# Patient Record
Sex: Female | Born: 1940 | Race: White | Hispanic: No | Smoking: Never smoker
Health system: Southern US, Community
[De-identification: ages and names within clinical notes are randomized; demographics above are authoritative.]

## PROBLEM LIST (undated history)

## (undated) DIAGNOSIS — Z860100 Personal history of colon polyps, unspecified: Secondary | ICD-10-CM

## (undated) DIAGNOSIS — N200 Calculus of kidney: Secondary | ICD-10-CM

## (undated) DIAGNOSIS — K121 Other forms of stomatitis: Secondary | ICD-10-CM

## (undated) DIAGNOSIS — M1990 Unspecified osteoarthritis, unspecified site: Secondary | ICD-10-CM

## (undated) DIAGNOSIS — N183 Chronic kidney disease, stage 3 (moderate): Secondary | ICD-10-CM

## (undated) DIAGNOSIS — K5790 Diverticulosis of intestine, part unspecified, without perforation or abscess without bleeding: Secondary | ICD-10-CM

## (undated) DIAGNOSIS — E785 Hyperlipidemia, unspecified: Secondary | ICD-10-CM

## (undated) DIAGNOSIS — E119 Type 2 diabetes mellitus without complications: Secondary | ICD-10-CM

## (undated) DIAGNOSIS — I509 Heart failure, unspecified: Secondary | ICD-10-CM

## (undated) DIAGNOSIS — I10 Essential (primary) hypertension: Secondary | ICD-10-CM

## (undated) DIAGNOSIS — I071 Rheumatic tricuspid insufficiency: Secondary | ICD-10-CM

## (undated) DIAGNOSIS — M48061 Spinal stenosis, lumbar region without neurogenic claudication: Secondary | ICD-10-CM

## (undated) DIAGNOSIS — I739 Peripheral vascular disease, unspecified: Secondary | ICD-10-CM

## (undated) DIAGNOSIS — K5730 Diverticulosis of large intestine without perforation or abscess without bleeding: Secondary | ICD-10-CM

## (undated) DIAGNOSIS — I219 Acute myocardial infarction, unspecified: Secondary | ICD-10-CM

## (undated) DIAGNOSIS — M8580 Other specified disorders of bone density and structure, unspecified site: Secondary | ICD-10-CM

## (undated) DIAGNOSIS — L409 Psoriasis, unspecified: Secondary | ICD-10-CM

## (undated) DIAGNOSIS — I252 Old myocardial infarction: Secondary | ICD-10-CM

## (undated) DIAGNOSIS — I2510 Atherosclerotic heart disease of native coronary artery without angina pectoris: Secondary | ICD-10-CM

## (undated) DIAGNOSIS — N184 Chronic kidney disease, stage 4 (severe): Secondary | ICD-10-CM

## (undated) DIAGNOSIS — M654 Radial styloid tenosynovitis [de Quervain]: Secondary | ICD-10-CM

## (undated) HISTORY — DX: Type 2 diabetes mellitus without complications: E11.9

## (undated) HISTORY — DX: Calculus of kidney: N20.0

## (undated) HISTORY — DX: Diverticulosis of large intestine without perforation or abscess without bleeding: K57.30

## (undated) HISTORY — DX: Essential (primary) hypertension: I10

## (undated) HISTORY — DX: Personal history of colonic polyps: Z86.010

## (undated) HISTORY — PX: LAPAROSCOPIC SUPRACERVICAL HYSTERECTOMY: SUR797

## (undated) HISTORY — DX: Old myocardial infarction: I25.2

## (undated) HISTORY — DX: Peripheral vascular disease, unspecified: I73.9

## (undated) HISTORY — DX: Acute myocardial infarction, unspecified: I21.9

## (undated) HISTORY — PX: BREAST BIOPSY: SHX20

## (undated) HISTORY — DX: Diverticulosis of intestine, part unspecified, without perforation or abscess without bleeding: K57.90

## (undated) HISTORY — DX: Personal history of colon polyps, unspecified: Z86.0100

## (undated) HISTORY — PX: BYPASS GRAFT ANGIOGRAPHY: CATH118229

## (undated) HISTORY — DX: Rheumatic tricuspid insufficiency: I07.1

## (undated) HISTORY — PX: ABDOMINAL HYSTERECTOMY: SHX81

## (undated) HISTORY — DX: Chronic kidney disease, stage 3 (moderate): N18.3

## (undated) HISTORY — DX: Atherosclerotic heart disease of native coronary artery without angina pectoris: I25.10

## (undated) HISTORY — DX: Other specified disorders of bone density and structure, unspecified site: M85.80

## (undated) HISTORY — DX: Hyperlipidemia, unspecified: E78.5

## (undated) HISTORY — DX: Chronic kidney disease, stage 4 (severe): N18.4

## (undated) HISTORY — DX: Other forms of stomatitis: K12.1

## (undated) HISTORY — DX: Heart failure, unspecified: I50.9

## (undated) HISTORY — DX: Unspecified osteoarthritis, unspecified site: M19.90

## (undated) HISTORY — DX: Spinal stenosis, lumbar region without neurogenic claudication: M48.061

## (undated) HISTORY — DX: Psoriasis, unspecified: L40.9

---
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
## 1994-04-14 ENCOUNTER — Encounter (INDEPENDENT_AMBULATORY_CARE_PROVIDER_SITE_OTHER): Payer: Self-pay | Admitting: Internal Medicine

## 1997-05-18 ENCOUNTER — Encounter: Admission: RE | Admit: 1997-05-18 | Discharge: 1997-05-18 | Payer: Self-pay | Admitting: Hematology and Oncology

## 1998-02-21 ENCOUNTER — Encounter: Admission: RE | Admit: 1998-02-21 | Discharge: 1998-02-21 | Payer: Self-pay | Admitting: Internal Medicine

## 1998-03-26 ENCOUNTER — Ambulatory Visit (HOSPITAL_COMMUNITY): Admission: RE | Admit: 1998-03-26 | Discharge: 1998-03-26 | Payer: Self-pay | Admitting: Internal Medicine

## 1998-03-26 ENCOUNTER — Encounter: Payer: Self-pay | Admitting: Internal Medicine

## 1999-06-11 ENCOUNTER — Encounter: Admission: RE | Admit: 1999-06-11 | Discharge: 1999-06-11 | Payer: Self-pay | Admitting: Internal Medicine

## 2000-08-05 ENCOUNTER — Encounter: Admission: RE | Admit: 2000-08-05 | Discharge: 2000-08-05 | Payer: Self-pay | Admitting: Internal Medicine

## 2000-08-27 ENCOUNTER — Ambulatory Visit (HOSPITAL_COMMUNITY): Admission: RE | Admit: 2000-08-27 | Discharge: 2000-08-27 | Payer: Self-pay | Admitting: Internal Medicine

## 2000-08-27 ENCOUNTER — Encounter: Payer: Self-pay | Admitting: Internal Medicine

## 2001-01-13 DIAGNOSIS — I219 Acute myocardial infarction, unspecified: Secondary | ICD-10-CM

## 2001-01-13 HISTORY — DX: Acute myocardial infarction, unspecified: I21.9

## 2001-01-13 HISTORY — PX: CORONARY ANGIOPLASTY WITH STENT PLACEMENT: SHX49

## 2001-09-15 ENCOUNTER — Encounter: Admission: RE | Admit: 2001-09-15 | Discharge: 2001-09-15 | Payer: Self-pay | Admitting: Internal Medicine

## 2001-10-04 ENCOUNTER — Encounter: Admission: RE | Admit: 2001-10-04 | Discharge: 2001-10-04 | Payer: Self-pay | Admitting: Internal Medicine

## 2001-10-18 ENCOUNTER — Encounter: Admission: RE | Admit: 2001-10-18 | Discharge: 2001-10-18 | Payer: Self-pay | Admitting: Internal Medicine

## 2001-11-11 ENCOUNTER — Ambulatory Visit (HOSPITAL_COMMUNITY): Admission: RE | Admit: 2001-11-11 | Discharge: 2001-11-11 | Payer: Self-pay | Admitting: Internal Medicine

## 2001-12-01 ENCOUNTER — Encounter: Admission: RE | Admit: 2001-12-01 | Discharge: 2001-12-01 | Payer: Self-pay | Admitting: Internal Medicine

## 2002-02-02 ENCOUNTER — Encounter: Admission: RE | Admit: 2002-02-02 | Discharge: 2002-02-02 | Payer: Self-pay | Admitting: Internal Medicine

## 2002-04-04 ENCOUNTER — Encounter: Admission: RE | Admit: 2002-04-04 | Discharge: 2002-04-04 | Payer: Self-pay | Admitting: Internal Medicine

## 2002-05-30 ENCOUNTER — Encounter: Admission: RE | Admit: 2002-05-30 | Discharge: 2002-05-30 | Payer: Self-pay | Admitting: Internal Medicine

## 2002-05-31 ENCOUNTER — Ambulatory Visit (HOSPITAL_COMMUNITY): Admission: RE | Admit: 2002-05-31 | Discharge: 2002-05-31 | Payer: Self-pay | Admitting: Internal Medicine

## 2002-08-01 ENCOUNTER — Encounter: Admission: RE | Admit: 2002-08-01 | Discharge: 2002-08-01 | Payer: Self-pay | Admitting: Internal Medicine

## 2002-09-12 ENCOUNTER — Encounter: Admission: RE | Admit: 2002-09-12 | Discharge: 2002-09-12 | Payer: Self-pay | Admitting: Internal Medicine

## 2002-10-19 ENCOUNTER — Encounter: Admission: RE | Admit: 2002-10-19 | Discharge: 2002-10-19 | Payer: Self-pay | Admitting: Internal Medicine

## 2003-01-19 ENCOUNTER — Encounter: Admission: RE | Admit: 2003-01-19 | Discharge: 2003-01-19 | Payer: Self-pay | Admitting: Internal Medicine

## 2003-04-21 ENCOUNTER — Encounter: Admission: RE | Admit: 2003-04-21 | Discharge: 2003-04-21 | Payer: Self-pay | Admitting: Internal Medicine

## 2003-06-28 ENCOUNTER — Ambulatory Visit (HOSPITAL_COMMUNITY): Admission: RE | Admit: 2003-06-28 | Discharge: 2003-06-28 | Payer: Self-pay | Admitting: Internal Medicine

## 2003-08-21 ENCOUNTER — Encounter: Admission: RE | Admit: 2003-08-21 | Discharge: 2003-08-21 | Payer: Self-pay | Admitting: Internal Medicine

## 2003-08-24 ENCOUNTER — Encounter: Admission: RE | Admit: 2003-08-24 | Discharge: 2003-08-24 | Payer: Self-pay | Admitting: Internal Medicine

## 2003-09-07 ENCOUNTER — Encounter (INDEPENDENT_AMBULATORY_CARE_PROVIDER_SITE_OTHER): Payer: Self-pay | Admitting: Internal Medicine

## 2003-09-07 ENCOUNTER — Encounter: Admission: RE | Admit: 2003-09-07 | Discharge: 2003-09-07 | Payer: Self-pay | Admitting: Internal Medicine

## 2003-10-04 ENCOUNTER — Ambulatory Visit: Payer: Self-pay | Admitting: Internal Medicine

## 2003-11-20 ENCOUNTER — Ambulatory Visit: Payer: Self-pay | Admitting: Internal Medicine

## 2004-01-14 DIAGNOSIS — N183 Chronic kidney disease, stage 3 (moderate): Secondary | ICD-10-CM

## 2004-01-14 DIAGNOSIS — E1122 Type 2 diabetes mellitus with diabetic chronic kidney disease: Secondary | ICD-10-CM

## 2004-01-14 DIAGNOSIS — I2510 Atherosclerotic heart disease of native coronary artery without angina pectoris: Secondary | ICD-10-CM

## 2004-01-14 DIAGNOSIS — E118 Type 2 diabetes mellitus with unspecified complications: Secondary | ICD-10-CM

## 2004-01-14 DIAGNOSIS — Z794 Long term (current) use of insulin: Secondary | ICD-10-CM

## 2004-01-14 HISTORY — DX: Type 2 diabetes mellitus with diabetic chronic kidney disease: E11.22

## 2004-01-14 HISTORY — DX: Atherosclerotic heart disease of native coronary artery without angina pectoris: I25.10

## 2004-01-14 HISTORY — PX: CORONARY ARTERY BYPASS GRAFT: SHX141

## 2004-01-14 HISTORY — DX: Type 2 diabetes mellitus with unspecified complications: E11.8

## 2004-02-26 ENCOUNTER — Ambulatory Visit: Payer: Self-pay | Admitting: Internal Medicine

## 2004-02-26 ENCOUNTER — Ambulatory Visit (HOSPITAL_COMMUNITY): Admission: RE | Admit: 2004-02-26 | Discharge: 2004-02-26 | Payer: Self-pay | Admitting: Internal Medicine

## 2004-03-01 ENCOUNTER — Ambulatory Visit (HOSPITAL_COMMUNITY): Admission: RE | Admit: 2004-03-01 | Discharge: 2004-03-01 | Payer: Self-pay | Admitting: Cardiovascular Disease

## 2004-03-22 ENCOUNTER — Ambulatory Visit: Payer: Self-pay | Admitting: Internal Medicine

## 2004-03-26 ENCOUNTER — Ambulatory Visit: Payer: Self-pay | Admitting: Internal Medicine

## 2004-06-28 ENCOUNTER — Ambulatory Visit (HOSPITAL_COMMUNITY): Admission: RE | Admit: 2004-06-28 | Discharge: 2004-06-28 | Payer: Self-pay | Admitting: Internal Medicine

## 2004-07-01 ENCOUNTER — Ambulatory Visit: Payer: Self-pay | Admitting: Internal Medicine

## 2004-08-27 ENCOUNTER — Ambulatory Visit: Payer: Self-pay | Admitting: Internal Medicine

## 2004-09-10 ENCOUNTER — Ambulatory Visit: Payer: Self-pay | Admitting: Internal Medicine

## 2004-10-22 ENCOUNTER — Ambulatory Visit: Payer: Self-pay | Admitting: Hospitalist

## 2004-12-13 ENCOUNTER — Inpatient Hospital Stay (HOSPITAL_COMMUNITY): Admission: RE | Admit: 2004-12-13 | Discharge: 2004-12-20 | Payer: Self-pay | Admitting: Cardiovascular Disease

## 2004-12-13 ENCOUNTER — Ambulatory Visit: Payer: Self-pay | Admitting: Hospitalist

## 2005-01-16 ENCOUNTER — Ambulatory Visit (HOSPITAL_COMMUNITY): Admission: RE | Admit: 2005-01-16 | Discharge: 2005-01-16 | Payer: Self-pay | Admitting: Cardiothoracic Surgery

## 2005-01-16 ENCOUNTER — Encounter: Admission: RE | Admit: 2005-01-16 | Discharge: 2005-01-16 | Payer: Self-pay | Admitting: Cardiothoracic Surgery

## 2005-01-23 ENCOUNTER — Encounter: Admission: RE | Admit: 2005-01-23 | Discharge: 2005-01-23 | Payer: Self-pay | Admitting: Cardiothoracic Surgery

## 2005-02-06 ENCOUNTER — Encounter: Admission: RE | Admit: 2005-02-06 | Discharge: 2005-02-06 | Payer: Self-pay | Admitting: Cardiothoracic Surgery

## 2005-02-24 ENCOUNTER — Ambulatory Visit: Payer: Self-pay | Admitting: Internal Medicine

## 2005-03-13 ENCOUNTER — Encounter: Admission: RE | Admit: 2005-03-13 | Discharge: 2005-03-13 | Payer: Self-pay | Admitting: Cardiothoracic Surgery

## 2005-07-02 ENCOUNTER — Ambulatory Visit: Payer: Self-pay | Admitting: Internal Medicine

## 2005-07-08 ENCOUNTER — Ambulatory Visit: Payer: Self-pay | Admitting: Internal Medicine

## 2005-08-05 ENCOUNTER — Ambulatory Visit (HOSPITAL_COMMUNITY): Admission: RE | Admit: 2005-08-05 | Discharge: 2005-08-05 | Payer: Self-pay | Admitting: Cardiothoracic Surgery

## 2005-08-05 ENCOUNTER — Encounter (INDEPENDENT_AMBULATORY_CARE_PROVIDER_SITE_OTHER): Payer: Self-pay | Admitting: Internal Medicine

## 2005-10-07 ENCOUNTER — Ambulatory Visit: Payer: Self-pay | Admitting: Internal Medicine

## 2005-10-13 ENCOUNTER — Ambulatory Visit: Payer: Self-pay | Admitting: Internal Medicine

## 2005-10-15 ENCOUNTER — Ambulatory Visit (HOSPITAL_COMMUNITY): Admission: RE | Admit: 2005-10-15 | Discharge: 2005-10-15 | Payer: Self-pay | Admitting: Internal Medicine

## 2005-10-15 DIAGNOSIS — E669 Obesity, unspecified: Secondary | ICD-10-CM

## 2005-10-15 DIAGNOSIS — E785 Hyperlipidemia, unspecified: Secondary | ICD-10-CM

## 2005-10-15 DIAGNOSIS — L409 Psoriasis, unspecified: Secondary | ICD-10-CM | POA: Insufficient documentation

## 2005-10-15 DIAGNOSIS — N259 Disorder resulting from impaired renal tubular function, unspecified: Secondary | ICD-10-CM | POA: Insufficient documentation

## 2005-10-15 DIAGNOSIS — M810 Age-related osteoporosis without current pathological fracture: Secondary | ICD-10-CM | POA: Insufficient documentation

## 2005-10-15 DIAGNOSIS — M8580 Other specified disorders of bone density and structure, unspecified site: Secondary | ICD-10-CM

## 2005-10-15 DIAGNOSIS — Z951 Presence of aortocoronary bypass graft: Secondary | ICD-10-CM

## 2005-10-15 DIAGNOSIS — Z9079 Acquired absence of other genital organ(s): Secondary | ICD-10-CM | POA: Insufficient documentation

## 2005-10-15 DIAGNOSIS — I739 Peripheral vascular disease, unspecified: Secondary | ICD-10-CM

## 2005-10-15 HISTORY — DX: Hyperlipidemia, unspecified: E78.5

## 2005-12-23 DIAGNOSIS — M47815 Spondylosis without myelopathy or radiculopathy, thoracolumbar region: Secondary | ICD-10-CM

## 2006-01-13 HISTORY — PX: CATARACT EXTRACTION: SUR2

## 2006-03-03 ENCOUNTER — Ambulatory Visit: Payer: Self-pay | Admitting: Internal Medicine

## 2006-03-03 ENCOUNTER — Ambulatory Visit (HOSPITAL_COMMUNITY): Admission: RE | Admit: 2006-03-03 | Discharge: 2006-03-03 | Payer: Self-pay | Admitting: Internal Medicine

## 2006-03-03 DIAGNOSIS — M25559 Pain in unspecified hip: Secondary | ICD-10-CM

## 2006-03-03 DIAGNOSIS — R1310 Dysphagia, unspecified: Secondary | ICD-10-CM | POA: Insufficient documentation

## 2006-03-03 LAB — CONVERTED CEMR LAB
CO2: 30 meq/L (ref 19–32)
Calcium: 10 mg/dL (ref 8.4–10.5)
Creatinine, Ser: 1.56 mg/dL — ABNORMAL HIGH (ref 0.40–1.20)
Glucose, Bld: 221 mg/dL
Hgb A1c MFr Bld: 8 %
Sodium: 141 meq/L (ref 135–145)

## 2006-03-03 IMAGING — CR DG HIP W/ PELVIS BILAT
3 series · 3 of 3 positions shown · non-contrast
Comparison: None.

CLINICAL DATA: Pain in hips when walking for a couple of months.
 BILATERAL HIPS W/PELVIS ? 3 VIEW:

[t pelvis a.p.]
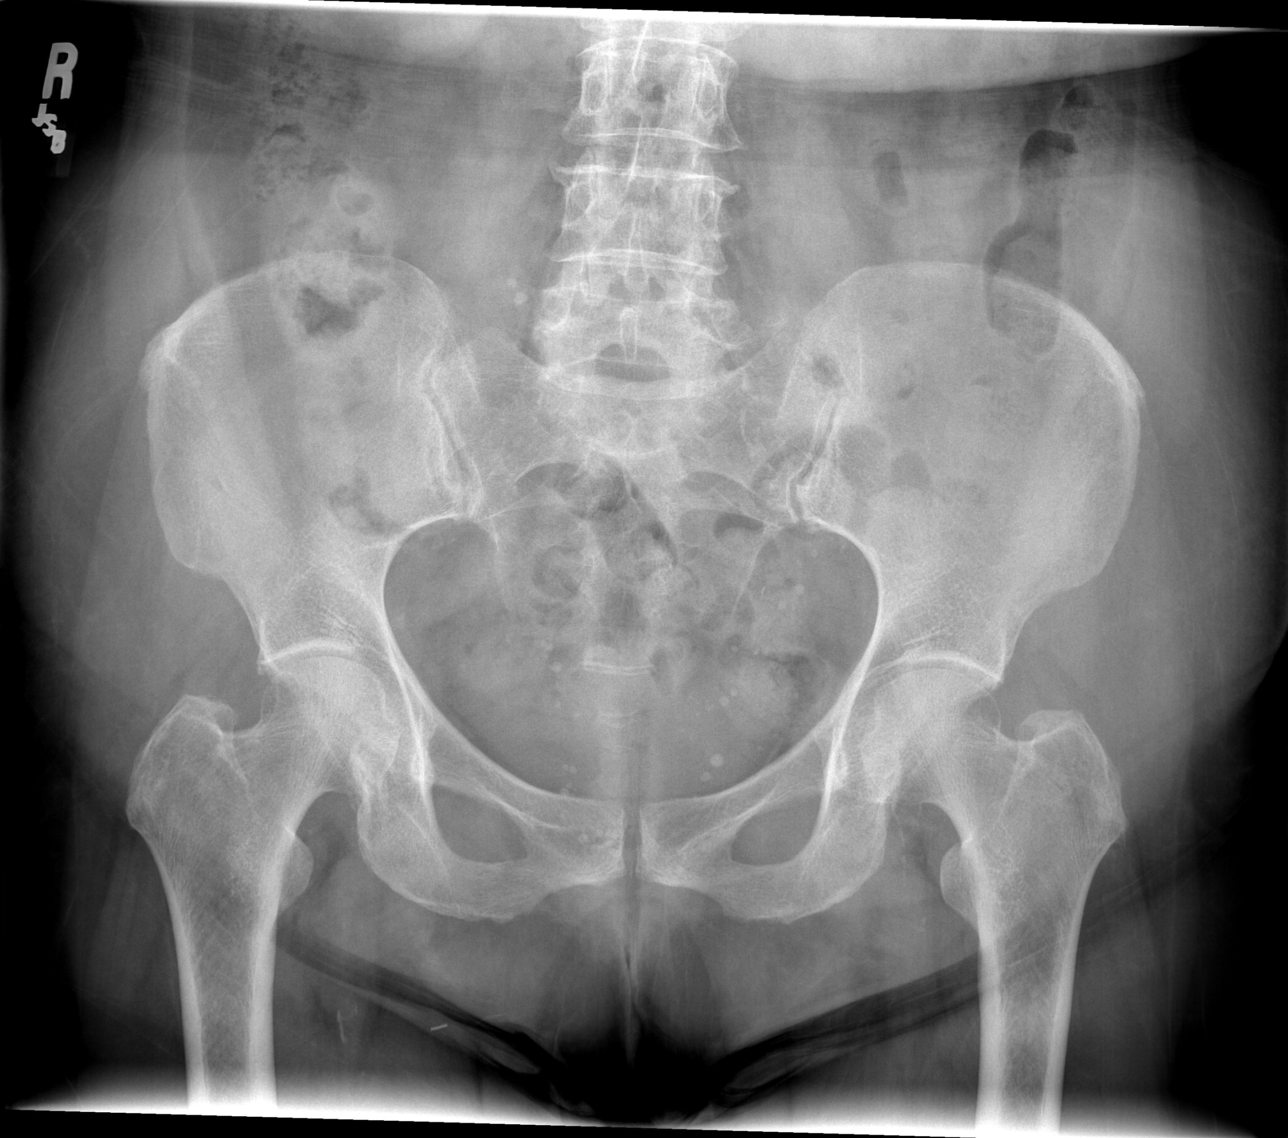

[t hip frog leg right]
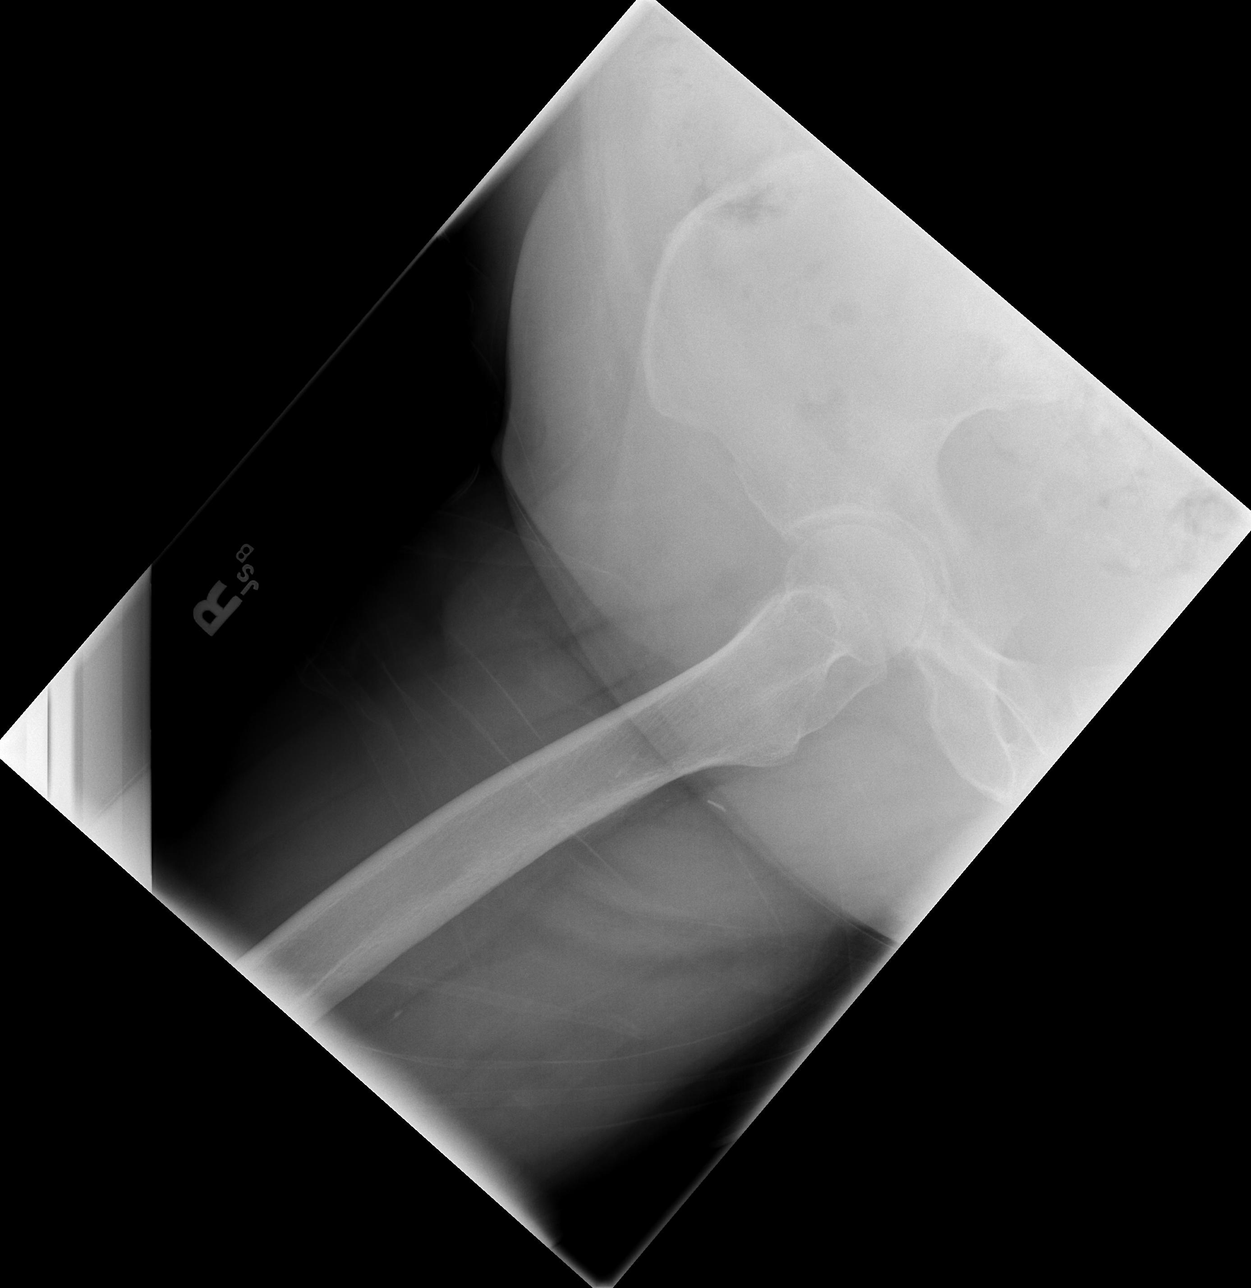

[t hip frog leg left]
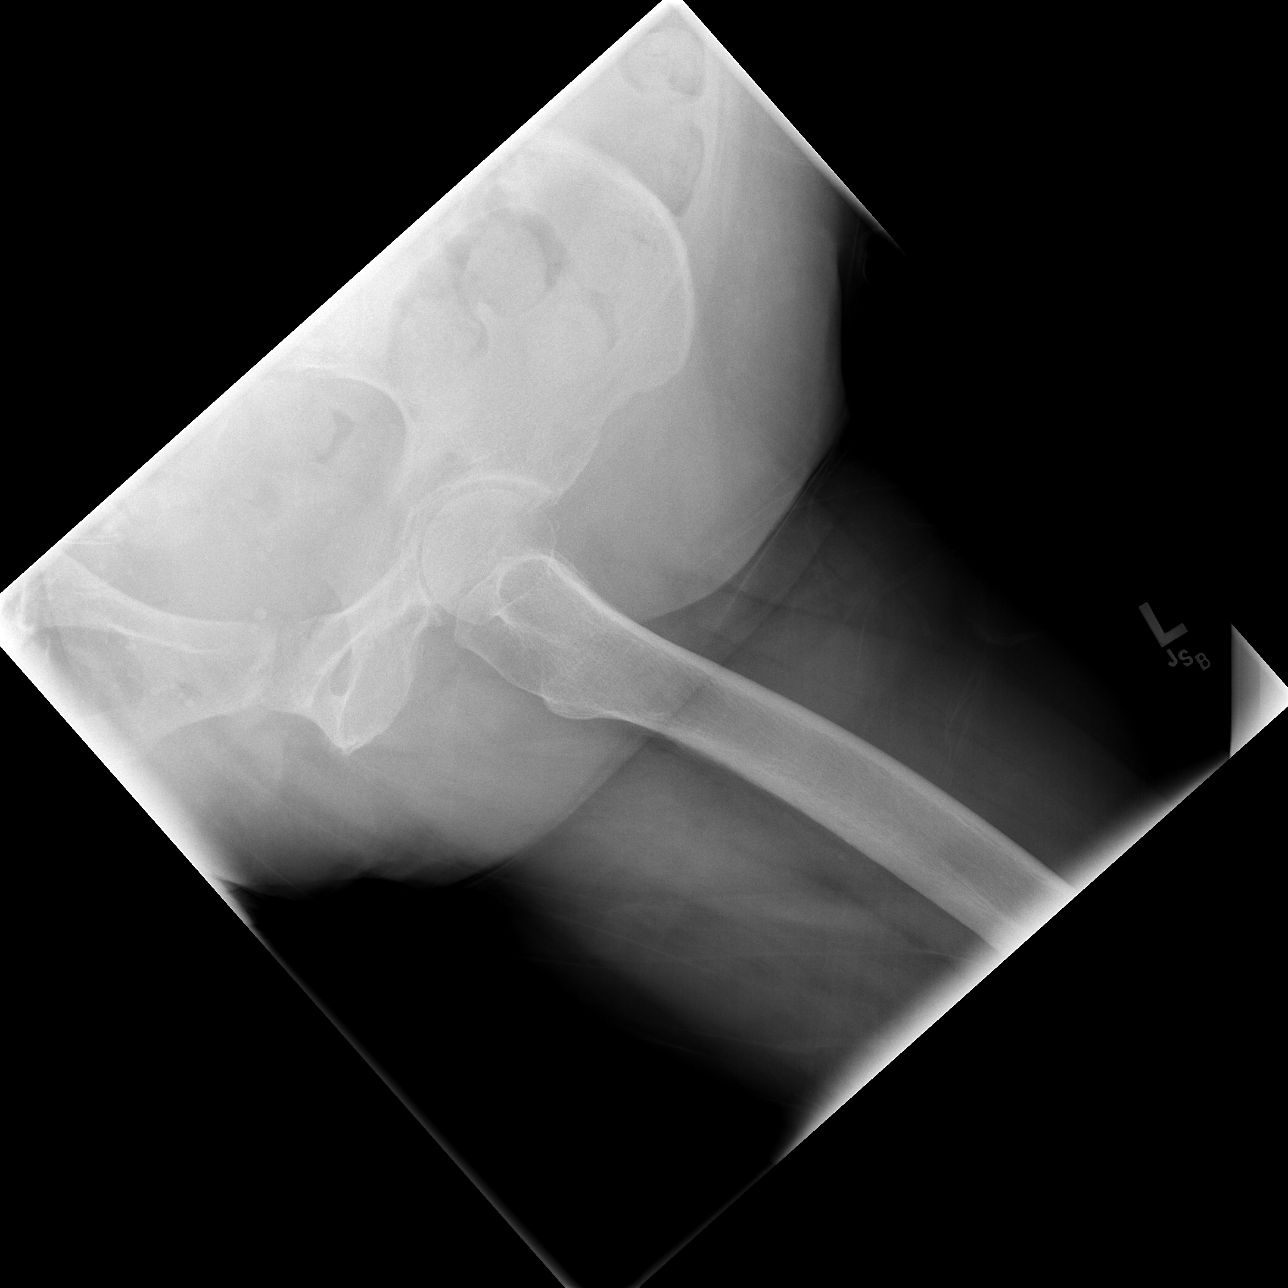

[3 of 3 positions shown; findings below may reference images not displayed]

FINDINGS: There is no evidence of hip fracture or dislocation.  There is no evidence of arthropathy or other focal bone abnormality involving the hip or pelvis.
IMPRESSION: Negative.

## 2006-03-06 ENCOUNTER — Telehealth (INDEPENDENT_AMBULATORY_CARE_PROVIDER_SITE_OTHER): Payer: Self-pay | Admitting: Internal Medicine

## 2006-03-13 ENCOUNTER — Ambulatory Visit: Payer: Self-pay | Admitting: Internal Medicine

## 2006-03-19 ENCOUNTER — Encounter (INDEPENDENT_AMBULATORY_CARE_PROVIDER_SITE_OTHER): Payer: Self-pay | Admitting: Internal Medicine

## 2006-03-19 ENCOUNTER — Ambulatory Visit: Payer: Self-pay | Admitting: Internal Medicine

## 2006-03-31 ENCOUNTER — Telehealth: Payer: Self-pay | Admitting: *Deleted

## 2006-04-09 ENCOUNTER — Ambulatory Visit: Payer: Self-pay | Admitting: *Deleted

## 2006-04-10 ENCOUNTER — Encounter (INDEPENDENT_AMBULATORY_CARE_PROVIDER_SITE_OTHER): Payer: Self-pay | Admitting: Internal Medicine

## 2006-04-15 ENCOUNTER — Encounter (INDEPENDENT_AMBULATORY_CARE_PROVIDER_SITE_OTHER): Payer: Self-pay | Admitting: Internal Medicine

## 2006-04-15 ENCOUNTER — Telehealth (INDEPENDENT_AMBULATORY_CARE_PROVIDER_SITE_OTHER): Payer: Self-pay | Admitting: *Deleted

## 2006-04-22 ENCOUNTER — Ambulatory Visit: Payer: Self-pay | Admitting: Internal Medicine

## 2006-04-22 LAB — CONVERTED CEMR LAB: Blood Glucose, Fingerstick: 178

## 2006-04-24 ENCOUNTER — Encounter (INDEPENDENT_AMBULATORY_CARE_PROVIDER_SITE_OTHER): Payer: Self-pay | Admitting: Internal Medicine

## 2006-05-25 ENCOUNTER — Ambulatory Visit: Payer: Self-pay | Admitting: Hospitalist

## 2006-05-25 ENCOUNTER — Encounter (INDEPENDENT_AMBULATORY_CARE_PROVIDER_SITE_OTHER): Payer: Self-pay | Admitting: Internal Medicine

## 2006-05-25 LAB — CONVERTED CEMR LAB
AST: 19 units/L (ref 0–37)
Alkaline Phosphatase: 61 units/L (ref 39–117)
Glucose, Bld: 217 mg/dL — ABNORMAL HIGH (ref 70–99)
Hgb A1c MFr Bld: 7.4 %
Sodium: 143 meq/L (ref 135–145)
Total Bilirubin: 0.5 mg/dL (ref 0.3–1.2)
Total Protein: 6.8 g/dL (ref 6.0–8.3)

## 2006-06-03 ENCOUNTER — Ambulatory Visit: Payer: Self-pay | Admitting: Internal Medicine

## 2006-06-12 ENCOUNTER — Ambulatory Visit: Payer: Self-pay | Admitting: Internal Medicine

## 2006-06-12 LAB — CONVERTED CEMR LAB
ALT: 14 U/L
AST: 17 U/L
Albumin: 4.4 g/dL
Alkaline Phosphatase: 66 U/L
BUN: 19 mg/dL
CO2: 29 meq/L
Calcium: 9.8 mg/dL
Chloride: 102 meq/L
Cholesterol: 163 mg/dL
Creatinine, Ser: 1.27 mg/dL — ABNORMAL HIGH
Glucose, Bld: 140 mg/dL — ABNORMAL HIGH
HDL: 45 mg/dL
LDL Cholesterol: 95 mg/dL
Potassium: 4.7 meq/L
Sodium: 142 meq/L
Total Bilirubin: 0.6 mg/dL
Total CHOL/HDL Ratio: 3.6
Total Protein: 7.1 g/dL
Triglycerides: 113 mg/dL
VLDL: 23 mg/dL

## 2006-08-19 ENCOUNTER — Telehealth (INDEPENDENT_AMBULATORY_CARE_PROVIDER_SITE_OTHER): Payer: Self-pay | Admitting: Pharmacy Technician

## 2006-08-27 ENCOUNTER — Encounter (INDEPENDENT_AMBULATORY_CARE_PROVIDER_SITE_OTHER): Payer: Self-pay | Admitting: *Deleted

## 2006-08-27 ENCOUNTER — Ambulatory Visit (HOSPITAL_COMMUNITY): Admission: RE | Admit: 2006-08-27 | Discharge: 2006-08-27 | Payer: Self-pay | Admitting: Gynecology

## 2006-08-31 ENCOUNTER — Encounter: Admission: RE | Admit: 2006-08-31 | Discharge: 2006-08-31 | Payer: Self-pay | Admitting: Cardiovascular Disease

## 2006-09-09 ENCOUNTER — Ambulatory Visit (HOSPITAL_COMMUNITY): Admission: RE | Admit: 2006-09-09 | Discharge: 2006-09-09 | Payer: Self-pay | Admitting: Internal Medicine

## 2006-09-09 ENCOUNTER — Ambulatory Visit: Payer: Self-pay | Admitting: Internal Medicine

## 2006-09-09 DIAGNOSIS — I252 Old myocardial infarction: Secondary | ICD-10-CM

## 2006-09-09 DIAGNOSIS — R059 Cough, unspecified: Secondary | ICD-10-CM | POA: Insufficient documentation

## 2006-09-09 DIAGNOSIS — K5900 Constipation, unspecified: Secondary | ICD-10-CM | POA: Insufficient documentation

## 2006-09-09 DIAGNOSIS — R05 Cough: Secondary | ICD-10-CM

## 2006-09-09 DIAGNOSIS — I10 Essential (primary) hypertension: Secondary | ICD-10-CM

## 2006-09-09 HISTORY — DX: Old myocardial infarction: I25.2

## 2006-09-09 LAB — CONVERTED CEMR LAB
AST: 13 units/L (ref 0–37)
Alkaline Phosphatase: 62 units/L (ref 39–117)
BUN: 18 mg/dL (ref 6–23)
Basophils Relative: 0 % (ref 0–1)
Calcium: 10.3 mg/dL (ref 8.4–10.5)
Creatinine, Ser: 1.3 mg/dL — ABNORMAL HIGH (ref 0.40–1.20)
Eosinophils Absolute: 0.3 10*3/uL (ref 0.0–0.7)
Eosinophils Relative: 4 % (ref 0–5)
Hemoglobin: 13.3 g/dL (ref 12.0–15.0)
MCHC: 33.2 g/dL (ref 30.0–36.0)
MCV: 92.2 fL (ref 78.0–100.0)
Monocytes Absolute: 0.6 10*3/uL (ref 0.2–0.7)
Monocytes Relative: 8 % (ref 3–11)
RBC: 4.35 M/uL (ref 3.87–5.11)

## 2006-09-22 ENCOUNTER — Telehealth: Payer: Self-pay | Admitting: *Deleted

## 2006-09-28 ENCOUNTER — Encounter (INDEPENDENT_AMBULATORY_CARE_PROVIDER_SITE_OTHER): Payer: Self-pay | Admitting: *Deleted

## 2006-09-28 ENCOUNTER — Ambulatory Visit: Payer: Self-pay | Admitting: Internal Medicine

## 2006-09-29 ENCOUNTER — Telehealth (INDEPENDENT_AMBULATORY_CARE_PROVIDER_SITE_OTHER): Payer: Self-pay | Admitting: *Deleted

## 2006-10-19 ENCOUNTER — Telehealth: Payer: Self-pay | Admitting: *Deleted

## 2006-10-26 ENCOUNTER — Ambulatory Visit: Payer: Self-pay | Admitting: Infectious Diseases

## 2006-10-26 ENCOUNTER — Encounter (INDEPENDENT_AMBULATORY_CARE_PROVIDER_SITE_OTHER): Payer: Self-pay | Admitting: *Deleted

## 2006-10-26 LAB — CONVERTED CEMR LAB
BUN: 22 mg/dL (ref 6–23)
Chloride: 101 meq/L (ref 96–112)
Glucose, Bld: 177 mg/dL — ABNORMAL HIGH (ref 70–99)
Potassium: 4.4 meq/L (ref 3.5–5.3)

## 2006-11-06 ENCOUNTER — Ambulatory Visit: Payer: Self-pay | Admitting: Internal Medicine

## 2006-11-06 ENCOUNTER — Encounter: Payer: Self-pay | Admitting: Internal Medicine

## 2006-11-06 ENCOUNTER — Encounter (INDEPENDENT_AMBULATORY_CARE_PROVIDER_SITE_OTHER): Payer: Self-pay | Admitting: *Deleted

## 2006-12-21 ENCOUNTER — Encounter (INDEPENDENT_AMBULATORY_CARE_PROVIDER_SITE_OTHER): Payer: Self-pay | Admitting: *Deleted

## 2006-12-21 ENCOUNTER — Ambulatory Visit: Payer: Self-pay | Admitting: Internal Medicine

## 2006-12-21 LAB — CONVERTED CEMR LAB
CO2: 27 meq/L (ref 19–32)
Chloride: 104 meq/L (ref 96–112)
Creatinine, Ser: 1.53 mg/dL — ABNORMAL HIGH (ref 0.40–1.20)
Hgb A1c MFr Bld: 7.3 %

## 2007-01-14 DIAGNOSIS — I071 Rheumatic tricuspid insufficiency: Secondary | ICD-10-CM

## 2007-01-14 HISTORY — DX: Rheumatic tricuspid insufficiency: I07.1

## 2007-02-25 ENCOUNTER — Ambulatory Visit: Payer: Self-pay | Admitting: Internal Medicine

## 2007-03-26 ENCOUNTER — Ambulatory Visit: Payer: Self-pay | Admitting: *Deleted

## 2007-03-26 ENCOUNTER — Encounter (INDEPENDENT_AMBULATORY_CARE_PROVIDER_SITE_OTHER): Payer: Self-pay | Admitting: *Deleted

## 2007-03-26 LAB — CONVERTED CEMR LAB
CO2: 29 meq/L (ref 19–32)
Chloride: 105 meq/L (ref 96–112)
Creatinine, Ser: 1.18 mg/dL (ref 0.40–1.20)
HDL: 50 mg/dL (ref >39)
LDL Cholesterol: 89 mg/dL (ref 0–99)
Microalb Creat Ratio: 12.8 mg/g (ref 0.0–30.0)
Total CHOL/HDL Ratio: 3.5
VLDL: 36 mg/dL (ref 0–40)

## 2007-04-01 DIAGNOSIS — N200 Calculus of kidney: Secondary | ICD-10-CM | POA: Insufficient documentation

## 2007-04-01 DIAGNOSIS — Z860100 Personal history of colon polyps, unspecified: Secondary | ICD-10-CM | POA: Insufficient documentation

## 2007-04-01 DIAGNOSIS — K5730 Diverticulosis of large intestine without perforation or abscess without bleeding: Secondary | ICD-10-CM | POA: Insufficient documentation

## 2007-04-01 HISTORY — DX: Calculus of kidney: N20.0

## 2007-04-01 HISTORY — DX: Diverticulosis of large intestine without perforation or abscess without bleeding: K57.30

## 2007-04-15 ENCOUNTER — Ambulatory Visit: Payer: Self-pay | Admitting: Internal Medicine

## 2007-04-21 ENCOUNTER — Encounter (INDEPENDENT_AMBULATORY_CARE_PROVIDER_SITE_OTHER): Payer: Self-pay | Admitting: Hospitalist

## 2007-04-21 ENCOUNTER — Ambulatory Visit (HOSPITAL_COMMUNITY): Admission: RE | Admit: 2007-04-21 | Discharge: 2007-04-21 | Payer: Self-pay | Admitting: Hospitalist

## 2007-04-21 ENCOUNTER — Encounter (INDEPENDENT_AMBULATORY_CARE_PROVIDER_SITE_OTHER): Payer: Self-pay | Admitting: *Deleted

## 2007-04-21 ENCOUNTER — Ambulatory Visit: Payer: Self-pay | Admitting: *Deleted

## 2007-05-20 ENCOUNTER — Telehealth (INDEPENDENT_AMBULATORY_CARE_PROVIDER_SITE_OTHER): Payer: Self-pay | Admitting: *Deleted

## 2007-06-01 ENCOUNTER — Ambulatory Visit: Payer: Self-pay | Admitting: Vascular Surgery

## 2007-06-14 ENCOUNTER — Ambulatory Visit: Payer: Self-pay | Admitting: *Deleted

## 2007-06-14 LAB — CONVERTED CEMR LAB: Blood Glucose, Fingerstick: 253

## 2007-06-21 ENCOUNTER — Ambulatory Visit (HOSPITAL_COMMUNITY): Admission: RE | Admit: 2007-06-21 | Discharge: 2007-06-21 | Payer: Self-pay | Admitting: *Deleted

## 2007-07-06 ENCOUNTER — Inpatient Hospital Stay (HOSPITAL_COMMUNITY): Admission: EM | Admit: 2007-07-06 | Discharge: 2007-07-08 | Payer: Self-pay | Admitting: Emergency Medicine

## 2007-07-07 ENCOUNTER — Encounter (INDEPENDENT_AMBULATORY_CARE_PROVIDER_SITE_OTHER): Payer: Self-pay | Admitting: Cardiovascular Disease

## 2007-07-23 ENCOUNTER — Ambulatory Visit: Payer: Self-pay | Admitting: Internal Medicine

## 2007-07-23 ENCOUNTER — Encounter (INDEPENDENT_AMBULATORY_CARE_PROVIDER_SITE_OTHER): Payer: Self-pay | Admitting: *Deleted

## 2007-07-23 DIAGNOSIS — M48061 Spinal stenosis, lumbar region without neurogenic claudication: Secondary | ICD-10-CM | POA: Insufficient documentation

## 2007-07-23 LAB — CONVERTED CEMR LAB
BUN: 25 mg/dL — ABNORMAL HIGH (ref 6–23)
Calcium: 9.7 mg/dL (ref 8.4–10.5)
Creatinine, Ser: 1.25 mg/dL — ABNORMAL HIGH (ref 0.40–1.20)
Glucose, Bld: 141 mg/dL — ABNORMAL HIGH (ref 70–99)

## 2007-08-04 ENCOUNTER — Encounter (INDEPENDENT_AMBULATORY_CARE_PROVIDER_SITE_OTHER): Payer: Self-pay | Admitting: *Deleted

## 2007-08-18 ENCOUNTER — Encounter (INDEPENDENT_AMBULATORY_CARE_PROVIDER_SITE_OTHER): Payer: Self-pay | Admitting: *Deleted

## 2007-08-18 ENCOUNTER — Ambulatory Visit: Payer: Self-pay | Admitting: Internal Medicine

## 2007-08-18 LAB — CONVERTED CEMR LAB: Hgb A1c MFr Bld: 6.5 %

## 2007-08-25 ENCOUNTER — Ambulatory Visit: Payer: Self-pay | Admitting: Infectious Diseases

## 2007-08-25 ENCOUNTER — Encounter (INDEPENDENT_AMBULATORY_CARE_PROVIDER_SITE_OTHER): Payer: Self-pay | Admitting: *Deleted

## 2007-08-28 LAB — CONVERTED CEMR LAB
AST: 15 units/L (ref 0–37)
Alkaline Phosphatase: 73 units/L (ref 39–117)
BUN: 19 mg/dL (ref 6–23)
Creatinine, Ser: 1.25 mg/dL — ABNORMAL HIGH (ref 0.40–1.20)
HDL: 45 mg/dL (ref >39)
LDL Cholesterol: 129 mg/dL — ABNORMAL HIGH (ref 0–99)
Total CHOL/HDL Ratio: 4.6
Triglycerides: 155 mg/dL — ABNORMAL HIGH (ref <150)

## 2007-09-02 ENCOUNTER — Ambulatory Visit (HOSPITAL_COMMUNITY): Admission: RE | Admit: 2007-09-02 | Discharge: 2007-09-02 | Payer: Self-pay | Admitting: Internal Medicine

## 2007-09-08 ENCOUNTER — Encounter (INDEPENDENT_AMBULATORY_CARE_PROVIDER_SITE_OTHER): Payer: Self-pay | Admitting: *Deleted

## 2007-09-23 ENCOUNTER — Encounter (INDEPENDENT_AMBULATORY_CARE_PROVIDER_SITE_OTHER): Payer: Self-pay | Admitting: *Deleted

## 2007-10-18 ENCOUNTER — Telehealth (INDEPENDENT_AMBULATORY_CARE_PROVIDER_SITE_OTHER): Payer: Self-pay | Admitting: *Deleted

## 2007-11-30 ENCOUNTER — Ambulatory Visit: Payer: Self-pay | Admitting: Vascular Surgery

## 2007-12-02 ENCOUNTER — Telehealth (INDEPENDENT_AMBULATORY_CARE_PROVIDER_SITE_OTHER): Payer: Self-pay | Admitting: *Deleted

## 2007-12-23 ENCOUNTER — Encounter: Payer: Self-pay | Admitting: *Deleted

## 2007-12-23 ENCOUNTER — Ambulatory Visit: Payer: Self-pay | Admitting: Internal Medicine

## 2007-12-23 LAB — CONVERTED CEMR LAB: Hgb A1c MFr Bld: 6.4 %

## 2007-12-31 ENCOUNTER — Telehealth: Payer: Self-pay | Admitting: *Deleted

## 2008-01-14 DIAGNOSIS — I509 Heart failure, unspecified: Secondary | ICD-10-CM

## 2008-01-14 DIAGNOSIS — M8580 Other specified disorders of bone density and structure, unspecified site: Secondary | ICD-10-CM

## 2008-01-14 HISTORY — DX: Other specified disorders of bone density and structure, unspecified site: M85.80

## 2008-01-14 HISTORY — DX: Heart failure, unspecified: I50.9

## 2008-03-30 ENCOUNTER — Encounter (INDEPENDENT_AMBULATORY_CARE_PROVIDER_SITE_OTHER): Payer: Self-pay | Admitting: *Deleted

## 2008-03-31 ENCOUNTER — Ambulatory Visit: Payer: Self-pay | Admitting: Internal Medicine

## 2008-03-31 ENCOUNTER — Encounter (INDEPENDENT_AMBULATORY_CARE_PROVIDER_SITE_OTHER): Payer: Self-pay | Admitting: *Deleted

## 2008-03-31 LAB — CONVERTED CEMR LAB
Blood Glucose, AC Bkfst: 109 mg/dL
Hgb A1c MFr Bld: 8 %

## 2008-04-02 LAB — CONVERTED CEMR LAB
ALT: 14 units/L (ref 0–35)
Albumin: 4.4 g/dL (ref 3.5–5.2)
Alkaline Phosphatase: 75 units/L (ref 39–117)
CO2: 25 meq/L (ref 19–32)
Glucose, Bld: 108 mg/dL — ABNORMAL HIGH (ref 70–99)
LDL Cholesterol: 116 mg/dL — ABNORMAL HIGH (ref 0–99)
Microalb Creat Ratio: 50.6 mg/g — ABNORMAL HIGH (ref 0.0–30.0)
Microalb, Ur: 1.19 mg/dL (ref 0.00–1.89)
Potassium: 4.5 meq/L (ref 3.5–5.3)
Sodium: 142 meq/L (ref 135–145)
Total Protein: 6.9 g/dL (ref 6.0–8.3)
Triglycerides: 186 mg/dL — ABNORMAL HIGH (ref <150)
VLDL: 37 mg/dL (ref 0–40)

## 2008-04-18 ENCOUNTER — Ambulatory Visit: Payer: Self-pay | Admitting: Internal Medicine

## 2008-06-02 ENCOUNTER — Telehealth (INDEPENDENT_AMBULATORY_CARE_PROVIDER_SITE_OTHER): Payer: Self-pay | Admitting: *Deleted

## 2008-06-06 ENCOUNTER — Telehealth (INDEPENDENT_AMBULATORY_CARE_PROVIDER_SITE_OTHER): Payer: Self-pay | Admitting: *Deleted

## 2008-06-14 ENCOUNTER — Encounter (INDEPENDENT_AMBULATORY_CARE_PROVIDER_SITE_OTHER): Payer: Self-pay | Admitting: *Deleted

## 2008-06-14 ENCOUNTER — Ambulatory Visit: Payer: Self-pay | Admitting: Internal Medicine

## 2008-06-14 LAB — CONVERTED CEMR LAB: Blood Glucose, Fingerstick: 167

## 2008-06-15 LAB — CONVERTED CEMR LAB
ALT: 14 units/L (ref 0–35)
AST: 16 units/L (ref 0–37)
Albumin: 4.3 g/dL (ref 3.5–5.2)
Alkaline Phosphatase: 69 units/L (ref 39–117)
GFR calc non Af Amer: 32 mL/min — ABNORMAL LOW (ref >60)
Glucose, Bld: 171 mg/dL — ABNORMAL HIGH (ref 70–99)
HDL: 41 mg/dL (ref >39)
LDL Cholesterol: 70 mg/dL (ref 0–99)
Potassium: 4.3 meq/L (ref 3.5–5.3)
Sodium: 141 meq/L (ref 135–145)
Total Bilirubin: 0.4 mg/dL (ref 0.3–1.2)
Total CHOL/HDL Ratio: 3.7
Total Protein: 6.7 g/dL (ref 6.0–8.3)
VLDL: 41 mg/dL — ABNORMAL HIGH (ref 0–40)

## 2008-07-05 ENCOUNTER — Encounter (INDEPENDENT_AMBULATORY_CARE_PROVIDER_SITE_OTHER): Payer: Self-pay | Admitting: *Deleted

## 2008-07-05 ENCOUNTER — Ambulatory Visit: Payer: Self-pay | Admitting: Internal Medicine

## 2008-07-05 LAB — CONVERTED CEMR LAB: Blood Glucose, Fingerstick: 115

## 2008-07-06 ENCOUNTER — Ambulatory Visit (HOSPITAL_COMMUNITY): Admission: RE | Admit: 2008-07-06 | Discharge: 2008-07-06 | Payer: Self-pay | Admitting: Cardiovascular Disease

## 2008-07-06 LAB — CONVERTED CEMR LAB
CO2: 31 meq/L (ref 19–32)
Calcium: 9.9 mg/dL (ref 8.4–10.5)
Creatinine, Ser: 1.99 mg/dL — ABNORMAL HIGH (ref 0.40–1.20)
GFR calc Af Amer: 30 mL/min — ABNORMAL LOW (ref >60)
Sodium: 143 meq/L (ref 135–145)

## 2008-07-27 ENCOUNTER — Ambulatory Visit: Payer: Self-pay | Admitting: Internal Medicine

## 2008-07-27 ENCOUNTER — Encounter (INDEPENDENT_AMBULATORY_CARE_PROVIDER_SITE_OTHER): Payer: Self-pay | Admitting: Internal Medicine

## 2008-07-27 LAB — CONVERTED CEMR LAB
Blood Glucose, Fingerstick: 115
CO2: 30 meq/L (ref 19–32)
Calcium: 9.5 mg/dL (ref 8.4–10.5)
Creatinine, Ser: 1.35 mg/dL — ABNORMAL HIGH (ref 0.40–1.20)
Sodium: 143 meq/L (ref 135–145)

## 2008-09-14 ENCOUNTER — Ambulatory Visit (HOSPITAL_COMMUNITY): Admission: RE | Admit: 2008-09-14 | Discharge: 2008-09-14 | Payer: Self-pay | Admitting: Internal Medicine

## 2008-10-03 ENCOUNTER — Ambulatory Visit: Payer: Self-pay | Admitting: Internal Medicine

## 2008-10-03 LAB — CONVERTED CEMR LAB: Hgb A1c MFr Bld: 7.2 %

## 2008-10-04 ENCOUNTER — Encounter: Payer: Self-pay | Admitting: Internal Medicine

## 2008-11-24 ENCOUNTER — Ambulatory Visit (HOSPITAL_COMMUNITY): Admission: RE | Admit: 2008-11-24 | Discharge: 2008-11-24 | Payer: Self-pay | Admitting: Internal Medicine

## 2008-11-24 ENCOUNTER — Encounter: Payer: Self-pay | Admitting: Internal Medicine

## 2009-01-11 ENCOUNTER — Telehealth: Payer: Self-pay | Admitting: *Deleted

## 2009-01-16 ENCOUNTER — Telehealth: Payer: Self-pay | Admitting: Internal Medicine

## 2009-01-29 ENCOUNTER — Ambulatory Visit: Payer: Self-pay | Admitting: Internal Medicine

## 2009-01-29 LAB — CONVERTED CEMR LAB
BUN: 30 mg/dL — ABNORMAL HIGH (ref 6–23)
Blood Glucose, Fingerstick: 117
CO2: 32 meq/L (ref 19–32)
Calcium: 9.6 mg/dL (ref 8.4–10.5)
Chloride: 100 meq/L (ref 96–112)
Creatinine, Ser: 1.85 mg/dL — ABNORMAL HIGH (ref 0.40–1.20)
Hgb A1c MFr Bld: 7.9 %

## 2009-02-28 ENCOUNTER — Encounter: Payer: Self-pay | Admitting: Internal Medicine

## 2009-02-28 ENCOUNTER — Ambulatory Visit: Payer: Self-pay | Admitting: Internal Medicine

## 2009-02-28 LAB — CONVERTED CEMR LAB
ALT: 14 units/L (ref 0–35)
AST: 18 units/L (ref 0–37)
Alkaline Phosphatase: 71 units/L (ref 39–117)
BUN: 21 mg/dL (ref 6–23)
Creatinine, Ser: 1.6 mg/dL — ABNORMAL HIGH (ref 0.40–1.20)
HDL: 48 mg/dL (ref >39)
LDL Cholesterol: 68 mg/dL (ref 0–99)
Potassium: 4.4 meq/L (ref 3.5–5.3)
Total CHOL/HDL Ratio: 3.3

## 2009-03-21 ENCOUNTER — Encounter: Payer: Self-pay | Admitting: Internal Medicine

## 2009-03-21 ENCOUNTER — Ambulatory Visit: Payer: Self-pay | Admitting: Internal Medicine

## 2009-03-21 LAB — CONVERTED CEMR LAB: Blood Glucose, Fingerstick: 205

## 2009-03-22 LAB — CONVERTED CEMR LAB
Calcium: 9.6 mg/dL (ref 8.4–10.5)
Creatinine, Ser: 1.46 mg/dL — ABNORMAL HIGH (ref 0.40–1.20)
Sodium: 142 meq/L (ref 135–145)

## 2009-04-23 ENCOUNTER — Telehealth: Payer: Self-pay | Admitting: Internal Medicine

## 2009-05-14 ENCOUNTER — Encounter: Payer: Self-pay | Admitting: Internal Medicine

## 2009-06-13 ENCOUNTER — Ambulatory Visit: Payer: Self-pay | Admitting: Internal Medicine

## 2009-06-13 DIAGNOSIS — D239 Other benign neoplasm of skin, unspecified: Secondary | ICD-10-CM | POA: Insufficient documentation

## 2009-06-13 LAB — CONVERTED CEMR LAB
Blood Glucose, AC Bkfst: 216 mg/dL
Hgb A1c MFr Bld: 7.8 %
Microalb, Ur: 1.98 mg/dL — ABNORMAL HIGH (ref 0.00–1.89)

## 2009-06-21 ENCOUNTER — Ambulatory Visit: Payer: Self-pay | Admitting: Vascular Surgery

## 2009-08-23 ENCOUNTER — Ambulatory Visit: Payer: Self-pay | Admitting: Internal Medicine

## 2009-09-18 ENCOUNTER — Ambulatory Visit (HOSPITAL_COMMUNITY): Admission: RE | Admit: 2009-09-18 | Discharge: 2009-09-18 | Payer: Self-pay | Admitting: Internal Medicine

## 2009-09-27 ENCOUNTER — Encounter: Payer: Self-pay | Admitting: Internal Medicine

## 2009-09-27 ENCOUNTER — Ambulatory Visit: Payer: Self-pay | Admitting: Internal Medicine

## 2009-09-27 LAB — CONVERTED CEMR LAB
BUN: 24 mg/dL — ABNORMAL HIGH (ref 6–23)
CO2: 31 meq/L (ref 19–32)
Chloride: 101 meq/L (ref 96–112)
Glucose, Bld: 130 mg/dL — ABNORMAL HIGH (ref 70–99)
Potassium: 4.2 meq/L (ref 3.5–5.3)

## 2009-11-09 ENCOUNTER — Encounter (INDEPENDENT_AMBULATORY_CARE_PROVIDER_SITE_OTHER): Payer: Self-pay | Admitting: *Deleted

## 2009-11-16 ENCOUNTER — Telehealth: Payer: Self-pay | Admitting: Internal Medicine

## 2009-12-20 ENCOUNTER — Ambulatory Visit: Payer: Self-pay | Admitting: Internal Medicine

## 2009-12-20 LAB — CONVERTED CEMR LAB: Blood Glucose, Fingerstick: 285

## 2009-12-21 LAB — CONVERTED CEMR LAB
Chloride: 97 meq/L (ref 96–112)
Potassium: 4 meq/L (ref 3.5–5.3)
Sodium: 138 meq/L (ref 135–145)

## 2009-12-31 ENCOUNTER — Ambulatory Visit: Payer: Self-pay | Admitting: Internal Medicine

## 2009-12-31 ENCOUNTER — Encounter: Payer: Self-pay | Admitting: Internal Medicine

## 2009-12-31 LAB — CONVERTED CEMR LAB
BUN: 27 mg/dL — ABNORMAL HIGH (ref 6–23)
Chloride: 98 meq/L (ref 96–112)
OCCULT 1: NEGATIVE
OCCULT 2: NEGATIVE
OCCULT 3: NEGATIVE
Potassium: 4.2 meq/L (ref 3.5–5.3)

## 2010-01-24 ENCOUNTER — Telehealth (INDEPENDENT_AMBULATORY_CARE_PROVIDER_SITE_OTHER): Payer: Self-pay | Admitting: *Deleted

## 2010-01-24 ENCOUNTER — Ambulatory Visit (HOSPITAL_COMMUNITY)
Admission: RE | Admit: 2010-01-24 | Discharge: 2010-01-24 | Payer: Self-pay | Source: Home / Self Care | Attending: Internal Medicine | Admitting: Internal Medicine

## 2010-02-04 ENCOUNTER — Encounter: Payer: Self-pay | Admitting: Internal Medicine

## 2010-02-12 NOTE — Assessment & Plan Note (Signed)
Summary: CHECK UP [MKJ]   Vital Signs:  Patient profile:   70 year old female Height:      61 inches (154.94 cm) Weight:      140.8 pounds (64 kg) BMI:     26.70 Temp:     96.9 degrees F (36.06 degrees C) oral Pulse rate:   73 / minute BP sitting:   103 / 61  (right arm)  Vitals Entered By: Hilda Blades Ditzler RN (January 29, 2009 2:54 PM) Is Patient Diabetic? Yes Did you bring your meter with you today? Yes Pain Assessment Patient in pain? yes     Location: hips Intensity: 6 Type: aching Onset of pain  years with walking Nutritional Status BMI of 25 - 29 = overweight Nutritional Status Detail appetite good CBG Result 117  Have you ever been in a relationship where you felt threatened, hurt or afraid?denies   Does patient need assistance? Functional Status Self care Ambulation Normal Comments Ck-up and complete papers for [facility].   Primary Care Provider:  Katha Cabal MD   History of Present Illness: This is a  70 year old woman with past medical history of CAD s/p CABG in 2006 followed by Dr. Doylene Canard, DM, HLD, HTN, Psoriasis and OA who is here today for BP check up and to discuss:  1) DM - Follow up CBGs. Pt denies having any hypoglycemic events where she feels dizzy, or lightheaded. Denies increased thirst level, abdominal pain, N/V, or changes in bowel habits. Reports to check her feet daily for ulcers, and callouses.   No other complaints or concerns today.   Depression History:      The patient denies a depressed mood most of the day and a diminished interest in her usual daily activities.         Preventive Screening-Counseling & Management  Alcohol-Tobacco     Alcohol drinks/day: 0     Smoking Status: never  Caffeine-Diet-Exercise     Does Patient Exercise: yes     Type of exercise: walking     Times/week: 2  Current Medications (verified): 1)  Aspirin 325 Mg  Tabs (Aspirin) .... Take 1 Tablet By Mouth Once A Day 2)  Nexium 20 Mg Cpdr  (Esomeprazole Magnesium) .... Take 1 Tablet By Mouth Once A Day 3)  Calcium 600/vitamin D 600-200 Mg-Unit  Tabs (Calcium Carbonate-Vitamin D) .... Take 1 Tablet By Mouth Two Times A Day 4)  Furosemide 40 Mg  Tabs (Furosemide) .... Take 1 Tablet By Mouth Once A Day 5)  Klor-Con M10 10 Meq  Tbcr (Potassium Chloride Crys Cr) .... Take 1 Tablet By Mouth Once A Day 6)  Glipizide 10 Mg Tabs (Glipizide) .... Take 1 Tablet By Mouth Twice Daily Before Meals 7)  Crestor 40 Mg Tabs (Rosuvastatin Calcium) .... Take 1 Tablet By Mouth Once A Day 8)  Nitroquick 0.4 Mg Subl (Nitroglycerin) .... Take 1 Tablet Sublingually For Chest Pain 9)  Tylenol Arthritis Pain 650 Mg Tbcr (Acetaminophen) .... Take 1 Tablet By Mouth Every 4 Hours As Needed For Pain 10)  Ascensia Contour Test  Strp (Glucose Blood) .... Test Blood Sugar 2x/day-Before and 1-2 Hours After One Meal Daily 11)  Bd Ultra-Fine 33 Lancets  Misc (Lancets) .... Test Cbg Two Times A Day 12)  [name] 50 Mg Tabs (Sitagliptin Phosphate) .... Take 1 Tablet By Mouth Once A Day 13)  Diovan 320 Mg Tabs (Valsartan) .... Take 1 Tablet By Mouth Once A Day 14)  Lantus 100 Unit/ml  Soln (Insulin Glargine) .... Inject 40 Units Subcutaneously At Bedtime 15)  Dovonex 0.005 %  Soln (Calcipotriene) .... Apply Topically Two Times A Day To Affected Areas 16)  Bd Insulin Syringe Ultrafine 31g X 5/16" 0.5 Ml  Misc (Insulin Syringe-Needle U-100) .... To Inject Lantus Once Nightly 17)  Hydrocortisone 1 % Crea (Hydrocortisone) .... Apply To Affected Areas Two Times A Day.  Allergies (verified): No Known Drug Allergies  Past History:  Past Medical History: Last updated: 07/27/2008 Coronary artery disease, s/p 4 vessel CABG 12/13/04 [name] followed by Dr. Doylene Canard Pleural effusion post op s/p drainage 1.2 L Osteoarthritis Myocardial infarction, hx of Hypertension DM2 HLD CRI Constipation PAD MRI: lumbar spine 6/09:  Small right lateral recess disc protrusion at L1-2  contacts the descending right L2 root.  Mild to moderate central canal narrowing at L4-5 with narrowing of both lateral recesses left worse than right facet deg. disease could impact either L5 root. psoriasis  Past Surgical History: Last updated: 10/26/2006 Coronary artery bypass graft, 4 vessel 12/13/04 PTCA/stent 09/15/01 Hysterectomy Cataract extraction: left eye:  approx. 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  other kids are all in [place].  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  other kids are all in [place].  Review of Systems General:  Denies fatigue and weakness. CV:  Denies chest pain or discomfort, leg cramps with exertion, lightheadness, and swelling of feet. Resp:  Denies cough, sputum productive, and wheezing. GI:  Denies abdominal pain, change in bowel habits, constipation, nausea, and vomiting. GU:  Denies discharge, dysuria, urinary frequency, and urinary hesitancy. MS:  Complains of joint pain; bilateral hips.  Physical Exam  General:  alert and well-developed.   Head:  normocephalic and atraumatic.   Eyes:  vision grossly intact, pupils equal, pupils round, and pupils reactive to light.   Mouth:  good dentition, no gingival abnormalities, no dental plaque, and pharynx pink and moist.   Neck:  supple, full ROM, and no masses.   Lungs:  normal respiratory effort, no intercostal retractions, no accessory muscle use, normal breath sounds, no dullness, no fremitus, no crackles, and no wheezes.   Heart:  normal rate, regular rhythm, no murmur, no gallop, and no rub.     Abdomen:  soft, non-tender, and normal bowel sounds.   Msk:  normal ROM, no joint tenderness, and no joint swelling.   Pulses:  R radial normal and L radial normal.   Extremities:  no edema noted  Neurologic:  alert & oriented X3 and cranial nerves II-XII intact.    Diabetes Management Exam:    Foot Exam (with socks and/or shoes not present):       Sensory-Pinprick/Light touch:          Left medial foot (L-4): normal          Left dorsal foot (L-5): normal          Left lateral foot (S-1): normal          Right medial foot (L-4): normal          Right dorsal foot (L-5): normal          Right  lateral foot (S-1): normal       Sensory-Monofilament:          Left foot: normal          Right foot: normal       Inspection:          Left foot: normal          Right foot: normal       Nails:          Left foot: normal          Right foot: normal   Impression & Recommendations:  Problem # 1:  DIABETES MELLITUS, TYPE II (ICD-250.00) Assessment Deteriorated A1C is elevated at 7.9. Pt reports taking insulin as directed. Glucose meter log indicates blood sugars consistently below 150 taken all in the morning. AN A1C of almost 8 corresponds with average blood sugars of approx. 200. I have advised the patient to check her blood sugars throughout the day as I suspect her sugars post-prandiol and pre-prandiol are elevated. I will have her follow up in 1 month to reassess CBGs and will adjust Lantus accordingly. Will also check BMet to evaluate renal function.  Her updated medication list for this problem includes:    Aspirin 325 Mg Tabs (Aspirin) .Marland Kitchen... Take 1 tablet by mouth once a day    Glipizide 10 Mg Tabs (Glipizide) .Marland Kitchen... Take 1 tablet by mouth twice daily before meals    [name] 50 Mg Tabs (Sitagliptin phosphate) .Marland Kitchen... Take 1 tablet by mouth once a day    Diovan 320 Mg Tabs (Valsartan) .Marland Kitchen... Take 1 tablet by mouth once a day    Lantus 100 Unit/ml Soln (Insulin glargine) ..... Inject 40  units subcutaneously at bedtime  Labs Reviewed: Creat: 1.35 (07/27/2008)    Reviewed HgBA1c results: 7.9 (01/29/2009)  7.2 (10/03/2008)  Orders: T-Basic Metabolic Panel (99991111)  Problem # 2:  HYPERTENSION (ICD-401.9) Assessment: Improved Blood pressure well controlled. Will continue current regimen.   Her updated medication list for this problem includes:    Furosemide 40 Mg Tabs (Furosemide) .Marland Kitchen... Take 1 tablet by mouth once a day    Diovan 320 Mg Tabs (Valsartan) .Marland Kitchen... Take 1 tablet by mouth once a day  Orders: T-Basic Metabolic Panel (99991111)  BP today: 103/61 Prior BP: 102/57 (10/03/2008)  Labs Reviewed: K+: 4.2 (07/27/2008) Creat: : 1.35 (07/27/2008)   Chol: 152 (06/14/2008)   HDL: 41 (06/14/2008)   LDL: 70 (06/14/2008)   TG: 205 (06/14/2008)  Problem # 3:  OSTEOARTHRITIS (ICD-715.90) Assessment: Unchanged No acute events of increased pain or debilitation. Will continue current treatment for OA.  Her updated medication list for this problem includes:    Aspirin 325 Mg Tabs (Aspirin) .Marland Kitchen... Take 1 tablet by mouth once a day    Tylenol Arthritis Pain 650 Mg Tbcr (Acetaminophen) .Marland Kitchen... Take 1 tablet by mouth every 4 hours as needed for pain  Problem # 4:  RENAL INSUFFICIENCY (ICD-588.9) Assessment: Comment Only Last Cr 1.35 in 07/2008. Given recent A1C of 7.9, will check BMet to re-evaluate renal function.   Complete Medication List: 1)  Aspirin 325 Mg Tabs (Aspirin) .... Take 1 tablet by mouth once a day 2)  Nexium 20 Mg Cpdr (Esomeprazole magnesium) .... Take 1 tablet by mouth once a day 3)  Calcium 600/vitamin D 600-200 Mg-unit Tabs (Calcium carbonate-vitamin d) .... Take 1 tablet by mouth two times a day 4)  Furosemide 40 Mg Tabs (Furosemide) .... Take 1 tablet by mouth once a day 5)  Klor-con M10 10 Meq Tbcr (Potassium chloride crys cr) .... Take 1 tablet by mouth once a day 6)  Glipizide 10 Mg Tabs (Glipizide) .... Take 1 tablet by mouth twice daily  before meals 7)  Crestor 40 Mg Tabs (Rosuvastatin calcium) .... Take 1 tablet by mouth once a day 8)  Nitroquick 0.4 Mg Subl (Nitroglycerin) .... Take 1 tablet sublingually for chest pain 9)  Tylenol Arthritis Pain 650 Mg Tbcr (Acetaminophen) .... Take 1 tablet by mouth every 4 hours as needed for pain 10)  Ascensia Contour Test Strp (Glucose blood) .... Test blood sugar 2x/day-before and 1-2 hours after one meal daily 11)  Bd Ultra-fine 33 Lancets Misc (Lancets) .... Test cbg two times a day 12)  [name] 50 Mg Tabs (Sitagliptin phosphate) .... Take 1 tablet by mouth once a day 13)  Diovan 320 Mg Tabs (Valsartan) .... Take 1 tablet by mouth once a day 14)  Lantus 100 Unit/ml Soln (Insulin glargine) .... Inject 40 units subcutaneously at bedtime 15)  Dovonex 0.005 % Soln (Calcipotriene) .... Apply topically two times a day to affected areas 16)  Bd Insulin Syringe Ultrafine 31g X 5/16" 0.5 Ml Misc (Insulin syringe-needle u-100) .... To inject lantus once nightly 17)  Hydrocortisone 1 % Crea (Hydrocortisone) .... Apply to affected areas two times a day.  Other Orders: T- Capillary Blood Glucose ([id number]) T-Hgb A1C (in-house) [id number])  Patient Instructions: 1)  Please schedule a follow-up appointment in 1 month. 2)  Please check your blood sugar at different times of the day, that is once before bed, or after dinner, or in the afternoon. Please document these sugars and bring your glucose meter log at your next appointment. 3)  Check your feet each night for sore areas, calluses or signs of infection. Prescriptions: HYDROCORTISONE 1 % CREA (HYDROCORTISONE) Apply to affected areas two times a day.  #1 tube x 6   Entered and Authorized by:   Jolene Provost MD   Signed by:   Jolene Provost MD on 01/29/2009   Method used:   Electronically to        [facility]* (mail-order)       [place]  [address]       Ph: [phone]       Fax: [phone]    RxID:   [id number]  Process Orders Check Orders Results:     [facility]: Check successful Tests Sent for requisitioning (January 29, 2009 4:03 PM):     01/29/2009: [facility] -- T-Basic Metabolic Panel 0000000 (signed)    Prevention & Chronic Care Immunizations   Influenza vaccine: Fluvax MCR  (10/03/2008)    Tetanus booster: Not documented    Pneumococcal vaccine: Pneumovax  (10/26/2006)    H. zoster vaccine: Not documented  Colorectal Screening   Hemoccult: Negative  (09/07/2003)    Colonoscopy: 6 polyps removed. 1 polyp in descending colon:  6 mm, sessile several polyps cecum to ascending colon:  2-41mm ?polyp in appendix:  biopsy pending If it is an appendiceal polyp, may need appendectomy, surgical removal Results:  Diverticulosis.         (11/06/2006)   Colonoscopy action/deferral: Repeat colonoscopy in 3 years.     (11/06/2006)   Colonoscopy due: 11/05/2009  Other Screening   Pap smear: Not documented   Pap smear action/deferral: Deferred  (10/03/2008)    Mammogram: ASSESSMENT: Negative - BI-RADS 1^MM DIGITAL SCREENING  (09/14/2008)  Mammogram action/deferral: Screening mammogram in 1 year.     (09/02/2007)   Mammogram due: 09/01/2009    DXA bone density scan: Not documented   DXA bone density action/deferral: Ordered  (10/03/2008)   Smoking status: never  (01/29/2009)  Diabetes Mellitus   HgbA1C: 7.9  (01/29/2009)    Eye exam: Not documented    Foot exam: yes  (01/29/2009)   High risk foot: No  (08/18/2007)   Foot care education: Done  (08/18/2007)   Foot exam due: 06/14/2009    Urine microalbumin/creatinine ratio: 50.6  (03/31/2008)   Urine microalbumin/cr due: 03/31/2009    Diabetes flowsheet reviewed?: Yes   Progress toward A1C goal: Unchanged  Lipids   Total Cholesterol: 152  (06/14/2008)   LDL: 70  (06/14/2008)   LDL Direct: Not documented   HDL: 41  (06/14/2008)   Triglycerides: 205   (06/14/2008)    SGOT (AST): 16  (06/14/2008)   SGPT (ALT): 14  (06/14/2008)   Alkaline phosphatase: 69  (06/14/2008)   Total bilirubin: 0.4  (06/14/2008)    Lipid flowsheet reviewed?: Yes   Progress toward LDL goal: At goal  Hypertension   Last Blood Pressure: 103 / 61  (01/29/2009)   Serum creatinine: 1.35  (07/27/2008)   Serum potassium 4.2  (07/27/2008)    Hypertension flowsheet reviewed?: Yes   Progress toward BP goal: At goal  Self-Management Support :   Personal Goals (by the next clinic visit) :     Personal A1C goal: 6  (01/29/2009)     Personal blood pressure goal: 130/80  (01/29/2009)     Personal LDL goal: 70  (01/29/2009)    Patient will work on the following items until the next clinic visit to reach self-care goals:     Medications and monitoring: check my blood sugar, check my blood pressure, bring all of my medications to every visit, examine my feet every day  (01/29/2009)     Eating: drink diet soda or water instead of juice or soda, eat more vegetables, use fresh or frozen vegetables, eat foods that are low in salt, eat fruit for snacks and desserts, limit or avoid alcohol  (01/29/2009)     Activity: take a 30 minute walk every day, take the stairs instead of the elevator, park at the far end of the parking lot  (01/29/2009)    Diabetes self-management support: Copy of home glucose meter record, CBG self-monitoring log, Written self-care plan  (01/29/2009)   Diabetes care plan printed    Hypertension self-management support: Copy of home glucose meter record, CBG self-monitoring log, Written self-care plan  (01/29/2009)   Hypertension self-care plan printed.    Lipid self-management support: Written self-care plan  (01/29/2009)   Lipid self-care plan printed.  Laboratory Results   Blood Tests   Date/Time Received: January 29, 2009 3:09 PM  Date/Time Reported: Lenoria Farrier  January 29, 2009 3:09 PM   HGBA1C: 7.9%   (Normal Range: Non-Diabetic -  3-6%   Control Diabetic - 6-8%) CBG Random:: 117mg /dL      Process Orders Check Orders Results:     [facility]: Check successful Tests Sent for requisitioning (January 29, 2009 4:03 PM):     01/29/2009: [facility] -- T-Basic Metabolic Panel 0000000 (signed)

## 2010-02-12 NOTE — Progress Notes (Signed)
Summary: Refill medication change  Phone Note Refill Request Message from:  Patient on January 16, 2009 9:17 AM  Refills Requested: Medication #1:  PROTONIX 40 MG  TBEC Take 1 tablet by mouth once a day Medication no longer covered needs another medication sent in.   Method Requested: Electronic Initial call taken by: Sander Nephew RN,  January 16, 2009 9:18 AM  Follow-up for Phone Call        Will use Nexium- i think this is covered- if not she can use omeprazole OTC. Follow-up by: Rhea Pink  DO,  January 16, 2009 10:41 AM  Additional Follow-up for Phone Call Additional follow up Details #1::        Rx faxed to pharmacy Additional Follow-up by: Sander Nephew RN,  January 23, 2009 2:00 PM    New/Updated Medications: NEXIUM 20 MG CPDR (ESOMEPRAZOLE MAGNESIUM) Take 1 tablet by mouth once a day Prescriptions: NEXIUM 20 MG CPDR (ESOMEPRAZOLE MAGNESIUM) Take 1 tablet by mouth once a day  #30 x 3   Entered and Authorized by:   Rhea Pink  DO   Signed by:   Rhea Pink  DO on 01/16/2009   Method used:   Electronically to        [facility]* (mail-order)       [address] [place]  [address]       Ph: [phone]       Fax: [phone]   [id number]

## 2010-02-12 NOTE — Letter (Signed)
Summary: Colonoscopy Letter  [facility]  [place], [place] [address]   Phone: [phone]  Fax: [phone]      November 09, 2009 MRN: [id number]   [name] [address], [place]  [address]   Dear [name],   According to your medical record, it is time for you to schedule a Colonoscopy. The American Cancer Society recommends this procedure as a method to detect early colon cancer. Patients with a family history of colon cancer, or a personal history of colon polyps or inflammatory bowel disease are at increased risk.  This letter has been generated based on the recommendations made at the time of your procedure. If you feel that in your particular situation this may no longer apply, please contact our office.  Please call our office at [phone] to schedule this appointment or to update your records at your earliest convenience.  Thank you for cooperating with [place] to provide you with the very best care possible.   Sincerely,   Gatha Mayer, M.D.  [facility] [phone]

## 2010-02-12 NOTE — Assessment & Plan Note (Signed)
Summary: EST-1 MONTH RECHECK/CH   Vital Signs:  Patient profile:   70 year old female Height:      61 inches Weight:      143.8 pounds BMI:     27.27 Temp:     98.0 degrees F oral Pulse rate:   74 / minute BP sitting:   140 / 78  (right arm)  Vitals Entered By: [name] NT II (September 27, 2009 1:37 PM) CC: NEED REFILLS Is Patient Diabetic? Yes Did you bring your meter with you today? Yes Nutritional Status BMI of 25 - 29 = overweight CBG Result 137  Have you ever been in a relationship where you felt threatened, hurt or afraid?No   Does patient need assistance? Functional Status Self care Ambulation Normal   Diabetic Foot Exam Last Podiatry Exam Date: 07/23/2007 Foot Inspection Is there a history of a foot ulcer?              No Is there a foot ulcer now?              No Can the patient see the bottom of their feet?          Yes Are the shoes appropriate in style and fit?          No Is there swelling or an abnormal foot shape?          No Are the toenails long?                No Are the toenails thick?                No Are the toenails ingrown?              No Is there heavy callous build-up?              No Is there pain in the calf muscle (Intermittent claudication) when walking?    NoIs there a claw toe deformity?              No Is there elevated skin temperature?            No Is there limited ankle dorsiflexion?            No Is there foot or ankle muscle weakness?            No  Diabetic Foot Care Education Patient educated on appropriate care of diabetic feet.  Pulse Check          Right Foot          Left Foot Posterior Tibial:        normal            normal Dorsalis Pedis:        normal            normal  High Risk Feet? No   10-g (5.07) Semmes-Weinstein Monofilament Test Performed by: [name] NT II          Right Foot          Left Foot Site 1         normal         normal Site 2         normal         normal Site 3         normal          normal Site 4         normal  normal Site 5         normal         normal Site 6         normal         normal Site 7         normal         normal Site 8         normal         normal Site 9         normal         normal  Impression      normal         normal   Primary Care Provider:  Katha Cabal MD  CC:  NEED REFILLS.  History of Present Illness: [name] is a 70 yo F with PMH of CAD s/p CABG 2006, with EF 30-35% followed by Dr. Doylene Canard, DM and HTN who presents for follow-up. She was last seen in clinic June 2011 for DM follow up.  1) Right leg pain - for the past month. Pain is located over right shin and calf area. Pain is described as a dull ache. No numbness or tingling. Does not radiate. Pain is constant, and pt takes Aspirin advance for pain relief but states it does not help.   2) DM - Patient has brought her blood glucose meter log and her am sugars have been running from 80-150 range. Pt denies any episodes of hypoglycemia.  3) She states she has been exercising at the [facility] but amount of exercise decreased since she has developed new leg pain. No chest pain, or shortness of breath with exertion.   She denies CP, cough, SOB, orthopnea, PND, abdominal pain, N/V, changes in bowel habits, changes in urinary habits, and no recent hypoglycemic episodes.    Preventive Screening-Counseling & Management  Alcohol-Tobacco     Alcohol drinks/day: 0     Smoking Status: never  Caffeine-Diet-Exercise     Does Patient Exercise: yes     Type of exercise: walking     Times/week: 2  Current Medications (verified): 1)  Aspirin 325 Mg  Tabs (Aspirin) .... Take 1 Tablet By Mouth Once A Day 2)  Nexium 20 Mg Cpdr (Esomeprazole Magnesium) .... Take 1 Tablet By Mouth Once A Day 3)  Calcium 600/vitamin D 600-200 Mg-Unit  Tabs (Calcium Carbonate-Vitamin D) .... Take 1 Tablet By Mouth Two Times A Day 4)  Furosemide 40 Mg  Tabs (Furosemide) .... Take 1 Tablet By Mouth Once A Day 5)   Klor-Con M10 10 Meq  Tbcr (Potassium Chloride Crys Cr) .... Take 1 Tablet By Mouth Once A Day 6)  Glipizide 10 Mg Tabs (Glipizide) .... Take 1 Tablet By Mouth Twice Daily Before Meals 7)  Crestor 40 Mg Tabs (Rosuvastatin Calcium) .... Take 1 Tablet By Mouth Once A Day 8)  Nitroquick 0.4 Mg Subl (Nitroglycerin) .... Take 1 Tablet Sublingually For Chest Pain 9)  Tylenol Arthritis Pain 650 Mg Tbcr (Acetaminophen) .... Take 1 Tablet By Mouth Every 4 Hours As Needed For Pain 10)  Ascensia Contour Test  Strp (Glucose Blood) .... Test Blood Sugar 2x/day-Before and 1-2 Hours After One Meal Daily 11)  Bd Ultra-Fine 33 Lancets  Misc (Lancets) .... Test Cbg Two Times A Day 12)  [name] 50 Mg Tabs (Sitagliptin Phosphate) .... Take 1 Tablet By Mouth Once A Day 13)  Diovan 320 Mg Tabs (Valsartan) .... Take 1 Tablet By Mouth Once A Day  14)  Lantus 100 Unit/ml Soln (Insulin Glargine) .... Inject 43 Units Subcutaneously At Bedtime 15)  Dovonex 0.005 %  Soln (Calcipotriene) .... Apply Topically Two Times A Day To Affected Areas 16)  Lantus 100 Unit/ml Soln (Insulin Glargine) .... 43 Units in Evening 17)  Hydrocortisone 1 % Crea (Hydrocortisone) .... Apply To Affected Areas Two Times A Day. 18)  Lantus Solostar 100 Unit/ml Soln (Insulin Glargine) .... Inject 43 Units in The Evening 19)  Pen Needles 31g X 6 Mm Misc (Insulin Pen Needle) .... Use Once Daily With The Lantus Solostar To Inject Insulin 20)  Voltaren 1 % Gel (Diclofenac Sodium) .... Please Apply To Area, 1-4 Times Daily As Needed For Pain.  Allergies (verified): No Known Drug Allergies  Past History:  Past Medical History: Last updated: 07/27/2008 Coronary artery disease, s/p 4 vessel CABG 12/13/04 [name] followed by Dr. Doylene Canard Pleural effusion post op s/p drainage 1.2 L Osteoarthritis Myocardial infarction, hx of Hypertension DM2 HLD CRI Constipation PAD MRI: lumbar spine 6/09:  Small right lateral recess disc protrusion at L1-2 contacts  the descending right L2 root.  Mild to moderate central canal narrowing at L4-5 with narrowing of both lateral recesses left worse than right facet deg. disease could impact either L5 root. psoriasis  Past Surgical History: Last updated: 10/26/2006 Coronary artery bypass graft, 4 vessel 12/13/04 PTCA/stent 09/15/01 Hysterectomy Cataract extraction: left eye:  approx. 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  other kids are all in [place].  Risk Factors: Alcohol Use: 0 (09/27/2009) Exercise: yes (09/27/2009)  Risk Factors: Smoking Status: never (09/27/2009)  Review of Systems      See HPI  Physical Exam  General:  alert and well-developed.   Head:  normocephalic and atraumatic.   Neck:  supple.   Lungs:  normal respiratory effort and normal breath sounds.   Heart:  normal rate and regular rhythm.   Abdomen:  soft, non-tender, normal bowel sounds, and no distention.   Msk:  normal ROM, no joint tenderness, no joint swelling, no joint warmth, no redness over joints, and no joint deformities.   Pulses:  R radial normal, R posterior tibial normal, R dorsalis pedis normal, L radial normal, L posterior tibial normal, and L dorsalis pedis normal.   Extremities:  no lower extremity edema present  Neurologic:  alert & oriented X3.   Skin:  dry scaly skin located over both forearms consistent with psoriasis  Psych:  normally interactive and good eye contact.    Diabetes Management Exam:    Foot Exam (with socks and/or shoes not present):       Sensory-Monofilament:          Left foot: normal          Right foot: normal   Impression & Recommendations:  Problem # 1:  DIABETES MELLITUS, TYPE II (ICD-250.00) Assessment Unchanged Will continue current regimen. Pt does not check her sugars pre or post prandiol. Pt still refusing to  do sliding scale coverage. Will continue current regimen.   Her updated medication list for this problem includes:    Aspirin 325 Mg Tabs (Aspirin) .Marland Kitchen... Take 1 tablet by mouth once a day    Glipizide 10 Mg Tabs (Glipizide) .Marland Kitchen... Take 1 tablet by mouth twice daily before meals    [name] 50 Mg Tabs (Sitagliptin phosphate) .Marland Kitchen... Take 1 tablet by  mouth once a day    Diovan 320 Mg Tabs (Valsartan) .Marland Kitchen... Take 1 tablet by mouth once a day    Lantus 100 Unit/ml Soln (Insulin glargine) ..... Inject 43 units subcutaneously at bedtime    Lantus 100 Unit/ml Soln (Insulin glargine) .Marland KitchenMarland KitchenMarland KitchenMarland Kitchen 43 units in evening    Lantus Solostar 100 Unit/ml Soln (Insulin glargine) ..... Inject 43 units in the evening  Orders: T- Capillary Blood Glucose [id number]) T-Hgb A1C (in-house) [id number])  Labs Reviewed: Creat: 1.46 (03/21/2009)    Reviewed HgBA1c results: 7.8 (09/27/2009)  7.8 (06/13/2009)  Problem # 2:  HYPERTENSION (ICD-401.9) Slightly up. Will continue to follow but continue current regimen for now. Check BMet today.   Her updated medication list for this problem includes:    Furosemide 40 Mg Tabs (Furosemide) .Marland Kitchen... Take 1 tablet by mouth once a day    Diovan 320 Mg Tabs (Valsartan) .Marland Kitchen... Take 1 tablet by mouth once a day  Orders: T-Basic Metabolic Panel ([id number])  BP today: 140/78 Prior BP: 125/62 (08/23/2009)  Labs Reviewed: K+: 3.9 (03/21/2009) Creat: : 1.46 (03/21/2009)   Chol: 158 (02/28/2009)   HDL: 48 (02/28/2009)   LDL: 68 (02/28/2009)   TG: 210 (02/28/2009)  Problem # 3:  OSTEOARTHRITIS (ICD-715.90) Flector patch and ultram for pain relief. Advised patient to avoid high doses of aspirin as she has been taking it for pain relief.   Her updated medication list for this problem includes:    Aspirin 325 Mg Tabs (Aspirin) .Marland Kitchen... Take 1 tablet by mouth once a day    Tylenol Arthritis Pain 650 Mg Tbcr (Acetaminophen) .Marland Kitchen... Take 1 tablet by mouth every 4 hours as needed for pain    Ultram 50 Mg  Tabs (Tramadol hcl) .Marland Kitchen... Take 1 tab every 4-6 hours as needed for pain  Problem # 4:  Preventive Health Care (ICD-V70.0) Flu shot today.   Complete Medication List: 1)  Aspirin 325 Mg Tabs (Aspirin) .... Take 1 tablet by mouth once a day 2)  Nexium 20 Mg Cpdr (Esomeprazole magnesium) .... Take 1 tablet by mouth once a day 3)  Calcium 600/vitamin D 600-200 Mg-unit Tabs (Calcium carbonate-vitamin d) .... Take 1 tablet by mouth two times a day 4)  Furosemide 40 Mg Tabs (Furosemide) .... Take 1 tablet by mouth once a day 5)  Klor-con M10 10 Meq Tbcr (Potassium chloride crys cr) .... Take 1 tablet by mouth once a day 6)  Glipizide 10 Mg Tabs (Glipizide) .... Take 1 tablet by mouth twice daily before meals 7)  Crestor 40 Mg Tabs (Rosuvastatin calcium) .... Take 1 tablet by mouth once a day 8)  Nitroquick 0.4 Mg Subl (Nitroglycerin) .... Take 1 tablet sublingually for chest pain 9)  Tylenol Arthritis Pain 650 Mg Tbcr (Acetaminophen) .... Take 1 tablet by mouth every 4 hours as needed for pain 10)  Ascensia Contour Test Strp (Glucose blood) .... Test blood sugar 2x/day-before and 1-2 hours after one meal daily 11)  Bd Ultra-fine 33 Lancets Misc (Lancets) .... Test cbg two times a day 12)  [name] 50 Mg Tabs (Sitagliptin phosphate) .... Take 1 tablet by mouth once a day 13)  Diovan 320 Mg Tabs (Valsartan) .... Take 1 tablet by mouth once a day 14)  Lantus 100 Unit/ml Soln (Insulin glargine) .... Inject 43 units subcutaneously at bedtime 15)  Dovonex 0.005 % Soln (Calcipotriene) .... Apply topically two times a day to affected areas 16)  Lantus 100 Unit/ml Soln (Insulin glargine) .... 43 units in evening 17)  Hydrocortisone 1 % Crea (Hydrocortisone) .... Apply to affected areas two times a day. 18)  Lantus Solostar 100 Unit/ml Soln (Insulin glargine) .... Inject 43 units in the evening 19)  Pen Needles 31g X 6 Mm Misc (Insulin pen needle) .... Use once daily with the lantus solostar to inject  insulin 20)  Ultram 50 Mg Tabs (Tramadol hcl) .... Take 1 tab every 4-6 hours as needed for pain 21)  Flector 1.3 % Ptch (Diclofenac epolamine) .... Apply patch once daily  Other Orders: Flu Vaccine 7yrs + MEDICARE PATIENTS [id number]) Administration Flu vaccine - MCR [id number])  Patient Instructions: 1)  Please schedule a follow-up appointment in 3 months. Prescriptions: FLECTOR 1.3 % PTCH (DICLOFENAC EPOLAMINE) apply patch once daily  #1 box x 0   Entered and Authorized by:   Jolene Provost MD   Signed by:   Jolene Provost MD on 09/27/2009   Method used:   Printed then faxed to ...       [facility]* (mail-order)       [place]  [address]       Ph: [phone]       Fax: [phone]   [id number] ULTRAM 50 MG TABS (TRAMADOL HCL) take 1 tab every 4-6 hours as needed for pain  #30 x 0   Entered and Authorized by:   Jolene Provost MD   Signed by:   Jolene Provost MD on 09/27/2009   Method used:   Printed then faxed to ...       [facility]* (mail-order)       [place]  [address]       Ph: [phone]       Fax: [phone]   [id number] [name] 50 MG TABS (SITAGLIPTIN PHOSPHATE) Take 1 tablet by mouth once a day  #31 x 6   Entered and Authorized by:   Jolene Provost MD   Signed by:   Jolene Provost MD on 09/27/2009   Method used:   Printed then faxed to ...       [facility]* (mail-order)       [place]  [address]       Ph: [phone]       Fax: [phone]   RxID:   [id number] NEXIUM 20 MG CPDR (ESOMEPRAZOLE MAGNESIUM) Take 1 tablet by mouth once a day  #30 x 3   Entered and Authorized by:   Jolene Provost MD   Signed by:   Jolene Provost MD on 09/27/2009   Method used:   Printed then faxed to ...       [facility]* (mail-order)       [place]  [address]       Ph:  [phone]       Fax: [phone]   [id number] CRESTOR 40 MG TABS (ROSUVASTATIN CALCIUM) Take 1 tablet by mouth once a day  #30 x 3   Entered and Authorized by:   Jolene Provost MD   Signed by:   Jolene Provost MD on 09/27/2009   Method used:   Printed then faxed to ...       [facility]* (mail-order)  [address] [place]  [address]       Ph: [phone]       Fax: [phone]   [id number]    Process Orders Check Orders Results:     [facility]: Check successful Tests Sent for requisitioning (September 27, 2009 4:44 PM):     09/27/2009: [facility] -- T-Basic Metabolic Panel 0000000 (signed)     Prevention & Chronic Care Immunizations   Influenza vaccine: Fluvax 3+  (09/27/2009)   Influenza vaccine due: 09/13/2009    Tetanus booster: Not documented    Pneumococcal vaccine: Pneumovax  (10/26/2006)    H. zoster vaccine: Not documented  Colorectal Screening   Hemoccult: Negative  (09/07/2003)    Colonoscopy: 6 polyps removed. 1 polyp in descending colon:  6 mm, sessile several polyps cecum to ascending colon:  2-61mm ?polyp in appendix:  biopsy pending If it is an appendiceal polyp, may need appendectomy, surgical removal Results:  Diverticulosis.         (11/06/2006)   Colonoscopy action/deferral: Repeat colonoscopy in 3 years.     (11/06/2006)   Colonoscopy due: 11/05/2009  Other Screening   Pap smear: Not documented   Pap smear action/deferral: Not indicated S/P hysterectomy  (08/23/2009)    Mammogram: ASSESSMENT: Negative - BI-RADS 1^MM DIGITAL SCREENING  (09/18/2009)   Mammogram action/deferral: Screening mammogram in 1 year.     (09/02/2007)   Mammogram due: 09/01/2009    DXA bone density scan: Not documented   DXA bone density action/deferral: Ordered  (10/03/2008)   Smoking status: never  (09/27/2009)  Diabetes Mellitus   HgbA1C: 7.8   (09/27/2009)    Eye exam: Not documented    Foot exam: yes  (09/27/2009)   Foot exam action/deferral: Do today   High risk foot: No  (09/27/2009)   Foot care education: Done  (09/27/2009)   Foot exam due: 06/14/2009    Urine microalbumin/creatinine ratio: 81.1  (06/13/2009)   Urine microalbumin/cr due: 03/31/2009    Diabetes flowsheet reviewed?: Yes   Progress toward A1C goal: Unchanged  Lipids   Total Cholesterol: 158  (02/28/2009)   Lipid panel action/deferral: Lipid Panel ordered   LDL: 68  (02/28/2009)   LDL Direct: Not documented   HDL: 48  (02/28/2009)   Triglycerides: 210  (02/28/2009)    SGOT (AST): 18  (02/28/2009)   BMP action: Ordered   SGPT (ALT): 14  (02/28/2009)   Alkaline phosphatase: 71  (02/28/2009)   Total bilirubin: 0.4  (02/28/2009)    Lipid flowsheet reviewed?: Yes   Progress toward LDL goal: At goal  Hypertension   Last Blood Pressure: 140 / 78  (09/27/2009)   Serum creatinine: 1.46  (03/21/2009)   BMP action: Ordered   Serum potassium 3.9  (03/21/2009)    Hypertension flowsheet reviewed?: Yes   Progress toward BP goal: At goal  Self-Management Support :   Personal Goals (by the next clinic visit) :     Personal A1C goal: 7  (09/27/2009)     Personal blood pressure goal: 130/80  (09/27/2009)     Personal LDL goal: 70  (08/23/2009)    Patient will work on the following items until the next clinic visit to reach self-care goals:     Medications and monitoring: take my medicines every day, check my blood sugar, bring all of my medications to every visit  (09/27/2009)     Eating: eat more vegetables, use fresh or  frozen vegetables, eat foods that are low in salt, eat baked foods instead of fried foods, eat fruit for snacks and desserts  (08/23/2009)     Activity: take a 30 minute walk every day  (08/23/2009)    Diabetes self-management support: Copy of home glucose meter record, Written self-care plan  (09/27/2009)   Diabetes care plan  printed    Hypertension self-management support: Written self-care plan  (09/27/2009)   Hypertension self-care plan printed.    Lipid self-management support: Written self-care plan  (09/27/2009)   Lipid self-care plan printed.   Nursing Instructions: Diabetic foot exam today Give Flu vaccine today   Laboratory Results   Blood Tests   Date/Time Received: September 27, 2009 2:21 PM Date/Time Reported: Maryan Rued  September 27, 2009 2:21 PM   HGBA1C: 7.8%   (Normal Range: Non-Diabetic - 3-6%   Control Diabetic - 6-8%) CBG Random:: 137mg /dL    Flu Vaccine Consent Questions     Do you have a history of severe allergic reactions to this vaccine? no    Any prior history of allergic reactions to egg and/or gelatin? no    Do you have a sensitivity to the preservative Thimersol? no    Do you have a past history of Guillan-Barre Syndrome? no    Do you currently have an acute febrile illness? no    Have you ever had a severe reaction to latex? no    Vaccine information given and explained to patient? yes    Are you currently pregnant? no    Lot Number:[id number]   Exp Date:07/13/2010   Manufacturer: [employer]    Site Given Right Deltoid IM.Mateo Flow [facility])  September 27, 2009 2:51 PM  Date/Time Reported: Maryan Rued  September 27, 2009 2:21 PM   HGBA1C: 7.8%   (Normal Range: Non-Diabetic - 3-6%   Control Diabetic - 6-8%) CBG Random:: 137mg /dL     .medflu

## 2010-02-12 NOTE — Progress Notes (Signed)
Summary: Refill/gh  Phone Note Refill Request Message from:  Fax from Pharmacy on November 16, 2009 2:30 PM  Refills Requested: Medication #1:  ASCENSIA CONTOUR TEST  STRP Test blood sugar 2x/day-before and 1-2 hours after one meal daily  Medication #2:  ULTRAM 50 MG TABS take 1 tab every 4-6 hours as needed for pain  Method Requested: Electronic Initial call taken by: Sander Nephew RN,  November 16, 2009 2:31 PM  Follow-up for Phone Call        Refill approved-nurse to complete Follow-up by: Jolene Provost MD,  November 16, 2009 3:06 PM    Prescriptions: ULTRAM 50 MG TABS (TRAMADOL HCL) take 1 tab every 4-6 hours as needed for pain  #30 x 0   Entered and Authorized by:   Jolene Provost MD   Signed by:   Jolene Provost MD on 11/16/2009   Method used:   Electronically to        [facility] (mail-order)       [place]  [address]       Ph: [phone]       Fax: [phone]   [id number] ASCENSIA CONTOUR TEST  STRP (GLUCOSE BLOOD) Test blood sugar 2x/day-before and 1-2 hours after one meal daily  #200 x 4   Entered and Authorized by:   Jolene Provost MD   Signed by:   Jolene Provost MD on 11/16/2009   Method used:   Electronically to        [facility] (mail-order)       [place]  [address]       Ph: [phone]       Fax: [phone]   [id number]

## 2010-02-12 NOTE — Assessment & Plan Note (Signed)
Summary: EST-CK/FU/MEDS/CFB   Vital Signs:  Patient profile:   70 year old female Height:      61 inches (154.94 cm) Weight:      143.0 pounds (65 kg) BMI:     27.12 Temp:     97.6 degrees F (36.44 degrees C) oral Pulse rate:   66 / minute BP sitting:   121 / 65  (right arm)  Vitals Entered By: Mateo Flow Deborra Medina) (June 13, 2009 8:53 AM) CC: routine f/u, questionable mole or skin problem on back, med refill Is Patient Diabetic? Yes Did you bring your meter with you today? Yes Pain Assessment Patient in pain? no      Nutritional Status BMI of 25 - 29 = overweight  Have you ever been in a relationship where you felt threatened, hurt or afraid?No   Does patient need assistance? Functional Status Self care Ambulation Normal   Primary Care Provider:  Katha Cabal MD  CC:  routine f/u, questionable mole or skin problem on back, and med refill.  History of Present Illness: [name] is a 70 yo F with PMH of CAD s/p CABG 2006, with EF 30-35% followed by Dr. Doylene Canard, DM and HTN who presents for follow-up. She was last seen in clinic March 2011, at which time her Lantus dose was not changed.   She has been checking her CBGs in the AM, which are pretty well-controlled according to her meter log.  She also says she has been trying to walk more and feels like her blood sugar is under better control when she does.   She would like her mole located on her back to be evaluated. She states her friend had noticed that it looked "different" from before, but she does not know if it has increased in size, color, width, etc. It is not tender, and does not cause any discomfort.   She denies CP, cough, SOB, orthopnea, PND, abdominal pain, N/V, changes in bowel habits, changes in urinary habits, and no recent hypoglycemic episodes.    Preventive Screening-Counseling & Management  Alcohol-Tobacco     Alcohol drinks/day: 0     Smoking Status: never  Current Medications (verified): 1)   Aspirin 325 Mg  Tabs (Aspirin) .... Take 1 Tablet By Mouth Once A Day 2)  Nexium 20 Mg Cpdr (Esomeprazole Magnesium) .... Take 1 Tablet By Mouth Once A Day 3)  Calcium 600/vitamin D 600-200 Mg-Unit  Tabs (Calcium Carbonate-Vitamin D) .... Take 1 Tablet By Mouth Two Times A Day 4)  Furosemide 40 Mg  Tabs (Furosemide) .... Take 1 Tablet By Mouth Once A Day 5)  Klor-Con M10 10 Meq  Tbcr (Potassium Chloride Crys Cr) .... Take 1 Tablet By Mouth Once A Day 6)  Glipizide 10 Mg Tabs (Glipizide) .... Take 1 Tablet By Mouth Twice Daily Before Meals 7)  Crestor 40 Mg Tabs (Rosuvastatin Calcium) .... Take 1 Tablet By Mouth Once A Day 8)  Nitroquick 0.4 Mg Subl (Nitroglycerin) .... Take 1 Tablet Sublingually For Chest Pain 9)  Tylenol Arthritis Pain 650 Mg Tbcr (Acetaminophen) .... Take 1 Tablet By Mouth Every 4 Hours As Needed For Pain 10)  Ascensia Contour Test  Strp (Glucose Blood) .... Test Blood Sugar 2x/day-Before and 1-2 Hours After One Meal Daily 11)  Bd Ultra-Fine 33 Lancets  Misc (Lancets) .... Test Cbg Two Times A Day 12)  [name] 50 Mg Tabs (Sitagliptin Phosphate) .... Take 1 Tablet By Mouth Once A Day 13)  Diovan 320 Mg  Tabs (Valsartan) .... Take 1 Tablet By Mouth Once A Day 14)  Lantus 100 Unit/ml Soln (Insulin Glargine) .... Inject 43 Units Subcutaneously At Bedtime 15)  Dovonex 0.005 %  Soln (Calcipotriene) .... Apply Topically Two Times A Day To Affected Areas 16)  Bd Insulin Syringe Ultrafine 31g X 5/16" 0.5 Ml  Misc (Insulin Syringe-Needle U-100) .... To Inject Lantus Once Nightly 17)  Hydrocortisone 1 % Crea (Hydrocortisone) .... Apply To Affected Areas Two Times A Day.  Allergies (verified): No Known Drug Allergies  Review of Systems      See HPI  Physical Exam  General:  alert and well-developed.   Head:  normocephalic and atraumatic.   Eyes:  vision grossly intact, pupils equal, pupils round, pupils reactive to light, corneas and lenses clear, and no injection.   Mouth:   pharynx pink and moist.   Neck:  supple, full ROM, and no masses.   Lungs:  normal respiratory effort, normal breath sounds, and no crackles.   Heart:  normal rate, regular rhythm, no murmur, no gallop, no rub, and no JVD.   Abdomen:  soft, non-tender, normal bowel sounds, and no distention.   Msk:  normal ROM, no joint tenderness, no joint swelling, and no joint warmth.   Pulses:  R radial normal, R dorsalis pedis normal, L radial normal, and L dorsalis pedis normal.   Extremities:  no LE edema noted  Neurologic:  alert & oriented X3.   Skin:  Mole located right upper back Beige-tan in color 1-2 cm in size circumscribed   Diabetes Management Exam:    Foot Exam (with socks and/or shoes not present):       Sensory-Pinprick/Light touch:          Left medial foot (L-4): normal          Left dorsal foot (L-5): normal          Left lateral foot (S-1): normal          Right medial foot (L-4): normal          Right dorsal foot (L-5): normal          Right lateral foot (S-1): normal       Sensory-Monofilament:          Left foot: normal          Right foot: normal       Inspection:          Left foot: normal          Right foot: normal       Nails:          Left foot: normal          Right foot: normal   Impression & Recommendations:  Problem # 1:  DIABETES MELLITUS, TYPE II (ICD-250.00) Assessment Unchanged  Patient's A1C 7.8, unchanged from prior A1C. As previously mentioned in prior [facility] notes, patient's fasting blood sugars appear well controlled, between 80-150, however A1C has not changed despite increase in Lantus to 43 units. I do not feel inclined to increase her Lantus any further, as I believe her post-prandial blood sugars are elevated and thus contributing to her unchanged A1C. However despite strong efforts to encourage patient to check her blood sugars in evening, she has not been doing so. As discussed with Dr. Stann Mainland, patient is already on a sulfonylurea and januvia,  and another option would be starting meal coverage. This would be hard to enforce given her hx of not  checking her blood sugars more than once a day. I addressed this with the patient and she prefers not to start meal coverage with insulin at this time. I also advised pt that if her A1C continues to rise that we will readdress meal coverage with insulin. Patient agreed.   Will check urine creatinine and albumin.   Her updated medication list for this problem includes:    Aspirin 325 Mg Tabs (Aspirin) .Marland Kitchen... Take 1 tablet by mouth once a day    Glipizide 10 Mg Tabs (Glipizide) .Marland Kitchen... Take 1 tablet by mouth twice daily before meals    [name] 50 Mg Tabs (Sitagliptin phosphate) .Marland Kitchen... Take 1 tablet by mouth once a day    Diovan 320 Mg Tabs (Valsartan) .Marland Kitchen... Take 1 tablet by mouth once a day    Lantus 100 Unit/ml Soln (Insulin glargine) ..... Inject 43 units subcutaneously at bedtime    Lantus 100 Unit/ml Soln (Insulin glargine) .Marland KitchenMarland KitchenMarland KitchenMarland Kitchen 43 units in evening  Orders: T-Hgb A1C (in-house) [id number]) T- Capillary Blood Glucose [id number]) T-Urine Microalbumin w/creat. ratio [phone])  Labs Reviewed: Creat: 1.46 (03/21/2009)    Reviewed HgBA1c results: 7.8 (06/13/2009)  7.9 (01/29/2009)  Problem # 2:  HYPERTENSION (ICD-401.9) Assessment: Improved BP well controlled, will continue current regimen. Renal function checked 03/2009, wnl.   Her updated medication list for this problem includes:    Furosemide 40 Mg Tabs (Furosemide) .Marland Kitchen... Take 1 tablet by mouth once a day    Diovan 320 Mg Tabs (Valsartan) .Marland Kitchen... Take 1 tablet by mouth once a day  BP today: 121/65 Prior BP: 123/70 (03/21/2009)  Labs Reviewed: K+: 3.9 (03/21/2009) Creat: : 1.46 (03/21/2009)   Chol: 158 (02/28/2009)   HDL: 48 (02/28/2009)   LDL: 68 (02/28/2009)   TG: 210 (02/28/2009)  Problem # 3:  CORONARY ARTERY DISEASE (ICD-414.00) Assessment: Comment Only No recent CP, follows with Dr. Doylene Canard as needed basis. Continue current  regimen.   Her updated medication list for this problem includes:    Aspirin 325 Mg Tabs (Aspirin) .Marland Kitchen... Take 1 tablet by mouth once a day    Furosemide 40 Mg Tabs (Furosemide) .Marland Kitchen... Take 1 tablet by mouth once a day    Nitroquick 0.4 Mg Subl (Nitroglycerin) .Marland Kitchen... Take 1 tablet sublingually for chest pain    Diovan 320 Mg Tabs (Valsartan) .Marland Kitchen... Take 1 tablet by mouth once a day  Problem # 4:  HYPERLIPIDEMIA (ICD-272.4) Assessment: Comment Only Lipids are well controlled. Will continue current regimen.   Her updated medication list for this problem includes:    Crestor 40 Mg Tabs (Rosuvastatin calcium) .Marland Kitchen... Take 1 tablet by mouth once a day  Labs Reviewed: SGOT: 18 (02/28/2009)   SGPT: 14 (02/28/2009)   HDL:48 (02/28/2009), 41 (06/14/2008)  LDL:68 (02/28/2009), 70 (06/14/2008)  Chol:158 (02/28/2009), 152 (06/14/2008)  Trig:210 (02/28/2009), 205 (06/14/2008)  Problem # 5:  MOLE (ICD-216.9) Assessment: Comment Only Located upper back, right side. Patient would not like to see dermatologist at this time. I advised patient to pay close attention to mole for any changes in size, shape or color. Pt agreed.   Complete Medication List: 1)  Aspirin 325 Mg Tabs (Aspirin) .... Take 1 tablet by mouth once a day 2)  Nexium 20 Mg Cpdr (Esomeprazole magnesium) .... Take 1 tablet by mouth once a day 3)  Calcium 600/vitamin D 600-200 Mg-unit Tabs (Calcium carbonate-vitamin d) .... Take 1 tablet by mouth two times a day 4)  Furosemide 40 Mg Tabs (Furosemide) .... Take 1 tablet by mouth  once a day 5)  Klor-con M10 10 Meq Tbcr (Potassium chloride crys cr) .... Take 1 tablet by mouth once a day 6)  Glipizide 10 Mg Tabs (Glipizide) .... Take 1 tablet by mouth twice daily before meals 7)  Crestor 40 Mg Tabs (Rosuvastatin calcium) .... Take 1 tablet by mouth once a day 8)  Nitroquick 0.4 Mg Subl (Nitroglycerin) .... Take 1 tablet sublingually for chest pain 9)  Tylenol Arthritis Pain 650 Mg Tbcr  (Acetaminophen) .... Take 1 tablet by mouth every 4 hours as needed for pain 10)  Ascensia Contour Test Strp (Glucose blood) .... Test blood sugar 2x/day-before and 1-2 hours after one meal daily 11)  Bd Ultra-fine 33 Lancets Misc (Lancets) .... Test cbg two times a day 12)  [name] 50 Mg Tabs (Sitagliptin phosphate) .... Take 1 tablet by mouth once a day 13)  Diovan 320 Mg Tabs (Valsartan) .... Take 1 tablet by mouth once a day 14)  Lantus 100 Unit/ml Soln (Insulin glargine) .... Inject 43 units subcutaneously at bedtime 15)  Dovonex 0.005 % Soln (Calcipotriene) .... Apply topically two times a day to affected areas 16)  Lantus 100 Unit/ml Soln (Insulin glargine) .... 43 units in evening 17)  Hydrocortisone 1 % Crea (Hydrocortisone) .... Apply to affected areas two times a day.  Patient Instructions: 1)  Please schedule a follow-up appointment in 3 months. 2)  Check your blood sugars two times regularly. If your readings are usually above 180 : or below 70 you should contact our office. 3)  See your eye doctor yearly to check for diabetic eye damage. 4)  It is important that you exercise regularly at least 20 minutes 5 times a week. If you develop chest pain, have severe difficulty breathing, or feel very tired , stop exercising immediately and seek medical attention. Prescriptions: ASPIRIN 325 MG  TABS (ASPIRIN) Take 1 tablet by mouth once a day  #31 x 11   Entered and Authorized by:   Jolene Provost MD   Signed by:   Jolene Provost MD on 06/13/2009   Method used:   Telephoned to ...       [facility]* (mail-order)       [place]  [address]       Ph: [phone]       Fax: [phone]   [id number] LANTUS 100 UNIT/ML SOLN (INSULIN GLARGINE) 43 units in evening  #3 pens x 0   Entered and Authorized by:   Jolene Provost MD   Signed by:   Jolene Provost MD on 06/13/2009   Method used:   Telephoned to ...       [facility]* (mail-order)       [place]  [address]       Ph: [phone]       Fax: [phone]   RxID:   [id number] DIOVAN 320 MG TABS (VALSARTAN) Take 1 tablet by mouth once a day  #90 x 3   Entered and Authorized by:   Jolene Provost MD   Signed by:   Jolene Provost MD on 06/13/2009   Method used:   Telephoned to ...       [facility]* (mail-order)       [place]  [address]  Ph: [phone]       Fax: [phone]   [id number] [name] 50 MG TABS (SITAGLIPTIN PHOSPHATE) Take 1 tablet by mouth once a day  #31 x 0   Entered and Authorized by:   Jolene Provost MD   Signed by:   Jolene Provost MD on 06/13/2009   Method used:   Telephoned to ...       [facility]* (mail-order)       [place]  [address]       Ph: [phone]       Fax: [phone]   RxID:   [id number] CRESTOR 40 MG TABS (ROSUVASTATIN CALCIUM) Take 1 tablet by mouth once a day  #30 x 3   Entered and Authorized by:   Jolene Provost MD   Signed by:   Jolene Provost MD on 06/13/2009   Method used:   Telephoned to ...       [facility]* (mail-order)       [place], [place]  [address]       Ph: [phone]       Fax: [phone]   RxID:   [id number] KLOR-CON M10 10 MEQ  TBCR (POTASSIUM CHLORIDE CRYS CR) Take 1 tablet by mouth once a day  #30 x 6   Entered and Authorized by:   Jolene Provost MD   Signed by:   Jolene Provost MD on 06/13/2009   Method used:   Telephoned to ...       [facility]* (mail-order)       [place]  [address]       Ph: [phone]       Fax: [phone]   [id number] FUROSEMIDE 40 MG  TABS (FUROSEMIDE) Take 1 tablet by mouth once a day  #30 x 6   Entered and Authorized by:   Jolene Provost MD   Signed by:   Jolene Provost MD on 06/13/2009   Method used:    Telephoned to ...       [facility]* (mail-[place])       [place]  [address]       Ph: [phone]       Fax: [phone]   [id number] CALCIUM 600/VITAMIN D 600-200 MG-UNIT  TABS (CALCIUM CARBONATE-VITAMIN D) Take 1 tablet by mouth two times a day  #62 x 11   Entered and Authorized by:   Jolene Provost MD   Signed by:   Jolene Provost MD on 06/13/2009   Method used:   Telephoned to ...       [facility]* (mail-order)       [place]  [address]       Ph: [phone]       Fax: [phone]   [id number] NEXIUM 20 MG CPDR (ESOMEPRAZOLE MAGNESIUM) Take 1 tablet by mouth once a day  #30 x 3   Entered and Authorized by:   Jolene Provost MD   Signed by:   Jolene Provost MD on 06/13/2009   Method used:   Telephoned to ...       [facility]* (mail-order)  [address] [place] [address]  [address]       Ph: [phone]       Fax: [phone]   [id number]   Prevention & Chronic Care Immunizations   Influenza vaccine: Fluvax MCR  (10/03/2008)   Influenza vaccine due: 09/13/2009    Tetanus booster: Not documented    Pneumococcal vaccine: Pneumovax  (10/26/2006)    H. zoster vaccine: Not documented  Colorectal Screening   Hemoccult: Negative  (09/07/2003)    Colonoscopy: 6 polyps removed. 1 polyp in descending colon:  6 mm, sessile several polyps cecum to ascending colon:  2-59mm ?polyp in appendix:  biopsy pending If it is an appendiceal polyp, may need appendectomy, surgical removal Results:  Diverticulosis.         (11/06/2006)   Colonoscopy action/deferral: Repeat colonoscopy in 3 years.     (11/06/2006)   Colonoscopy due: 11/05/2009  Other Screening   Pap smear: Not documented   Pap smear action/deferral: Deferred  (10/03/2008)    Mammogram: ASSESSMENT: Negative - BI-RADS 1^MM DIGITAL SCREENING  (09/14/2008)    Mammogram action/deferral: Screening mammogram in 1 year.     (09/02/2007)   Mammogram due: 09/01/2009    DXA bone density scan: Not documented   DXA bone density action/deferral: Ordered  (10/03/2008)   Smoking status: never  (06/13/2009)  Diabetes Mellitus   HgbA1C: 7.8  (06/13/2009)    Eye exam: Not documented    Foot exam: yes  (06/13/2009)   High risk foot: No  (08/18/2007)   Foot care education: Done  (08/18/2007)   Foot exam due: 06/14/2009    Urine microalbumin/creatinine ratio: 50.6  (03/31/2008)   Urine microalbumin/cr due: 03/31/2009  Lipids   Total Cholesterol: 158  (02/28/2009)   Lipid panel action/deferral: Lipid Panel ordered   LDL: 68  (02/28/2009)   LDL Direct: Not documented   HDL: 48  (02/28/2009)   Triglycerides: 210  (02/28/2009)    SGOT (AST): 18  (02/28/2009)   BMP action: Ordered   SGPT (ALT): 14  (02/28/2009)   Alkaline phosphatase: 71  (02/28/2009)   Total bilirubin: 0.4  (02/28/2009)  Hypertension   Last Blood Pressure: 121 / 65  (06/13/2009)   Serum creatinine: 1.46  (03/21/2009)   BMP action: Ordered   Serum potassium 3.9  (03/21/2009)  Self-Management Support :   Personal Goals (by the next clinic visit) :     Personal A1C goal: 6  (02/28/2009)     Personal blood pressure goal: 130/80  (02/28/2009)     Personal LDL goal: 70  (02/28/2009)    Patient will work on the following items until the next clinic visit to reach self-care goals:     Medications and monitoring: take my medicines every day, check my blood sugar  (06/13/2009)     Eating: eat foods that are low in salt, eat baked foods instead of fried foods  (06/13/2009)     Activity: take a 30 minute walk every day  (06/13/2009)    Diabetes self-management support: Copy of home glucose meter record, Pre-printed educational material, Resources for patients handout, Written self-care plan  (06/13/2009)   Diabetes care plan printed    Hypertension self-management support: Copy of  home glucose meter record, Pre-printed educational material, Resources for patients handout, Written self-care plan  (06/13/2009)   Hypertension self-care plan printed.    Lipid self-management support: Copy of home glucose meter record, Pre-printed educational material, Resources for patients handout,  Written self-care plan  (06/13/2009)   Lipid self-care plan printed.      Resource handout printed. Process Orders Check Orders Results:     [facility]: Check successful Tests Sent for requisitioning (June 13, 2009 11:10 AM):     06/13/2009: [facility] -- T-Urine Microalbumin w/creat. ratio [[id number]] (signed)    Laboratory Results   Blood Tests   Date/Time Received: June 13, 2009 9:25 AM  Date/Time Reported: Maryan Rued  June 13, 2009 9:25 AM   HGBA1C: 7.8%   (Normal Range: Non-Diabetic - 3-6%   Control Diabetic - 6-8%) CBG Fasting:: 216mg /dL     Process Orders Check Orders Results:     [facility]: Check successful Tests Sent for requisitioning (June 13, 2009 11:10 AM):     06/13/2009: [facility] -- T-Urine Microalbumin w/creat. ratio [[id number]] (signed)    Appended Document: lantus solostar rx//kg    Clinical Lists Changes  Medications: Added new medication of LANTUS SOLOSTAR 100 UNIT/ML SOLN (INSULIN GLARGINE) inject 43 units in the evening - Signed Added new medication of PEN NEEDLES 31G X 6 MM MISC (INSULIN PEN NEEDLE) Use once daily with the Lantus solostar to inject insulin - Signed Rx of LANTUS SOLOSTAR 100 UNIT/ML SOLN (INSULIN GLARGINE) inject 43 units in the evening;  #1 box x 0;  Signed;  Entered by: Mateo Flow (AAMA);  Authorized by: Jolene Provost MD;  Method used: Telephoned to [facility] [address] [place]  [address], Ph: [phone], Fax: [phone] Rx of PEN NEEDLES 31G X 6 MM MISC (INSULIN PEN NEEDLE) Use once daily with the  Lantus solostar to inject insulin;  #30 x 0;  Signed;  Entered by: Mateo Flow (AAMA);  Authorized by: Jolene Provost MD;  Method used: Telephoned to [facility]*, [address] [place]  [address], Ph: [phone], Fax: [phone]    Prescriptions: PEN NEEDLES 31G X 6 MM MISC (INSULIN PEN NEEDLE) Use once daily with the Lantus solostar to inject insulin  #30 x 0   Entered by:   Mateo Flow (Deborra Medina)   Authorized by:   Jolene Provost MD   Signed by:   Jolene Provost MD on 06/18/2009   Method used:   Telephoned to ...       [facility]* (mail-order)       [place]  [address]       Ph: [phone]       Fax: [phone]   RxID:   [id number] LANTUS SOLOSTAR 100 UNIT/ML SOLN (INSULIN GLARGINE) inject 43 units in the evening  #1 box x 0   Entered by:   Mateo Flow (Deborra Medina)   Authorized by:   Jolene Provost MD   Signed by:   Jolene Provost MD on 06/18/2009   Method used:   Telephoned to ...       [facility]* (mail-order)       [place]  [address]       Ph: [phone]       Fax: [phone]   RxID:   [id number]

## 2010-02-12 NOTE — Letter (Signed)
Summary: CONTOUR 05-02/06-01 BLOOD SUGAR  CONTOUR 05-02/06-01 BLOOD SUGAR   Imported By: Garlan Fillers 06/14/2009 13:44:56  _____________________________________________________________________  External Attachment:    Type:   Image     Comment:   External Document

## 2010-02-12 NOTE — Letter (Signed)
Summary: DIABETIC METER DOWNLOAD 08/16-09/15/2011  DIABETIC METER DOWNLOAD 08/16-09/15/2011   Imported By: Enedina Finner 10/15/2009 15:46:03  _____________________________________________________________________  External Attachment:    Type:   Image     Comment:   External Document

## 2010-02-12 NOTE — Letter (Signed)
Summary: Chiropodist   Imported By: Bonner Puna 03/02/2009 14:49:50  _____________________________________________________________________  External Attachment:    Type:   Image     Comment:   External Document

## 2010-02-12 NOTE — Assessment & Plan Note (Signed)
Summary: 70MONTH F/U/EST/VS   Vital Signs:  Patient profile:   70 year old female Height:      61 inches (154.94 cm) Weight:      143.2 pounds (65.09 kg) BMI:     27.16 Temp:     98.4 degrees F (36.89 degrees C) oral Pulse rate:   63 / minute BP sitting:   110 / 70  (right arm) Cuff size:   regular  Vitals Entered By: Nadine Counts Deborra Medina) (February 28, 2009 10:45 AM) CC: pt c/o left side chest pain that started yesterday, mostly with movement   Primary Care Provider:  Katha Cabal MD  CC:  pt c/o left side chest pain that started yesterday and mostly with movement.  History of Present Illness: This is a  70 year old woman with past medical history of CAD s/p CABG in 2006, EF 30-35% (06/2008) followed by Dr. Doylene Canard, DM, HLD, HTN, Psoriasis and OA who is here today for f/u regarding DM and lab results:  1) DM - Last A1C was elevated at 7.9, however CBGs were well controlled under 200. I asked patient to check her blood glucose throughout the day to determine if pre-prandiol and post-prandiol sugars were elevated and patient only checked a mid-day and evening glucose twice in last month, and both readings were greater than 170.   2) ARF - BUN and Cr elevated at 30 and 1.85. In reviewing her renal function, patient's Cr does fluctuate between 1.5-1.9, however most of the readings were less than 1.5. Pt's Lasix was increased to daily as opposed to 3 times a week due to BP not at goal. Pt had a spike in Cr from 1.6 to 1.99, but upon recheck in July, it had normalized back to baseline and thus her regimen was not changed.    Preventive Screening-Counseling & Management  Alcohol-Tobacco     Alcohol drinks/day: 0     Smoking Status: never  Current Medications (verified): 1)  Aspirin 325 Mg  Tabs (Aspirin) .... Take 1 Tablet By Mouth Once A Day 2)  Nexium 20 Mg Cpdr (Esomeprazole Magnesium) .... Take 1 Tablet By Mouth Once A Day 3)  Calcium 600/vitamin D 600-200 Mg-Unit  Tabs (Calcium  Carbonate-Vitamin D) .... Take 1 Tablet By Mouth Two Times A Day 4)  Furosemide 40 Mg  Tabs (Furosemide) .... Take 1 Tablet By Mouth Once A Day 5)  Klor-Con M10 10 Meq  Tbcr (Potassium Chloride Crys Cr) .... Take 1 Tablet By Mouth Once A Day 6)  Glipizide 10 Mg Tabs (Glipizide) .... Take 1 Tablet By Mouth Twice Daily Before Meals 7)  Crestor 40 Mg Tabs (Rosuvastatin Calcium) .... Take 1 Tablet By Mouth Once A Day 8)  Nitroquick 0.4 Mg Subl (Nitroglycerin) .... Take 1 Tablet Sublingually For Chest Pain 9)  Tylenol Arthritis Pain 650 Mg Tbcr (Acetaminophen) .... Take 1 Tablet By Mouth Every 4 Hours As Needed For Pain 10)  Ascensia Contour Test  Strp (Glucose Blood) .... Test Blood Sugar 2x/day-Before and 1-2 Hours After One Meal Daily 11)  Bd Ultra-Fine 33 Lancets  Misc (Lancets) .... Test Cbg Two Times A Day 12)  [name] 50 Mg Tabs (Sitagliptin Phosphate) .... Take 1 Tablet By Mouth Once A Day 13)  Diovan 320 Mg Tabs (Valsartan) .... Take 1 Tablet By Mouth Once A Day 14)  Lantus 100 Unit/ml Soln (Insulin Glargine) .... Inject 40 Units Subcutaneously At Bedtime 15)  Dovonex 0.005 %  Soln (Calcipotriene) .... Apply Topically Two  Times A Day To Affected Areas 16)  Bd Insulin Syringe Ultrafine 31g X 5/16" 0.5 Ml  Misc (Insulin Syringe-Needle U-100) .... To Inject Lantus Once Nightly 17)  Hydrocortisone 1 % Crea (Hydrocortisone) .... Apply To Affected Areas Two Times A Day.  Allergies (verified): No Known Drug Allergies  Review of Systems General:  Denies fever. CV:  Denies chest pain or discomfort, difficulty breathing at night, lightheadness, palpitations, and shortness of breath with exertion. Resp:  Denies chest pain with inspiration, cough, and shortness of breath. GI:  Denies abdominal pain, constipation, diarrhea, nausea, and vomiting.  Physical Exam  General:  alert and well-developed.  alert and well-developed.   Head:  normocephalic and atraumatic.  normocephalic and atraumatic.     Eyes:  vision grossly intact, pupils equal, pupils round, and pupils reactive to light.  vision grossly intact, pupils equal, pupils round, and pupils reactive to light.   Mouth:  good dentition.  good dentition.   Neck:  supple, full ROM, no masses, no thyromegaly, and no JVD.  supple, full ROM, no masses, no thyromegaly, and no JVD.   Lungs:  normal respiratory effort, normal breath sounds, and no crackles.  normal respiratory effort, normal breath sounds, and no crackles.   Heart:  normal rate, regular rhythm, no murmur, no gallop, no rub, and no JVD.  normal rate, regular rhythm, no murmur, no gallop, no rub, and no JVD.   Abdomen:  soft, non-tender, normal bowel sounds, and no distention.  soft, non-tender, normal bowel sounds, and no distention.   Msk:  normal ROM.  normal ROM.   Pulses:  R radial normal and L radial normal.  R radial normal and L radial normal.   Extremities:  1+ left pedal edema and 1+ right pedal edema.  1+ left pedal edema.   Neurologic:  alert & oriented X3 and cranial nerves II-XII intact.  alert & oriented X3 and cranial nerves II-XII intact.     Impression & Recommendations:  Problem # 1:  DIABETES MELLITUS, TYPE II (ICD-250.00) Assessment Unchanged  As mentioned in HPI, pt's A1C has worsened. Lantus was not adjusted at last visit. Will increase Lantus to 43 units, and keep all other medications at current dose.   Her updated medication list for this problem includes:    Aspirin 325 Mg Tabs (Aspirin) .Marland Kitchen... Take 1 tablet by mouth once a day    Glipizide 10 Mg Tabs (Glipizide) .Marland Kitchen... Take 1 tablet by mouth twice daily before meals    [name] 50 Mg Tabs (Sitagliptin phosphate) .Marland Kitchen... Take 1 tablet by mouth once a day    Diovan 320 Mg Tabs (Valsartan) .Marland Kitchen... Take 1 tablet by mouth once a day    Lantus 100 Unit/ml Soln (Insulin glargine) ..... Inject 40 units subcutaneously at bedtime  Labs Reviewed: Creat: 1.85 (01/29/2009)    Reviewed HgBA1c results: 7.9  (01/29/2009)  7.2 (10/03/2008)  Problem # 2:  RENAL INSUFFICIENCY (ICD-588.9) Assessment: Deteriorated As previously mentioned patient's baseline Cr is between 1.25-1.6. Patient has had instances where Cr bumped to 1.99, however it had normalized without any medication changes. I will check renal function, and depending on the results will adjust anti-hypertensive regimen accordingly. If renal failure persists, I will decrease Valsartan, and keep Lasix as is, given patient's EF of 30-35%, and pitting edema. Will also have patient return in 2 weeks to recheck renal function.   Problem # 3:  HYPERTENSION (ICD-401.9) Assessment: Improved BP well controlled, will continue current regimen for now. Please  refer to assessment of renal insufficiency for more detail.   Her updated medication list for this problem includes:    Furosemide 40 Mg Tabs (Furosemide) .Marland Kitchen... Take 1 tablet by mouth once a day    Diovan 320 Mg Tabs (Valsartan) .Marland Kitchen... Take 1 tablet by mouth once a day  BP today: 110/70 Prior BP: 103/61 (01/29/2009)  Labs Reviewed: K+: 3.9 (01/29/2009) Creat: : 1.85 (01/29/2009)   Chol: 152 (06/14/2008)   HDL: 41 (06/14/2008)   LDL: 70 (06/14/2008)   TG: 205 (06/14/2008)  Problem # 4:  HYPERLIPIDEMIA (ICD-272.4) Assessment: Improved Lipids well controlled. Will check lipid panel and LFTs today.   Her updated medication list for this problem includes:    Crestor 40 Mg Tabs (Rosuvastatin calcium) .Marland Kitchen... Take 1 tablet by mouth once a day  Orders: T-Lipid Profile [id number])  Labs Reviewed: SGOT: 16 (06/14/2008)   SGPT: 14 (06/14/2008)   HDL:41 (06/14/2008), 43 (03/31/2008)  LDL:70 (06/14/2008), 116 (03/31/2008)  Chol:152 (06/14/2008), 196 (03/31/2008)  Trig:205 (06/14/2008), 186 (03/31/2008)  Complete Medication List: 1)  Aspirin 325 Mg Tabs (Aspirin) .... Take 1 tablet by mouth once a day 2)  Nexium 20 Mg Cpdr (Esomeprazole magnesium) .... Take 1 tablet by mouth once a day 3)  Calcium  600/vitamin D 600-200 Mg-unit Tabs (Calcium carbonate-vitamin d) .... Take 1 tablet by mouth two times a day 4)  Furosemide 40 Mg Tabs (Furosemide) .... Take 1 tablet by mouth once a day 5)  Klor-con M10 10 Meq Tbcr (Potassium chloride crys cr) .... Take 1 tablet by mouth once a day 6)  Glipizide 10 Mg Tabs (Glipizide) .... Take 1 tablet by mouth twice daily before meals 7)  Crestor 40 Mg Tabs (Rosuvastatin calcium) .... Take 1 tablet by mouth once a day 8)  Nitroquick 0.4 Mg Subl (Nitroglycerin) .... Take 1 tablet sublingually for chest pain 9)  Tylenol Arthritis Pain 650 Mg Tbcr (Acetaminophen) .... Take 1 tablet by mouth every 4 hours as needed for pain 10)  Ascensia Contour Test Strp (Glucose blood) .... Test blood sugar 2x/day-before and 1-2 hours after one meal daily 11)  Bd Ultra-fine 33 Lancets Misc (Lancets) .... Test cbg two times a day 12)  [name] 50 Mg Tabs (Sitagliptin phosphate) .... Take 1 tablet by mouth once a day 13)  Diovan 320 Mg Tabs (Valsartan) .... Take 1 tablet by mouth once a day 14)  Lantus 100 Unit/ml Soln (Insulin glargine) .... Inject 40 units subcutaneously at bedtime 15)  Dovonex 0.005 % Soln (Calcipotriene) .... Apply topically two times a day to affected areas 16)  Bd Insulin Syringe Ultrafine 31g X 5/16" 0.5 Ml Misc (Insulin syringe-needle u-100) .... To inject lantus once nightly 17)  Hydrocortisone 1 % Crea (Hydrocortisone) .... Apply to affected areas two times a day.  Other Orders: T-Comprehensive Metabolic Panel ([id number])  Patient Instructions: 1)  Please schedule a follow-up appointment in 2 weeks. 2)  Please take 43 units of Lantus subcutaneously at bedtime.  3)  Your physician will call you regarding your lab results. Depending on the results your physician may change your medication regimen.  Prescriptions: DOVONEX 0.005 %  SOLN (CALCIPOTRIENE) Apply topically two times a day to affected areas  #1 month x 3   Entered and Authorized by:    Jolene Provost MD   Signed by:   Jolene Provost MD on 02/28/2009   Method used:   Printed then faxed to ...       [facility]* (mail-order)  [place]  [address]       Ph: [phone]       Fax: [phone]   [id number] CRESTOR 40 MG TABS (ROSUVASTATIN CALCIUM) Take 1 tablet by mouth once a day  #30 x 3   Entered and Authorized by:   Jolene Provost MD   Signed by:   Jolene Provost MD on 02/28/2009   Method used:   Printed then faxed to ...       [facility]* ([place]  [address]       Ph: [phone]       Fax: [phone]   RxID:   [id number] [name] 50 MG TABS (SITAGLIPTIN PHOSPHATE) Take 1 tablet by mouth once a day  #31 x 0   Entered and Authorized by:   Jolene Provost MD   Signed by:   Jolene Provost MD on 02/28/2009   Method used:   Print then Give to Patient   RxID:   [id number]   Prevention & Chronic Care Immunizations   Influenza vaccine: Fluvax MCR  (10/03/2008)   Influenza vaccine due: 09/13/2009    Tetanus booster: Not documented    Pneumococcal vaccine: Pneumovax  (10/26/2006)    H. zoster vaccine: Not documented  Colorectal Screening   Hemoccult: Negative  (09/07/2003)    Colonoscopy: 6 polyps removed. 1 polyp in descending colon:  6 mm, sessile several polyps cecum to ascending colon:  2-69mm ?polyp in appendix:  biopsy pending If it is an appendiceal polyp, may need appendectomy, surgical removal Results:  Diverticulosis.         (11/06/2006)   Colonoscopy action/deferral: Repeat colonoscopy in 3 years.     (11/06/2006)   Colonoscopy due: 11/05/2009  Other Screening   Pap smear: Not documented   Pap smear action/deferral: Deferred  (10/03/2008)    Mammogram: ASSESSMENT: Negative - BI-RADS 1^MM DIGITAL SCREENING  (09/14/2008)   Mammogram action/deferral: Screening mammogram in 1 year.     (09/02/2007)   Mammogram  due: 09/01/2009    DXA bone density scan: Not documented   DXA bone density action/deferral: Ordered  (10/03/2008)  Reports requested:  Smoking status: never  (02/28/2009)  Diabetes Mellitus   HgbA1C: 7.9  (01/29/2009)    Eye exam: Not documented   Last eye exam report requested.    Foot exam: yes  (01/29/2009)   High risk foot: No  (08/18/2007)   Foot care education: Done  (08/18/2007)   Foot exam due: 06/14/2009    Urine microalbumin/creatinine ratio: 50.6  (03/31/2008)   Urine microalbumin/cr due: 03/31/2009    Diabetes flowsheet reviewed?: Yes   Progress toward A1C goal: Deteriorated  Lipids   Total Cholesterol: 152  (06/14/2008)   Lipid panel action/deferral: Lipid Panel ordered   LDL: 70  (06/14/2008)   LDL Direct: Not documented   HDL: 41  (06/14/2008)   Triglycerides: 205  (06/14/2008)    SGOT (AST): 16  (06/14/2008)   BMP action: Ordered   SGPT (ALT): 14  (06/14/2008) CMP ordered    Alkaline phosphatase: 69  (06/14/2008)   Total bilirubin: 0.4  (06/14/2008)    Lipid flowsheet reviewed?: Yes   Progress toward LDL goal: At goal  Hypertension   Last Blood Pressure: 110 / 70  (02/28/2009)   Serum creatinine: 1.85  (01/29/2009)   BMP action: Ordered  Serum potassium 3.9  (01/29/2009) CMP ordered     Hypertension flowsheet reviewed?: Yes   Progress toward BP goal: At goal  Self-Management Support :   Personal Goals (by the next clinic visit) :     Personal A1C goal: 6  (02/28/2009)     Personal blood pressure goal: 130/80  (02/28/2009)     Personal LDL goal: 70  (02/28/2009)    Patient will work on the following items until the next clinic visit to reach self-care goals:     Medications and monitoring: take my medicines every day, check my blood sugar, examine my feet every day  (02/28/2009)     Eating: drink diet soda or water instead of juice or soda, eat foods that are low in salt, eat baked foods instead of fried foods  (02/28/2009)     Activity:  take a 30 minute walk every day, take the stairs instead of the elevator, park at the far end of the parking lot  (01/29/2009)    Diabetes self-management support: Copy of home glucose meter record, CBG self-monitoring log, Written self-care plan  (02/28/2009)   Diabetes care plan printed    Hypertension self-management support: Copy of home glucose meter record, CBG self-monitoring log, Written self-care plan  (02/28/2009)   Hypertension self-care plan printed.    Lipid self-management support: Written self-care plan  (02/28/2009)   Lipid self-care plan printed.   Nursing Instructions: Refer for screening diabetic eye exam (see order) Request report of last diabetic eye exam    Process Orders Check Orders Results:     [facility]: Check successful Tests Sent for requisitioning (February 28, 2009 2:00 PM):     02/28/2009: [facility] -- T-Lipid Profile [phone] (signed)     02/28/2009: [facility] -- T-Comprehensive Metabolic Panel [id number] (signed)

## 2010-02-12 NOTE — Assessment & Plan Note (Signed)
Summary: FU/SB.   Vital Signs:  Patient profile:   70 year old female Height:      61 inches (154.94 cm) Weight:      140.9 pounds (64.05 kg) BMI:     26.72 Temp:     96.9 degrees F oral Pulse rate:   68 / minute BP sitting:   131 / 70  (right arm) Cuff size:   regular  Vitals Entered By: Morrison Old RN (December 20, 2009 9:45 AM) CC: F/U visit. Med. refill. Is Patient Diabetic? Yes Did you bring your meter with you today? No Pain Assessment Patient in pain? no      Nutritional Status BMI of 25 - 29 = overweight CBG Result 285  Have you ever been in a relationship where you felt threatened, hurt or afraid?No   Does patient need assistance? Functional Status Self care Ambulation Normal   Diabetic Foot Exam Last Podiatry Exam Date: 07/23/2007 Foot Inspection Is there a history of a foot ulcer?              No Is there a foot ulcer now?              No Can the patient see the bottom of their feet?          Yes Are the shoes appropriate in style and fit?          Yes Is there swelling or an abnormal foot shape?          No Are the toenails long?                No Are the toenails thick?                No Are the toenails ingrown?              No Is there heavy callous build-up?              No Is there pain in the calf muscle (Intermittent claudication) when walking?    NoIs there a claw toe deformity?              No Is there elevated skin temperature?            No Is there limited ankle dorsiflexion?            No Is there foot or ankle muscle weakness?            No  Diabetic Foot Care Education Patient educated on appropriate care of diabetic feet.  Pulse Check          Right Foot          Left Foot Posterior Tibial:        normal            normal Dorsalis Pedis:        normal            normal  High Risk Feet? No   10-g (5.07) Semmes-Weinstein Monofilament Test Performed by: Jolene Provost MD          Right Foot          Left Foot Visual Inspection      normal            Test Control      normal         normal Site 1         normal  normal Site 2         normal         normal Site 3         normal         normal Site 4         normal         normal Site 5         normal         normal Site 6         normal         normal Site 7         normal         normal Site 8         normal         normal Site 9         normal         normal Site 10         normal         normal  Impression      normal         normal   Primary Care Provider:  Katha Cabal MD  CC:  F/U visit. Med. refill.Marland Kitchen  History of Present Illness: Ms. Routledge is a 70 yo F with PMH of CAD s/p CABG 2006, with EF 30-35% followed by Dr. Doylene Canard, DM and HTN who presents for follow-up. She was last seen in clinic Sept. 2011 for DM follow up.  1) DM - Patient has brought her blood glucose meter log and her am sugars have been running from 90-300 range. Pt denies any episodes of hypoglycemia.  2) She states she has stopped exercising at the [facility] due to colder weather, and being "lazy."  No chest pain, or shortness of breath with exertion.   She denies CP, cough, SOB, orthopnea, PND, abdominal pain, N/V, changes in bowel habits, changes in urinary habits, and no recent hypoglycemic episodes.    Depression History:      The patient denies a depressed mood most of the day and a diminished interest in her usual daily activities.         Preventive Screening-Counseling & Management  Alcohol-Tobacco     Alcohol drinks/day: 0     Smoking Status: never  Caffeine-Diet-Exercise     Does Patient Exercise: yes     Type of exercise: walking     Times/week: 2  Current Medications (verified): 1)  Aspirin 325 Mg  Tabs (Aspirin) .... Take 1 Tablet By Mouth Once A Day 2)  Nexium 20 Mg Cpdr (Esomeprazole Magnesium) .... Take 1 Tablet By Mouth Once A Day 3)  Calcium 600/vitamin D 600-200 Mg-Unit  Tabs (Calcium Carbonate-Vitamin D) .... Take 1 Tablet By Mouth Two Times A Day 4)   Furosemide 40 Mg  Tabs (Furosemide) .... Take 1 Tablet By Mouth Once A Day 5)  Klor-Con M10 10 Meq  Tbcr (Potassium Chloride Crys Cr) .... Take 1 Tablet By Mouth Once A Day 6)  Glipizide 10 Mg Tabs (Glipizide) .... Take 1 Tablet By Mouth Twice Daily Before Meals 7)  Crestor 40 Mg Tabs (Rosuvastatin Calcium) .... Take 1 Tablet By Mouth Once A Day 8)  Nitroquick 0.4 Mg Subl (Nitroglycerin) .... Take 1 Tablet Sublingually For Chest Pain 9)  Tylenol Arthritis Pain 650 Mg Tbcr (Acetaminophen) .... Take 1 Tablet By Mouth Every 4 Hours As Needed For Pain 10)  Ascensia Contour Test  Strp (Glucose Blood) .Marland KitchenMarland KitchenMarland Kitchen  Test Blood Sugar 2x/day-Before and 1-2 Hours After One Meal Daily 11)  Bd Ultra-Fine 33 Lancets  Misc (Lancets) .... Test Cbg Two Times A Day 12)  [name] 50 Mg Tabs (Sitagliptin Phosphate) .... Take 1 Tablet By Mouth Once A Day 13)  Diovan 320 Mg Tabs (Valsartan) .... Take 1 Tablet By Mouth Once A Day 14)  Lantus 100 Unit/ml Soln (Insulin Glargine) .... Inject 43 Units Subcutaneously At Bedtime 15)  Dovonex 0.005 %  Soln (Calcipotriene) .... Apply Topically Two Times A Day To Affected Areas 16)  Lantus 100 Unit/ml Soln (Insulin Glargine) .... 43 Units in Evening 17)  Hydrocortisone 1 % Crea (Hydrocortisone) .... Apply To Affected Areas Two Times A Day. 18)  Lantus Solostar 100 Unit/ml Soln (Insulin Glargine) .... Inject 43 Units in The Evening 19)  Pen Needles 31g X 6 Mm Misc (Insulin Pen Needle) .... Use Once Daily With The Lantus Solostar To Inject Insulin 20)  Ultram 50 Mg Tabs (Tramadol Hcl) .... Take 1 Tab Every 4-6 Hours As Needed For Pain  Allergies (verified): No Known Drug Allergies  Past History:  Past Medical History: Last updated: 07/27/2008 Coronary artery disease, s/p 4 vessel CABG 12/13/04 [name] followed by Dr. Doylene Canard Pleural effusion post op s/p drainage 1.2 L Osteoarthritis Myocardial infarction, hx of Hypertension DM2 HLD CRI Constipation PAD MRI: lumbar spine  6/09:  Small right lateral recess disc protrusion at L1-2 contacts the descending right L2 root.  Mild to moderate central canal narrowing at L4-5 with narrowing of both lateral recesses left worse than right facet deg. disease could impact either L5 root. psoriasis  Past Surgical History: Last updated: 10/26/2006 Coronary artery bypass graft, 4 vessel 12/13/04 PTCA/stent 09/15/01 Hysterectomy Cataract extraction: left eye:  approx. 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  other kids are all in [place].  Risk Factors: Alcohol Use: 0 (12/20/2009) Exercise: yes (12/20/2009)  Risk Factors: Smoking Status: never (12/20/2009)  Review of Systems      See HPI  Physical Exam  General:  alert and well-developed.  VS reviewed and BP wnl  Neck:  supple.   Lungs:  normal respiratory effort and normal breath sounds.   Heart:  normal rate, regular rhythm, no murmur, no gallop, and no rub.   Abdomen:  soft and non-tender.   Msk:  normal ROM.   Pulses:  pulses 2+ DP and post tibial bilaterally  Extremities:  no edema noted  Neurologic:  nonfocal   Diabetes Management Exam:    Foot Exam (with socks and/or shoes not present):       Sensory-Monofilament:          Left foot: normal          Right foot: normal   Impression & Recommendations:  Problem # 1:  DIABETES MELLITUS, TYPE II (ICD-250.00) Assessment Deteriorated A1C slightly deteriorated at 8.1. As mentioned in other office notes, her blood sugars run high after her meals. It has been discussed with pt that she would likely need to start sliding scale insulin if her DM continues to worsen. Her AM  blood sugars are between 90-120, and thus will not adjust her lantus. It is pt preference that she continue with glipizide and [place]. Will continue her current regimen and continue to  follow closely. Advised diet and exercise modification, and she says she will start going with  her friend to the [facility].   Her updated medication list for this problem includes:    Aspirin 325 Mg Tabs (Aspirin) .Marland Kitchen... Take 1 tablet by mouth once a day    Glipizide 10 Mg Tabs (Glipizide) .Marland Kitchen... Take 1 tablet by mouth twice daily before meals    [name] 50 Mg Tabs (Sitagliptin phosphate) .Marland Kitchen... Take 1 tablet by mouth once a day    Diovan 320 Mg Tabs (Valsartan) .Marland Kitchen... Take 1 tablet by mouth once a day    Lantus Solostar 100 Unit/ml Soln (Insulin glargine) ..... Inject 43 units in the evening  Orders: T- Capillary Blood Glucose (82948) T-Hgb A1C (in-house) [id number])  Problem # 2:  CORONARY ARTERY DISEASE (ICD-414.00) Assessment: Unchanged Stable, unable to follow up with cards, since she cannot afford copay. Have reviewed her regimen, and though she is on ASA and appropriate anti-hypertensives, it would be beneficial for her to be on beta blocker. She was on atenolol half of 25 mg tab daily, and had HR in 40s and thus it was discontinued. However her HR in the clinic over the past year has been in the high 60s consistently. Looking at the half life of atenolol which is once a day dosing, half life of 6-7 hours vs metoprolol which is two times a day dosing and half life of 3-4 hours, I will start pt on metoprolol once a day and monitor how she does with that.   Her updated medication list for this problem includes:    Aspirin 325 Mg Tabs (Aspirin) .Marland Kitchen... Take 1 tablet by mouth once a day    Furosemide 40 Mg Tabs (Furosemide) .Marland Kitchen... Take 1 tablet by mouth once a day    Nitroquick 0.4 Mg Subl (Nitroglycerin) .Marland Kitchen... Take 1 tablet sublingually for chest pain    Diovan 320 Mg Tabs (Valsartan) .Marland Kitchen... Take 1 tablet by mouth once a day    Metoprolol Tartrate 25 Mg Tabs (Metoprolol tartrate) .Marland Kitchen... Take 1 tablet by mouth once a day  Labs Reviewed: Chol: 158 (02/28/2009)   HDL: 48 (02/28/2009)   LDL: 68 (02/28/2009)    TG: 210 (02/28/2009)  Problem # 3:  HYPERTENSION (ICD-401.9) Assessment: Unchanged Patient on Lasix and Diovan and has good blood pressure control. Will add beta blocker for additional cardio protection, given hx of CAD and CHF. Will check BMet today.  Her updated medication list for this problem includes:    Furosemide 40 Mg Tabs (Furosemide) .Marland Kitchen... Take 1 tablet by mouth once a day    Diovan 320 Mg Tabs (Valsartan) .Marland Kitchen... Take 1 tablet by mouth once a day    Metoprolol Tartrate 25 Mg Tabs (Metoprolol tartrate) .Marland Kitchen... Take 1 tablet by mouth once a day  Orders: T-Basic Metabolic Panel (99991111)  BP today: 131/70 Prior BP: 140/78 (09/27/2009)  Labs Reviewed: K+: 4.2 (09/27/2009) Creat: : 1.21 (09/27/2009)   Chol: 158 (02/28/2009)   HDL: 48 (02/28/2009)   LDL: 68 (02/28/2009)   TG: 210 (02/28/2009)  Problem # 4:  HYPERLIPIDEMIA (ICD-272.4) Assessment: Improved Pt not fasting today. Will check FLP at next visit in 3 months. Lipids are at goal.  Her updated medication list for this problem includes:    Crestor 40 Mg Tabs (Rosuvastatin calcium) .Marland Kitchen... Take 1 tablet by mouth once a day  Labs Reviewed: SGOT: 18 (02/28/2009)   SGPT: 14 (02/28/2009)   HDL:48 (02/28/2009), 41 (06/14/2008)  LDL:68 (02/28/2009), 70 (06/14/2008)  Chol:158 (02/28/2009), 152 (06/14/2008)  Trig:210 (02/28/2009), 205 (06/14/2008)  Problem # 5:  RENAL INSUFFICIENCY (  ICD-588.9) Secondary to long-standing DM and HTN. Baseline Cr about 1.4-1.6. Will check renal function today, continue to manage HTN and DM.   Complete Medication List: 1)  Aspirin 325 Mg Tabs (Aspirin) .... Take 1 tablet by mouth once a day 2)  Nexium 20 Mg Cpdr (Esomeprazole magnesium) .... Take 1 tablet by mouth once a day 3)  Calcium 600/vitamin D 600-200 Mg-unit Tabs (Calcium carbonate-vitamin d) .... Take 1 tablet by mouth two times a day 4)  Furosemide 40 Mg Tabs (Furosemide) .... Take 1 tablet by mouth once a day 5)  Klor-con M10 10 Meq  Tbcr (Potassium chloride crys cr) .... Take 1 tablet by mouth once a day 6)  Glipizide 10 Mg Tabs (Glipizide) .... Take 1 tablet by mouth twice daily before meals 7)  Crestor 40 Mg Tabs (Rosuvastatin calcium) .... Take 1 tablet by mouth once a day 8)  Nitroquick 0.4 Mg Subl (Nitroglycerin) .... Take 1 tablet sublingually for chest pain 9)  Tylenol Arthritis Pain 650 Mg Tbcr (Acetaminophen) .... Take 1 tablet by mouth every 4 hours as needed for pain 10)  Ascensia Contour Test Strp (Glucose blood) .... Test blood sugar 2x/day-before and 1-2 hours after one meal daily 11)  Bd Ultra-fine 33 Lancets Misc (Lancets) .... Test cbg two times a day 12)  [name] 50 Mg Tabs (Sitagliptin phosphate) .... Take 1 tablet by mouth once a day 13)  Diovan 320 Mg Tabs (Valsartan) .... Take 1 tablet by mouth once a day 14)  Lantus 100 Unit/ml Soln (Insulin glargine) .... Inject 43 units subcutaneously at bedtime 15)  Dovonex 0.005 % Soln (Calcipotriene) .... Apply topically two times a day to affected areas 16)  Lantus 100 Unit/ml Soln (Insulin glargine) .... 43 units in evening 17)  Hydrocortisone 1 % Crea (Hydrocortisone) .... Apply to affected areas two times a day. 18)  Lantus Solostar 100 Unit/ml Soln (Insulin glargine) .... Inject 43 units in the evening 19)  Pen Needles 31g X 6 Mm Misc (Insulin pen needle) .... Use once daily with the lantus solostar to inject insulin 20)  Ultram 50 Mg Tabs (Tramadol hcl) .... Take 1 tab every 4-6 hours as needed for pain 21)  Metoprolol Tartrate 25 Mg Tabs (Metoprolol tartrate) .... Take 1 tablet by mouth once a day  Other Orders: T-Hemoccult Card-Multiple (take home) (82270) Dexa scan (Dexa scan)  Patient Instructions: 1)  Please schedule a follow-up appointment in 3 months. 2)  It is important that you exercise regularly at least 20 minutes 5 times a week. If you develop chest pain, have severe difficulty breathing, or feel very tired , stop exercising immediately and  seek medical attention. 3)  Please bring your glucose meter to your next appointment Prescriptions: METOPROLOL TARTRATE 25 MG TABS (METOPROLOL TARTRATE) Take 1 tablet by mouth once a day  #30 x 3   Entered and Authorized by:   Jolene Provost MD   Signed by:   Jolene Provost MD on 12/20/2009   Method used:   Faxed to ...       [facility]* (mail-order)       [place]  [address]       Ph: [phone]       Fax: [phone]   [id number] ULTRAM 50 MG TABS (TRAMADOL HCL) take 1 tab every 4-6 hours as needed for pain  #30 x 2   Entered and Authorized by:  Jolene Provost MD   Signed by:   Jolene Provost MD on 12/20/2009   Method used:   Faxed to ...       [facility]* (mail-order)       [place]  [address]       Ph: [phone]       Fax: [phone]   RxID:   [id number] PEN NEEDLES 31G X 6 MM MISC (INSULIN PEN NEEDLE) Use once daily with the Lantus solostar to inject insulin  #30 x 6   Entered and Authorized by:   Jolene Provost MD   Signed by:   Jolene Provost MD on 12/20/2009   Method used:   Faxed to ...       [facility]* (mail-order)       [place]  [address]       Ph: [phone]       Fax: [phone]   RxID:   [id number] LANTUS 100 UNIT/ML SOLN (INSULIN GLARGINE) 43 units in evening  #3 pens x 3   Entered and Authorized by:   Jolene Provost MD   Signed by:   Jolene Provost MD on 12/20/2009   Method used:   Faxed to ...       [facility]* (mail-order)       [place], [place]  [address]       Ph: [phone]       Fax: [phone]   RxID:   [id number] KLOR-CON M10 10 MEQ  TBCR (POTASSIUM CHLORIDE CRYS CR) Take 1 tablet by mouth once a day  #30 x 6   Entered and Authorized by:   Jolene Provost MD   Signed by:   Jolene Provost MD on 12/20/2009   Method used:   Faxed to ...        [facility]* (mail-order)       [place]  [address]       Ph: [phone]       Fax: [phone]   RxID:   [id number] [name] 50 MG TABS (SITAGLIPTIN PHOSPHATE) Take 1 tablet by mouth once a day  #31 x 6   Entered and Authorized by:   Jolene Provost MD   Signed by:   Jolene Provost MD on 12/20/2009   Method used:   Faxed to ...       [facility]* (mail-order)       [place]  [address]       Ph: [phone]       Fax: [phone]   RxID:   [id number] CRESTOR 40 MG TABS (ROSUVASTATIN CALCIUM) Take 1 tablet by mouth once a day  #30 x 6   Entered and Authorized by:   Jolene Provost MD   Signed by:   Jolene Provost MD on 12/20/2009   Method used:   Faxed to ...       [facility]* (mail-order)       [place]  [address]       Ph: [phone]       Fax: [phone]   RxID:   [id number] FUROSEMIDE 40 MG  TABS (FUROSEMIDE)  Take 1 tablet by mouth once a day  #30 x 6   Entered and Authorized by:   Jolene Provost MD   Signed by:   Jolene Provost MD on 12/20/2009   Method used:   Faxed to ...       [facility]* (mail-order)       [place]  [address]       Ph: [phone]       Fax: [phone]   RxID:   [id number] NEXIUM 20 MG CPDR (ESOMEPRAZOLE MAGNESIUM) Take 1 tablet by mouth once a day  #30 x 6   Entered and Authorized by:   Jolene Provost MD   Signed by:   Jolene Provost MD on 12/20/2009   Method used:   Faxed to ...       [facility]* (mail-order)       [address] [place]  [address]       Ph: [phone]       Fax: [phone]   RxID:   [id number]    Orders Added: 1)  T- Capillary Blood Glucose [82948] 2)  T-Hgb A1C (in-house) [id number] 3)  T-Hemoccult Card-Multiple (take home) [82270] 4)  T-Basic Metabolic Panel 0000000 5)   Dexa scan [Dexa scan] 6)  Est. Patient Level IV [id number]    Prevention & Chronic Care Immunizations   Influenza vaccine: Fluvax 3+  (09/27/2009)   Influenza vaccine due: 09/13/2009    Tetanus booster: Not documented    Pneumococcal vaccine: Pneumovax  (10/26/2006)    H. zoster vaccine: Not documented  Colorectal Screening   Hemoccult: Negative  (09/07/2003)   Hemoccult action/deferral: Ordered  (12/20/2009)    Colonoscopy: 6 polyps removed. 1 polyp in descending colon:  6 mm, sessile several polyps cecum to ascending colon:  2-47mm ?polyp in appendix:  biopsy pending If it is an appendiceal polyp, may need appendectomy, surgical removal Results:  Diverticulosis.         (11/06/2006)   Colonoscopy action/deferral: Refused  (12/20/2009)   Colonoscopy due: 11/05/2009  Other Screening   Pap smear: Not documented   Pap smear action/deferral: Not indicated S/P hysterectomy  (08/23/2009)    Mammogram: ASSESSMENT: Negative - BI-RADS 1^MM DIGITAL SCREENING  (09/18/2009)   Mammogram action/deferral: Screening mammogram in 1 year.     (09/02/2007)   Mammogram due: 09/01/2009    DXA bone density scan: Not documented   DXA bone density action/deferral: Ordered  (12/20/2009)   Smoking status: never  (12/20/2009)    Screening comments: Patient said she will make the GI appt for repeat colonoscopy after the holidays, and did not want [facility] to make the appt for her  Diabetes Mellitus   HgbA1C: 8.1  (12/20/2009)    Eye exam: Not documented    Foot exam: yes  (12/20/2009)   Foot exam action/deferral: Do today   High risk foot: No  (12/20/2009)   Foot care education: Done  (12/20/2009)   Foot exam due: 06/14/2009    Urine microalbumin/creatinine ratio: 81.1  (06/13/2009)   Urine microalbumin/cr due: 03/31/2009    Diabetes flowsheet reviewed?: Yes   Progress toward A1C goal: Deteriorated  Lipids   Total Cholesterol: 158  (02/28/2009)   Lipid panel action/deferral: Lipid Panel  ordered   LDL: 68  (02/28/2009)   LDL Direct: Not documented   HDL: 48  (02/28/2009)   Triglycerides: 210  (02/28/2009)  SGOT (AST): 18  (02/28/2009)   BMP action: Ordered   SGPT (ALT): 14  (02/28/2009)   Alkaline phosphatase: 71  (02/28/2009)   Total bilirubin: 0.4  (02/28/2009)    Lipid flowsheet reviewed?: Yes   Progress toward LDL goal: At goal  Hypertension   Last Blood Pressure: 131 / 70  (12/20/2009)   Serum creatinine: 1.21  (09/27/2009)   BMP action: Ordered   Serum potassium 4.2  (09/27/2009)    Hypertension flowsheet reviewed?: Yes   Progress toward BP goal: At goal  Self-Management Support :   Personal Goals (by the next clinic visit) :     Personal A1C goal: 7  (09/27/2009)     Personal blood pressure goal: 130/80  (09/27/2009)     Personal LDL goal: 70  (08/23/2009)    Patient will work on the following items until the next clinic visit to reach self-care goals:     Medications and monitoring: take my medicines every day, bring all of my medications to every visit, examine my feet every day  (12/20/2009)     Eating: drink diet soda or water instead of juice or soda, eat more vegetables, use fresh or frozen vegetables, eat foods that are low in salt, eat baked foods instead of fried foods, eat fruit for snacks and desserts  (12/20/2009)     Activity: take a 30 minute walk every day  (12/20/2009)    Diabetes self-management support: Written self-care plan  (12/20/2009)   Diabetes care plan printed    Hypertension self-management support: Written self-care plan, Education handout  (12/20/2009)   Hypertension self-care plan printed.   Hypertension education handout printed    Lipid self-management support: Written self-care plan  (12/20/2009)   Lipid self-care plan printed.   Nursing Instructions: Schedule screening DXA bone density scan (see order) Provide Hemoccult cards with instructions (see order) Diabetic foot exam today   Process Orders Check  Orders Results:     [facility]: Check successful Tests Sent for requisitioning (December 20, 2009 12:04 PM):     12/20/2009: [facility] -- T-Basic Metabolic Panel 0000000 (signed)     Laboratory Results   Blood Tests   Date/Time Received: December 20, 2009 10:02 AM Date/Time Reported: Maryan Rued  December 20, 2009 10:02 AM   HGBA1C: 8.1%   (Normal Range: Non-Diabetic - 3-6%   Control Diabetic - 6-8%) CBG Random:: 285mg /dL

## 2010-02-12 NOTE — Letter (Signed)
Summary: MeterDownLoad  MeterDownLoad   Imported By: Bonner Puna 03/22/2009 15:07:03  _____________________________________________________________________  External Attachment:    Type:   Image     Comment:   External Document

## 2010-02-12 NOTE — Assessment & Plan Note (Signed)
Summary: EST-CK/FU/MEDS/CFB   Vital Signs:  Patient profile:   70 year old female Height:      61 inches (154.94 cm) Weight:      142.2 pounds (64.64 kg) BMI:     26.97 Temp:     97.1 degrees F oral Pulse rate:   60 / minute BP sitting:   125 / 62  (right arm)  Vitals Entered By: Morrison Old RN (August 23, 2009 1:38 PM) CC: F/U visit. Med. refills.  Difficulty sleeping. Pain in legs at night and in the mornings. Is Patient Diabetic? Yes Did you bring your meter with you today? Yes Pain Assessment Patient in pain? no      Nutritional Status BMI of 25 - 29 = overweight CBG Result 204  Have you ever been in a relationship where you felt threatened, hurt or afraid?No   Does patient need assistance? Functional Status Self care Ambulation Normal   Primary Care Provider:  Katha Cabal MD  CC:  F/U visit. Med. refills.  Difficulty sleeping. Pain in legs at night and in the mornings..  History of Present Illness: [name] is a 70 yo F with PMH of CAD s/p CABG 2006, with EF 30-35% followed by Dr. Doylene Canard, DM and HTN who presents for follow-up. She was last seen in clinic June 2011 for DM follow up.  1)Patient states she has difficulty sleeping due to hip pain and arthritis. Patient states she had chronic hip pain, and occasionally bothers her. She states she had some darvocet at home and took that for the pain. She states tyelenol arthritis does not help.   2) DM - Patient has brought her blood glucose meter log and her am sugars have been running from 80-130 with a few sugars in 170-190 range. Her pm sugars have been consistently elevated at greater than 200. Pt denies any episodes of hypoglycemia.  3) She states she has been exercising at the [facility]. No chest pain, or shortness of breath with exertion.   She denies CP, cough, SOB, orthopnea, PND, abdominal pain, N/V, changes in bowel habits, changes in urinary habits, and no recent hypoglycemic episodes.    Depression  History:      The patient denies a depressed mood most of the day and a diminished interest in her usual daily activities.         Preventive Screening-Counseling & Management  Alcohol-Tobacco     Alcohol drinks/day: 0     Smoking Status: never  Caffeine-Diet-Exercise     Does Patient Exercise: yes     Type of exercise: walking     Times/week: 2  Current Medications (verified): 1)  Aspirin 325 Mg  Tabs (Aspirin) .... Take 1 Tablet By Mouth Once A Day 2)  Nexium 20 Mg Cpdr (Esomeprazole Magnesium) .... Take 1 Tablet By Mouth Once A Day 3)  Calcium 600/vitamin D 600-200 Mg-Unit  Tabs (Calcium Carbonate-Vitamin D) .... Take 1 Tablet By Mouth Two Times A Day 4)  Furosemide 40 Mg  Tabs (Furosemide) .... Take 1 Tablet By Mouth Once A Day 5)  Klor-Con M10 10 Meq  Tbcr (Potassium Chloride Crys Cr) .... Take 1 Tablet By Mouth Once A Day 6)  Glipizide 10 Mg Tabs (Glipizide) .... Take 1 Tablet By Mouth Twice Daily Before Meals 7)  Crestor 40 Mg Tabs (Rosuvastatin Calcium) .... Take 1 Tablet By Mouth Once A Day 8)  Nitroquick 0.4 Mg Subl (Nitroglycerin) .... Take 1 Tablet Sublingually For Chest Pain 9)  Tylenol  Arthritis Pain 650 Mg Tbcr (Acetaminophen) .... Take 1 Tablet By Mouth Every 4 Hours As Needed For Pain 10)  Ascensia Contour Test  Strp (Glucose Blood) .... Test Blood Sugar 2x/day-Before and 1-2 Hours After One Meal Daily 11)  Bd Ultra-Fine 33 Lancets  Misc (Lancets) .... Test Cbg Two Times A Day 12)  [name] 50 Mg Tabs (Sitagliptin Phosphate) .... Take 1 Tablet By Mouth Once A Day 13)  Diovan 320 Mg Tabs (Valsartan) .... Take 1 Tablet By Mouth Once A Day 14)  Lantus 100 Unit/ml Soln (Insulin Glargine) .... Inject 43 Units Subcutaneously At Bedtime 15)  Dovonex 0.005 %  Soln (Calcipotriene) .... Apply Topically Two Times A Day To Affected Areas 16)  Lantus 100 Unit/ml Soln (Insulin Glargine) .... 43 Units in Evening 17)  Hydrocortisone 1 % Crea (Hydrocortisone) .... Apply To Affected  Areas Two Times A Day. 18)  Lantus Solostar 100 Unit/ml Soln (Insulin Glargine) .... Inject 43 Units in The Evening 19)  Pen Needles 31g X 6 Mm Misc (Insulin Pen Needle) .... Use Once Daily With The Lantus Solostar To Inject Insulin  Allergies (verified): No Known Drug Allergies  Past History:  Past Medical History: Last updated: 07/27/2008 Coronary artery disease, s/p 4 vessel CABG 12/13/04 [name] followed by Dr. Doylene Canard Pleural effusion post op s/p drainage 1.2 L Osteoarthritis Myocardial infarction, hx of Hypertension DM2 HLD CRI Constipation PAD MRI: lumbar spine 6/09:  Small right lateral recess disc protrusion at L1-2 contacts the descending right L2 root.  Mild to moderate central canal narrowing at L4-5 with narrowing of both lateral recesses left worse than right facet deg. disease could impact either L5 root. psoriasis  Past Surgical History: Last updated: 10/26/2006 Coronary artery bypass graft, 4 vessel 12/13/04 PTCA/stent 09/15/01 Hysterectomy Cataract extraction: left eye:  approx. 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  other kids are all in [place].  Risk Factors: Alcohol Use: 0 (08/23/2009) Exercise: yes (08/23/2009)  Risk Factors: Smoking Status: never (08/23/2009)  Review of Systems      See HPI  Physical Exam  General:  alert and well-developed.   Head:  normocephalic and atraumatic.   Eyes:  vision grossly intact, pupils equal, pupils round, and pupils reactive to light.   Mouth:  good dentition and pharynx pink and moist.   Neck:  supple, full ROM, and no masses.   Lungs:  normal respiratory effort and normal breath sounds.   Heart:  normal rate, regular rhythm, no murmur, and no gallop.   Abdomen:  soft and non-tender.   Msk:  normal ROM, no joint tenderness, no joint swelling, no joint  warmth, and no redness over joints in particular the right hip joint was examined more carefully, as patient mentions having pain on that side Extremities:  no LE edema present  Neurologic:  alert & oriented X3.     Impression & Recommendations:  Problem # 1:  DIABETES MELLITUS, TYPE II (ICD-250.00) Assessment Unchanged In reviewing patient's glucose meter, her am sugars seem to correlate with appropriate Lantus coverage, however her pm sugars are consistently elevated above 200. As mentioned in last note, sliding scale coverage was encouraged to control her sugars better, however she is not inclined to do this at this time. Will follow up A1C at next visit, and reassess her regimen.   Her updated medication list for this  problem includes:    Aspirin 325 Mg Tabs (Aspirin) .Marland Kitchen... Take 1 tablet by mouth once a day    Glipizide 10 Mg Tabs (Glipizide) .Marland Kitchen... Take 1 tablet by mouth twice daily before meals    [name] 50 Mg Tabs (Sitagliptin phosphate) .Marland Kitchen... Take 1 tablet by mouth once a day    Diovan 320 Mg Tabs (Valsartan) .Marland Kitchen... Take 1 tablet by mouth once a day    Lantus Solostar 100 Unit/ml Soln (Insulin glargine) ..... Inject 43 units in the evening  Orders: Capillary Blood Glucose/CBG [id number])  Problem # 2:  HIP PAIN, BILATERAL (ICD-719.45) Assessment: Comment Only Description of pain consistent with arthritis. Pt states pain is not relieved by tylenol. Will try Voltaren Gel for pain relief. Will pursue further imaging studies if pain worsens, and uncontrolled.   Her updated medication list for this problem includes:    Aspirin 325 Mg Tabs (Aspirin) .Marland Kitchen... Take 1 tablet by mouth once a day    Tylenol Arthritis Pain 650 Mg Tbcr (Acetaminophen) .Marland Kitchen... Take 1 tablet by mouth every 4 hours as needed for pain  Problem # 3:  HYPERTENSION (ICD-401.9) Assessment: Comment Only Blood pressure is well controlled, will continue current regimen. Will check renal function at next office visit in one  month, when patient gets 3 month A1C check.   Her updated medication list for this problem includes:    Furosemide 40 Mg Tabs (Furosemide) .Marland Kitchen... Take 1 tablet by mouth once a day    Diovan 320 Mg Tabs (Valsartan) .Marland Kitchen... Take 1 tablet by mouth once a day  BP today: 125/62 Prior BP: 121/65 (06/13/2009)  Labs Reviewed: K+: 3.9 (03/21/2009) Creat: : 1.46 (03/21/2009)   Chol: 158 (02/28/2009)   HDL: 48 (02/28/2009)   LDL: 68 (02/28/2009)   TG: 210 (02/28/2009)  Complete Medication List: 1)  Aspirin 325 Mg Tabs (Aspirin) .... Take 1 tablet by mouth once a day 2)  Nexium 20 Mg Cpdr (Esomeprazole magnesium) .... Take 1 tablet by mouth once a day 3)  Calcium 600/vitamin D 600-200 Mg-unit Tabs (Calcium carbonate-vitamin d) .... Take 1 tablet by mouth two times a day 4)  Furosemide 40 Mg Tabs (Furosemide) .... Take 1 tablet by mouth once a day 5)  Klor-con M10 10 Meq Tbcr (Potassium chloride crys cr) .... Take 1 tablet by mouth once a day 6)  Glipizide 10 Mg Tabs (Glipizide) .... Take 1 tablet by mouth twice daily before meals 7)  Crestor 40 Mg Tabs (Rosuvastatin calcium) .... Take 1 tablet by mouth once a day 8)  Nitroquick 0.4 Mg Subl (Nitroglycerin) .... Take 1 tablet sublingually for chest pain 9)  Tylenol Arthritis Pain 650 Mg Tbcr (Acetaminophen) .... Take 1 tablet by mouth every 4 hours as needed for pain 10)  Ascensia Contour Test Strp (Glucose blood) .... Test blood sugar 2x/day-before and 1-2 hours after one meal daily 11)  Bd Ultra-fine 33 Lancets Misc (Lancets) .... Test cbg two times a day 12)  [name] 50 Mg Tabs (Sitagliptin phosphate) .... Take 1 tablet by mouth once a day 13)  Diovan 320 Mg Tabs (Valsartan) .... Take 1 tablet by mouth once a day 14)  Lantus 100 Unit/ml Soln (Insulin glargine) .... Inject 43 units subcutaneously at bedtime 15)  Dovonex 0.005 % Soln (Calcipotriene) .... Apply topically two times a day to affected areas 16)  Lantus 100 Unit/ml Soln (Insulin glargine)  .... 43 units in evening 17)  Hydrocortisone 1 % Crea (Hydrocortisone) .... Apply to affected areas two  times a day. 18)  Lantus Solostar 100 Unit/ml Soln (Insulin glargine) .... Inject 43 units in the evening 19)  Pen Needles 31g X 6 Mm Misc (Insulin pen needle) .... Use once daily with the lantus solostar to inject insulin 20)  Voltaren 1 % Gel (Diclofenac sodium) .... Please apply to area, 1-4 times daily as needed for pain.  Patient Instructions: 1)  Please schedule a follow up appointment in September. 2)  Check your blood sugars regularly. If your readings are usually above 180 : or below 70 you should contact our office. Please check your sugars at bedtime and bring meter to next office visit. Prescriptions: VOLTAREN 1 % GEL (DICLOFENAC SODIUM) Please apply to area, 1-4 times daily as needed for pain.  #1bottle/tube x 0   Entered and Authorized by:   Jolene Provost MD   Signed by:   Jolene Provost MD on 08/23/2009   Method used:   Printed then mailed to ...       [facility]* (mail-order)       [place]  [address]       Ph: [phone]       Fax: [phone]   [id number] PEN NEEDLES 31G X 6 MM MISC (INSULIN PEN NEEDLE) Use once daily with the Lantus solostar to inject insulin  #30 x 6   Entered and Authorized by:   Jolene Provost MD   Signed by:   Jolene Provost MD on 08/23/2009   Method used:   Printed then mailed to ...       [facility]* (mail-order)       [place]  [address]       Ph: [phone]       Fax: [phone]   RxID:   [id number] LANTUS SOLOSTAR 100 UNIT/ML SOLN (INSULIN GLARGINE) inject 43 units in the evening  #1 box x 6   Entered and Authorized by:   Jolene Provost MD   Signed by:   Jolene Provost MD on 08/23/2009   Method used:   Printed then mailed to ...       [facility]* (mail-order)       [place]  [address]       Ph: [phone]       Fax: [phone]   [id number] [name] 50 MG TABS (SITAGLIPTIN PHOSPHATE) Take 1 tablet by mouth once a day  #31 x 6   Entered and Authorized by:   Jolene Provost MD   Signed by:   Jolene Provost MD on 08/23/2009   Method used:   Printed then mailed to ...       [facility]* (mail-order)       [place]  [address]       Ph: [phone]       Fax: [phone]   [id number] CRESTOR 40 MG TABS (ROSUVASTATIN CALCIUM) Take 1 tablet by mouth once a day  #30 x 3   Entered and Authorized by:   Jolene Provost MD   Signed by:   Jolene Provost MD on 08/23/2009   Method used:   Printed then mailed to ...       [facility]* (mail-order)  [place]  [address]       Ph: [phone]       Fax: [phone]   [id number] GLIPIZIDE 10 MG TABS (GLIPIZIDE) Take 1 tablet by mouth twice daily before meals  #180 x 2   Entered and Authorized by:   Jolene Provost MD   Signed by:   Jolene Provost MD on 08/23/2009   Method used:   Printed then mailed to ...       [facility]* (mail-order)       [place]  [address]       Ph: [phone]       Fax: [phone]   RxID:   [id number] NEXIUM 20 MG CPDR (ESOMEPRAZOLE MAGNESIUM) Take 1 tablet by mouth once a day  #30 x 3   Entered and Authorized by:   Jolene Provost MD   Signed by:   Jolene Provost MD on 08/23/2009   Method used:   Printed then mailed to ...       [facility]* (mail-order)       [place]  [address]       Ph: [phone]       Fax: [phone]   [id number] LANTUS SOLOSTAR 100 UNIT/ML SOLN (INSULIN GLARGINE) inject 43 units in the evening  #1 box x 6   Entered and Authorized by:   Jolene Provost MD   Signed by:   Jolene Provost MD on 08/23/2009   Method used:   Printed then  mailed to ...       [facility]* (mail-order)       [place]  [address]       Ph: [phone]       Fax: [phone]   [id number] PEN NEEDLES 31G X 6 MM MISC (INSULIN PEN NEEDLE) Use once daily with the Lantus solostar to inject insulin  #30 x 6   Entered and Authorized by:   Jolene Provost MD   Signed by:   Jolene Provost MD on 08/23/2009   Method used:   Printed then mailed to ...       [facility]* (mail-order)       [place]  [address]       Ph: [phone]       Fax: [phone]   [id number] GLIPIZIDE 10 MG TABS (GLIPIZIDE) Take 1 tablet by mouth twice daily before meals  #180 x 2   Entered and Authorized by:   Jolene Provost MD   Signed by:   Jolene Provost MD on 08/23/2009   Method used:   Printed then mailed to ...       [facility]* (mail-order)       [place]  [address]       Ph: [phone]       Fax: [phone]   [id number] CRESTOR 40 MG TABS (ROSUVASTATIN CALCIUM) Take 1 tablet by mouth once a day  #30 x 3   Entered and Authorized by:   Jolene Provost MD   Signed by:   Jolene Provost  MD on 08/23/2009   Method used:   Printed then mailed to ...       [facility]* (mail-[place])       [place]  [address]       Ph: [phone]       Fax: [phone]   [id number] NEXIUM 20 MG CPDR (ESOMEPRAZOLE MAGNESIUM) Take 1 tablet by mouth once a day  #30 x 3   Entered and Authorized by:   Jolene Provost MD   Signed by:   Jolene Provost MD on 08/23/2009   Method used:   Printed then mailed to ...       [facility]* ([place]  [address]       Ph: [phone]       Fax: [phone]   RxID:   [id number] [name] 50 MG TABS (SITAGLIPTIN PHOSPHATE) Take 1 tablet by mouth once a day   #31 x 6   Entered and Authorized by:   Jolene Provost MD   Signed by:   Jolene Provost MD on 08/23/2009   Method used:   Printed then mailed to ...       [facility]* (mail-[place]  [address]       Ph: [phone]       Fax: [phone]   [id number] VOLTAREN 1 % GEL (DICLOFENAC SODIUM) Please apply to area, 1-4 times daily as needed for pain.  #1bottle/tube x 0   Entered and Authorized by:   Jolene Provost MD   Signed by:   Jolene Provost MD on 08/23/2009   Method used:   Printed then mailed to ...       [facility]* (mail-order)       [place]  [address]       Ph: [phone]       Fax: [phone]   [id number]  Above Rx's faxed to [facility].   Morrison Old RN  August 23, 2009 3:14 PM  Prevention & Chronic Care Immunizations   Influenza vaccine: Fluvax MCR  (10/03/2008)   Influenza vaccine due: 09/13/2009    Tetanus booster: Not documented    Pneumococcal vaccine: Pneumovax  (10/26/2006)    H. zoster vaccine: Not documented  Colorectal Screening   Hemoccult: Negative  (09/07/2003)    Colonoscopy: 6 polyps removed. 1 polyp in descending colon:  6 mm, sessile several polyps cecum to ascending colon:  2-35mm ?polyp in appendix:  biopsy pending If it is an appendiceal polyp, may need appendectomy, surgical removal Results:  Diverticulosis.         (11/06/2006)   Colonoscopy action/deferral: Repeat colonoscopy in 3 years.     (11/06/2006)   Colonoscopy due: 11/05/2009  Other Screening   Pap smear: Not documented   Pap smear action/deferral: Not indicated S/P hysterectomy  (08/23/2009)    Mammogram: ASSESSMENT: Negative - BI-RADS 1^MM DIGITAL SCREENING  (09/14/2008)   Mammogram action/deferral: Screening mammogram in 1 year.     (09/02/2007)   Mammogram due: 09/01/2009    DXA bone density scan: Not documented   DXA bone  density action/deferral: Ordered  (10/03/2008)   Smoking status:  never  (08/23/2009)  Diabetes Mellitus   HgbA1C: 7.8  (06/13/2009)    Eye exam: Not documented    Foot exam: yes  (06/13/2009)   High risk foot: No  (08/18/2007)   Foot care education: Done  (08/18/2007)   Foot exam due: 06/14/2009    Urine microalbumin/creatinine ratio: 81.1  (06/13/2009)   Urine microalbumin/cr due: 03/31/2009    Diabetes flowsheet reviewed?: Yes   Progress toward A1C goal: Unchanged  Lipids   Total Cholesterol: 158  (02/28/2009)   Lipid panel action/deferral: Lipid Panel ordered   LDL: 68  (02/28/2009)   LDL Direct: Not documented   HDL: 48  (02/28/2009)   Triglycerides: 210  (02/28/2009)    SGOT (AST): 18  (02/28/2009)   BMP action: Ordered   SGPT (ALT): 14  (02/28/2009)   Alkaline phosphatase: 71  (02/28/2009)   Total bilirubin: 0.4  (02/28/2009)    Lipid flowsheet reviewed?: Yes   Progress toward LDL goal: At goal  Hypertension   Last Blood Pressure: 125 / 62  (08/23/2009)   Serum creatinine: 1.46  (03/21/2009)   BMP action: Ordered   Serum potassium 3.9  (03/21/2009)    Hypertension flowsheet reviewed?: Yes   Progress toward BP goal: At goal  Self-Management Support :   Personal Goals (by the next clinic visit) :     Personal A1C goal: 6  (08/23/2009)     Personal blood pressure goal: 130/80  (08/23/2009)     Personal LDL goal: 70  (08/23/2009)    Patient will work on the following items until the next clinic visit to reach self-care goals:     Medications and monitoring: take my medicines every day, bring all of my medications to every visit  (08/23/2009)     Eating: eat more vegetables, use fresh or frozen vegetables, eat foods that are low in salt, eat baked foods instead of fried foods, eat fruit for snacks and desserts  (08/23/2009)     Activity: take a 30 minute walk every day  (08/23/2009)    Diabetes self-management support: Copy of home glucose meter record, CBG  self-monitoring log, Written self-care plan, Resources for patients handout  (08/23/2009)   Diabetes care plan printed    Hypertension self-management support: Written self-care plan, Resources for patients handout  (08/23/2009)   Hypertension self-care plan printed.    Lipid self-management support: Written self-care plan, Resources for patients handout  (08/23/2009)   Lipid self-care plan printed.      Resource handout printed.

## 2010-02-12 NOTE — Progress Notes (Signed)
Summary: refill/gg  Phone Note Refill Request  on April 23, 2009 4:59 PM  Refills Requested: Medication #1:  KLOR-CON M10 10 MEQ  TBCR Take 1 tablet by mouth once a day  Method Requested: Mail to Pharmacy Initial call taken by: Gevena Cotton RN,  April 23, 2009 5:00 PM  Follow-up for Phone Call        Rx called to pharmacy Follow-up by: Jolene Provost MD,  April 23, 2009 8:26 PM              Prescriptions: KLOR-CON M10 10 MEQ  TBCR (POTASSIUM CHLORIDE CRYS CR) Take 1 tablet by mouth once a day  #30 x 3   Entered and Authorized by:   Jolene Provost MD   Signed by:   Jolene Provost MD on 04/23/2009   Method used:   Electronically to        [facility] (mail-order)       [address] [place]  [address]       Ph: [phone]       Fax: [phone]   [id number]

## 2010-02-12 NOTE — Assessment & Plan Note (Signed)
Summary: 2WK F/U/SIDHU/cfb   Vital Signs:  Patient profile:   70 year old female Height:      61 inches (154.94 cm) Weight:      143.1 pounds (65.05 kg) BMI:     27.14 Temp:     96.7 degrees F (35.94 degrees C) oral Pulse rate:   71 / minute BP sitting:   123 / 70  (right arm)  Vitals Entered By: Morrison Old RN (March 21, 2009 2:04 PM) CC: F/u visit to check blood sugars. "Knot"  on bottm of left foot.   Wants Rx for diab. shoes. Is Patient Diabetic? Yes Did you bring your meter with you today? Yes Pain Assessment Patient in pain? no      Nutritional Status BMI of 25 - 29 = overweight CBG Result 205  Have you ever been in a relationship where you felt threatened, hurt or afraid?No   Does patient need assistance? Functional Status Self care Ambulation Normal   Primary Care Provider:  Katha Cabal MD  CC:  F/u visit to check blood sugars. "Knot"  on bottm of left foot.   Wants Rx for diab. shoes..  History of Present Illness: [name] is a 70 yo F with PMH of DM and HTN who presents for follow-up. She was last seen in clinic 3 wks ago, at which time her Lantus was increased to 43 units at bedtime. She has been checking her CBGs in the AM, which is pretty well-controlled according to her meter log. She had one hypoglycemic episode to 52 and was symptomatic with shakiness, but the episode resolved when she drank orange juice. She also says she has been trying to walk more and feels like her blood sugar is under better control when she does. She reports a "knot" on the bottom of her L foot that her friend felt when he was massaging her feet, but she denies any discomfort or feeling anything odd in the area.  Depression History:      The patient denies a depressed mood most of the day and a diminished interest in her usual daily activities.         Preventive Screening-Counseling & Management  Alcohol-Tobacco     Alcohol drinks/day: 0     Smoking Status:  never  Caffeine-Diet-Exercise     Does Patient Exercise: yes     Type of exercise: walking     Times/week: 2  Current Medications (verified): 1)  Aspirin 325 Mg  Tabs (Aspirin) .... Take 1 Tablet By Mouth Once A Day 2)  Nexium 20 Mg Cpdr (Esomeprazole Magnesium) .... Take 1 Tablet By Mouth Once A Day 3)  Calcium 600/vitamin D 600-200 Mg-Unit  Tabs (Calcium Carbonate-Vitamin D) .... Take 1 Tablet By Mouth Two Times A Day 4)  Furosemide 40 Mg  Tabs (Furosemide) .... Take 1 Tablet By Mouth Once A Day 5)  Klor-Con M10 10 Meq  Tbcr (Potassium Chloride Crys Cr) .... Take 1 Tablet By Mouth Once A Day 6)  Glipizide 10 Mg Tabs (Glipizide) .... Take 1 Tablet By Mouth Twice Daily Before Meals 7)  Crestor 40 Mg Tabs (Rosuvastatin Calcium) .... Take 1 Tablet By Mouth Once A Day 8)  Nitroquick 0.4 Mg Subl (Nitroglycerin) .... Take 1 Tablet Sublingually For Chest Pain 9)  Tylenol Arthritis Pain 650 Mg Tbcr (Acetaminophen) .... Take 1 Tablet By Mouth Every 4 Hours As Needed For Pain 10)  Ascensia Contour Test  Strp (Glucose Blood) .... Test Blood Sugar  2x/day-Before and 1-2 Hours After One Meal Daily 11)  Bd Ultra-Fine 33 Lancets  Misc (Lancets) .... Test Cbg Two Times A Day 12)  [name] 50 Mg Tabs (Sitagliptin Phosphate) .... Take 1 Tablet By Mouth Once A Day 13)  Diovan 320 Mg Tabs (Valsartan) .... Take 1 Tablet By Mouth Once A Day 14)  Lantus 100 Unit/ml Soln (Insulin Glargine) .... Inject 43 Units Subcutaneously At Bedtime 15)  Dovonex 0.005 %  Soln (Calcipotriene) .... Apply Topically Two Times A Day To Affected Areas 16)  Bd Insulin Syringe Ultrafine 31g X 5/16" 0.5 Ml  Misc (Insulin Syringe-Needle U-100) .... To Inject Lantus Once Nightly 17)  Hydrocortisone 1 % Crea (Hydrocortisone) .... Apply To Affected Areas Two Times A Day.  Allergies (verified): No Known Drug Allergies  Past History:  Past Medical History: Last updated: 07/27/2008 Coronary artery disease, s/p 4 vessel CABG 12/13/04  [name] followed by Dr. Doylene Canard Pleural effusion post op s/p drainage 1.2 L Osteoarthritis Myocardial infarction, hx of Hypertension DM2 HLD CRI Constipation PAD MRI: lumbar spine 6/09:  Small right lateral recess disc protrusion at L1-2 contacts the descending right L2 root.  Mild to moderate central canal narrowing at L4-5 with narrowing of both lateral recesses left worse than right facet deg. disease could impact either L5 root. psoriasis  Past Surgical History: Last updated: 10/26/2006 Coronary artery bypass graft, 4 vessel 12/13/04 PTCA/stent 09/15/01 Hysterectomy Cataract extraction: left eye:  approx. 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  other kids are all in [place].  Risk Factors: Alcohol Use: 0 (03/21/2009) Exercise: yes (03/21/2009)  Risk Factors: Smoking Status: never (03/21/2009)  Review of Systems      See HPI  Physical Exam  General:  Well-developed,well-nourished,in no acute distress; alert,appropriate and cooperative throughout examination Head:  Normocephalic and atraumatic without obvious abnormalities. No apparent alopecia or balding. Msk:  "knot" on bottom of L foot appears to be just a small adipose collection in fat pad. No tenderness. Neurologic:  alert & oriented X3.   Psych:  Cognition and judgment appear intact. Alert and cooperative with normal attention span and concentration. No apparent delusions, illusions, hallucinations   Impression & Recommendations:  Problem # 1:  DIABETES MELLITUS, TYPE II (ICD-250.00) Review of her meter log shows good control of her AM CBG, though she only had one PM CBG reading. I kept her Lantus at 43 units and encouraged her strongly to check her blood sugars in the evening time, if not every day then a few times a week. She agreed.  Her updated  medication list for this problem includes:    Aspirin 325 Mg Tabs (Aspirin) .Marland Kitchen... Take 1 tablet by mouth once a day    Glipizide 10 Mg Tabs (Glipizide) .Marland Kitchen... Take 1 tablet by mouth twice daily before meals    [name] 50 Mg Tabs (Sitagliptin phosphate) .Marland Kitchen... Take 1 tablet by mouth once a day    Diovan 320 Mg Tabs (Valsartan) .Marland Kitchen... Take 1 tablet by mouth once a day    Lantus 100 Unit/ml Soln (Insulin glargine) ..... Inject 43 units subcutaneously at bedtime  Orders: Capillary Blood Glucose/CBG (Q000111Q) T-Basic Metabolic Panel (99991111)  Problem # 2:  HYPERTENSION (ICD-401.9) Well-controlled. Did not change med regimen.  Her updated medication list for this problem includes:    Furosemide 40 Mg Tabs (Furosemide) .Marland Kitchen... Take 1 tablet by  mouth once a day    Diovan 320 Mg Tabs (Valsartan) .Marland Kitchen... Take 1 tablet by mouth once a day  Complete Medication List: 1)  Aspirin 325 Mg Tabs (Aspirin) .... Take 1 tablet by mouth once a day 2)  Nexium 20 Mg Cpdr (Esomeprazole magnesium) .... Take 1 tablet by mouth once a day 3)  Calcium 600/vitamin D 600-200 Mg-unit Tabs (Calcium carbonate-vitamin d) .... Take 1 tablet by mouth two times a day 4)  Furosemide 40 Mg Tabs (Furosemide) .... Take 1 tablet by mouth once a day 5)  Klor-con M10 10 Meq Tbcr (Potassium chloride crys cr) .... Take 1 tablet by mouth once a day 6)  Glipizide 10 Mg Tabs (Glipizide) .... Take 1 tablet by mouth twice daily before meals 7)  Crestor 40 Mg Tabs (Rosuvastatin calcium) .... Take 1 tablet by mouth once a day 8)  Nitroquick 0.4 Mg Subl (Nitroglycerin) .... Take 1 tablet sublingually for chest pain 9)  Tylenol Arthritis Pain 650 Mg Tbcr (Acetaminophen) .... Take 1 tablet by mouth every 4 hours as needed for pain 10)  Ascensia Contour Test Strp (Glucose blood) .... Test blood sugar 2x/day-before and 1-2 hours after one meal daily 11)  Bd Ultra-fine 33 Lancets Misc (Lancets) .... Test cbg two times a day 12)  [name] 50 Mg Tabs  (Sitagliptin phosphate) .... Take 1 tablet by mouth once a day 13)  Diovan 320 Mg Tabs (Valsartan) .... Take 1 tablet by mouth once a day 14)  Lantus 100 Unit/ml Soln (Insulin glargine) .... Inject 43 units subcutaneously at bedtime 15)  Dovonex 0.005 % Soln (Calcipotriene) .... Apply topically two times a day to affected areas 16)  Bd Insulin Syringe Ultrafine 31g X 5/16" 0.5 Ml Misc (Insulin syringe-needle u-100) .... To inject lantus once nightly 17)  Hydrocortisone 1 % Crea (Hydrocortisone) .... Apply to affected areas two times a day.  Patient Instructions: 1)  Please schedule a follow-up appointment in 2-3 months. 2)  Please try to check your blood sugar twice daily, as it is very important for [place] to know how your blood sugars are doing in the evening time.   Prevention & Chronic Care Immunizations   Influenza vaccine: Fluvax MCR  (10/03/2008)   Influenza vaccine due: 09/13/2009    Tetanus booster: Not documented    Pneumococcal vaccine: Pneumovax  (10/26/2006)    H. zoster vaccine: Not documented  Colorectal Screening   Hemoccult: Negative  (09/07/2003)    Colonoscopy: 6 polyps removed. 1 polyp in descending colon:  6 mm, sessile several polyps cecum to ascending colon:  2-47mm ?polyp in appendix:  biopsy pending If it is an appendiceal polyp, may need appendectomy, surgical removal Results:  Diverticulosis.         (11/06/2006)   Colonoscopy action/deferral: Repeat colonoscopy in 3 years.     (11/06/2006)   Colonoscopy due: 11/05/2009  Other Screening   Pap smear: Not documented   Pap smear action/deferral: Deferred  (10/03/2008)    Mammogram: ASSESSMENT: Negative - BI-RADS 1^MM DIGITAL SCREENING  (09/14/2008)   Mammogram action/deferral: Screening mammogram in 1 year.     (09/02/2007)   Mammogram due: 09/01/2009    DXA bone density scan: Not documented   DXA bone density action/deferral: Ordered  (10/03/2008)   Smoking status: never  (03/21/2009)  Diabetes  Mellitus   HgbA1C: 7.9  (01/29/2009)    Eye exam: Not documented    Foot exam: yes  (01/29/2009)   High risk foot: No  (08/18/2007)  Foot care education: Done  (08/18/2007)   Foot exam due: 06/14/2009    Urine microalbumin/creatinine ratio: 50.6  (03/31/2008)   Urine microalbumin/cr due: 03/31/2009  Lipids   Total Cholesterol: 158  (02/28/2009)   Lipid panel action/deferral: Lipid Panel ordered   LDL: 68  (02/28/2009)   LDL Direct: Not documented   HDL: 48  (02/28/2009)   Triglycerides: 210  (02/28/2009)    SGOT (AST): 18  (02/28/2009)   BMP action: Ordered   SGPT (ALT): 14  (02/28/2009)   Alkaline phosphatase: 71  (02/28/2009)   Total bilirubin: 0.4  (02/28/2009)  Hypertension   Last Blood Pressure: 123 / 70  (03/21/2009)   Serum creatinine: 1.60  (02/28/2009)   BMP action: Ordered   Serum potassium 4.4  (02/28/2009)  Self-Management Support :   Personal Goals (by the next clinic visit) :     Personal A1C goal: 6  (02/28/2009)     Personal blood pressure goal: 130/80  (02/28/2009)     Personal LDL goal: 70  (02/28/2009)    Patient will work on the following items until the next clinic visit to reach self-care goals:     Medications and monitoring: check my blood sugar, check my blood pressure  (03/21/2009)     Eating: drink diet soda or water instead of juice or soda, eat foods that are low in salt, eat baked foods instead of fried foods  (02/28/2009)     Activity: take a 30 minute walk every day  (03/21/2009)    Diabetes self-management support: Copy of home glucose meter record, Written self-care plan  (03/21/2009)   Diabetes care plan printed    Hypertension self-management support: Written self-care plan  (03/21/2009)   Hypertension self-care plan printed.    Lipid self-management support: Written self-care plan  (03/21/2009)   Lipid self-care plan printed.  Process Orders Check Orders Results:     [facility]: Check successful Tests Sent  for requisitioning (March 22, 2009 8:44 AM):     03/21/2009: [facility] -- T-Basic Metabolic Panel 0000000 (signed)

## 2010-02-14 NOTE — Progress Notes (Signed)
Summary: phone/gg  Phone Note From Other Clinic   Summary of Call: Received a call from ginger who does Dexa scans and states pt is in office for scan but she just had one on 11/24/08.  She feels it's to early as pt not getting any active treatment.  Talked with Dr Stann Mainland and he reviewed chart and said to cancel Scan. also paged dr Ina Homes and she agrees. Scan had been d/c Initial call taken by: Gevena Cotton RN,  January 24, 2010 10:30 AM  Follow-up for Phone Call        Reviewed this question on UpToDate:  High risk post-menopausal women with risk factors should be scanned every 2 years; others every 3-5 years.  Even this is controversial with no RCT evidence that screening prevents fractures. Follow-up by: Milta Deiters MD,  January 24, 2010 10:52 AM

## 2010-03-25 LAB — GLUCOSE, CAPILLARY: Glucose-Capillary: 285 mg/dL — ABNORMAL HIGH (ref 70–99)

## 2010-03-28 LAB — GLUCOSE, CAPILLARY: Glucose-Capillary: 137 mg/dL — ABNORMAL HIGH (ref 70–99)

## 2010-03-29 LAB — GLUCOSE, CAPILLARY: Glucose-Capillary: 204 mg/dL — ABNORMAL HIGH (ref 70–99)

## 2010-03-31 LAB — GLUCOSE, CAPILLARY: Glucose-Capillary: 117 mg/dL — ABNORMAL HIGH (ref 70–99)

## 2010-04-01 LAB — GLUCOSE, CAPILLARY: Glucose-Capillary: 216 mg/dL — ABNORMAL HIGH (ref 70–99)

## 2010-04-18 ENCOUNTER — Encounter: Payer: Self-pay | Admitting: Internal Medicine

## 2010-04-18 ENCOUNTER — Ambulatory Visit (INDEPENDENT_AMBULATORY_CARE_PROVIDER_SITE_OTHER): Payer: PRIVATE HEALTH INSURANCE | Admitting: Internal Medicine

## 2010-04-18 DIAGNOSIS — M25559 Pain in unspecified hip: Secondary | ICD-10-CM

## 2010-04-18 DIAGNOSIS — N259 Disorder resulting from impaired renal tubular function, unspecified: Secondary | ICD-10-CM

## 2010-04-18 DIAGNOSIS — E1142 Type 2 diabetes mellitus with diabetic polyneuropathy: Secondary | ICD-10-CM

## 2010-04-18 DIAGNOSIS — E785 Hyperlipidemia, unspecified: Secondary | ICD-10-CM

## 2010-04-18 DIAGNOSIS — I10 Essential (primary) hypertension: Secondary | ICD-10-CM

## 2010-04-18 DIAGNOSIS — I739 Peripheral vascular disease, unspecified: Secondary | ICD-10-CM

## 2010-04-18 DIAGNOSIS — E1340 Other specified diabetes mellitus with diabetic neuropathy, unspecified: Secondary | ICD-10-CM

## 2010-04-18 DIAGNOSIS — I5022 Chronic systolic (congestive) heart failure: Secondary | ICD-10-CM

## 2010-04-18 DIAGNOSIS — I2510 Atherosclerotic heart disease of native coronary artery without angina pectoris: Secondary | ICD-10-CM

## 2010-04-18 DIAGNOSIS — E1349 Other specified diabetes mellitus with other diabetic neurological complication: Secondary | ICD-10-CM

## 2010-04-18 DIAGNOSIS — E119 Type 2 diabetes mellitus without complications: Secondary | ICD-10-CM

## 2010-04-18 LAB — GLUCOSE, CAPILLARY: Glucose-Capillary: 203 mg/dL — ABNORMAL HIGH (ref 70–99)

## 2010-04-18 LAB — POCT GLYCOSYLATED HEMOGLOBIN (HGB A1C): Hemoglobin A1C: 8.6

## 2010-04-18 MED ORDER — TRAMADOL HCL 50 MG PO TABS: 50.0000 mg | ORAL_TABLET | Freq: Four times a day (QID) | ORAL | Status: DC | PRN

## 2010-04-18 MED ORDER — GLIPIZIDE 10 MG PO TABS: 10.0000 mg | ORAL_TABLET | Freq: Two times a day (BID) | ORAL | Status: DC

## 2010-04-18 MED ORDER — INSULIN REGULAR HUMAN 100 UNIT/ML IJ SOLN: 3.0000 [IU] | Freq: Three times a day (TID) | INTRAMUSCULAR | Status: DC

## 2010-04-18 MED ORDER — ESOMEPRAZOLE MAGNESIUM 40 MG PO CPDR: 40.0000 mg | DELAYED_RELEASE_CAPSULE | Freq: Every day | ORAL | Status: DC

## 2010-04-18 MED ORDER — FUROSEMIDE 40 MG PO TABS: 40.0000 mg | ORAL_TABLET | Freq: Every day | ORAL | Status: DC

## 2010-04-18 MED ORDER — PENTOXIFYLLINE 400 MG PO TBCR: 400.0000 mg | EXTENDED_RELEASE_TABLET | Freq: Three times a day (TID) | ORAL | Status: DC

## 2010-04-18 MED ORDER — VALSARTAN 320 MG PO TABS: 320.0000 mg | ORAL_TABLET | Freq: Every day | ORAL | Status: DC

## 2010-04-18 MED ORDER — SITAGLIPTIN PHOSPHATE 50 MG PO TABS: 50.0000 mg | ORAL_TABLET | Freq: Every day | ORAL | Status: DC

## 2010-04-18 MED ORDER — GABAPENTIN 300 MG PO CAPS: 300.0000 mg | ORAL_CAPSULE | Freq: Every day | ORAL | Status: DC

## 2010-04-18 MED ORDER — ROSUVASTATIN CALCIUM 40 MG PO TABS: 40.0000 mg | ORAL_TABLET | Freq: Every day | ORAL | Status: DC

## 2010-04-18 MED ORDER — METOPROLOL SUCCINATE ER 25 MG PO TB24: 25.0000 mg | ORAL_TABLET | Freq: Every day | ORAL | Status: DC

## 2010-04-18 NOTE — Patient Instructions (Signed)
You have been started on Sliding Scale Insulin Meal coverage. You will check your blood sugars before every meal, and administer 3-5 units depending on what you eat.  You will continue taking Lantus 43 units everyday. Please follow up with Dr. Doylene Canard as scheduled, Monday April 9th, 2012 at 3 pm. Please follow up with Dr. Scot Dock as scheduled. You have also been re-started on Trental, please take this medication twice a day. You have been started on Gabapentin 300 mg which you will take at bedtime.  Please follow-up with Dr. Ina Homes in 3 months.  Please follow up with Lab and Barnabas Harries on Tuesday April 10th, 2012. Please make sure you are fasting, as you will have bloodwork done.

## 2010-04-18 NOTE — Progress Notes (Signed)
Pt.'s rxs. faxed to [facility].

## 2010-04-21 LAB — GLUCOSE, CAPILLARY: Glucose-Capillary: 115 mg/dL — ABNORMAL HIGH (ref 70–99)

## 2010-04-22 ENCOUNTER — Encounter: Payer: Self-pay | Admitting: Internal Medicine

## 2010-04-22 ENCOUNTER — Observation Stay (HOSPITAL_COMMUNITY)
Admission: AD | Admit: 2010-04-22 | Discharge: 2010-04-25 | Disposition: A | Discharge status: Home / Self Care | Payer: PRIVATE HEALTH INSURANCE | Source: Ambulatory Visit | Attending: Cardiovascular Disease | Admitting: Cardiovascular Disease

## 2010-04-22 DIAGNOSIS — Z7982 Long term (current) use of aspirin: Secondary | ICD-10-CM | POA: Insufficient documentation

## 2010-04-22 DIAGNOSIS — R0989 Other specified symptoms and signs involving the circulatory and respiratory systems: Secondary | ICD-10-CM | POA: Insufficient documentation

## 2010-04-22 DIAGNOSIS — E1340 Other specified diabetes mellitus with diabetic neuropathy, unspecified: Secondary | ICD-10-CM | POA: Insufficient documentation

## 2010-04-22 DIAGNOSIS — I5030 Unspecified diastolic (congestive) heart failure: Secondary | ICD-10-CM | POA: Insufficient documentation

## 2010-04-22 DIAGNOSIS — R55 Syncope and collapse: Principal | ICD-10-CM | POA: Insufficient documentation

## 2010-04-22 DIAGNOSIS — Z79899 Other long term (current) drug therapy: Secondary | ICD-10-CM | POA: Insufficient documentation

## 2010-04-22 DIAGNOSIS — Z951 Presence of aortocoronary bypass graft: Secondary | ICD-10-CM | POA: Insufficient documentation

## 2010-04-22 DIAGNOSIS — I10 Essential (primary) hypertension: Secondary | ICD-10-CM | POA: Insufficient documentation

## 2010-04-22 DIAGNOSIS — R0609 Other forms of dyspnea: Secondary | ICD-10-CM | POA: Insufficient documentation

## 2010-04-22 DIAGNOSIS — E119 Type 2 diabetes mellitus without complications: Secondary | ICD-10-CM | POA: Insufficient documentation

## 2010-04-22 DIAGNOSIS — I2510 Atherosclerotic heart disease of native coronary artery without angina pectoris: Secondary | ICD-10-CM | POA: Insufficient documentation

## 2010-04-22 DIAGNOSIS — E669 Obesity, unspecified: Secondary | ICD-10-CM | POA: Insufficient documentation

## 2010-04-22 LAB — COMPREHENSIVE METABOLIC PANEL
Alkaline Phosphatase: 68 U/L (ref 39–117)
BUN: 33 mg/dL — ABNORMAL HIGH (ref 6–23)
CO2: 33 mEq/L — ABNORMAL HIGH (ref 19–32)
Chloride: 99 mEq/L (ref 96–112)
Creatinine, Ser: 1.9 mg/dL — ABNORMAL HIGH (ref 0.4–1.2)
GFR calc non Af Amer: 26 mL/min — ABNORMAL LOW (ref >60)
Potassium: 3.7 mEq/L (ref 3.5–5.1)
Total Bilirubin: 0.5 mg/dL (ref 0.3–1.2)

## 2010-04-22 LAB — CARDIAC PANEL(CRET KIN+CKTOT+MB+TROPI): Troponin I: 0.02 ng/mL (ref 0.00–0.06)

## 2010-04-22 LAB — DIFFERENTIAL
Lymphs Abs: 1.6 10*3/uL (ref 0.7–4.0)
Monocytes Absolute: 0.6 10*3/uL (ref 0.1–1.0)
Monocytes Relative: 8 % (ref 3–12)
Neutro Abs: 4.7 10*3/uL (ref 1.7–7.7)
Neutrophils Relative %: 64 % (ref 43–77)

## 2010-04-22 LAB — CBC
HCT: 39 % (ref 36.0–46.0)
Hemoglobin: 13.1 g/dL (ref 12.0–15.0)
MCH: 29.7 pg (ref 26.0–34.0)
MCV: 88.4 fL (ref 78.0–100.0)
RBC: 4.41 MIL/uL (ref 3.87–5.11)

## 2010-04-22 LAB — GLUCOSE, CAPILLARY
Glucose-Capillary: 115 mg/dL — ABNORMAL HIGH (ref 70–99)
Glucose-Capillary: 82 mg/dL (ref 70–99)
Glucose-Capillary: 241 mg/dL — ABNORMAL HIGH (ref 70–99)

## 2010-04-22 LAB — HEMOGLOBIN A1C
Hgb A1c MFr Bld: 8.9 % — ABNORMAL HIGH (ref <5.7)
Mean Plasma Glucose: 209 mg/dL — ABNORMAL HIGH (ref <117)

## 2010-04-22 LAB — PROTIME-INR: Prothrombin Time: 13.3 seconds (ref 11.6–15.2)

## 2010-04-22 NOTE — Assessment & Plan Note (Signed)
Lab Results  Component Value Date   HGBA1C 8.6 04/18/2010   HGBA1C 8.1 12/20/2009   CREATININE 1.74* 12/31/2009   MICROALBUR 1.98* 06/13/2009   MICRALBCREAT 81.1* 06/13/2009   CHOL 158 02/28/2009   HDL 48 02/28/2009   TRIG 210* 02/28/2009   Assessment: Diabetes control: not controlled Progress toward goals: deteriorated Barriers to meeting goals: previously refused sliding scale meal coverage . AM blood sugars are relatively well controlled indicating that basal insulin (Lantus) working well for patient. However, sugars after meals, or sometime during the day are elevated, and lately greater than 300.   Plan: Diabetes treatment: continue current medications Along with Lantus, will also start patient on SSI (3-5 units) before every meal. Continue januvia per pt preference Refer to: diabetes educator for self-management training and diabetes educator for medical nutrition therapy Instruction/counseling given: reminded to bring blood glucose meter & log to each visit, reminded to bring medications to each visit, discussed diet and other instruction/counseling: Instructed on what sliding scale insulin meal coverage was, taught pt how to administer insulin according to type of meals she eats, and have referred to Barnabas Harries for further education regarding sliding scale coverage

## 2010-04-22 NOTE — Assessment & Plan Note (Signed)
Last Echo 06/2008 reveals EF 30-35%., however I cannot find record of this 2D Echo. I presume it was done at Dr. Merrilee Jansky office and that it was faxed over and just not scanned into our system. Previous Echo was from 2009 which showed preserved systolic function, and severe Tricuspid regurg only. Pt without dyspnea, edema, PND, chest pain today. Physical exam unremarkable.   Plan: -Continue current regimen of ARB - Valsartan (did not tolerate Lisinopril due to cough), beta blocker (metoprolol) and lasix. -request office records from Dr. Merrilee Jansky office to review last Echo -have pt follow up with Dr. Doylene Canard

## 2010-04-22 NOTE — Assessment & Plan Note (Signed)
Lab Results  Component Value Date   NA 140 12/31/2009   K 4.2 12/31/2009   CL 98 12/31/2009   CO2 34* 12/31/2009   BUN 27* 12/31/2009   CREATININE 1.74* 12/31/2009    BP Readings from Last 3 Encounters:  04/18/10 106/55  12/20/09 131/70  09/27/09 140/78    Assessment: Hypertension control:  controlled  Progress toward goals:  at goal Barriers to meeting goals:  no barriers identified  Plan: Hypertension treatment:  continue current medications (Lasix 40 mg qday and Metoprolol XL 25 mg qday) Check CMET 04/10 for renal function and electrolytes as pt is on Lasix therapy

## 2010-04-22 NOTE — Assessment & Plan Note (Signed)
Will start Gabapentin, and continue to improve diabetic control, with adding SSI. Please see DM for further details on management.

## 2010-04-22 NOTE — Assessment & Plan Note (Signed)
Significant for ABI indicated moderated reduction arterial flow on the left. Duplex scan: reveal severe diffuse plaque noted in the mid SFA with a greater than 50% stenosis. Followed by Dr. Scot Dock, whom she has not seen since 2009 when dopplers were performed. At that time was encouraged to continue walking and control risk factors such as HLD and DM. Pt with c/o claudication, although without rest pain. In past was on medication called Trental which is more of a symptomatic medication rather than slowing the progression of PVD. Of note, pt cannot be on Pletal due to heart failure. Will restart Trental, and have patient follow up with Dr. Scot Dock. Continue to manage other risk factors. Please see separate risk factors for management.

## 2010-04-22 NOTE — Assessment & Plan Note (Signed)
Subjective:    [name] is here for follow up of elevated cholesterol. Compliance with treatment has been excellent. The patient exercises intermittently. Patient denies muscle pain associated with her medications.   Objective:   Lab Review Lab Results  Component Value Date   CHOL 158 02/28/2009   CHOL 152 06/14/2008   CHOL 196 03/31/2008   HDL 48 02/28/2009   HDL 41 06/14/2008   HDL 43 03/31/2008      Assessment:    Dyslipidemia under excellent control.    Plan:    1. Continue dietary measures. 2. Continue regular exercise. 3. Lipid-lowering medications: Crestor 4. Follow up 04/23/2010 for FLP as patient was not fasting today. Pt will also get CMET this day.

## 2010-04-22 NOTE — Progress Notes (Signed)
Subjective:    Patient ID: [name], female    DOB: 09-Feb-1940, 70 y.o.   MRN: [id number]  HPI  [name] is a 70 yo F with PMH of CAD s/p CABG 2006, with EF 30-35% (06/2008) followed by Dr. Doylene Canard, DM and HTN who presents for follow-up. She was last seen in clinic Dec. 2011 for DM follow up.  1) DM - Patient has brought her blood glucose meter log and her am sugars have been running from 90-400 range. Pt denies any episodes of hypoglycemia. All AM sugars are usually in 90-150 range, with mid-day and dinner CBGs running much higher. She is on oral diabetes medications along with Lantus. She notes that her blood sugars have been running higher than usual and attributes this to eating popcorn.   2) She states she has stopped exercising at the [facility] due to colder weather, and symptoms of back pain radiating to the buttocks. She has lumbar stenosis and states it might be due to this, but also has PVD. She denies rest pain, and states it acts up after walking for a little while.  No chest pain, or shortness of breath with exertion.   She denies CP, cough, SOB, orthopnea, PND, abdominal pain, N/V, changes in bowel habits, changes in urinary habits, and no recent hypoglycemic episodes.   Review of Systems  [all other systems reviewed and are negative       Objective:   Physical Exam  Constitutional: She is oriented to person, place, and time. She appears well-developed and well-nourished.  HENT:  Head: Normocephalic.  Eyes: Pupils are equal, round, and reactive to light.  Neck: Normal range of motion. Neck supple.  Cardiovascular: Normal rate, regular rhythm and normal heart sounds.   Pulmonary/Chest: Effort normal and breath sounds normal. She has no rales.  Abdominal: Soft. Bowel sounds are normal.  Neurological: She is alert and oriented to person, place, and time.  Psychiatric: She has a normal mood and affect.        Past Medical History  Diagnosis Date  . DM (diabetes  mellitus)     insulin dependent  . Lumbar spinal stenosis   . History of colonic polyps   . Diverticulosis   . Nephrolithiasis     hx of  . HTN (hypertension)   . HLD (hyperlipidemia)   . PVD (peripheral vascular disease)     ABI indicated moderated reduction arterial flow on the left.  Marland Kitchen CAD (coronary artery disease) 2006    s/p Quadrupal CABG 2006  . MI (myocardial infarction) 2003    . [place]  . Tricuspid regurgitation 2009    moderate to severe, EF 55%  . Psoriasis   . OA (osteoarthritis)   . CKD (chronic kidney disease) stage 4, GFR 15-29 ml/min     fluctuating between stage 3 and 4 depending on GFR  . Osteopenia 2010    T score of -1.8  . CHF (congestive heart failure) 2010    EF 30-35% from Echo 06/2008   History   Social History  . Marital Status: Widowed    Spouse Name: N/A    Number of Children: N/A  . Years of Education: N/A   Occupational History  . Not on file.   Social History Main Topics  . Smoking status: Never Smoker   . Smokeless tobacco: Not on file  . Alcohol Use: No  . Drug Use: No  . Sexually Active: Not on file   Other Topics Concern  .  Not on file   Social History Narrative   Retired Oceanographer workerWidow/Widowerlives in [place] kids (2 sons, 2 daughters) one son died many years ago in car accident.  other kids are all in [place].Has friend that helps take care of her     Assessment & Plan:

## 2010-04-22 NOTE — Assessment & Plan Note (Signed)
CKD Stage 3. Pt has had episodes of acute on chronic renal insufficiency. CKD secondary to long-standing HTN and DM. Of note, pt is on Lasix and Valsartan which can contribute to renal dysfunction, however patient has had normal Cr and GFR levels being on these medications. Will check renal function 04/10 and adjust medications accordingly. If continues to worsen, will refer to renal for further evaluation. Baseline Cr 1.4-1.6. No NSAIDs for patient given renal dysfunction. Pt advised to take Tylenol for pain relief from arthritis.

## 2010-04-22 NOTE — Assessment & Plan Note (Signed)
Stable, unable to follow up with cards since 2010, because she could not afford copay. Pt on ASA, Beta blocker (which is dosed only once daily, due to hx of bradycardia), and anti-hypertensives. No new episodes of chest pain. Continue managing risk factors, ie hyperlipidemia, htn, which is pretty well controlled. Pt does not smoke. Have patient follow up with Dr. Doylene Canard.    Her updated medication list for this problem includes:    Aspirin 325 Mg Tabs (Aspirin) .Marland Kitchen... Take 1 tablet by mouth once a day    Furosemide 40 Mg Tabs (Furosemide) .Marland Kitchen... Take 1 tablet by mouth once a day    Nitroquick 0.4 Mg Subl (Nitroglycerin) .Marland Kitchen... Take 1 tablet sublingually for chest pain    Diovan 320 Mg Tabs (Valsartan) .Marland Kitchen... Take 1 tablet by mouth once a day    Metoprolol Tartrate 25 Mg Tabs (Metoprolol tartrate) .Marland Kitchen... Take 1 tablet by mouth once a day

## 2010-04-22 NOTE — Assessment & Plan Note (Signed)
Claudication sx (PVD) vs OA. Continue tramadol for pain relief. Avoid NSAIDs. Will re-start trental for PVD sx.

## 2010-04-23 ENCOUNTER — Other Ambulatory Visit: Payer: PRIVATE HEALTH INSURANCE

## 2010-04-23 ENCOUNTER — Ambulatory Visit: Payer: PRIVATE HEALTH INSURANCE

## 2010-04-23 LAB — CARDIAC PANEL(CRET KIN+CKTOT+MB+TROPI)
CK, MB: 1.9 ng/mL (ref 0.3–4.0)
CK, MB: 1.6 ng/mL (ref 0.3–4.0)
Relative Index: 1.8 (ref 0.0–2.5)
Relative Index: INVALID (ref 0.0–2.5)
Total CK: 104 U/L (ref 7–177)
Total CK: 83 U/L (ref 7–177)

## 2010-04-23 LAB — LIPID PANEL
LDL Cholesterol: 47 mg/dL (ref 0–99)
Triglycerides: 249 mg/dL — ABNORMAL HIGH (ref <150)
VLDL: 50 mg/dL — ABNORMAL HIGH (ref 0–40)

## 2010-04-23 LAB — GLUCOSE, CAPILLARY
Glucose-Capillary: 163 mg/dL — ABNORMAL HIGH (ref 70–99)
Glucose-Capillary: 180 mg/dL — ABNORMAL HIGH (ref 70–99)

## 2010-04-24 ENCOUNTER — Observation Stay (HOSPITAL_COMMUNITY): Payer: PRIVATE HEALTH INSURANCE

## 2010-04-24 LAB — GLUCOSE, CAPILLARY
Glucose-Capillary: 211 mg/dL — ABNORMAL HIGH (ref 70–99)
Glucose-Capillary: 165 mg/dL — ABNORMAL HIGH (ref 70–99)

## 2010-04-24 MED ORDER — TECHNETIUM TC 99M TETROFOSMIN IV KIT
30.0000 | PACK | Freq: Once | INTRAVENOUS | Status: AC | PRN
  Administered 2010-04-24: 30 via INTRAVENOUS

## 2010-04-24 MED ORDER — TECHNETIUM TC 99M TETROFOSMIN IV KIT
10.0000 | PACK | Freq: Once | INTRAVENOUS | Status: AC | PRN
  Administered 2010-04-24: 10 via INTRAVENOUS

## 2010-04-25 ENCOUNTER — Other Ambulatory Visit (HOSPITAL_COMMUNITY): Payer: PRIVATE HEALTH INSURANCE

## 2010-05-16 NOTE — Discharge Summary (Signed)
[name], [name]              ACCOUNT NO.:  [id number]  MEDICAL RECORD NO.:  [id number]           PATIENT TYPE:  O  LOCATION:  [id number]                         FACILITY:  [facility]  PHYSICIAN:  Birdie Riddle, M.D.  DATE OF BIRTH:  25-Feb-1940  DATE OF ADMISSION:  04/22/2010 DATE OF DISCHARGE:  04/25/2010                              DISCHARGE SUMMARY   FINAL DIAGNOSES: 1. Dizziness. 2. Shortness of breath. 3. Coronary artery disease. 4. Status post coronary artery bypass graft surgery. 5. Diabetes mellitus, type 2. 6. Obesity. 7. Anxiety.  DISCHARGE MEDICATIONS: 1. Meclizine 12.5 mg 1 twice daily as needed. 2. Nitroglycerin 0.4 mg sublingual tablet 1 under the tongue every 5     minutes as needed up to 3 doses as needed. 3. Aspirin 325 mg daily. 4. Calcium carbonate 1 daily. 5. Crestor 40 mg at bedtime. 6. Diovan 320 mg 1 in the morning. 7. Lasix 40 mg daily. 8. Glipizide 10 mg twice daily. 9. [name] 50 mg 1 in the morning. 10.Lantus insulin 43 units subcutaneously at bedtime. 11.Metoprolol succinate 25 mg 1 in the morning. 12.Nexium 40 mg 1 in the evening. 13.Tramadol 50 mg 1 tablet daily at bedtime as needed.  DISCHARGE DIET:  Low-sodium, heart-healthy diet and carbohydrate- modified, medium-calorie diet.  DISCHARGE ACTIVITY:  The patient is to walk with assistance.  FOLLOWUP:  By Dr. Dixie Dials in 2 weeks.  SPECIAL INSTRUCTION:  The patient is to stop any activity that causes chest pain, shortness of breath, dizziness, sweating, or excessive weakness.  HISTORY OF PRESENT ILLNESS:  This 70 year old white female complained of dizziness, more so in the morning.  No chest pain, but positive exertional dyspnea.  The patient has known history of coronary artery disease, diabetes, and hypertension.  PHYSICAL EXAMINATION:  VITAL SIGNS:  Temperature 97, pulse 53, respirations 14, blood pressure 141/69, height 5 feet and 2 inches, and weight 141 pounds.  Body mass  index of 25. GENERAL:  The patient is short and well nourished. HEENT:  The patient is normocephalic and atraumatic with blue eyes. Conjunctivae are pink.  Sclerae are nonicteric.  Pupils are equally reactive to light.  Extraocular movements are intact.  Wears glasses. Wears upper and lower dentures and left lens implant. NECK:  No JVD. LUNGS:  Clear bilaterally. HEART:  Normal.  S1 and S2 with grade 2/6 systolic murmur. ABDOMEN:  Soft and nontender. EXTREMITIES:  No edema, cyanosis, or clubbing. SKIN:  Warm and dry. NEUROLOGICAL:  The patient moves all 4 extremities.  Has positive Rohmberg sign.  LABORATORY DATA:  Normal hemoglobin, hematocrit, WBC count, and platelet count.  Normal electrolytes.  BUN elevated at 33, creatinine 1.90, glucose 175 mg/dL.  Liver enzymes normal.  Hemoglobin A1c of 8.9 with average sugar of 209.  CK-MB, troponin I negative x3.  Lipid profile showed a total cholesterol of 132, triglyceride of 249, HDL cholesterol low at 35, VLDL high at 50, and LDL cholesterol of 47.  Thyroid stimulating hormone level of 1.284.    Nuclear stress test showed no fixed or reversible perfusion defect with  an ejection fraction of 77% and wall motion  was unremarkable.    Two-D echocardiogram showed mild concentric left ventricular hypertrophy  with normal systolic function and mild mitral valve regurgitation with  calcified annulus.    EKG showed sinus bradycardia with first degree AV block and anterolateral  ischemia.  HOSPITAL COURSE:  The patient was admitted to Telemetry Unit. Myocardial infarction was ruled out.  She underwent nuclear stress test that failed to show any reversible ischemia.  She was started on Antivert with some improvement in her dizziness and her medications were adjusted and she was discharged home in satisfactory condition with followup by me in 2 weeks.     Birdie Riddle, M.D.     ASK/MEDQ  D:  05/15/2010  T:  05/15/2010  Job:   [id number]  Electronically Signed by Dixie Dials M.D. on 05/16/2010 07:48:31 AM

## 2010-05-22 ENCOUNTER — Ambulatory Visit (INDEPENDENT_AMBULATORY_CARE_PROVIDER_SITE_OTHER): Payer: PRIVATE HEALTH INSURANCE | Admitting: Vascular Surgery

## 2010-05-22 ENCOUNTER — Encounter (INDEPENDENT_AMBULATORY_CARE_PROVIDER_SITE_OTHER): Payer: PRIVATE HEALTH INSURANCE

## 2010-05-22 DIAGNOSIS — I739 Peripheral vascular disease, unspecified: Secondary | ICD-10-CM

## 2010-05-22 DIAGNOSIS — I70219 Atherosclerosis of native arteries of extremities with intermittent claudication, unspecified extremity: Secondary | ICD-10-CM

## 2010-05-24 ENCOUNTER — Encounter (INDEPENDENT_AMBULATORY_CARE_PROVIDER_SITE_OTHER): Payer: PRIVATE HEALTH INSURANCE

## 2010-05-24 DIAGNOSIS — M79609 Pain in unspecified limb: Secondary | ICD-10-CM

## 2010-05-24 NOTE — Assessment & Plan Note (Signed)
OFFICE VISIT  AGUSTIN, BARCZAK DOB:  Jun 04, 1940                                       05/22/2010 [id number]  I saw the patient in the office today in consultation concerning her right calf pain.  This is a pleasant 70 year old woman whom I had actually seen in the past with peripheral vascular disease.  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  She was then lost to followup.  She states that she developed the gradual onset of pain in her right calf several months ago.  The pain is worse when she has her leg dependent, and the pain improves with elevation of her leg.  She does not remember any specific injury to the leg.  She does not describe any symptoms consistent with claudication or rest pain.  She has had no history of nonhealing wounds.  She is unaware of any previous history of DVT or phlebitis.  PAST MEDICAL HISTORY:  Significant for diabetes, hypertension, hypercholesterolemia and coronary artery disease.  She had a myocardial infarction in 2004.  She has had congestive heart failure in the past.  SOCIAL HISTORY:  She is widowed.  She has 4 children.  She does not use tobacco.  FAMILY HISTORY:  She is unaware of any history of premature cardiovascular disease.  REVIEW OF SYSTEMS:  CARDIOVASCULAR:  She has had no chest pain, chest pressure, palpitations or arrhythmias.  She has had no history of stroke, TIAs or amaurosis fugax.  She has had no history of DVT or phlebitis. GI:  She has occasional problems with constipation. PULMONARY:  She had no productive cough, bronchitis, asthma or wheezing.  PHYSICAL EXAMINATION:  General:  This is a pleasant 70 year old woman who appears her stated age.  Vital Signs: Blood pressure is 137/71, heart rate is 52, saturation 98%, respiratory rate is 18.  HEENT: Unremarkable.  Lungs:  Clear bilaterally to  auscultation. Cardiovascular Exam:  I do not detect any carotid bruits.  She has a regular rate and rhythm.  She has palpable femoral pulses and palpable right popliteal and right dorsalis pedis and posterior tibial pulse.  I cannot palpate a popliteal pulse on the left.  She has a weakly palpable posterior tibial pulse.  She has no significant lower extremity swelling.  She has some small spider veins bilaterally.  Abdomen:  Soft and nontender with normal-pitched bowel sounds.  Musculoskeletal Exam: No major deformities or cyanosis.  Neurologic Exam:  She has no focal weakness or paresthesias.  DATA:  I did independently interpret an arterial Doppler study today which shows biphasic Doppler signals on the right but with an ABI of 100%.  On the left side, she has monophasic signals with an ABI of 77%, which is actually improved compared to her study 2 years ago.  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  Her only symptoms are right calf pain which is relieved with elevation.  Although I think it is unlikely she has a DVT, I have ordered a venous duplex scan to rule out a DVT.  Assuming this is negative, I would encourage her simply to elevate her leg as needed for the pain and will be happy to  see her back at any time if any new vascular issues arise.  Her venous duplex scan has been scheduled for 05/24/2010, and we will have that report sent to Dr. Ina Homes at the [facility].    Judeth Cornfield. Scot Dock, M.D. Electronically Signed  CSD/MEDQ  D:  05/22/2010  T:  05/23/2010  Job:  [id number]  cc:   Dr. Ina Homes

## 2010-05-28 NOTE — Assessment & Plan Note (Signed)
[facility] OFFICE NOTE   NAME:[name], [name] RUSK                     MRN:          [id number]  DATE:09/28/2006                            DOB:          05/04/1940    REASON FOR CONSULTATION:  Mucus discharge with constipation, change in  bowel habits.   ASSESSMENT:  A  70 year old white woman who has never had a colonoscopy  who had a spell of constipation for several weeks, perhaps two weeks  actually.  It was associated with a mucous discharge.  This was a new  problem.  It is now resolved.   PLAN:  Schedule colonoscopy, done best to keep this change in bowel  habits.  Further plans pending that.  Risks, benefits, and indications  are explained.  She agrees and understands.   Please see my medical history form for full details and as above.  There  are no other active GI symptoms though she takes medications for reflux  disease.  The symptoms are controlled.   PAST MEDICAL HISTORY:  1. Hypertension.  2. Coronary artery disease with previous angioplasty and stenting and      coronary bypass grafting and coronary bypass grafting and      myocardial infarction.  3. Diabetes mellitus, type 2.  4. Dyslipidemia.  5. Osteoarthritis.  6. Nephrolithiasis.  7. Obesity.   MEDICATIONS:  1. Atenolol 25 mg daily.  2. Aspirin 325 mg daily.  3. Famotidine 40 mg at bedtime.  4. Dovonex 0.005% b.i.d.  5. Calcium 600-D b.i.d.  6. Furosemide 400 mg a half every other day.  7. Potassium chloride 10 mEq daily.  8. Glipizide 10 mg b.i.d.  9. Simvastatin 40 mg daily.  10.Nitro-Quick p.r.n.  11.Cardizem 30 mg daily.  12.[name] 50 mg daily.  13.Diovan 160 mg 1-1/2 tablets (240 mg) daily.  14.Lantus 25 units at bedtime.   ALLERGIES:  Drug allergies:  None known.   FAMILY HISTORY/SOCIAL HISTORY/REVIEW OF SYSTEMS:  See medical history  form for full details.  Her blood sugars are generally under 200.   She lives  alone.  She is widowed.  Her friend is a patient of mine.  She  is retired from [employer].   PHYSICAL EXAMINATION:  Reveals a pleasant, obese, elderly white woman.  Height 5 feet, weight 154 pounds.  Blood pressure 110/58, pulse 64.  HEENT:  Eyes anicteric.  ENT:  Dentures.  Otherwise free of lesions.  NECK:  Supple.  No thyromegaly.  CHEST:  Clear.  HEART:  S1/S2.  ABDOMEN:  Soft, obese, nontender.  No organomegaly.  RECTAL:  Exam deferred.  EXTREMITIES:  Lower extremities free of edema.  LYMPHATICS:  No neck or supraclavicular nodes.  SKIN:  No acute rash.  PSYCHIATRIC:  She is alert and oriented x3.   I appreciate the opportunity to care for this patient.     Gatha Mayer, MD,FACG  Electronically Signed    CEG/MedQ  DD: 09/28/2006  DT: 09/28/2006  Job #: [id number]   cc:   [name], M.D.  [name], M.D.

## 2010-05-28 NOTE — Consult Note (Signed)
VASCULAR SURGERY CONSULTATION   [name], [name]  DOB:  1940/11/13                                       06/01/2007  [id number]   I saw the patient in the office today in consultation concerning her  left lower extremity claudication.  She was referred by Dr. Katha Cabal.  This is a pleasant 70 year old woman who began having pain in  her left calf associated with ambulation and relieved with rest several  months ago.  Her symptoms have remained fairly stable over the last  several months.  She experiences pain in the left calf when she  ambulates approximately one block and her symptoms are relieved with  rest.  She has also had some bilateral hip pain which is aggravated by  walking and relieved with rest.  She does have a history of some mild  low back pain.  I do not get any history of rest pain or history of  nonhealing ulcers.   PAST MEDICAL HISTORY:  Her past medical history is significant for adult  onset diabetes.  She is insulin dependent.  In addition, she has  hypertension, hypercholesterolemia and a history of a myocardial  infarction in 2003.  She denies any history of congestive heart failure  or history of COPD.   She did undergo coronary revascularization by Dr. Servando Snare in December  of 2006.  Her vein was taken from the right leg.   FAMILY HISTORY:  Is significant for a brother who had coronary artery  disease in his mid 80s.  She is unaware of any other history of  premature cardiovascular disease.   SOCIAL HISTORY:  She is widowed.  She has three children.  She does not  use tobacco and has never used tobacco.   MEDICATIONS AND REVIEW OF SYSTEMS:  Her medications and review of  systems are documented on the medical history form in her chart.   PHYSICAL EXAMINATION:  General:  This is a pleasant 70 year old woman  who appears her stated age.  Vital signs:  Blood pressure is 143/69,  heart rate is 53.  Neck:  Neck is supple.   There is no cervical  lymphadenopathy.  I do not detect any carotid bruits.  Lungs:  Are clear  bilaterally to auscultation.  Cardiac:  She has a regular rate and  rhythm.  Abdomen:  Her abdomen is soft and nontender.  She has normal  pitched bowel sounds.  I cannot palpate an aneurysm.  Vascular:  She has  palpable femoral pulses.  On the right side she has a palpable popliteal  and posterior tibial pulse.  On the left side I cannot palpate a  popliteal pulse.  I do feel a weakly palpable dorsalis pedis pulse on  the left.  She has triphasic Doppler signals in the right foot with  monophasic Doppler signals in the left foot.  She has no significant  lower extremity swelling.  Neurologic:  Exam is nonfocal.   She did have a Doppler study in April which showed an ABI of 100% on the  right and 67% on the left.   Based on her exam she has evidence of superficial femoral artery  occlusive disease on the left.  She has relatively stable claudication  on the left with no history of rest pain or nonhealing ulcers.  I think  her hip pain could be related to arthritis or possibly lumbar disk  disease but I do not think she has evidence of aortoiliac occlusive  disease on exam.  I have her encouraged her to get on a structured  walking program and to continue the [place] which she is on.  We would  only consider arteriography and revascularization if her symptoms became  disabling or she developed progressive rest pain or a nonhealing ulcer.  I plan on seeing her back in 6 months.  She knows to call sooner if she  has problems.   Judeth Cornfield. Scot Dock, M.D.  Electronically Signed  CSD/MEDQ  D:  06/01/2007  T:  06/02/2007  Job:  [id number]   cc:   Katha Cabal, M.D.

## 2010-05-28 NOTE — Assessment & Plan Note (Signed)
OFFICE VISIT   TAITIANA, MATSUURA  DOB:  1940-08-25                                       11/30/2007  [id number]   I saw the patient in the office today for continued followup of her  claudication.  I had originally seen her in consultation in May of 2009.  At that time I felt that she likely had a left superficial femoral  artery occlusion.  Her symptoms were quite stable.  She comes in for  routine followup visit.  Since I saw her last in May, she has had stable  claudication in the left calf, which has improved slightly over the last  6 months.  She gets pain in her calf associated with ambulation and  relieved with rest.  This is limited to the calf and she had no  significant thigh or hip claudication.  She does have some hip pain  which oftentimes occur at night and is likely related to arthritis.  This is actually improved, however.  She had no significant symptoms on  the right side.  No history of rest pain or nonhealing ulcers.   REVIEW OF SYSTEMS:  She has had no recent chest pain, chest pressure,  palpitations or arrhythmias.  She does admit to some dyspnea on  exertion.  Review of systems is otherwise documented on the medical  history form in her chart.   PHYSICAL EXAMINATION:  This is a pleasant 70 year old woman who appears  her stated age.  Her blood pressure is 150/68, heart rate is 61.  I do  not detect any carotid bruits.  Lungs are clear bilaterally to  auscultation.  On cardiac exam, she has a regular rate and rhythm.  She  has no significant murmurs.  Abdomen:  Soft and nontender.  She has  normal femoral pulses and a palpable right popliteal and right dorsalis  pedis pulse.  I cannot palpate popliteal or pedal pulses on the left.  She does have biphasic Doppler signals in the right foot with an ABI of  100%.  On the left side ABI 73%.   This patient likely has a superficial femoral artery occlusion on the  left and her  symptoms have actually improved somewhat.  I have  encouraged her to stay as active as possible and continue walking as  much as possible.  Fortunately, she is not a smoker.  We will stretch  her followup out to a year-and-a-half and I will see her back at that  time.  She knows to call sooner if she has problems.  She understands we  generally would not consider revascularization unless she developed rest  pain or nonhealing ulcer.   Judeth Cornfield. Scot Dock, M.D.  Electronically Signed   CSD/MEDQ  D:  11/30/2007  T:  12/01/2007  Job:  [id number]

## 2010-05-28 NOTE — Discharge Summary (Signed)
[name], [name]              ACCOUNT NO.:  [id number]   MEDICAL RECORD NO.:  [id number]          PATIENT TYPE:  INP   LOCATION:  5506                         FACILITY:  [facility]   PHYSICIAN:  Birdie Riddle, M.D.  DATE OF BIRTH:  05-12-1940   DATE OF ADMISSION:  07/06/2007  DATE OF DISCHARGE:  07/08/2007                               DISCHARGE SUMMARY   REFERRED BY:  Lucy Chris, MD.   FINAL DIAGNOSES:  1. Dizziness.  2. Dehydration.  3. Chronic left heart systolic failure.  4. Coronary atherosclerosis, native vessel.  5. Status post coronary artery bypass graft surgery.  6. Diabetes mellitus type 2.  7. Anxiety.  8. Lumbar spinal stenosis with radiculopathy.   DISCHARGE MEDICATIONS:  1. Darvocet-N 100 one twice daily as needed.  2. [name] 15 mg 1 daily.  3. Glipizide XL 10 mg 1 twice daily.  4. Lantus 25-35 units at bedtime as directed.  5. Aspirin 325 mg 1 in the morning.  6. Diclofenac 15 mg 1 in the evening.  7. Protonix 40 mg 1 daily.  8. Diovan 160 mg 1 daily.  9. Furosemide 40 mg on Monday, Wednesday, Friday.  10.Klor-Con 10 mEq 1 on Monday, Wednesday, and Friday.  11.Atenolol 25 mg half tablet daily in the morning.  12.Pentoxifylline 400 mg 1 twice daily.  13.Calcium 600 mg 1 daily.  14.Nitro-Quick 0.4 mg 1 sublingual every 5 minutes x3 as needed for      chest pain.   DISCHARGE DIET:  Low-sodium heart healthy diet and carbohydrate modified  medium calorie diet.   WOUND CARE:  Not applicable.   ACTIVITY:  The patient to walk with rolling walker only.   SPECIFIC INSTRUCTIONS:  The patient to stop any activity that causes  chest pain, shortness of breath, dizziness, sweating, or excessive  weakness.   FOLLOWUP:  Follow up by Dr. Billy Coast in 3 weeks and by Dr. Lucy Chris in 2 weeks.   HISTORY:  This is a 70 year old white female presented with dizziness  and weakness.  The patient denied any vision changes, speech problems,  or loss of  consciousness.  No hyperglycemic episode either.  No chest  pain.   PHYSICAL EXAMINATION:  VITAL SIGNS:  Temperature 97.8, pulse 64,  respirations 18, and blood pressure 105/61.  GENERAL:  The patient is short and averagely built.  HEENT:  The patient is normocephalic and atraumatic, has blue eyes.  Pupil equal and reacting to light.  Extraocular movement intact, sclerae  clear.  Has dry tongue.  NECK:  Supple.  LYMPHADENOPATHY:  None.  LUNGS:  Clear.  HEART:  Normal S1 and S2 with occasional irregular rhythm.  ABDOMEN:  Soft and nontender.  EXTREMITIES:  No edema, cyanosis, or clubbing.  NEUROLOGICALLY:  Cranial nerves grossly intact.  The patient is alert  and oriented x3.  BACK:  Lower back tenderness.   LABORATORY DATA:  Hemoglobin, hematocrit, WBC count, platelet count were  normal.  Electrolytes:  BUN and creatinine borderline at 22 and 1.38.  INR was 1.0.  Cardiac enzymes negative x3.  B natriuretic peptide  elevated at 509.  Thyroid stimulating hormone was 1.53.   X-ray of the chest was unremarkable except for borderline cardiomegaly  and prior mediastinotomy.   MRI of the lumbar spine showed lumbar spine disease with a possible  radiculopathy.   Echocardiogram showed preserved LV systolic function with a mild mitral  valve regurgitation and moderate tricuspid valve regurgitation.   HOSPITAL COURSE:  The patient was admitted to telemetry unit.  Clinically, she was dry.  She received IV fluid.  Her Lasix was held and  her Cardizem and atenolol were also held.  Her condition improved in 24-  48 hours.  Her ultrasound of the heart showed preserved LV systolic  function.  She did not had any chest pain.  However, reviewing the x-  rays and MRI of back revealed she had spinal stenosis giving her  weakness in the lower extremities and pain in the back.  She was advised  to ambulate with the walker only and to get a Neurosurgery consult after  talking over with primary care  physician.  She will be followed by me in  3 weeks.      Birdie Riddle, M.D.  Electronically Signed     ASK/MEDQ  D:  07/08/2007  T:  07/08/2007  Job:  [id number]

## 2010-05-30 NOTE — Procedures (Unsigned)
DUPLEX DEEP VENOUS EXAM - LOWER EXTREMITY  INDICATION:  Pain right calf.  HISTORY:  Edema:  No. Trauma/Surgery:  Right greater saphenous vein was harvested for bypass 12/16/2004. Pain:  Yes. PE:  No. Previous DVT:  No. Anticoagulants:  325 mg aspirin daily. Other:  DUPLEX EXAM:               CFV   SFV   PopV  PTV    GSV               R  L  R  L  R  L  R   L  R  L Thrombosis    o  o  o     o     o Spontaneous   +  +  +     +     + Phasic        +  +  +     +     + Augmentation  +  +  +     +     + Compressible  +  +  +     +     + Competent     +  +  +     +     +  Legend:  + - yes  o - no  p - partial  D - decreased  IMPRESSION:  No evidence of acute deep vein thrombosis or superficial thrombophlebitis within the right lower extremity.  Pulsatile flow visualized within the right lower extremity suggestive of congestive heart failure.   _____________________________ Judeth Cornfield. Scot Dock, M.D.  OD/MEDQ  D:  05/24/2010  T:  05/24/2010  Job:  [id number]

## 2010-05-31 NOTE — Op Note (Signed)
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   SURGICAL PROCEDURE:  Coronary artery bypass grafting x4 with the left  internal mammary to the left anterior descending coronary artery, reverse  saphenous vein graft to the diagonal coronary artery, sequential reverse  saphenous vein graft to the circumflex and posterior descending arising off  the circumflex with right endovein harvesting.   SURGEON:  Lanelle Bal, MD.   FIRST ASSISTANT:  Suzzanne Cloud, P.A.   BRIEF HISTORY:  The patient is a 70 year old female who presented with vague  chest discomfort.  She was seen and evaluated by Dr. Doylene Canard who performed  cardiac catheterization.  This showed equivalent of left main disease with a  very small nondominant right coronary artery with significant stenosis of  the proximal LAD and circumflex with posterior descending arising from the  circumflex with greater than 80% stenosis in the posterior descending.  Because of the patient's significant three vessel disease, coronary artery  bypass grafting was recommended, the patient agreed and signed informed  consent.   DESCRIPTION OF THE PROCEDURE:  With Gordy Councilman and arterial line monitors in  place, the patient underwent general endotracheal anesthesia without  incident.  Skin of the chest and legs was prepped with Betadine and draped  in the usual sterile manner.  Using the [employer] endovein harvesting system,  vein was harvested from the right thigh and was  of adequate quality and  caliber.  A median sternotomy was performed.  The left internal mammary  artery was dissected down as pedicle graft.  The distal artery was divided  and had good free flow.  Pericardium was opened.  Overall ventricular  function appeared preserved.  The patient was systemically heparinized.  The  ascending aorta and the right atrium were cannulated.  In the aortic root, a  bent cardioplegia needle was introduced into the ascending aorta.  The  patient was placed on cardiopulmonary bypass 2.4 L/min per m2.  Sites of  anastomosis were selected and dissected out of the epicardium.  The  patient's body temperature was cooled to 30 degrees, aortic crossclamp was  applied and 500 mL of cold blood potassium cardioplegia was administered  with rapid diastolic arrest.  Heart and myocardial septal temperatures were  monitored after crossclamp.  Attention was turned first to the distal  circumflex vessel which was opened and admitted a 1.5 mm probe; using a  diamond type, side-to-side anastomosis was carried out.  The posterior  descending coronary artery was a smaller vessel and arose from the  circumflex which was opened and using a running 7-0 Prolene distal  anastomosis was performed.  Attention was then turned to the diagonal  coronary artery which was opened and admitted a 1.5 mm probe.  Using a  running 7-0 Prolene, a distal anastomosis was performed.  Attention was then  turned to the left anterior descending coronary artery.  With a running 8-0  Prolene, the left internal mammary artery was anastomosed to the left  anterior descending coronary artery.  With release noted to bulldog on the  mammary artery, there was appropriate rise in myocardial septal temperature.  With the cross clamps still in place, additional cardioplegia was  administered.  Bulldog was replaced back on the mammary artery and, with the  cross clamp still in place, two punch aortotomies were  performed.  Each of  the two vein grafts were anastomosed to the ascending aorta, air was  evacuated from the grafts and the partial occlusion clamp was removed.  Sites of anastomosis were inspected and were free of bleeding.  The patient  was then ventilated and weaned without difficulty and remained  hemodynamically stable, was decannulated in the usual fashion.  Protamine  sulfate was administered with the operative field hemostatic.  Atrial tube  ventricular pacing wires were applied, graft markers were applied, the left  pleural tube and two mediastinal tubes were left in place.  The sternum was  closed with #6 stainless steel wire, the fascia closed with interrupted 0  Vicryl, running 3-0 Vicryl and subcutaneous tissue, a 4-0 subcuticular  stitch in the skin edges.  Dry dressings were applied.  The sponge and  needle count was reported as correct at the completion of the procedure.  The patient tolerated the procedure without obvious complication and was  transferred to the surgical intensive care unit for further postoperative  care.      Lanelle Bal, MD  Electronically Signed     EG/MEDQ  D:  12/22/2004  T:  12/22/2004  Job:  [id number]   cc:   Birdie Riddle, M.D.  Fax: [phone]

## 2010-05-31 NOTE — H&P (Signed)
[name], [name]              ACCOUNT NO.:  [id number]   MEDICAL RECORD NO.:  [id number]          PATIENT TYPE:  INP   LOCATION:  2023                         FACILITY:  [facility]   PHYSICIAN:  Birdie Riddle, M.D.  DATE OF BIRTH:  11-21-1940   DATE OF ADMISSION:  12/13/2004  DATE OF DISCHARGE:                                HISTORY & PHYSICAL   CHIEF COMPLAINT:  Chest pain.   HISTORY OF PRESENT ILLNESS:  This 70 year old white female complains of  recurrent chest pain for the last 8-10 months. The patient kept on  postponing need for cardiac catheterization and continued to have exertional  dyspnea. A nuclear stress-test was without ischemia in February of 2006. She  had EKG changes of ischemia and atypical symptoms.   PAST MEDICAL HISTORY:  1.  Positive for diabetes mellitus x3 years.  2.  Hypertension x10 years.  3.  No history of smoking. No history of alcohol or drug use.  4.  Positive history of elevated cholesterol level.  5.  Myocardial infarction in August of 2003 when she had a left circumflex      coronary artery stent placed at [facility] in      [place], [place].  6.  History of obesity and no history of exercise. No family history of      premature coronary artery disease.   PAST SURGICAL HISTORY:  1.  Hysterectomy 20 years ago.  2.  Carpal tunnel surgery, both hands (10 and 15 years ago).   MEDICATIONS:  1.  Vytorin 10/40 one daily.  2.  Hydrochlorothiazide 25 mg one daily.  3.  Atenolol 50 mg one daily.  4.  Glucotrol XL 5 mg one daily.  5.  Lisinopril 20 mg one daily.  6.  Pepcid 40 mg one daily.  7.  Aspirin 81 mg one daily.  8.  Aleve 220 mg tablet two daily p.r.n.   ALLERGIES:  None.   SOCIAL HISTORY:  The patient is a widow. Husband died of emphysema at age  17. The patient has two sons and two daughters. One son died in an auto  accident 21 years ago. The patient is a retired Engineer, materials.   FAMILY HISTORY:   Mother died at age 62, father died but not known. The  patient has 4 brothers, 1 died of cirrhosis of the liver; she has 4 sisters,  all living.   REVIEW OF SYSTEMS:  The patient admits to weight gain of 20 pounds in 2  years. Positive history of vision change with need to wear glasses. Does not  have history of cataract surgery. No hearing loss. No tinnitus. No  rhinorrhea. She wears full upper and lower dentures. No history of  hemotpysis, asthma, COPD, pneumonia, palpitations, chest pain, leg edema,  nausea, vomiting, diarrhea, constipation, GI bleed, stomach ulcer, hiatal  hernia, hepatitis, blood transfusion, kidney stone, stroke, seizures or  psychiatric admissions. Positive history of dyspnea on exertion, leg  claudication, skin rash and joint pains.   IMMUNIZATIONS:  She had a tetanus/diphtheria immunization 11 years ago. She  had a  flu shot 1 month ago. She had Pneumovax.   PHYSICAL EXAMINATION:  VITAL SIGNS:  Pulse 76, respiration 14, blood  pressure 140/70. Height 5 feet, 2 inches, weight 160 pounds.  GENERAL:  The patient is alert and oriented x3, in no acute distress.  HEENT:  Head normocephalic, atraumatic. Eyes are blue, pupils equal and  react to light. Extraocular movements are intact. Conjunctivae pink and  sclerae anicteric.  Mucous membranes pink and moist.  NECK:  No JVD, no carotid bruit. Full range of motion.  LUNGS:  Clear bilaterally.  HEART:  Normal S1, S2.  ABDOMEN:  Soft and nontender.  EXTREMITIES:  No clubbing, cyanosis or edema.  CNS:  Cranial nerves grossly intact. Bilateral equal grips. Patient is right  handed.   LABORATORY DATA:  Revealed a normal hemoglobin, hematocrit, WBC count,  platelet count. Electrolytes nearly normal with a potassium of 5.5.  BUN/creatinine slightly high with a creatinine of 1.6. PT, PTT normal.   EKG normal sinus rhythm with nonspecific T wave changes.   Cardiac catheterization done today showed a significant ostial  left anterior  descending and left circumflex coronary artery disease with a preserved left  ventricular systolic function.   ASSESSMENT:  1.  Severe native vessel coronary artery disease.  2.  Diabetes mellitus.  3.  Hypertension.   PLAN:  Admit patient to a telemetry bed, start IV heparin, nitroglycerin,  and refer to cardiovascular/thoracic surgeon for possible coronary artery  bypass graft surgery.      Birdie Riddle, M.D.  Electronically Signed     ASK/MEDQ  D:  12/13/2004  T:  12/13/2004  Job:  [id number]

## 2010-05-31 NOTE — Discharge Summary (Signed)
[name], [name]              ACCOUNT NO.:  [id number]   MEDICAL RECORD NO.:  [id number]          PATIENT TYPE:  INP   LOCATION:  2009                         FACILITY:  [facility]   PHYSICIAN:  Lanelle Bal, MD    DATE OF BIRTH:  1940-01-15   DATE OF ADMISSION:  12/13/2004  DATE OF DISCHARGE:  12/19/2004                                 DISCHARGE SUMMARY   PRIMARY DISCHARGE DIAGNOSIS:  Coronary artery disease.   SECONDARY DIAGNOSES:  1.  Hypetension.  2.  Diabetes mellitus.  3.  Hyperlipidemia.  4.  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  5.  Chronic renal insufficiency.  6.  Status post hysterectomy.  7.  Status post bilateral carpal tunnel surgery.   ALLERGIES:  No known drug allergies.   OPERATION/PROCEDURE:  Coronary artery bypass grafting x4 using the left  internal mammary artery to the left anterior descending, saphenous vein  graft to the first branch of the diagonal, saphenous vein graft to the  distal circumflex with sequential to the posterior descending artery.  Noted  endovascular vein harvesting from the right leg was done.   HISTORY OF PRESENT ILLNESS AND HOSPITAL COURSE:  [name] is a 70-year-  old female with known coronary artery disease having had a myocardial  infarction in August 2003 when she was treated with a stent in [facility]. The patient presented to Dr. ____________ with increasing  shortness of breath with exertion and increasing fatigue with exertion but  no definite angina or tight chest pain.  The patient has a  history of hypertension, diabetes mellitus, peripheral vascular disease.  Dr. Doylene Canard took the patient cardiac catheterization lab where he did  cardiac catheterization on   DICTATION ENDED AT THIS POINT      Darlin Coco, [place]      Lanelle Bal, MD  Electronically Signed    KMD/MEDQ  D:  12/19/2004  T:  12/19/2004  Job:  [id number]   cc:    Birdie Riddle, M.D.  Fax: [phone]

## 2010-05-31 NOTE — Consult Note (Signed)
NAMELUNDYN, MEMMOTT              ACCOUNT NO.:  [id number]   MEDICAL RECORD NO.:  [id number]          PATIENT TYPE:  OIB   LOCATION:  2023                         FACILITY:  [facility]   PHYSICIAN:  Lanelle Bal, MD    DATE OF BIRTH:  [birth year]   DATE OF CONSULTATION:  12/13/2004  DATE OF DISCHARGE:                                   CONSULTATION   REQUESTING PHYSICIAN:  Dr. Doylene Canard.   FOLLOW-UP CARDIOLOGIST:  Dr. Doylene Canard.   PRIMARY CARE PHYSICIAN:  ____[facility].   REASON FOR CONSULTATION:  Coronary occlusive disease.   HISTORY OF PRESENT ILLNESS:  The patient is a 70 year old female with known  coronary artery disease having had a myocardial infarction in August of 2003  when she was treated with a stent in [facility].  At that  time she had chest pain.  Now she notes increasing shortness of breath with  exertion and increasing fatigue with exertion but no definite anginal type  chest pain.  She has not had a recent acute myocardial infarction.   CARDIAC RISK FACTORS:  Cardiac risk factors include hypertension for eleven  years, diabetes for four years.  She is unaware of her hemoglobin A1C or if  it has ever been checked.  She does check her glucoses once a day.  She has  hyperlipidemia, does not smoke, does have a positive family history, had one  brother who has had bypass surgery and mother who had myocardial infarction  and died at age 79.  The patient denies any history of stroke.  She does  have peripheral vascular disease with claudication in her left leg radiating  to the left hip.  Baseline creatinine was 1.5 on 12/12/2004.   PAST MEDICAL HISTORY:  Past medical history including surgical history:  1.  Hysterectomy in 1980's.  2.  Bilateral carpal tunnel surgery in 1982 and 1997.   SOCIAL HISTORY:  The patient is widowed, lives in [place] but does have  three children and a friend who will assist her at home post  surgery.  She  is a retired Psychologist, educational.  Does not drink any alcohol.   MEDICATIONS:  1.  Hydrochlorothiazide 25 daily.  2.  Atenolol 25 daily.  3.  Glucotrol XL 5 mg a day.  4.  Lisinopril 20 mg daily.  5.  Pepcid 40 mg daily.  6.  Aspirin 81 mg.  7.  She has a history of taking Aleve and Voltaren in the past but she      denies any recent usage of Aleve.  8.  She received one 75 mg pill of Plavix today.  9.  She is also on dichorenae 100 mg daily.   ALLERGIES:  She has no drug allergies.   CARDIAC REVIEW OF SYSTEMS:  Chest pain as noted above, denies resting  shortness of breath, does have exertional shortness of breath with walking  and with doing work around her house.  She notes increasing fatigue.  Denies  syncope, presyncope or orthopnea.  She noted increasing palpitations about  six months ago  as she drank more coffee.  This has decreased.  She has had  bilateral lower extremity edema for several months.   GENERAL REVIEW OF SYSTEMS:  CONSTITUTIONAL:  Denies constitutional symptoms  other than increasing fatigue.  RESPIRATORY:  Denies hemoptysis.  GASTROINTESTINAL:  Has a history of constipation but without blood in her  stool.  NEUROLOGIC:  Has a history of peripheral neuropathy.  She does note  her hands getting numb with repetitive motion.  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  GENITOURINARY:  Notes  urinary frequency.  Denies any recent infections.  HEMATOLOGIC:  Denies  hematologic easy bruisability.  PSYCHIATRIC:  Denies psychiatric history.  EXTREMITIES:  Does complain of claudication, left greater than right.   STUDIES:  Doppler studies show ABI of 0.96 on the right and 0.72 on the  left, bilateral carotid stenosis of 40 to 60% on the right and 60 to 80% on  the left.  She has upper and lower dentures.   PHYSICAL EXAMINATION:  VITAL SIGNS:  Blood pressure 162/68, pulse of 65,  respiratory rate 18, O2 sat is 95%  on room air, height 60.5 inches, weighs  168 pounds.  GENERAL:  The patient appears her stated age of 89 years.  She is in no  distress on IV nitroglycerin.  HEENT:  Pupils equal, round, reactive to light.  Neck is without carotid  bruits or jugular venous distention.  CHEST:  Chest reveals clear breath sounds bilaterally.  CARDIAC:  Cardiac exam reveals regular rate and rhythm without murmurs or  gallops.  ABDOMEN:  Abdominal exam is benign without palpable masses or organomegaly.  EXTREMITIES:  Her right groin calf site appears without hematoma.  She has  palpable DP pulses bilaterally but no posterior tibial pulses bilaterally.   LABORATORY:  Laboratory findings:  White count is 5.5, hematocrit is 31.3,  platelet count 185, PT is 11.4, INR is 1, sodium 142, K is 5.5, BUN 25,  creatinine 1.5.  Cardiac cath shows mild anterior hypokinesis with ejection  fraction 60%, no aortic valve gradient.  She has ostial 70% LAD, mid 60 to  70%; the right coronary artery was very small; she has areas in the  circumflex of 70%.   IMPRESSION:  A patient with diabetes and increasing fatigue and vague chest  discomfort with cardiac cath findings of left main equivalent with stenoses  of the LAD and circumflex and with a very small nondominant right.  Because  of the patient's three vessel critical coronary disease, coronary artery  bypass grafting is recommended.  The risks of the procedure including death,  infection, stroke, myocardial infarction, bleeding and blood transfusion  were all discussed with the patient in detail.  Her questions have been  answered and she is willing to proceed.  Tentatively plan for surgery on  Monday, 12/4.      Lanelle Bal, MD  Electronically Signed     EG/MEDQ  D:  12/13/2004  T:  12/13/2004  Job:  [id number]   cc:   Birdie Riddle, M.D.  Fax: [phone]

## 2010-05-31 NOTE — Discharge Summary (Signed)
[name], [name] NO.:  [id number]   MEDICAL RECORD NO.:  [id number]          PATIENT TYPE:  INP   LOCATION:  2009                         FACILITY:  [facility]   PHYSICIAN:  Lanelle Bal, MD    DATE OF BIRTH:  03/06/1940   DATE OF ADMISSION:  12/13/2004  DATE OF DISCHARGE:                                 DISCHARGE SUMMARY   PRINCIPAL DIAGNOSIS:  Coronary artery disease.   SECONDARY DIAGNOSES:  1.  Hypertension.  2.  Hyperlipidemia.  3.  History of peripheral vascular disease.  4.  History of coronary artery disease, status post myocardial infarction      with stent placement in 2003.  5.  Diabetes mellitus.  6.  Chronic renal insufficiency.  7.  Status post hysterectomy.  8.  Status post bilateral carpal tunnel surgery.   IN-HOSPITAL OPERATIONS/PROCEDURES:  Coronary artery bypass grafting times  four using left internal mammary artery to left anterior descending,  saphenous vein graft to the first branch of the diagonal, saphenous vein  graft to the distal circumflex, sequential to posterior descending artery.  Endovascular vein harvesting of the right leg was done.   HISTORY AND PHYSICAL/HOSPITAL COURSE:  The patient is a 70 year old female  with a past medical history of coronary artery disease, status post  myocardial infarction and placement of stents at [facility].  The patient recently presented with increasing shortness of breath with  exertion, increasing fatigue with exertion, but no definite anginal type  chest pain. The patient was seen and evaluated by Dr. Doylene Canard. Dr. Doylene Canard  brought the patient to undergo cardiac catheterization. This was done on  December 13, 2004.  This showed the left main to have a distal 70% narrowing.  The LAD had an ostial 60% to 70% lesion.  There is a proximal 50% to 60%  blockage. The posterior descending artery had a long steal to the proximal  70% to 80% lesion. Following catheterization [facility]  was consulted. The patient  was seen and evaluated by Dr. Servando Snare. Dr. Servando Snare discussed with the  patient undergoing coronary artery bypass grafting. He discussed the risks  and benefits of this procedure. The patient acknowledged her understanding  and wished to proceed. Surgery was scheduled for December 16, 2004.   The patient was taken to the operating room on December 16, 2004, where she  underwent coronary artery bypass grafting times four using a left internal  mammary artery to a left anterior descending, saphenous vein graft to the  first branch of the diagonal, saphenous vein graft to the distal circumflex  sequential to the posterior descending artery. The patient tolerated this  procedure well and was transferred up to the intensive care unit in stable  condition. The patient was extubated the evening of surgery. She was seen to  be hemodynamically stable postoperatively. The patient's postoperative  course was pretty much unremarkable. Her chest tubes and lines were  discontinued  in the normal fashion. The patient was transferred to 2000 on  postoperative day two.  She was out of bed ambulating well. The patient  remained in  normal sinus rhythm. Her incisions were dry, intact, and healing  well. She was able to be weaned off oxygen, saturating greater than 90%  prior to discharge home. Her postoperative chest x-ray was stable, on  pneumonia, and no pneumothorax. She continued to remain hemodynamically  stable. Her creatinine remained stable postoperatively.   The patient is tentatively ready for discharge on December 20, 2004,  postoperative day four.  Follow-up appointment was scheduled with Dr.  Servando Snare on January 17, 2004, at 11:30 a.m.  The patient is to obtain a PA  and lateral chest x-ray  one prior to his appointment. She is to contact Dr.  Merrilee Jansky office and schedule a follow-up appointment in two weeks. Mr.  Crespin received instruction on diet, activity level,  and incisional care.  She was told no driving until released to do so, no heavy lifting over 10  pounds. The patient is told to ambulate three to four times per day,  progress as tolerated. She was told to continue her breathing exercises. She  is told she is allowed to shower, washing her incisions using soap and  water. She is to contact the office she develops  any drainage or opening  from any of her incision sites. The patient is educated on diet, activity,  low fat, low salt. She acknowledged her understanding.   DISCHARGE MEDICATIONS:  1.  Aspirin 325 mg p.o. daily.  2.  Vytorin 10/40 mg p.o. q.h.s.  3.  Pepcid 40 mg p.o. daily.  4.  Hydrochlorothiazide 25 mg p.o. daily.  5.  Atenolol 50 mg p.o. daily.  6.  Glucotrol XL 10 mg p.o. daily.  7.  Lisinopril 20 mg p.o. daily.  8.  Avandia 2 mg p.o. daily.  9.  Klor-Con 20 mEq p.o. daily.  10. Ultram 50 mg one to two tabs p.o. q.4-6h. p.r.n. pain.      Darlin Coco, [place]      Lanelle Bal, MD  Electronically Signed    KMD/MEDQ  D:  12/19/2004  T:  12/19/2004  Job:  [id number]   cc:   Birdie Riddle, M.D.  Fax: [phone]

## 2010-05-31 NOTE — Cardiovascular Report (Signed)
[name], [name]              ACCOUNT NO.:  [id number]   MEDICAL RECORD NO.:  [id number]          PATIENT TYPE:  OIB   LOCATION:  2023                         FACILITY:  [facility]   PHYSICIAN:  Birdie Riddle, M.D.  DATE OF BIRTH:  1940/07/02   DATE OF PROCEDURE:  DATE OF DISCHARGE:                              CARDIAC CATHETERIZATION   REFERRING PHYSICIAN:  [facility], Dr. Derrel Nip.   1.  Left heart catheterization.  2.  Selective coronary angiography.  3.  Left ventricular function study.  4.  Left internal mammary artery injection.   INDICATION:  This 70 year old white female with a known coronary artery  disease and left circumflex artery stent placement had chest pain and  abnormal EKG.   APPROACH:  The right femoral artery using 4-French sheath and catheters.   COMPLICATIONS:  None.   A no-torque right coronary artery catheter was used for right coronary  artery injection and a 300 cc saline bolus was given for low blood pressure  after the left coronary artery injections.   HEMODYNAMIC DATA:  1.  The left ventricular pressure was 146/20.  2.  Aortic pressure was 145/59, by 94.   LEFT VENTRICULOGRAM:  The left ventriculogram showed a mild anterior wall  hypokinesia with ejection fraction of 60%.   CORONARY ANATOMY:  1.  The left main coronary artery was short with a distal 30% narrowing.  2.  The left anterior descending coronary artery.  The left anterior      descending coronary artery had ostial 60-70% eccentric lesion, and a 30%      proximal long lesion, and a 50-60% mid vessel lesion near the origin of      the diagonal-2 vessel, then the left anterior descending coronary artery      wrapped around the apex of the heart supplying distal one third of the      posterior septum.  Its diagonal-1 branch was a small vessel and diagonal-      2 had luminal irregularities.  3.  The left circumflex coronary artery.  The left circumflex  coronary      artery was dominant.  It had a ramus branch that was small, obtuse      marginal branch that was unremarkable, obtuse marginal branch-2 was      again small, and obtuse marginal branch-3 was a large vessel.  The      posterolateral branch had ostial 20% disease and posterior descending      coronary artery had a long ostial to proximal 70-80% lesion.  The left      circumflex coronary artery itself had an  ostial 70% stenosis and a mid      left circumflex stent was patent with 10-20% restenosis.  4.  The right coronary artery.  The right coronary artery was a very small      vessel.  5.  The left internal mammary artery injection.  The left internal mammary      artery was widely patent.   IMPRESSION:  1.  Severe two-vessel native vessel coronary artery disease (left  main      equivalent).  2.  Preserved left ventricular systolic function.  3.  Patent left internal mammary artery.   RECOMMENDATIONS:  This patient will have a cardiovascular thoracic surgeon  consultation for possible coronary artery bypass graft surgery.      Birdie Riddle, M.D.  Electronically Signed     ASK/MEDQ  D:  12/13/2004  T:  12/13/2004  Job:  [id number]

## 2010-06-06 ENCOUNTER — Encounter: Payer: PRIVATE HEALTH INSURANCE | Admitting: Internal Medicine

## 2010-06-17 ENCOUNTER — Encounter: Payer: PRIVATE HEALTH INSURANCE | Admitting: Internal Medicine

## 2010-07-18 ENCOUNTER — Other Ambulatory Visit: Payer: Self-pay | Admitting: *Deleted

## 2010-07-18 ENCOUNTER — Ambulatory Visit (INDEPENDENT_AMBULATORY_CARE_PROVIDER_SITE_OTHER): Payer: PRIVATE HEALTH INSURANCE | Admitting: Internal Medicine

## 2010-07-18 ENCOUNTER — Encounter: Payer: Self-pay | Admitting: Internal Medicine

## 2010-07-18 DIAGNOSIS — I2510 Atherosclerotic heart disease of native coronary artery without angina pectoris: Secondary | ICD-10-CM

## 2010-07-18 DIAGNOSIS — I10 Essential (primary) hypertension: Secondary | ICD-10-CM

## 2010-07-18 DIAGNOSIS — N259 Disorder resulting from impaired renal tubular function, unspecified: Secondary | ICD-10-CM

## 2010-07-18 DIAGNOSIS — E785 Hyperlipidemia, unspecified: Secondary | ICD-10-CM

## 2010-07-18 DIAGNOSIS — I739 Peripheral vascular disease, unspecified: Secondary | ICD-10-CM

## 2010-07-18 DIAGNOSIS — E119 Type 2 diabetes mellitus without complications: Secondary | ICD-10-CM

## 2010-07-18 LAB — BASIC METABOLIC PANEL
BUN: 26 mg/dL — ABNORMAL HIGH (ref 6–23)
CO2: 32 mEq/L (ref 19–32)
Chloride: 101 mEq/L (ref 96–112)
Creat: 1.55 mg/dL — ABNORMAL HIGH (ref 0.50–1.10)
Glucose, Bld: 171 mg/dL — ABNORMAL HIGH (ref 70–99)
Potassium: 4.3 mEq/L (ref 3.5–5.3)

## 2010-07-18 NOTE — Assessment & Plan Note (Signed)
Lab Results  Component Value Date   NA 140 04/22/2010   K 3.7 04/22/2010   CL 99 04/22/2010   CO2 33* 04/22/2010   BUN 33* 04/22/2010   CREATININE 1.90* 04/22/2010    BP Readings from Last 3 Encounters:  07/18/10 113/53  04/18/10 106/55  12/20/09 131/70    Assessment: Hypertension control:  controlled  Progress toward goals:  at goal Barriers to meeting goals:  no barriers identified  Plan: Hypertension treatment:  continue current medications Will check BMET today as last Cr slightly elevated beyond patient's baseline.

## 2010-07-18 NOTE — Progress Notes (Signed)
  Subjective:    Patient ID: [name], female    DOB: [birth year], 70 y.o.   MRN: [id number]  HPI [name] is a 70 yo F with PMH of CAD s/p CABG 2006, with EF 30-35% (06/2008) followed by Dr. Doylene Canard, DM and HTN who presents for follow-up. She was last seen in clinic April 2012 for DM follow up.  Since then patient was admitted to [facility] under cardiology for further evaluation of dizziness. During that admission patient was ruled out for MI, and had 2D echo and nuclear stress test done.  Nuclear stress test showed no fixed or reversible perfusion defect with an ejection fraction of 77% and wall motion was unremarkable.    Two-D echocardiogram showed mild concentric left ventricular hypertrophy with normal systolic function and mild mitral valve regurgitation with  calcified annulus.     1.) DM - Pt has been taking 3 units of regular insulin before meals, but admits to forgetting on average once daily. She checks her CBGs 3 times daily and states am sugars range between 100-140, with lunch and dinner sugars ranging 180-300. Denies any hypoglycemic symptoms.  2.) Dizziness - resolved. As mentioned above, patient was admitted in May for dizziness. All tests were wnl and it was likely attributed to medications as patient was on gabapentin and trental which have side effect profile of dizziness. These medications were discontinued and patient is no longer taking them.   Patient has no other complaints or concerns today. She denies chest pain, cough, sob, headache, N/V, changes in abdominal and urinary character.     Review of Systems  All other systems reviewed and are negative.       Objective:   Physical Exam  Constitutional: She appears well-developed.  HENT:  Head: Normocephalic and atraumatic.  Eyes: Pupils are equal, round, and reactive to light.  Neck: Neck supple.  Cardiovascular: Normal rate and regular rhythm.   Pulmonary/Chest: Effort normal and breath sounds normal.    Abdominal: Soft. She exhibits no distension. There is no tenderness.  Musculoskeletal: Normal range of motion.  Neurological:       nonfocal          Assessment & Plan:

## 2010-07-18 NOTE — Patient Instructions (Signed)
Please follow up with Dr. Ina Homes in 3 months.

## 2010-07-21 MED ORDER — ESOMEPRAZOLE MAGNESIUM 20 MG PO PACK: 20.0000 mg | PACK | Freq: Every day | ORAL | Status: DC

## 2010-07-21 MED ORDER — INSULIN PEN NEEDLE 32G X 4 MM MISC: Status: DC

## 2010-07-21 MED ORDER — ROSUVASTATIN CALCIUM 40 MG PO TABS: 40.0000 mg | ORAL_TABLET | Freq: Every day | ORAL | Status: DC

## 2010-07-21 MED ORDER — GLUCOSE BLOOD VI STRP: ORAL_STRIP | Status: DC

## 2010-07-21 MED ORDER — POTASSIUM CHLORIDE 10 MEQ PO TBCR: 10.0000 meq | EXTENDED_RELEASE_TABLET | Freq: Every day | ORAL | Status: DC

## 2010-07-21 MED ORDER — ATENOLOL 25 MG PO TABS: 25.0000 mg | ORAL_TABLET | Freq: Every day | ORAL | Status: DC

## 2010-07-22 NOTE — Telephone Encounter (Signed)
Prescriptions were called to pt's Pharmacy.Marland Kitchen

## 2010-07-24 ENCOUNTER — Encounter: Payer: Self-pay | Admitting: Internal Medicine

## 2010-07-24 NOTE — Assessment & Plan Note (Signed)
Lab Results  Component Value Date   HGBA1C 7.6 07/18/2010   HGBA1C  Value: 8.9 (NOTE)                                                                       According to the ADA Clinical Practice Recommendations for 2011, when HbA1c is used as a screening test:   >=6.5%   Diagnostic of Diabetes Mellitus           (if abnormal result  is confirmed)  5.7-6.4%   Increased risk of developing Diabetes Mellitus  References:Diagnosis and Classification of Diabetes Mellitus,Diabetes [id number] 1):S62-S69 and Standards of Medical Care in         Diabetes - 2011,Diabetes [id number]  (Suppl 1):S11-S61.* 04/22/2010   CREATININE 1.55* 07/18/2010   CREATININE 1.90* 04/22/2010   MICROALBUR 1.98* 06/13/2009   MICRALBCREAT 81.1* 06/13/2009   CHOL  Value: 132        ATP III CLASSIFICATION:  <200     mg/dL   Desirable  200-239  mg/dL   Borderline High  >=240    mg/dL   High        04/23/2010   HDL 35* 04/23/2010   TRIG 249* 04/23/2010    Assessment: Diabetes control: controlled Progress toward goals: improved Barriers to meeting goals: occasionally forgetting to use meal coverage  Plan: Diabetes treatment: continue current medications Increase meal coverage to 5 units before meals and encourage using meal coverage daily. Refer to: none Instruction/counseling given: reminded to get eye exam, reminded to bring blood glucose meter & log to each visit and reminded to bring medications to each visit

## 2010-07-24 NOTE — Assessment & Plan Note (Signed)
Patient was recently seen by Vascular surgery regarding follow up of intermittent claudication symptoms. Per Dr. 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  Her only symptoms are right calf pain which is relieved with elevation.   - encourage her simply to elevate her leg as needed for the pain and will be happy to see her back at any time if any new   vascular issues arise.

## 2010-07-24 NOTE — Assessment & Plan Note (Signed)
CKD Stage 3. Patient has had acute on chronic renal insufficiency in the past. CKD secondary to long-standing HTN and DM. Cr checked and has come down from 1.9 to 1.55 which is around patient's baseline. Will hold off on referral to renal at this time.  Comprehensive Metabolic Panel:    Component Value Date/Time   BUN 26* 07/18/2010 1530   CREATININE 1.55* 07/18/2010 1530   CREATININE 1.90* 04/22/2010 1820

## 2010-07-24 NOTE — Assessment & Plan Note (Signed)
Subjective:     Patient is a 70 y.o. female who presents for follow-up of atherosclerotic coronary artery disease. Recent history: taking medications as instructed, no medication side effects noted, no TIA's, no chest pain on exertion, no dyspnea on exertion and no swelling of ankles. Patient's symptoms have been unchanged. Medication side effects include: none.  Cardiographics Nuclear stress done in May 2012 wnl, showing no reversible defect. Two-D echocardiogram showed mild concentric left ventricular hypertrophy with normal systolic function and mild mitral valve regurgitation with calcified annulus    Assessment:    Atherosclerotic coronary artery disease.   Plan:   Continue current medicatios, which include statin, ASA and beta blocker.

## 2010-08-06 ENCOUNTER — Other Ambulatory Visit: Payer: Self-pay | Admitting: *Deleted

## 2010-08-06 DIAGNOSIS — I5022 Chronic systolic (congestive) heart failure: Secondary | ICD-10-CM

## 2010-08-06 DIAGNOSIS — I10 Essential (primary) hypertension: Secondary | ICD-10-CM

## 2010-08-06 MED ORDER — VALSARTAN 320 MG PO TABS: 320.0000 mg | ORAL_TABLET | Freq: Every day | ORAL | Status: DC

## 2010-08-06 MED ORDER — GLIPIZIDE 10 MG PO TABS: 10.0000 mg | ORAL_TABLET | Freq: Two times a day (BID) | ORAL | Status: DC

## 2010-08-06 MED ORDER — "INSULIN SYRINGE 31G X 5/16"" 1 ML MISC": 1.0000 | Freq: Once | Status: DC

## 2010-08-06 NOTE — Telephone Encounter (Signed)
Called to pharm 

## 2010-08-20 ENCOUNTER — Telehealth: Payer: Self-pay | Admitting: *Deleted

## 2010-08-20 NOTE — Telephone Encounter (Signed)
Call from pt said that she has a spot in her mouth that is causing pain when she eats.  Says that she thinks her dentures are rubbing on her gums.  Pt said that she would like to see a Dentist if possible.  Pt was given an appointment for 08/21/2010 to come in to her her gums assessed.

## 2010-08-21 ENCOUNTER — Ambulatory Visit (INDEPENDENT_AMBULATORY_CARE_PROVIDER_SITE_OTHER): Payer: PRIVATE HEALTH INSURANCE | Admitting: Internal Medicine

## 2010-08-21 VITALS — BP 119/52 | HR 81 | Temp 98.2°F | Ht 60.0 in | Wt 141.4 lb

## 2010-08-21 DIAGNOSIS — E119 Type 2 diabetes mellitus without complications: Secondary | ICD-10-CM

## 2010-08-21 DIAGNOSIS — I10 Essential (primary) hypertension: Secondary | ICD-10-CM

## 2010-08-21 DIAGNOSIS — K1370 Unspecified lesions of oral mucosa: Secondary | ICD-10-CM

## 2010-08-21 DIAGNOSIS — K121 Other forms of stomatitis: Secondary | ICD-10-CM

## 2010-08-21 DIAGNOSIS — M25559 Pain in unspecified hip: Secondary | ICD-10-CM

## 2010-08-21 MED ORDER — SITAGLIPTIN PHOSPHATE 50 MG PO TABS: 50.0000 mg | ORAL_TABLET | Freq: Every day | ORAL | Status: DC

## 2010-08-21 MED ORDER — POTASSIUM CHLORIDE 10 MEQ PO TBCR: 10.0000 meq | EXTENDED_RELEASE_TABLET | Freq: Every day | ORAL | Status: DC

## 2010-08-21 MED ORDER — ESOMEPRAZOLE MAGNESIUM 40 MG PO CPDR: 40.0000 mg | DELAYED_RELEASE_CAPSULE | Freq: Every day | ORAL | Status: DC

## 2010-08-21 MED ORDER — TRAMADOL HCL 50 MG PO TABS: 50.0000 mg | ORAL_TABLET | Freq: Four times a day (QID) | ORAL | Status: DC | PRN

## 2010-08-21 MED ORDER — "INSULIN SYRINGE-NEEDLE U-100 31G X 5/16"" 0.3 ML MISC": Status: DC

## 2010-08-21 MED ORDER — INSULIN REGULAR HUMAN 100 UNIT/ML IJ SOLN: 3.0000 [IU] | Freq: Three times a day (TID) | INTRAMUSCULAR | Status: DC

## 2010-08-21 MED ORDER — "INSULIN SYRINGE-NEEDLE U-100 31G X 5/16"" 0.5 ML MISC": Status: DC

## 2010-08-21 MED ORDER — INSULIN GLARGINE 100 UNIT/ML ~~LOC~~ SOLN: 43.0000 [IU] | Freq: Every day | SUBCUTANEOUS | Status: DC

## 2010-08-21 NOTE — Patient Instructions (Signed)
Please schedule a follow up appointment with your PCP in 2 months or early if needed. Please take your medicines as prescribed. I have sent tour medicines electronically to your pharmacy.

## 2010-08-21 NOTE — Progress Notes (Signed)
  Subjective:    Patient ID: [name], female    DOB: [birth year], 70 y.o.   MRN: [id number]  HPI: 70 year old woman with past medical history significant for type 2 diabetes mellitus, hypertension comes to the clinic for soreness in her mouth.  She states that she has been having some soreness in the mouth for last 1-2 months but it started bothering her more for last one week. It is located in the mucosal surface of her lower jaw. She states that whenever she eats or her tongue accidently  touches that lesion, it causes her a lot of pain. Dentures are very old and her last dental work was about a year ago.  Has never smoked or chewed tobacco in her life. She has been using over-the-counter benzocaine ointment that helps her. Denies any weight loss or systemic symptoms like fever, chills, N/V/D. Also denies any bleeding from that lesion.  She also expresses some concern about her insulin syringes. She supposed to take 5 units with her meal but her insulin syringe does not have the calibration clearly marked as 5 units. She is requesting to get that changed to something that would be easier for her to read.      Review of Systems  Constitutional: Negative for fever, activity change, appetite change, fatigue and unexpected weight change.  HENT: Negative for nosebleeds, congestion, rhinorrhea, sneezing and postnasal drip.        Positive for mouth sore  Respiratory: Negative for apnea, cough, choking, chest tightness, shortness of breath, wheezing and stridor.   Cardiovascular: Negative for chest pain, palpitations and leg swelling.  Gastrointestinal: Negative for nausea, abdominal pain, diarrhea and abdominal distention.  Genitourinary: Negative for dysuria, urgency, frequency and decreased urine volume.  Musculoskeletal: Negative for arthralgias.  Neurological: Negative for light-headedness and headaches.  Hematological: Negative for adenopathy.       Objective:   Physical Exam    Constitutional: She is oriented to person, place, and time. She appears well-developed and well-nourished. No distress.  HENT:  Head: Normocephalic and atraumatic.       A very thin, small 1 mm thick and 3 mm long mucosal lesion noted at her lower jaw, regular surface. No obvious erosions  Eyes: Conjunctivae and EOM are normal. Pupils are equal, round, and reactive to light.  Neck: Normal range of motion. Neck supple. No JVD present. No tracheal deviation present. No thyromegaly present.  Cardiovascular: Normal rate, regular rhythm, normal heart sounds and intact distal pulses.  Exam reveals no gallop and no friction rub.   No murmur heard. Pulmonary/Chest: Effort normal and breath sounds normal. No stridor. No respiratory distress. She has no wheezes. She has no rales. She exhibits no tenderness.  Abdominal: Soft. Bowel sounds are normal. She exhibits no distension and no mass. There is no tenderness. There is no rebound and no guarding.  Musculoskeletal: Normal range of motion. She exhibits no edema and no tenderness.  Lymphadenopathy:    She has no cervical adenopathy.  Neurological: She is alert and oriented to person, place, and time. She displays normal reflexes. No cranial nerve deficit. She exhibits normal muscle tone. Coordination normal.  Skin: Skin is warm. She is not diaphoretic.          Assessment & Plan:

## 2010-08-22 ENCOUNTER — Encounter: Payer: Self-pay | Admitting: Internal Medicine

## 2010-08-22 DIAGNOSIS — K121 Other forms of stomatitis: Secondary | ICD-10-CM

## 2010-08-22 HISTORY — DX: Other forms of stomatitis: K12.1

## 2010-08-22 NOTE — Assessment & Plan Note (Signed)
She has been complaining of some soreness in her mouth for last 1-2 months but it has been bothering her more for one week. On exam she was noticed having a small  mucosal lesion adjacent to her lower jaw bone. Differentials include aphthous ulcer versus mucoceles versus leukoplakia versus erosion from her dentures. On appearance the lesion doesn't look to be malignant. Erosion from the dentures is also less likely as she has been using the same dentures for longtime. Etiology unclear at this point. We'll refer her to ENT for further evaluation. She has been using some over-the-counter benzocaine ointment and was advised to continue using the same for symptomatic relief.

## 2010-08-22 NOTE — Assessment & Plan Note (Signed)
She got her glucometer. Her CBG's were reviewed- blood sugars have been ranging between 100's and 200's. Continue current regimen for now. She was requesting to get her insulin syringes changed as she has difficulty reading the calibration from those. Will call in for smaller syringes.

## 2010-09-04 ENCOUNTER — Ambulatory Visit (HOSPITAL_COMMUNITY)
Admission: RE | Admit: 2010-09-04 | Discharge: 2010-09-04 | Disposition: A | Discharge status: Home / Self Care | Payer: PRIVATE HEALTH INSURANCE | Source: Ambulatory Visit | Attending: Otolaryngology | Admitting: Otolaryngology

## 2010-09-04 ENCOUNTER — Encounter (HOSPITAL_COMMUNITY)
Admission: RE | Admit: 2010-09-04 | Discharge: 2010-09-04 | Disposition: A | Discharge status: Home / Self Care | Payer: PRIVATE HEALTH INSURANCE | Source: Ambulatory Visit | Attending: Otolaryngology | Admitting: Otolaryngology

## 2010-09-04 ENCOUNTER — Other Ambulatory Visit (HOSPITAL_COMMUNITY): Payer: Self-pay | Admitting: Otolaryngology

## 2010-09-04 DIAGNOSIS — C049 Malignant neoplasm of floor of mouth, unspecified: Secondary | ICD-10-CM

## 2010-09-04 DIAGNOSIS — Z01818 Encounter for other preprocedural examination: Secondary | ICD-10-CM | POA: Insufficient documentation

## 2010-09-04 DIAGNOSIS — Z01812 Encounter for preprocedural laboratory examination: Secondary | ICD-10-CM | POA: Insufficient documentation

## 2010-09-04 LAB — CBC
HCT: 38.9 % (ref 36.0–46.0)
MCH: 30.2 pg (ref 26.0–34.0)
MCV: 89 fL (ref 78.0–100.0)
Platelets: 193 10*3/uL (ref 150–400)
RDW: 13 % (ref 11.5–15.5)

## 2010-09-04 LAB — SURGICAL PCR SCREEN: MRSA, PCR: NEGATIVE

## 2010-09-04 LAB — BASIC METABOLIC PANEL
BUN: 23 mg/dL (ref 6–23)
CO2: 34 mEq/L — ABNORMAL HIGH (ref 19–32)
Calcium: 10 mg/dL (ref 8.4–10.5)
Chloride: 98 mEq/L (ref 96–112)
Creatinine, Ser: 1.71 mg/dL — ABNORMAL HIGH (ref 0.50–1.10)
GFR calc Af Amer: 36 mL/min — ABNORMAL LOW (ref >60)

## 2010-09-05 ENCOUNTER — Other Ambulatory Visit: Payer: Self-pay | Admitting: Otolaryngology

## 2010-09-05 ENCOUNTER — Ambulatory Visit (HOSPITAL_COMMUNITY)
Admission: RE | Admit: 2010-09-05 | Discharge: 2010-09-05 | Disposition: A | Discharge status: Home / Self Care | Payer: PRIVATE HEALTH INSURANCE | Source: Ambulatory Visit | Attending: Otolaryngology | Admitting: Otolaryngology

## 2010-09-05 DIAGNOSIS — Z01818 Encounter for other preprocedural examination: Secondary | ICD-10-CM | POA: Insufficient documentation

## 2010-09-05 DIAGNOSIS — Z01812 Encounter for preprocedural laboratory examination: Secondary | ICD-10-CM | POA: Insufficient documentation

## 2010-09-05 DIAGNOSIS — K121 Other forms of stomatitis: Secondary | ICD-10-CM | POA: Insufficient documentation

## 2010-09-05 DIAGNOSIS — Z79899 Other long term (current) drug therapy: Secondary | ICD-10-CM | POA: Insufficient documentation

## 2010-09-05 LAB — GLUCOSE, CAPILLARY

## 2010-09-06 NOTE — Op Note (Signed)
  NAMEGINETTE, [name] NO.:  [id number]  MEDICAL RECORD NO.:  [id number]  LOCATION:  [facility]                         FACILITY:  [facility]  PHYSICIAN:  Ruby Cola, MD      DATE OF BIRTH:  Feb 16, 1940  DATE OF PROCEDURE:  09/05/2010 DATE OF DISCHARGE:                              OPERATIVE REPORT   PREOPERATIVE DIAGNOSIS:  Left floor of mouth lesion.  POSTOPERATIVE DIAGNOSIS:  Benign left floor of mouth lesion.  PROCEDURES PERFORMED:  CPT code 40116 excisional biopsy of left floor of mouth lesion.  SURGEON:  Ruby Cola, MD  ASSISTANT:  None.  COMPLICATIONS:  None.  ANESTHESIA:  General via laryngeal mask airway.  OPERATIVE FINDINGS:  Approximately 1.5 to 2 cm left alveolar ridge fleshy mass that was pedunculated which appeared to be granulation from chronic irritation from the patient's lower denture.  BLOOD LOSS:  Minimal.  SPECIMENS:  Left floor of mouth lesion sent to pathology on frozen section.  This came back as no dysplasia, no malignancy, and copious granulation and chronic inflammation with no malignancy.  DRAINS/DRESSINGS:  None.  OPERATIVE DETAILS:  The patient was correctly identified in the preoperative holding area, brought back to operating room by Anesthesia, placed on the operating table, intubated without incident with laryngeal mask airway.  Once general anesthesia was induced, the left floor of mouth was examined.  The tongue was retracted with a sweetheart retractor and the lip was retracted with a silk stitch and a clamp.  The left floor of mouth lesion appeared to be 1.5 to 2-cm pedunculated left floor of mouth/alveolar ridge mass.  There were several other similar- appearing lesions that were smaller in size.  This appeared to be granulation tissue from chronic irritation from the patient's lower denture.  No evidence of any leukoplakia was seen on the tongue or buccal or floor of mouth mucosa.  An elliptical incision was made  around the base of this lesion with the Bovie and Mayo scissors.  The lesion was removed completely and sent to pathology.  The frozen section came back benign with no evidence of dysplasia, no evidence of malignancy and chronic inflammation and granulation tissue.  Once the benign frozen section results came back, the elliptical incision was closed with interrupted 3-0 Vicryl sutures and the patient was returned to the care of Anesthesia.  She was awakened from anesthesia with laryngeal mask airway was removed.  The patient was taken to postop recovery room in condition.  The patient tolerated the procedure well with no immediate complications.  Dr. Ruby Cola was present and performed the entire procedure.          ______________________________ Ruby Cola, MD     MG/MEDQ  D:  09/05/2010  T:  09/05/2010  Job:  [id number]  Electronically Signed by Ruby Cola MD on 09/06/2010 12:10:21 PM

## 2010-09-24 ENCOUNTER — Other Ambulatory Visit: Payer: Self-pay | Admitting: Internal Medicine

## 2010-09-24 DIAGNOSIS — Z1231 Encounter for screening mammogram for malignant neoplasm of breast: Secondary | ICD-10-CM

## 2010-10-02 ENCOUNTER — Ambulatory Visit (HOSPITAL_COMMUNITY): Payer: PRIVATE HEALTH INSURANCE

## 2010-10-10 LAB — COMPREHENSIVE METABOLIC PANEL
ALT: 20
AST: 19
Alkaline Phosphatase: 60
CO2: 30
Calcium: 9.3
Chloride: 104
GFR calc Af Amer: 46 — ABNORMAL LOW
GFR calc non Af Amer: 38 — ABNORMAL LOW
Glucose, Bld: 166 — ABNORMAL HIGH
Sodium: 138
Total Bilirubin: 0.6

## 2010-10-10 LAB — CARDIAC PANEL(CRET KIN+CKTOT+MB+TROPI)
CK, MB: 1.2
Relative Index: INVALID
Troponin I: <0.01

## 2010-10-10 LAB — DIFFERENTIAL
Basophils Absolute: 0
Basophils Relative: 0
Eosinophils Absolute: 0.2
Eosinophils Relative: 2
Neutrophils Relative %: 84 — ABNORMAL HIGH

## 2010-10-10 LAB — LIPID PANEL
LDL Cholesterol: 67
Total CHOL/HDL Ratio: 5
VLDL: 62 — ABNORMAL HIGH

## 2010-10-10 LAB — POCT CARDIAC MARKERS
CKMB, poc: 1.3
CKMB, poc: 1.3
Myoglobin, poc: 87
Operator id: 284251

## 2010-10-10 LAB — PROTIME-INR
INR: 1
Prothrombin Time: 13.2

## 2010-10-10 LAB — CBC
Hemoglobin: 12.2
MCHC: 34.3
RBC: 3.89
WBC: 9.2

## 2010-10-10 LAB — BASIC METABOLIC PANEL
BUN: 19
CO2: 30
Chloride: 103
Creatinine, Ser: 1.14
GFR calc Af Amer: 47 — ABNORMAL LOW
GFR calc Af Amer: 57 — ABNORMAL LOW
GFR calc non Af Amer: 47 — ABNORMAL LOW
Potassium: 4.2
Potassium: 4.3

## 2010-10-10 LAB — CK TOTAL AND CKMB (NOT AT ARMC)
CK, MB: 1.4
Total CK: 70

## 2010-10-10 LAB — HEMOGLOBIN A1C: Mean Plasma Glucose: 175

## 2010-10-10 LAB — TROPONIN I: Troponin I: <0.01

## 2010-10-15 ENCOUNTER — Ambulatory Visit (HOSPITAL_COMMUNITY)
Admission: RE | Admit: 2010-10-15 | Discharge: 2010-10-15 | Disposition: A | Discharge status: Home / Self Care | Payer: PRIVATE HEALTH INSURANCE | Source: Ambulatory Visit | Attending: Family Medicine | Admitting: Family Medicine

## 2010-10-15 DIAGNOSIS — Z1231 Encounter for screening mammogram for malignant neoplasm of breast: Secondary | ICD-10-CM | POA: Insufficient documentation

## 2010-10-18 LAB — GLUCOSE, CAPILLARY: Glucose-Capillary: 247 mg/dL — ABNORMAL HIGH (ref 70–99)

## 2010-11-14 ENCOUNTER — Encounter: Payer: Self-pay | Admitting: Internal Medicine

## 2010-11-14 ENCOUNTER — Ambulatory Visit (INDEPENDENT_AMBULATORY_CARE_PROVIDER_SITE_OTHER): Payer: PRIVATE HEALTH INSURANCE | Admitting: Internal Medicine

## 2010-11-14 VITALS — BP 108/63 | HR 54 | Temp 97.5°F | Ht 60.0 in | Wt 144.9 lb

## 2010-11-14 DIAGNOSIS — K121 Other forms of stomatitis: Secondary | ICD-10-CM

## 2010-11-14 DIAGNOSIS — I10 Essential (primary) hypertension: Secondary | ICD-10-CM

## 2010-11-14 DIAGNOSIS — M25559 Pain in unspecified hip: Secondary | ICD-10-CM

## 2010-11-14 DIAGNOSIS — Z23 Encounter for immunization: Secondary | ICD-10-CM

## 2010-11-14 DIAGNOSIS — E785 Hyperlipidemia, unspecified: Secondary | ICD-10-CM

## 2010-11-14 DIAGNOSIS — I2510 Atherosclerotic heart disease of native coronary artery without angina pectoris: Secondary | ICD-10-CM

## 2010-11-14 DIAGNOSIS — N259 Disorder resulting from impaired renal tubular function, unspecified: Secondary | ICD-10-CM

## 2010-11-14 DIAGNOSIS — Z0000 Encounter for general adult medical examination without abnormal findings: Secondary | ICD-10-CM

## 2010-11-14 DIAGNOSIS — E119 Type 2 diabetes mellitus without complications: Secondary | ICD-10-CM

## 2010-11-14 DIAGNOSIS — K1370 Unspecified lesions of oral mucosa: Secondary | ICD-10-CM

## 2010-11-14 LAB — POCT GLYCOSYLATED HEMOGLOBIN (HGB A1C): Hemoglobin A1C: 8.2

## 2010-11-14 MED ORDER — ATENOLOL 25 MG PO TABS: 25.0000 mg | ORAL_TABLET | Freq: Every day | ORAL | Status: DC

## 2010-11-14 MED ORDER — POTASSIUM CHLORIDE 10 MEQ PO TBCR: 10.0000 meq | EXTENDED_RELEASE_TABLET | Freq: Every day | ORAL | Status: DC

## 2010-11-14 MED ORDER — TRAMADOL HCL 50 MG PO TABS: 50.0000 mg | ORAL_TABLET | Freq: Four times a day (QID) | ORAL | Status: DC | PRN

## 2010-11-14 NOTE — Assessment & Plan Note (Addendum)
Lab Results  Component Value Date   NA 140 09/04/2010   K 3.9 09/04/2010   CL 98 09/04/2010   CO2 34* 09/04/2010   BUN 23 09/04/2010   CREATININE 1.71* 09/04/2010   CREATININE 1.55* 07/18/2010  Last BMET checked 10/16/2010 at Dr. Merrilee Jansky office  Na 142 K 4.3 Chloride 103 Glucose 103 BUN 28 Cr 1.50  BP Readings from Last 3 Encounters:  11/14/10 108/63  08/21/10 119/52  07/18/10 113/53    Assessment: Hypertension control:  controlled  Progress toward goals:  at goal Barriers to meeting goals:  no barriers identified  Plan: Hypertension treatment:  continue current medications Will request records of recent labs checked at Dr. Merrilee Jansky office.

## 2010-11-14 NOTE — Assessment & Plan Note (Signed)
Patient is on crestor for elevated triglycerides only, as last LDL was 47 with total cholesterol being normal. Patient may benefit from triglyceride lowering agent such as Gemfibrozil. Will discuss discontinuing this medication and starting a triglyceride lowering agent at next visit.

## 2010-11-14 NOTE — Assessment & Plan Note (Addendum)
Lab Results  Component Value Date   HGBA1C 8.2 11/14/2010   HGBA1C  Value: 8.9 (NOTE)                                                                       According to the ADA Clinical Practice Recommendations for 2011, when HbA1c is used as a screening test:   >=6.5%   Diagnostic of Diabetes Mellitus           (if abnormal result  is confirmed)  5.7-6.4%   Increased risk of developing Diabetes Mellitus  References:Diagnosis and Classification of Diabetes Mellitus,Diabetes [id number] 1):S62-S69 and Standards of Medical Care in         Diabetes - 2011,Diabetes [id number]  (Suppl 1):S11-S61.* 04/22/2010   CREATININE 1.71* 09/04/2010   CREATININE 1.55* 07/18/2010   MICROALBUR 1.98* 06/13/2009   MICRALBCREAT 81.1* 06/13/2009   CHOL  Value: 132        ATP III CLASSIFICATION:  <200     mg/dL   Desirable  200-239  mg/dL   Borderline High  >=240    mg/dL   High        04/23/2010   HDL 35* 04/23/2010   TRIG 249* 04/23/2010    Last eye exam and foot exam: No results found for this basename: HMDIABEYEEXA, HMDIABFOOTEX    Assessment: Diabetes control: controlled Progress toward goals: slightly deteriorated from 7.6 in July 2012 to 8.2 today. Patient attributes this to increased consumption of ice cream. Barriers to meeting goals: no barriers identified  Plan: Diabetes treatment: continue current medications Advised patient to increase SSI when sugars are above 200.  Refer to: none Instruction/counseling given: reminded to bring blood glucose meter & log to each visit, reminded to bring medications to each visit and discussed foot care

## 2010-11-14 NOTE — Assessment & Plan Note (Addendum)
CKD Stage 3 2/2 longstanding DM. Cr checked at Dr. Merrilee Jansky office 10/16/2010 was 1.50 which is patient's baseline. Continue ARB at current dose.

## 2010-11-14 NOTE — Assessment & Plan Note (Signed)
No recent events. Stress test in May 2012 normal. Normal Echo. Followed by Dr. Doylene Canard of cardiology. Continue ASA, statin, and beta blocker. Control risk factors such as HTN and DM.

## 2010-11-14 NOTE — Progress Notes (Signed)
  Subjective:    Patient ID: [name], female    DOB: [birth year], 70 y.o.   MRN: [id number]  HPI [name] is a 70 year old woman with pmh significant for HTN, HLD, DM, CAD, and recent mouth ulcer that was removed by ENT (September 2012) who presents for general check-up.  1.) DM - Checks CBGs 3 times daily. AM sugars run between 80s-180s, while lunch time sugars run between 200-300 and pm sugars run between 80-200. Patient states she is compliant with SSI and takes anywhere between 5-8 units before meal. Denies dizziness, or lightheadedness. Other pertinent negatives include nausea, vomiting, polyuria, or polydipsia.   2.) CAD - no recent chest pain, takes all medications as directed.   3.) Mouth ulcer - s/p removal by ENT 09/2010. Pt reports it was not cancerous.   No other complaints or concerns today.   Review of Systems  Respiratory: Negative for cough, chest tightness and shortness of breath.   Cardiovascular: Negative for chest pain and leg swelling.  Genitourinary: Negative for dysuria and frequency.       Objective:   Physical Exam  Constitutional: She is oriented to person, place, and time. She appears well-developed.  HENT:  Head: Normocephalic and atraumatic.  Eyes: Pupils are equal, round, and reactive to light.  Neck: Normal range of motion. Neck supple.  Cardiovascular: Normal rate, regular rhythm, normal heart sounds and intact distal pulses.   Pulmonary/Chest: Effort normal and breath sounds normal.  Abdominal: Soft. Bowel sounds are normal.  Musculoskeletal: Normal range of motion.  Neurological: She is alert and oriented to person, place, and time.  Psychiatric: She has a normal mood and affect.          Assessment & Plan:

## 2010-11-14 NOTE — Patient Instructions (Signed)
Please follow up in 3 months. Please continue to take all medications as prescribed. Please increase the amount of insulin you inject before meals to 5-8 units if blood sugars exceed 200.

## 2010-11-20 ENCOUNTER — Encounter: Payer: Self-pay | Admitting: Dietician

## 2011-01-17 ENCOUNTER — Encounter: Payer: Self-pay | Admitting: Dietician

## 2011-02-14 ENCOUNTER — Encounter: Payer: Self-pay | Admitting: Internal Medicine

## 2011-02-14 ENCOUNTER — Ambulatory Visit (INDEPENDENT_AMBULATORY_CARE_PROVIDER_SITE_OTHER): Payer: Medicare Other | Admitting: Internal Medicine

## 2011-02-14 ENCOUNTER — Other Ambulatory Visit: Payer: Self-pay | Admitting: *Deleted

## 2011-02-14 VITALS — BP 140/63 | HR 58 | Temp 97.1°F | Ht 62.0 in | Wt 145.5 lb

## 2011-02-14 DIAGNOSIS — I10 Essential (primary) hypertension: Secondary | ICD-10-CM

## 2011-02-14 DIAGNOSIS — N259 Disorder resulting from impaired renal tubular function, unspecified: Secondary | ICD-10-CM

## 2011-02-14 DIAGNOSIS — E785 Hyperlipidemia, unspecified: Secondary | ICD-10-CM

## 2011-02-14 DIAGNOSIS — M25559 Pain in unspecified hip: Secondary | ICD-10-CM

## 2011-02-14 DIAGNOSIS — E119 Type 2 diabetes mellitus without complications: Secondary | ICD-10-CM

## 2011-02-14 DIAGNOSIS — I2510 Atherosclerotic heart disease of native coronary artery without angina pectoris: Secondary | ICD-10-CM

## 2011-02-14 DIAGNOSIS — I5022 Chronic systolic (congestive) heart failure: Secondary | ICD-10-CM

## 2011-02-14 DIAGNOSIS — I739 Peripheral vascular disease, unspecified: Secondary | ICD-10-CM

## 2011-02-14 DIAGNOSIS — Z0000 Encounter for general adult medical examination without abnormal findings: Secondary | ICD-10-CM

## 2011-02-14 LAB — BASIC METABOLIC PANEL
BUN: 26 mg/dL — ABNORMAL HIGH (ref 6–23)
Calcium: 9.5 mg/dL (ref 8.4–10.5)
Glucose, Bld: 207 mg/dL — ABNORMAL HIGH (ref 70–99)
Potassium: 4.3 mEq/L (ref 3.5–5.3)
Sodium: 142 mEq/L (ref 135–145)

## 2011-02-14 LAB — POCT GLYCOSYLATED HEMOGLOBIN (HGB A1C): Hemoglobin A1C: 8.2

## 2011-02-14 LAB — GLUCOSE, CAPILLARY: Glucose-Capillary: 244 mg/dL — ABNORMAL HIGH (ref 70–99)

## 2011-02-14 MED ORDER — SITAGLIPTIN PHOSPHATE 50 MG PO TABS: 50.0000 mg | ORAL_TABLET | Freq: Every day | ORAL | Status: DC

## 2011-02-14 MED ORDER — ATENOLOL 25 MG PO TABS: 25.0000 mg | ORAL_TABLET | Freq: Every day | ORAL | Status: DC

## 2011-02-14 MED ORDER — INSULIN REGULAR HUMAN 100 UNIT/ML IJ SOLN: 3.0000 [IU] | Freq: Three times a day (TID) | INTRAMUSCULAR | Status: DC

## 2011-02-14 MED ORDER — "INSULIN SYRINGE-NEEDLE U-100 31G X 5/16"" 0.3 ML MISC": Status: DC

## 2011-02-14 MED ORDER — ASPIRIN 325 MG PO TBEC: 325.0000 mg | DELAYED_RELEASE_TABLET | Freq: Every day | ORAL | Status: DC

## 2011-02-14 MED ORDER — INSULIN PEN NEEDLE 32G X 4 MM MISC: Status: DC

## 2011-02-14 MED ORDER — CALCIUM-VITAMIN D 500-200 MG-UNIT PO TABS: 1.0000 | ORAL_TABLET | Freq: Two times a day (BID) | ORAL | Status: DC

## 2011-02-14 MED ORDER — ESOMEPRAZOLE MAGNESIUM 40 MG PO CPDR: 40.0000 mg | DELAYED_RELEASE_CAPSULE | Freq: Every day | ORAL | Status: DC

## 2011-02-14 MED ORDER — TRAMADOL HCL 50 MG PO TABS: 50.0000 mg | ORAL_TABLET | Freq: Four times a day (QID) | ORAL | Status: DC | PRN

## 2011-02-14 MED ORDER — ROSUVASTATIN CALCIUM 40 MG PO TABS: 40.0000 mg | ORAL_TABLET | Freq: Every day | ORAL | Status: DC

## 2011-02-14 MED ORDER — GLIPIZIDE 10 MG PO TABS: 10.0000 mg | ORAL_TABLET | Freq: Two times a day (BID) | ORAL | Status: DC

## 2011-02-14 MED ORDER — FUROSEMIDE 40 MG PO TABS: 40.0000 mg | ORAL_TABLET | Freq: Every day | ORAL | Status: DC

## 2011-02-14 MED ORDER — VALSARTAN 320 MG PO TABS: 320.0000 mg | ORAL_TABLET | Freq: Every day | ORAL | Status: DC

## 2011-02-14 NOTE — Assessment & Plan Note (Signed)
Chronic kidney disease stage III secondary to long-standing diabetes. Creatinine is currently at baseline which is 1.50 and will check Bmet today and continue ARB at current dose.

## 2011-02-14 NOTE — Assessment & Plan Note (Signed)
Previously followed by vascular surgery. 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 Patient is relatively asymptomatic with intermittent claudication symptoms only. We'll continue to follow clinically.

## 2011-02-14 NOTE — Progress Notes (Signed)
Subjective:     Patient ID: [name], female   DOB: 30-Aug-1940, 71 y.o.   MRN: [id number]  HPI [name] is a pleasant 71 year old woman with past medical history significant for type 2 diabetes, hypertension, hyperlipidemia, coronary artery disease and peripheral vascular disease who presents today for a general followup.  1.) DM - patient has brought her glucose meter with her and reports checking her blood sugars 3-4 times a day. Patient reports that her blood sugars in the a.m. range between 80-170. Her lunch and p.m. sugars have any more wider range anywhere between 152 320. Patient reports that she does take sliding scale insulin however is not consistent with that and she adds that her diet is poor.   2.) CAD - patient denies any chest pain, shortness of breath, and reports taking her medications as instructed. She was last seen by Dr. Doylene Canard in late 2012. No further recommendations were made at that time.   No other complaints or concerns today.  Review of Systems  All other systems reviewed and are negative.       Objective:   Physical Exam  Constitutional: She appears well-developed.  HENT:  Head: Normocephalic.  Eyes: Pupils are equal, round, and reactive to light.  Neck: Normal range of motion. Neck supple. No JVD present.  Cardiovascular: Regular rhythm and normal heart sounds.  Exam reveals no gallop and no friction rub.   No murmur heard.      Bradycardic  Pulmonary/Chest: Effort normal and breath sounds normal.  Abdominal: Soft. Bowel sounds are normal.  Musculoskeletal: Normal range of motion.  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 Dry, scaly, pink in color lesion  Psychiatric: She has a normal mood and affect.

## 2011-02-14 NOTE — Assessment & Plan Note (Signed)
Lab Results  Component Value Date   CHOL  Value: 132        ATP III CLASSIFICATION:  <200     mg/dL   Desirable  200-239  mg/dL   Borderline High  >=240    mg/dL   High        04/23/2010   HDL 35* 04/23/2010   LDLCALC  Value: 47        Total Cholesterol/HDL:CHD Risk Coronary Heart Disease Risk Table                     Men   Women  1/2 Average Risk   3.4   3.3  Average Risk       5.0   4.4  2 X Average Risk   9.6   7.1  3 X Average Risk  23.4   11.0        Use the calculated Patient Ratio above and the CHD Risk Table to determine the patient's CHD Risk.        ATP III CLASSIFICATION (LDL):  <100     mg/dL   Optimal  100-129  mg/dL   Near or Above                    Optimal  130-159  mg/dL   Borderline  160-189  mg/dL   High  >190     mg/dL   Very High 04/23/2010   TRIG 249* 04/23/2010   CHOLHDL 3.8 04/23/2010   Triglycerides are slightly elevated. HDL and LDL within normal range. Patient is on Crestor 40 mg. No side effects reported from statin. Will check lipid panel at next visit.

## 2011-02-14 NOTE — Patient Instructions (Signed)
Please follow up in 3 months. Please continue to taking sliding scale insulin 3-10 units before meals. Please substitute frozen yogurt for ice cream. Please try to go to the swimming pool.

## 2011-02-14 NOTE — Assessment & Plan Note (Addendum)
Last echo done in 2010 revealing an ejection fraction of 30-35%. Myoview stress test done in April 2012 revealed no wall motion abnormalities as well as an ejection fraction of 77%. Patient is on ARB, beta blocker, Lasix and aspirin. She denies any heart failure symptoms at this time. Patient denies shortness of breath, cough, PND, orthopnea. Physical exam was also unremarkable. We'll continue current medical management. A followup echocardiogram is not necessary at this time. Patient will now follow up with our cardiology as needed as her insurance will not cover Dr. Doylene Canard.

## 2011-02-14 NOTE — Telephone Encounter (Signed)
Needs Klor Con 10 meq SR # 90.  Pt has requested the Lantus Pen needles takes 43 units.  Nitroglycerin sublingual 0.4 mg  *255.  Pt would like to get 90 day supplies on all of her meds.

## 2011-02-14 NOTE — Assessment & Plan Note (Signed)
Asymptomatic in terms of chest pain. Has not required nitroglycerin. Previously followed by Dr. Wyline Beady cardiology. Patient will be referred to [facility] cardiology as her insurance will not cover her previous cardiologist. Currently does not need an appointment with cardiology. Stress test May 2012 within normal limits. Continue aspirin, statin, beta blocker and ACE inhibitor.

## 2011-02-14 NOTE — Assessment & Plan Note (Signed)
Lab Results  Component Value Date   NA 140 09/04/2010   K 3.9 09/04/2010   CL 98 09/04/2010   CO2 34* 09/04/2010   BUN 23 09/04/2010   CREATININE 1.71* 09/04/2010   CREATININE 1.55* 07/18/2010    BP Readings from Last 3 Encounters:  02/14/11 140/63  11/14/10 108/63  08/21/10 119/52    Assessment: Hypertension control:  controlled  Progress toward goals:  at goal Barriers to meeting goals:  no barriers identified  Plan: Hypertension treatment:  continue current medications Check BMET today

## 2011-02-14 NOTE — Assessment & Plan Note (Signed)
Lab Results  Component Value Date   HGBA1C 8.2 02/14/2011   HGBA1C  Value: 8.9 (NOTE)                                                                       According to the ADA Clinical Practice Recommendations for 2011, when HbA1c is used as a screening test:   >=6.5%   Diagnostic of Diabetes Mellitus           (if abnormal result  is confirmed)  5.7-6.4%   Increased risk of developing Diabetes Mellitus  References:Diagnosis and Classification of Diabetes Mellitus,Diabetes [id number] 1):S62-S69 and Standards of Medical Care in         Diabetes - 2011,Diabetes [id number]  (Suppl 1):S11-S61.* 04/22/2010   CREATININE 1.71* 09/04/2010   CREATININE 1.55* 07/18/2010   MICROALBUR 1.98* 06/13/2009   MICRALBCREAT 81.1* 06/13/2009   CHOL  Value: 132        ATP III CLASSIFICATION:  <200     mg/dL   Desirable  200-239  mg/dL   Borderline High  >=240    mg/dL   High        04/23/2010   HDL 35* 04/23/2010   TRIG 249* 04/23/2010    Last eye exam and foot exam: No results found for this basename: HMDIABEYEEXA, HMDIABFOOTEX  Eye exam due later this year.  Assessment: Diabetes control: not controlled Progress toward goals: unchanged Barriers to meeting goals: lack of understanding of disease management  Plan: Diabetes treatment: continue current medications Sliding scale insulin was explained in detail.  Refer to: none Instruction/counseling given: reminded to bring blood glucose meter & log to each visit and reminded to bring medications to each visit Diet was discussed, as patient has ice cream 3 times a week with whipped cream, chocolate syrup.

## 2011-02-20 ENCOUNTER — Other Ambulatory Visit: Payer: Self-pay | Admitting: *Deleted

## 2011-02-20 DIAGNOSIS — E119 Type 2 diabetes mellitus without complications: Secondary | ICD-10-CM

## 2011-02-20 NOTE — Telephone Encounter (Signed)
Pt states she uses the lantus pens, please change to pens

## 2011-02-21 MED ORDER — "INSULIN SYRINGE-NEEDLE U-100 31G X 5/16"" 0.5 ML MISC": Status: DC

## 2011-02-21 MED ORDER — INSULIN PEN NEEDLE 32G X 4 MM MISC: Status: DC

## 2011-02-21 MED ORDER — INSULIN GLARGINE 100 UNIT/ML ~~LOC~~ SOLN: 43.0000 [IU] | Freq: Every day | SUBCUTANEOUS | Status: DC

## 2011-02-24 ENCOUNTER — Telehealth: Payer: Self-pay | Admitting: *Deleted

## 2011-02-24 NOTE — Telephone Encounter (Signed)
Pt calls for a refill on K+, when attempting to reorder i rec'd a message that a new order for K+ will need to be entered, will you do so and [facility]

## 2011-02-25 MED ORDER — POTASSIUM CHLORIDE ER 10 MEQ PO TBCR: 10.0000 meq | EXTENDED_RELEASE_TABLET | Freq: Two times a day (BID) | ORAL | Status: DC

## 2011-03-05 ENCOUNTER — Telehealth: Payer: Self-pay | Admitting: *Deleted

## 2011-03-05 NOTE — Telephone Encounter (Signed)
Pt calls and states insurance carrier wants her to be changed from [place] to something cheaper, is there something?

## 2011-03-06 NOTE — Telephone Encounter (Signed)
Spoke w/ pt, she is agreeable w/ stopping [place]

## 2011-03-07 ENCOUNTER — Telehealth: Payer: Self-pay | Admitting: *Deleted

## 2011-03-07 ENCOUNTER — Other Ambulatory Visit: Payer: Self-pay | Admitting: *Deleted

## 2011-03-07 MED ORDER — POTASSIUM CHLORIDE ER 10 MEQ PO TBCR: 10.0000 meq | EXTENDED_RELEASE_TABLET | Freq: Two times a day (BID) | ORAL | Status: DC

## 2011-03-07 MED ORDER — GLUCOSE BLOOD VI STRP: ORAL_STRIP | Status: DC

## 2011-03-07 NOTE — Telephone Encounter (Signed)
Crestor,Glipizide, Diovan, Lasix,and KDur rxs called to [facility] per pt's request. K-Dur 2meq  rx was 1 tab twice daily Qty# 40 x 6 refills - is this correct Dr. Ina Homes?  Qty #40

## 2011-03-07 NOTE — Telephone Encounter (Signed)
K-Dur #60 tabs called to [facility].

## 2011-03-07 NOTE — Telephone Encounter (Signed)
Needs 3 month supply

## 2011-03-10 NOTE — Telephone Encounter (Signed)
Glucose blood test strips rx called to [facility].

## 2011-04-18 ENCOUNTER — Other Ambulatory Visit: Payer: Self-pay | Admitting: *Deleted

## 2011-04-18 DIAGNOSIS — E119 Type 2 diabetes mellitus without complications: Secondary | ICD-10-CM

## 2011-04-18 NOTE — Telephone Encounter (Signed)
Needs 3 month supply

## 2011-04-21 MED ORDER — "PEN NEEDLES 3/16"" 31G X 5 MM MISC": 1.0000 | Freq: Three times a day (TID) | Status: DC

## 2011-04-21 MED ORDER — INSULIN GLARGINE 100 UNIT/ML ~~LOC~~ SOLN: 43.0000 [IU] | Freq: Every day | SUBCUTANEOUS | Status: DC

## 2011-04-21 MED ORDER — INSULIN PEN NEEDLE 32G X 4 MM MISC: Status: DC

## 2011-04-21 MED ORDER — "INSULIN SYRINGE-NEEDLE U-100 31G X 5/16"" 0.5 ML MISC": Status: DC

## 2011-04-21 MED ORDER — INSULIN REGULAR HUMAN 100 UNIT/ML IJ SOLN: 3.0000 [IU] | Freq: Three times a day (TID) | INTRAMUSCULAR | Status: DC

## 2011-04-21 MED ORDER — POTASSIUM CHLORIDE ER 10 MEQ PO TBCR: 10.0000 meq | EXTENDED_RELEASE_TABLET | Freq: Two times a day (BID) | ORAL | Status: DC

## 2011-04-21 MED ORDER — "INSULIN SYRINGE-NEEDLE U-100 31G X 5/16"" 0.3 ML MISC": Status: DC

## 2011-04-24 ENCOUNTER — Other Ambulatory Visit: Payer: Self-pay | Admitting: *Deleted

## 2011-04-24 MED ORDER — INSULIN PEN NEEDLE 32G X 4 MM MISC: Status: DC

## 2011-04-24 MED ORDER — INSULIN GLARGINE 100 UNIT/ML ~~LOC~~ SOLN: SUBCUTANEOUS | Status: DC

## 2011-04-24 NOTE — Telephone Encounter (Signed)
Call from [name]. - pt requesting Lantus Solostar Pens instead of vials also requesting nano pen needles with 90 day Supply.   Thanks

## 2011-05-22 ENCOUNTER — Ambulatory Visit (INDEPENDENT_AMBULATORY_CARE_PROVIDER_SITE_OTHER): Payer: Medicare Other | Admitting: Internal Medicine

## 2011-05-22 ENCOUNTER — Encounter: Payer: Self-pay | Admitting: Internal Medicine

## 2011-05-22 VITALS — BP 144/70 | HR 51 | Temp 97.0°F | Ht 62.0 in | Wt 144.1 lb

## 2011-05-22 DIAGNOSIS — Z79899 Other long term (current) drug therapy: Secondary | ICD-10-CM

## 2011-05-22 DIAGNOSIS — E785 Hyperlipidemia, unspecified: Secondary | ICD-10-CM

## 2011-05-22 DIAGNOSIS — E119 Type 2 diabetes mellitus without complications: Secondary | ICD-10-CM

## 2011-05-22 DIAGNOSIS — I10 Essential (primary) hypertension: Secondary | ICD-10-CM

## 2011-05-22 DIAGNOSIS — L408 Other psoriasis: Secondary | ICD-10-CM

## 2011-05-22 LAB — COMPLETE METABOLIC PANEL WITH GFR
ALT: 12 U/L (ref 0–35)
AST: 17 U/L (ref 0–37)
Alkaline Phosphatase: 71 U/L (ref 39–117)
Creat: 1.56 mg/dL — ABNORMAL HIGH (ref 0.50–1.10)
GFR, Est African American: 38 mL/min — ABNORMAL LOW
Sodium: 142 mEq/L (ref 135–145)
Total Bilirubin: 0.5 mg/dL (ref 0.3–1.2)
Total Protein: 6.7 g/dL (ref 6.0–8.3)

## 2011-05-22 LAB — LIPID PANEL
HDL: 42 mg/dL (ref >39)
LDL Cholesterol: 73 mg/dL (ref 0–99)
Total CHOL/HDL Ratio: 3.7 Ratio
Triglycerides: 210 mg/dL — ABNORMAL HIGH (ref <150)
VLDL: 42 mg/dL — ABNORMAL HIGH (ref 0–40)

## 2011-05-22 LAB — POCT GLYCOSYLATED HEMOGLOBIN (HGB A1C): Hemoglobin A1C: 8.5

## 2011-05-22 LAB — GLUCOSE, CAPILLARY: Glucose-Capillary: 167 mg/dL — ABNORMAL HIGH (ref 70–99)

## 2011-05-22 NOTE — Assessment & Plan Note (Signed)
Lab Results  Component Value Date   NA 142 02/14/2011   K 4.3 02/14/2011   CL 100 02/14/2011   CO2 32 02/14/2011   BUN 26* 02/14/2011   CREATININE 1.59* 02/14/2011   CREATININE 1.71* 09/04/2010    BP Readings from Last 3 Encounters:  05/22/11 144/70  02/14/11 140/63  11/14/10 108/63    Assessment: Hypertension control:  mildly elevated  Progress toward goals:  at goal Barriers to meeting goals:  no barriers identified  Plan: Hypertension treatment:  continue current medications

## 2011-05-22 NOTE — Assessment & Plan Note (Signed)
Lab Results  Component Value Date   CHOL  Value: 132        ATP III CLASSIFICATION:  <200     mg/dL   Desirable  200-239  mg/dL   Borderline High  >=240    mg/dL   High        04/23/2010   HDL 35* 04/23/2010   LDLCALC  Value: 47        Total Cholesterol/HDL:CHD Risk Coronary Heart Disease Risk Table                     Men   Women  1/2 Average Risk   3.4   3.3  Average Risk       5.0   4.4  2 X Average Risk   9.6   7.1  3 X Average Risk  23.4   11.0        Use the calculated Patient Ratio above and the CHD Risk Table to determine the patient's CHD Risk.        ATP III CLASSIFICATION (LDL):  <100     mg/dL   Optimal  100-129  mg/dL   Near or Above                    Optimal  130-159  mg/dL   Borderline  160-189  mg/dL   High  >190     mg/dL   Very High 04/23/2010   TRIG 249* 04/23/2010   CHOLHDL 3.8 04/23/2010   Lipids well controlled. Will check today along with LFTs.

## 2011-05-22 NOTE — Assessment & Plan Note (Signed)
Lab Results  Component Value Date   HGBA1C 8.5 05/22/2011   HGBA1C  Value: 8.9 (NOTE)                                                                       According to the ADA Clinical Practice Recommendations for 2011, when HbA1c is used as a screening test:   >=6.5%   Diagnostic of Diabetes Mellitus           (if abnormal result  is confirmed)  5.7-6.4%   Increased risk of developing Diabetes Mellitus  References:Diagnosis and Classification of Diabetes Mellitus,Diabetes [id number] 1):S62-S69 and Standards of Medical Care in         Diabetes - 2011,Diabetes [id number]  (Suppl 1):S11-S61.* 04/22/2010   CREATININE 1.59* 02/14/2011   CREATININE 1.71* 09/04/2010   MICROALBUR 1.98* 06/13/2009   MICRALBCREAT 81.1* 06/13/2009   CHOL  Value: 132        ATP III CLASSIFICATION:  <200     mg/dL   Desirable  200-239  mg/dL   Borderline High  >=240    mg/dL   High        04/23/2010   HDL 35* 04/23/2010   TRIG 249* 04/23/2010    Last eye exam and foot exam: No results found for this basename: HMDIABEYEEXA, HMDIABFOOTEX    Assessment: Diabetes control: not controlled Progress toward goals: deteriorated Barriers to meeting goals: adverse effects of medications  Plan: Diabetes treatment: continue current medications Patient did not bring meter, therefore hard to adjust dose of insulin. Emphasized compliance with insulin and dietary measures. Patient loves ice cream, and cakes.  Refer to: none Instruction/counseling given: reminded to bring blood glucose meter & log to each visit, reminded to bring medications to each visit and discussed foot care

## 2011-05-22 NOTE — Progress Notes (Signed)
Patient ID: [name], female   DOB: 1940/05/24, 71 y.o.   MRN: [id number] Subjective:     Patient ID: [name], female   DOB: [birth year], 71 y.o.   MRN: [id number]  HPI [name] is a pleasant 71 year old woman with past medical history significant for type 2 diabetes, hypertension, hyperlipidemia, coronary artery disease and peripheral vascular disease who presents today for a general followup.  1.) DM - patient did not bring meter today. Patient reports that her blood sugars in the a.m. range between 80-170. Her lunch and p.m. sugars have any more wider range anywhere between 150-300s. Patient reports that she does take sliding scale insulin however is not consistent with that and she adds that her diet is poor, ie eating large bowls of ice cream and cake.   2.) CAD - patient denies any chest pain, shortness of breath, and reports taking her medications as instructed. She was last seen by Dr. Doylene Canard in late 2012. No further recommendations were made at that time.   No other complaints or concerns today.  Review of Systems  All other systems reviewed and are negative.       Objective:   Physical Exam  Constitutional: She appears well-developed.  HENT:  Head: Normocephalic.  Eyes: Pupils are equal, round, and reactive to light.  Neck: Normal range of motion. Neck supple. No JVD present.  Cardiovascular: Regular rhythm and normal heart sounds.  Exam reveals no gallop and no friction rub.   No murmur heard.      Bradycardic  Pulmonary/Chest: Effort normal and breath sounds normal.  Abdominal: Soft. Bowel sounds are normal.  Musculoskeletal: Normal range of motion.  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 Dry, scaly, pink in color lesion  Psychiatric: She has a normal mood and affect.

## 2011-05-22 NOTE — Assessment & Plan Note (Signed)
Consider rheum referral if skin lesions are not tolerable. Am hesitant to start methotrexate or monoclonal ab in this patient with multiple comorbidities and would feel more comfortable if she was seen by rheum first.

## 2011-05-22 NOTE — Patient Instructions (Signed)
Please follow up in 3 months with your new primary care physician. Please take insulin even throughout the day.  Please take all medications as prescribed.

## 2011-05-23 LAB — MICROALBUMIN / CREATININE URINE RATIO
Creatinine, Urine: 15 mg/dL
Microalb Creat Ratio: 126 mg/g — ABNORMAL HIGH (ref 0.0–30.0)

## 2011-08-28 ENCOUNTER — Other Ambulatory Visit: Payer: Self-pay | Admitting: *Deleted

## 2011-08-29 MED ORDER — INSULIN REGULAR HUMAN 100 UNIT/ML IJ SOLN: 3.0000 [IU] | Freq: Three times a day (TID) | INTRAMUSCULAR | Status: DC

## 2011-09-12 ENCOUNTER — Other Ambulatory Visit: Payer: Self-pay | Admitting: *Deleted

## 2011-09-12 DIAGNOSIS — E119 Type 2 diabetes mellitus without complications: Secondary | ICD-10-CM

## 2011-09-12 NOTE — Telephone Encounter (Signed)
Pt request lancets, she does not have this listed in [facility] as she has been using ones that were made available to her.  She needs a device and lancets.   Pt checks  cbg 3 times a day.

## 2011-09-16 ENCOUNTER — Encounter: Payer: Self-pay | Admitting: Internal Medicine

## 2011-09-16 MED ORDER — ACCU-CHEK MULTICLIX LANCETS MISC: Status: DC

## 2011-10-09 ENCOUNTER — Encounter: Payer: Self-pay | Admitting: Internal Medicine

## 2011-10-09 ENCOUNTER — Ambulatory Visit (INDEPENDENT_AMBULATORY_CARE_PROVIDER_SITE_OTHER): Payer: Medicare Other | Admitting: Internal Medicine

## 2011-10-09 VITALS — BP 135/64 | HR 48 | Temp 97.7°F | Ht 62.0 in | Wt 141.7 lb

## 2011-10-09 DIAGNOSIS — I2510 Atherosclerotic heart disease of native coronary artery without angina pectoris: Secondary | ICD-10-CM

## 2011-10-09 DIAGNOSIS — N189 Chronic kidney disease, unspecified: Secondary | ICD-10-CM

## 2011-10-09 DIAGNOSIS — E119 Type 2 diabetes mellitus without complications: Secondary | ICD-10-CM

## 2011-10-09 DIAGNOSIS — I10 Essential (primary) hypertension: Secondary | ICD-10-CM

## 2011-10-09 DIAGNOSIS — I5022 Chronic systolic (congestive) heart failure: Secondary | ICD-10-CM

## 2011-10-09 DIAGNOSIS — Z23 Encounter for immunization: Secondary | ICD-10-CM

## 2011-10-09 DIAGNOSIS — Z79899 Other long term (current) drug therapy: Secondary | ICD-10-CM

## 2011-10-09 DIAGNOSIS — Z872 Personal history of diseases of the skin and subcutaneous tissue: Secondary | ICD-10-CM

## 2011-10-09 LAB — GLUCOSE, CAPILLARY: Glucose-Capillary: 175 mg/dL — ABNORMAL HIGH (ref 70–99)

## 2011-10-09 LAB — POCT GLYCOSYLATED HEMOGLOBIN (HGB A1C): Hemoglobin A1C: 8.2

## 2011-10-09 MED ORDER — INSULIN GLARGINE 100 UNIT/ML ~~LOC~~ SOLN: SUBCUTANEOUS | Status: DC

## 2011-10-09 MED ORDER — INSULIN NPH ISOPHANE & REGULAR (70-30) 100 UNIT/ML ~~LOC~~ SUSP: SUBCUTANEOUS | Status: DC

## 2011-10-09 MED ORDER — "INSULIN SYRINGE-NEEDLE U-100 31G X 5/16"" 0.3 ML MISC": 4.0000 [IU] | Freq: Four times a day (QID) | Status: DC

## 2011-10-09 MED ORDER — DESONIDE 0.05 % EX CREA: TOPICAL_CREAM | Freq: Two times a day (BID) | CUTANEOUS | Status: DC

## 2011-10-09 MED ORDER — FUROSEMIDE 40 MG PO TABS: 40.0000 mg | ORAL_TABLET | Freq: Every day | ORAL | Status: DC

## 2011-10-09 MED ORDER — CLOBETASOL PROPIONATE 0.05 % EX SOLN: 1.0000 "application " | Freq: Two times a day (BID) | CUTANEOUS | Status: DC

## 2011-10-09 MED ORDER — POTASSIUM CHLORIDE ER 10 MEQ PO TBCR: 10.0000 meq | EXTENDED_RELEASE_TABLET | Freq: Two times a day (BID) | ORAL | Status: DC

## 2011-10-09 MED ORDER — GLUCOSE BLOOD VI STRP: ORAL_STRIP | Status: DC

## 2011-10-09 MED ORDER — ASPIRIN EC 81 MG PO TBEC: 81.0000 mg | DELAYED_RELEASE_TABLET | Freq: Every day | ORAL | Status: DC

## 2011-10-09 NOTE — Patient Instructions (Signed)
Chronic Renal Insufficiency Chronic renal insufficiency (also called kidney failure) occurs when there is kidney damage done. The damage prevents the kidneys from working like they should.  The kidneys do many important things. They:  Filter waste out of the blood.   Regulate the amount of water and various salts in the blood stream.   Produce chemicals that:   Prompt the bone marrow to make red blood cells.   Regulate blood pressure.   Keep calcium in balance throughout the bones and the body.  When the kidneys are damaged, they can no longer filter waste products out of the blood. These substances build up in the blood, causing illness.  CAUSES   Diabetes.   High blood pressure.   Glomerular diseases: Conditions that damage the tiny blood vessels (glomeruli) within the kidneys, such as:   Membranous nephropathy.   IgA nephropathy.   Focal segmental glomerulosclerosis.   Poisons (such as overdoses or misuse of acetaminophen or NSAIDS, or exposure to other toxic substances).   Kidney injuries.   Kidney cancer or cancer that spreads to the kidney.   Medications such as NSAIDs. These problems are rare.   Kidney stones.   Alport disease.   Polycystic kidneys.  SYMPTOMS  Most people do not notice symptoms of kidney failure until their kidney function drops below about 30-40% of normal. Symptoms can include:  Weakness.   Tiredness.   Frequent urination.   Intense need to urinate.   Excess bruising.   Low urine production.   Blood in the urine.   Pain in the kidney area.   Feeling sick to your stomach (nausea).   Vomiting.   Unusual bleeding.   Numbness in hands and feet.   Swelling in legs, arms and face.   Confusion.  DIAGNOSIS  Your caregiver will look for signs of kidney failure. Tests to diagnose kidney failure may include:  Urine tests: May reveal the presence of blood, protein or sugar.   Blood tests: May show low red blood cell count  (anemia) or high levels of waste products (BUN and creatinine) that are normally filtered out of the bloodstream by the kidneys.   Imaging tests - These are tests that create pictures of the organs inside the abdomen, such as the kidneys. They may reveal masses growing in the kidneys or blockages to the flow of urine. Possible imaging tests may include:   Ultrasound.   CT scan.   MRI.   Intravenous pyelogram or IVP. This is a test that involves injecting dye into the bloodstream and then taking a series of x-rays of the kidneys. This allows the kidneys and other parts of the urinary system to be viewed more clearly.   Kidney biopsy - A small sample of kidney is removed using a special needle. The sample is examined for abnormalities under a microscope.  TREATMENT  Chronic kidney failure cannot usually be cured. The various symptoms are treated, and measures are taken to avoid further kidney damage. Treatment for mild to moderate kidney failure may include:  Medication for high blood pressure.   Good control of diabetes.   Medication and diet change to improve anemia.   A low-sodium, low-potassium, low-protein and/or low-cholesterol diet.   Limiting the quantity of liquids in the diet.  Treatment for more severe kidney failure may require:  Dialysis - Mechanical methods of filtering the blood.   Kidney transplant - An operation that removes the diseased kidney and replaces it with a donated kidney.  HOME  CARE INSTRUCTIONS   Take medication as told by your caregiver.   Quit smoking if you are a smoker. Talk to your caregiver about a smoking cessation program.   Follow your prescribed diet.   If you are prescribed vitamins, take them as told.  SEEK IMMEDIATE MEDICAL CARE IF:  You start to produce less urine.   You notice blood in your urine.   You have increased pain.   You have increased weakness, fatigue or confusion.   You notice new swelling.   You develop a  fever.   You feel that you are having side effects of medicines prescribed.  Document Released: 10/09/2007 Document Revised: 12/19/2010 Document Reviewed: 01/21/2010 [facility] Patient Information 2012 [place].Methotrexate tablets What is this medicine? METHOTREXATE (METH oh TREX ate) is a chemotherapy drug. This medicine affects cells that are rapidly growing, such as cancer cells and cells in your mouth and stomach. It is used to treat many cancers and other medical conditions. It is used for leukemias, lymphomas, breast cancer, lung cancer, head and neck cancers, and other cancers. This medicine also works on the immune system and is commonly used to treat psoriasis and rheumatoid arthritis. If used for arthritis or psoriasis, the drug is only given once a week. This medicine may be used for other purposes; ask your health care provider or pharmacist if you have questions. What should I tell my health care provider before I take this medicine? They need to know if you have any of these conditions: -bleeding or blood disorders -HIV-positive or have acquired immunodeficiency syndrome (AIDS) -if you frequently drink alcohol-containing drinks -infection or weak immune system -kidney disease -liver disease -lung disease -stomach ulcers -ulcerative colitis -an unusual or allergic reaction to methotrexate, other medicines, foods, dyes, or preservatives -pregnant or trying to get pregnant -breast-feeding How should I use this medicine? Take this medicine by mouth. Swallow it with a full glass of water. Follow the directions on the prescription label. Do not take your medicine more often than directed. Finish the full course prescribed by your doctor or health care professional. Do not stop taking except on your doctor's advice. If you take methotrexate for rheumatoid arthritis or psoriasis, the dose is given only once a week. Do not take more frequently. Talk to your pediatrician regarding  the use of this medicine in children. Special care may be needed. Overdosage: If you think you have taken too much of this medicine contact a poison control center or emergency room at once. NOTE: This medicine is only for you. Do not share this medicine with others. What if I miss a dose? If you miss a dose, talk with your doctor or health care professional. Do not take double or extra doses. If you vomit after taking a dose, call your doctor or health care professional for advice. What may interact with this medicine? -antibiotics and other medicines for infections -aspirin and aspirin-like medicines including bismuth subsalicylate (Pepto-Bismol) -NSAIDs, medicines for pain and inflammation, like ibuprofen or naproxen -probenecid -trimetrexate -vaccines This list may not describe all possible interactions. Give your health care provider a list of all the medicines, herbs, non-prescription drugs, or dietary supplements you use. Also tell them if you smoke, drink alcohol, or use illegal drugs. Some items may interact with your medicine. What should I watch for while using this medicine? Visit your doctor or health care professional for checks on your progress. You will need to have regular blood checks. You will also need a chest  X-ray before starting the medicine. If you take the medicine for rheumatoid arthritis or psoriasis, you may not see an improvement in your condition for several weeks. Do not drink alcohol-containing drinks while taking this medicine. Both alcohol and the medicine may cause damage to your liver. This medicine may increase your risk of getting an infection. Stay away from people who are sick. To protect your kidneys, drink water or other fluids as directed while you are taking this medicine. Both men and women must use effective birth control. Use 2 reliable forms of birth control together. Do not become pregnant while taking this medicine. Women should continue to use  birth control until after their first normal menstrual cycle after stopping the medicine. Call your doctor right away if you think you or your partner might be pregnant. There is a potential for serious side effects to an unborn child. Talk to your health care professional or pharmacist for more information. Do not breast-feed an infant while taking this medicine. Men should continue to use birth control for at least 3 months after stopping the medicine. If you are going to have surgery or dental work, tell your health care professional that you are taking this medicine. This medicine can make you more sensitive to the sun. Keep out of the sun. If you cannot avoid being in the sun, wear protective clothing and use sunscreen. Do not use sun lamps or tanning beds/booths. What side effects may I notice from receiving this medicine? Side effects that you should report to your doctor or health care professional as soon as possible: -bruising, pinpoint red spots on the skin, black, tarry stools, blood in the urine -changes in vision -diarrhea -difficulty breathing or a dry cough -mouth and throat ulcers -redness, blistering, peeling or loosening of the skin, including inside the mouth -skin rash, hives, or itching -symptoms of infection like fever or chills, cough, sore throat, pain or difficulty passing urine -unusually weak or tired, fainting spells -vomiting -yellow coloring of skin or eyes Side effects that usually do not require medical attention (report to your doctor or health care professional if they continue or are bothersome): -dizziness -drowsiness -loss of appetite -nausea This list may not describe all possible side effects. Call your doctor for medical advice about side effects. You may report side effects to FDA at 1-800-FDA-1088. Where should I keep my medicine? Keep out of the reach of children. Store at room temperature between 20 and 25 degrees C (68 and 77 degrees F). Protect  from light. Throw away any unused medicine after the expiration date. NOTE: This sheet is a summary. It may not cover all possible information. If you have questions about this medicine, talk to your doctor, pharmacist, or health care provider.  2012, [employer]. (07/08/2007 11:12:16 AM)Psoriasis Psoriasis is a common, long-lasting (chronic) inflammation of the skin. It affects both men and women equally, of all ages and all races. Psoriasis cannot be passed from person to person (not contagious). Psoriasis varies from mild to very severe. When severe, it can greatly affect your quality of life. Psoriasis is an inflammatory disorder affecting the skin as well as other organs including the joints (causing an arthritis). With psoriasis, the skin sheds its top layer of cells more rapidly than it does in someone without psoriasis. CAUSES  The cause of psoriasis is largely unknown. Genetics, your immune system, and the environment seem to play a role in causing psoriasis. Factors that can make psoriasis worse include:  Damage  or trauma to the skin, such as cuts, scrapes, and sunburn. This damage often causes new areas of psoriasis (lesions).   Winter dryness and lack of sunlight.   Medicines such as lithium, beta-blockers, antimalarial drugs, ACE inhibitors, nonsteroidal anti-inflammatory drugs (ibuprofen, aspirin), and terbinafine. Let your caregiver know if you are taking any of these drugs.   Alcohol. Excessive alcohol use should be avoided if you have psoriasis. Drinking large amounts of alcohol can affect:   How well your psoriasis treatment works.   How safe your psoriasis treatment is.   Smoking. If you smoke, ask your caregiver for help to quit.   Stress.   Bacterial or viral infections.   Arthritis. Arthritis associated with psoriasis (psoriatic arthritis) affects less than 10% of patients with psoriasis. The arthritic intensity does not always match the skin psoriasis  intensity. It is important to let your caregiver know if your joints hurt or if they are stiff.  SYMPTOMS  The most common form of psoriasis begins with little red bumps that gradually become larger. The bumps begin to form scales that flake off easily. The lower layers of scales stick together. When these scales are scratched or removed, the underlying skin is tender and bleeds easily. These areas then grow in size and may become large. Psoriasis often creates a rash that looks the same on both sides of the body (symmetrical). It often affects the elbows, knees, groin, genitals, arms, legs, scalp, and nails. Affected nails often have pitting, loosen, thicken, crumble, and are difficult to treat.  "Inverse psoriasis"occurs in the armpits, under breasts, in skin folds, and around the groin, buttocks, and genitals.   "Guttate psoriasis" generally occurs in children and young adults following a recent sore throat (strep throat). It begins with many small, red, scaly spots on the skin. It clears spontaneously in weeks or a few months without treatment.  DIAGNOSIS  Psoriasis is diagnosed by physical exam. A tissue sample (biopsy) may also be taken. TREATMENT The treatment of psoriasis depends on your age, health, and living conditions.  Steroid (cortisone) creams, lotions, and ointments may be used. These treatments are associated with thinning of the skin, blood vessels that get larger (dilated), loss of skin pigmentation, and easy bruising. It is important to use these steroids as directed by your caregiver. Only treat the affected areas and not the normal, unaffected skin. People on long-term steroid treatment should wear a medical alert bracelet. Injections may be used in areas that are difficult to treat.   Scalp treatments are available as shampoos, solutions, sprays, foams, and oils. Avoid scratching the scalp and picking at the scales.   Anthralin medicine works well on areas that are difficult  to treat. However, it stains clothes and skin and may cause temporary irritation.   Synthetic vitamin D (calcipotriene)can be used on small areas. It is available by prescription. The forms of synthetic vitamin D available in health food stores do not help with psoriasis.   Coal tarsare available in various strengths for psoriasis that is difficult to treat. They are one of the longest used treatments for difficult to treat psoriasis. However, they are messy to use.   Light therapy (UV therapy) can be carefully and professionally monitored in a dermatologist's office. Careful sunbathing is helpful for many people as directed by your caregiver. The exposure should be just long enough to cause a mild redness (erythema) of your skin. Avoid sunburn as this may make the condition worse. Sunscreen (SPF of 75 or  higher) should be used to protect against sunburn. Cataracts, wrinkles, and skin aging are some of the harmful side effects of light therapy.   If creams (topical medicines) fail, there are several other options for systemic or oral medicines your caregiver can suggest.  Psoriasis can sometimes be very difficult to treat. It can come and go. It is necessary to follow up with your caregiver regularly if your psoriasis is difficult to treat. Usually, with persistence you can get a good amount of relief. Maintaining consistent care is important. Do not change caregivers just because you do not see immediate results. It may take several trials to find the right combination of treatment for you. PREVENTING FLARE-UPS  Wear gloves while you wash dishes, while cleaning, and when you are outside in the cold.   If you have radiators, place a bowl of water or damp towel on the radiator. This will help put water back in the air. You can also use a humidifier to keep the air moist. Try to keep the humidity at about 60% in your home.   Apply moisturizer while your skin is still damp from bathing or showering.  This traps water in the skin.   Avoid long, hot baths or showers. Keep soap use to a minimum. Soaps dry out the skin and wash away the protective oils. Use a fragrance free, dye free soap.   Drink enough water and fluids to keep your urine clear or pale yellow. Not drinking enough water depletes your skin's water supply.   Turn off the heat at night and keep it low during the day. Cool air is less drying.  SEEK MEDICAL CARE IF:  You have increasing pain in the affected areas.   You have uncontrolled bleeding in the affected areas.   You have increasing redness or warmth in the affected areas.   You start to have pain or stiffness in your joints.   You start feeling depressed about your condition.   You have a fever.  Document Released: 12/28/1999 Document Revised: 12/19/2010 Document Reviewed: 06/24/2010 [facility] Patient Information 2012 [place].

## 2011-10-09 NOTE — Progress Notes (Signed)
Subjective:     Patient ID: [name], female   DOB: Jan 11, 1941, 71 y.o.   MRN: [id number]  HPI 71 year old pleasant lady with her companion here for follow up of her DM.  She does not have chest pain but endorses feeling tired when she walks on the rise in her apartment building. She is also concerned about her Psoriasis. She feels she does not get enough exercise.  She was upset when she was informed that she has chronic kidney insufficiency.    Review of Systems  Constitutional: Negative.   HENT: Negative.   Eyes: Negative.   Respiratory: Negative.   Cardiovascular: Negative.   Gastrointestinal: Negative.   Genitourinary: Negative.   Musculoskeletal: Negative.   Neurological: Negative.   Hematological: Negative.   Psychiatric/Behavioral: Negative.        Objective:   Physical Exam  Constitutional: She is oriented to person, place, and time. She appears well-developed and well-nourished.  HENT:  Head: Normocephalic and atraumatic.  Eyes: Conjunctivae normal are normal. Pupils are equal, round, and reactive to light.  Neck: Normal range of motion. Neck supple.  Cardiovascular: Normal rate, regular rhythm and normal heart sounds.   Pulmonary/Chest: Effort normal and breath sounds normal.  Abdominal: Soft. Bowel sounds are normal.  Musculoskeletal: She exhibits no edema.  Neurological: She is alert and oriented to person, place, and time.  Skin:       Multiple guttate erythematous scaly lesions distributed over her back, legs covering 10 to 20 % BSA. 100% involvement of her scalp with infiltrated erythematous scaly large plaque.       Assessment:     DM type 2 CKD CAD HTN  Dyslipidemia Psoriasis Health Maintenance     Plan:     DM type 2: has had hypoglycemia episodes with documented BS below 70. Will d/c Glipizide. Will increase the Does of Lantus and Start her on medium coverage regular Insulin. Increased emphasis on exercise.  CKD : will refer to  Nephrology.   HTN : Well controlled on the current medications.  Dyslipidemia : increased TG s probably secondary to uncontrolled sugar. Continue statin. Will discuss zetia vs fibrates vs nicotinic acid for her non HDL cholesterol.  Health Maintenance: Given her vaccination today.  Psoriasis: She wishes to have systemic therapy for this. She also does not wish to visit dermatologist cause of the co pay. WE discussed about Methotrexate for the same. She has been given material to read. She will be checked for Hepatitis today.   CAD: post CABG. Has been taking Aspirin 325 mg - no real indication for the higher dose. Will start on 81 mg Aspirin. Continue the rest

## 2011-10-09 NOTE — Progress Notes (Signed)
Lasix and Novolin 70/30 rxs faxed to [facility] per pt's request.

## 2011-10-28 ENCOUNTER — Other Ambulatory Visit: Payer: Self-pay | Admitting: Internal Medicine

## 2011-10-28 DIAGNOSIS — Z1231 Encounter for screening mammogram for malignant neoplasm of breast: Secondary | ICD-10-CM

## 2011-11-04 ENCOUNTER — Ambulatory Visit (HOSPITAL_COMMUNITY): Payer: Medicare Other

## 2011-11-13 ENCOUNTER — Other Ambulatory Visit: Payer: Self-pay | Admitting: *Deleted

## 2011-11-13 DIAGNOSIS — I5022 Chronic systolic (congestive) heart failure: Secondary | ICD-10-CM

## 2011-11-13 DIAGNOSIS — M25559 Pain in unspecified hip: Secondary | ICD-10-CM

## 2011-11-13 DIAGNOSIS — E119 Type 2 diabetes mellitus without complications: Secondary | ICD-10-CM

## 2011-11-13 DIAGNOSIS — I10 Essential (primary) hypertension: Secondary | ICD-10-CM

## 2011-11-13 MED ORDER — VALSARTAN 320 MG PO TABS: 320.0000 mg | ORAL_TABLET | Freq: Every day | ORAL | Status: DC

## 2011-11-13 MED ORDER — TRAMADOL HCL 50 MG PO TABS: 50.0000 mg | ORAL_TABLET | Freq: Four times a day (QID) | ORAL | Status: DC | PRN

## 2011-11-13 MED ORDER — POTASSIUM CHLORIDE ER 10 MEQ PO TBCR: 10.0000 meq | EXTENDED_RELEASE_TABLET | Freq: Two times a day (BID) | ORAL | Status: DC

## 2011-11-13 MED ORDER — ESOMEPRAZOLE MAGNESIUM 40 MG PO CPDR: 40.0000 mg | DELAYED_RELEASE_CAPSULE | Freq: Every day | ORAL | Status: DC

## 2011-11-13 MED ORDER — INSULIN GLARGINE 100 UNIT/ML ~~LOC~~ SOLN: SUBCUTANEOUS | Status: DC

## 2011-11-13 MED ORDER — "INSULIN SYRINGE-NEEDLE U-100 31G X 5/16"" 0.3 ML MISC": Status: DC

## 2011-11-13 NOTE — Telephone Encounter (Signed)
Pt requesting 90 day supply. Thanks 

## 2011-11-17 ENCOUNTER — Ambulatory Visit (HOSPITAL_COMMUNITY)
Admission: RE | Admit: 2011-11-17 | Discharge: 2011-11-17 | Disposition: A | Discharge status: Home / Self Care | Payer: Medicare Other | Source: Ambulatory Visit | Attending: Internal Medicine | Admitting: Internal Medicine

## 2011-11-17 DIAGNOSIS — Z1231 Encounter for screening mammogram for malignant neoplasm of breast: Secondary | ICD-10-CM | POA: Insufficient documentation

## 2011-12-08 ENCOUNTER — Other Ambulatory Visit: Payer: Self-pay | Admitting: *Deleted

## 2011-12-08 DIAGNOSIS — E119 Type 2 diabetes mellitus without complications: Secondary | ICD-10-CM

## 2011-12-08 DIAGNOSIS — I10 Essential (primary) hypertension: Secondary | ICD-10-CM

## 2011-12-08 DIAGNOSIS — E785 Hyperlipidemia, unspecified: Secondary | ICD-10-CM

## 2011-12-08 MED ORDER — INSULIN NPH ISOPHANE & REGULAR (70-30) 100 UNIT/ML ~~LOC~~ SUSP: SUBCUTANEOUS | Status: DC

## 2011-12-08 MED ORDER — ROSUVASTATIN CALCIUM 40 MG PO TABS: 40.0000 mg | ORAL_TABLET | Freq: Every day | ORAL | Status: DC

## 2011-12-08 MED ORDER — ATENOLOL 25 MG PO TABS: 25.0000 mg | ORAL_TABLET | Freq: Every day | ORAL | Status: DC

## 2011-12-08 NOTE — Telephone Encounter (Signed)
Needs 90 day supplies.  Thanks

## 2011-12-10 ENCOUNTER — Other Ambulatory Visit: Payer: Self-pay | Admitting: Internal Medicine

## 2011-12-10 ENCOUNTER — Other Ambulatory Visit: Payer: Self-pay | Admitting: *Deleted

## 2011-12-10 MED ORDER — INSULIN REGULAR HUMAN 100 UNIT/ML IJ SOLN: INTRAMUSCULAR | Status: DC

## 2011-12-10 NOTE — Telephone Encounter (Signed)
Crestor and Novolin rxs called to [facility].

## 2011-12-10 NOTE — Telephone Encounter (Signed)
Correction - rxs were called to the pharmacy yesterday.

## 2011-12-10 NOTE — Telephone Encounter (Signed)
Novolin R w/sliding scale rx ,written today per Dr Ebbie Ridge to [facility].

## 2011-12-17 ENCOUNTER — Telehealth: Payer: Self-pay | Admitting: *Deleted

## 2011-12-17 NOTE — Telephone Encounter (Signed)
Called pt yesterday; to reiterate to continue taking Novolin R as she has been and do not take Novolin 70/30. Stated she had not received the 70/30 yet but the pharmacy had stated it was sent out . She agreed and understood.

## 2012-02-03 ENCOUNTER — Encounter: Payer: Self-pay | Admitting: Internal Medicine

## 2012-02-03 ENCOUNTER — Ambulatory Visit (INDEPENDENT_AMBULATORY_CARE_PROVIDER_SITE_OTHER): Payer: Medicare Other | Admitting: Internal Medicine

## 2012-02-03 VITALS — BP 113/53 | HR 52 | Temp 97.6°F | Ht 62.0 in | Wt 139.4 lb

## 2012-02-03 DIAGNOSIS — Z79899 Other long term (current) drug therapy: Secondary | ICD-10-CM

## 2012-02-03 DIAGNOSIS — E119 Type 2 diabetes mellitus without complications: Secondary | ICD-10-CM

## 2012-02-03 DIAGNOSIS — E785 Hyperlipidemia, unspecified: Secondary | ICD-10-CM

## 2012-02-03 LAB — LIPID PANEL
HDL: 44 mg/dL (ref >39)
LDL Cholesterol: 82 mg/dL (ref 0–99)
Triglycerides: 249 mg/dL — ABNORMAL HIGH (ref <150)
VLDL: 50 mg/dL — ABNORMAL HIGH (ref 0–40)

## 2012-02-03 LAB — COMPLETE METABOLIC PANEL WITH GFR
ALT: 13 U/L (ref 0–35)
AST: 17 U/L (ref 0–37)
Calcium: 9.7 mg/dL (ref 8.4–10.5)
Chloride: 100 mEq/L (ref 96–112)
Creat: 1.78 mg/dL — ABNORMAL HIGH (ref 0.50–1.10)
Sodium: 141 mEq/L (ref 135–145)
Total Bilirubin: 0.5 mg/dL (ref 0.3–1.2)

## 2012-02-03 LAB — POCT GLYCOSYLATED HEMOGLOBIN (HGB A1C): Hemoglobin A1C: 8.7

## 2012-02-03 MED ORDER — "INSULIN SYRINGE-NEEDLE U-100 31G X 15/64"" 1 ML MISC": 1.0000 | Freq: Four times a day (QID) | Status: DC

## 2012-02-03 MED ORDER — INSULIN REGULAR HUMAN 100 UNIT/ML IJ SOLN: INTRAMUSCULAR | Status: DC

## 2012-02-03 NOTE — Progress Notes (Signed)
Subjective:    Patient ID: [name], female    DOB: 1940/08/23, 72 y.o.   MRN: [id number]  HPI [name] is a 72 y.o. year old female with past medical problems significant for uncontrolled DM type 2 on high doses of insulin who comes to the clinic today for follow up of diabetes. Her last visit was with Dr. Margot [name] who adjusted her insulin regimen because she was apparently having hypoglycemic episodes.   [name] comes in with her live in partner. She has no other complains. She is compliant with her Lantus 50 units at bedtime and Novolin R sliding scale, which starts at 2 units for 131-180 mg/dl of blood sugar and has increments of 2 units. She has brought her glucometer. She denies any chest tightness, fever, palpitations or recent sickness. She denies episodes of hypoglycemia, rather is experiencing high readings in 300s and 400s. She denies any foot pain/numbness.   She has an appointment with nephrologist Dr. Hassell [name] on the 31st for her declining kidney function.   PAST MEDICAL HISTORY  has a past medical history of DM (diabetes mellitus); Lumbar spinal stenosis; History of colonic polyps; Diverticulosis; Nephrolithiasis; HTN (hypertension); HLD (hyperlipidemia); PVD (peripheral vascular disease); CAD (coronary artery disease) (2006); MI (myocardial infarction) (2003); Tricuspid regurgitation (2009); Psoriasis; OA (osteoarthritis); CKD (chronic kidney disease) stage 4, GFR 15-29 ml/min; Osteopenia (2010); CHF (congestive heart failure) (2010); MYOCARDIAL INFARCTION, HX OF (09/09/2006); DIVERTICULOSIS OF COLON (04/01/2007); NEPHROLITHIASIS (04/01/2007); and Mouth ulcer adjacent to jaw bone (08/22/2010).  MEDICATIONS She is taking Calcium/Vit D 500mg /200 units twice a day, aspirin 81 daily, atenolol 25 mg daily, nexium 40 mg daily, lasix 40 mg daily, potassium 10 meq BID, crestor 40 mg daily, tramadol 50 mg as needed, Insulin Lantus 50 units bedtime and Novolin R Sliding Scale as below.    70 - 130 mg/dL 0 units  131 - 180 mg/dL 2 units  181 -240 mg/dL 4 units  241 - 300 mg/dL 6 units  301- 350 mg/dL 8 units  351 - 400 mg/dL 10 units  Review of Systems  Constitutional: Negative for fever, chills and activity change.  Respiratory: Negative for chest tightness and shortness of breath.   Cardiovascular: Negative for chest pain, palpitations and leg swelling.  Gastrointestinal: Negative for diarrhea and constipation.  Musculoskeletal: Negative for back pain, joint swelling, arthralgias and gait problem.       Objective:   Physical Exam  Constitutional: She is oriented to person, place, and time. She appears well-developed and well-nourished.  HENT:  Head: Normocephalic and atraumatic.  Eyes: Conjunctivae normal and EOM are normal. Pupils are equal, round, and reactive to light.  Neck: Normal range of motion.  Cardiovascular: Normal rate, regular rhythm and normal heart sounds.   No murmur heard. Pulmonary/Chest: Effort normal and breath sounds normal.  Abdominal: Soft.  Neurological: She is alert and oriented to person, place, and time.  Skin: Skin is warm.  Psychiatric: Her behavior is normal.      Assessment & Plan:   1. Uncontrolled DM 2 on insulin: I reviewed the glucometer readings and there are none below 100. Most readings are between 100-200 with many being in 200-300 and a few even higher.  The worsening A1c trend is given below, with this visit being the highest at 8.7.   Results for [name], [name] (MRN [id number]) as of 02/03/2012 15:31  Ref. Range 11/14/2010 13:53 02/14/2011 10:35 05/22/2011 10:09 10/09/2011 10:01 02/03/2012 11:15  Hemoglobin A1C Latest Range: <5.7 %  8.2 8.2 8.5 8.2 8.7   We increased dose of Lantus to 55 units at bedtime and gave the novolin R sliding scale a 2 unit increament at all levels so it now starts at 4 units as follows:  70 - 130 mg/dL 0 units  131 - 180 mg/dL 4 units  181 -240 mg/dL 6 units  241 - 300 mg/dL 8 units  301-  350 mg/dL 10 units  351 - 400 mg/dL 12 units  The patient will come back in 2-4 weeks to get medications reassessed. Increasing physical activity and balanced diet was discussed. Blood work [name] this visit.  2. Kidney Insufficiency: The last creatinine values are given below.   Results for [name], [name] (MRN [id number]) as of 02/03/2012 15:31  Ref. Range 04/23/2010 10:24 07/18/2010 15:30 09/04/2010 13:03 02/14/2011 11:06 05/22/2011 09:53  Creatinine Latest Range: 0.50-1.10 mg/dL  1.55 (H) 1.71 (H) 1.59 (H) 1.56 (H)   This is secondary to diabetes most likely. The patient is also on lasix, however at this time I did not make changes in this medication as I want to manage her insulin first and not make too many changes. Given that she is meeting a nephrologist on January 31st, 14, I will look forward to their suggestions for management.   3. Preventive Measures   Vaccines  Flu vaccine: Sep 2013  Tetanus/Tdap: Sep 2013  Pneumovax : Oct 2008  Zostavax:May 2013  Colonoscopy: May 2013, I could not find the report. She had the last colonoscopy in 2008, with tubular adenomas. I think GI is repeating it every 5 years.   Recommendations for diabetics  Eye Exam: Sep 2012, next one pending.  Foot Exam: Sep 2013  Recommendations for women  Mammogram: Does not do.  PAP Smear: Does not do.  DEXA Scan: Nov 2010   NEXT VISIT: 2-4 weeks Eye Exam referral, revisit diabetes management, nephrology records.

## 2012-02-03 NOTE — Patient Instructions (Signed)
[name], 1. Please increase your lantus to 55 units at bedtime.  2. Please increase your humulin insulin as per following sliding scale.  Use sliding scale for HUMULIN R 70 - 130 mg/dL 0 units  131 - 180 mg/dL 4 units  181 -240 mg/dL  6 units  241 - 300 mg/dL  8 units  301- 350 mg/dL 10 units  351 - 400 mg/dL  12 units 3. Please come back to see me in 2-4 weeks. 4. Please bring your glucometer. 5. We discussed a good diet plan. Eating 3 balanced big meals and 3 small snacks in between will help your blood sugar control. 6. Increasing exercise.

## 2012-02-24 ENCOUNTER — Encounter: Payer: Self-pay | Admitting: Internal Medicine

## 2012-02-24 ENCOUNTER — Ambulatory Visit (INDEPENDENT_AMBULATORY_CARE_PROVIDER_SITE_OTHER): Payer: Medicare Other | Admitting: Internal Medicine

## 2012-02-24 VITALS — BP 109/53 | HR 54 | Temp 97.0°F | Ht 62.0 in | Wt 138.4 lb

## 2012-02-24 DIAGNOSIS — K219 Gastro-esophageal reflux disease without esophagitis: Secondary | ICD-10-CM

## 2012-02-24 DIAGNOSIS — I10 Essential (primary) hypertension: Secondary | ICD-10-CM

## 2012-02-24 DIAGNOSIS — L408 Other psoriasis: Secondary | ICD-10-CM

## 2012-02-24 DIAGNOSIS — E119 Type 2 diabetes mellitus without complications: Secondary | ICD-10-CM

## 2012-02-24 DIAGNOSIS — I5022 Chronic systolic (congestive) heart failure: Secondary | ICD-10-CM

## 2012-02-24 DIAGNOSIS — N189 Chronic kidney disease, unspecified: Secondary | ICD-10-CM

## 2012-02-24 DIAGNOSIS — E785 Hyperlipidemia, unspecified: Secondary | ICD-10-CM

## 2012-02-24 DIAGNOSIS — L409 Psoriasis, unspecified: Secondary | ICD-10-CM

## 2012-02-24 DIAGNOSIS — M25559 Pain in unspecified hip: Secondary | ICD-10-CM

## 2012-02-24 MED ORDER — TRAMADOL HCL 50 MG PO TABS: ORAL_TABLET | ORAL | Status: DC

## 2012-02-24 MED ORDER — ESOMEPRAZOLE MAGNESIUM 40 MG PO CPDR: 40.0000 mg | DELAYED_RELEASE_CAPSULE | Freq: Every day | ORAL | Status: DC

## 2012-02-24 MED ORDER — VALSARTAN 320 MG PO TABS: 320.0000 mg | ORAL_TABLET | Freq: Every day | ORAL | Status: DC

## 2012-02-24 MED ORDER — ATENOLOL 25 MG PO TABS: 25.0000 mg | ORAL_TABLET | Freq: Every day | ORAL | Status: DC

## 2012-02-24 MED ORDER — INSULIN REGULAR HUMAN 100 UNIT/ML IJ SOLN: INTRAMUSCULAR | Status: DC

## 2012-02-24 MED ORDER — INSULIN GLARGINE 100 UNIT/ML ~~LOC~~ SOLN: 58.0000 [IU] | Freq: Every day | SUBCUTANEOUS | Status: DC

## 2012-02-24 MED ORDER — "INSULIN SYRINGE-NEEDLE U-100 31G X 15/64"" 1 ML MISC": 1.0000 | Freq: Four times a day (QID) | Status: DC

## 2012-02-24 MED ORDER — ROSUVASTATIN CALCIUM 10 MG PO TABS: 10.0000 mg | ORAL_TABLET | Freq: Every day | ORAL | Status: DC

## 2012-02-24 NOTE — Progress Notes (Signed)
Patient ID: [name], female   DOB: Mar 14, 1940, 72 y.o.   MRN: [id number]  [name] is a 72 y.o. year old female with past medical problems significant for diabetes mellitus type 2 on insulin, uncontrolled who comes to the clinic today for diabetes follow from her last visit about 3 weeks ago. We are closely following her up to adjust her insulin and get her diabetes under control. She says she is seeing better numbers since we increased her lantus to 55 units at bedtime and went up on her Novolin sliding scale by 2 units. Her glucometer readings on her last visits were mostly between 300 - 400.  She wants to ask me about Enbrel for her psoriasis. She says she has plaques on her elbows, under breasts, on her head. She uses two ointments to keep them under control and is not satisfied.     She feels well otherwise, with no other complaints. She has met the kidney doctor, Dr. Salem Senate and will follow up with him.   PAST MEDICAL HISTORY  has a past medical history of DM (diabetes mellitus); Lumbar spinal stenosis; History of colonic polyps; Diverticulosis; Nephrolithiasis; HTN (hypertension); HLD (hyperlipidemia); PVD (peripheral vascular disease); CAD (coronary artery disease) (2006); MI (myocardial infarction) (2003); Tricuspid regurgitation (2009); Psoriasis; OA (osteoarthritis); CKD (chronic kidney disease) stage 4, GFR 15-29 ml/min; Osteopenia (2010); CHF (congestive heart failure) (2010); MYOCARDIAL INFARCTION, HX OF (09/09/2006); DIVERTICULOSIS OF COLON (04/01/2007); NEPHROLITHIASIS (04/01/2007); and Mouth ulcer adjacent to jaw bone (08/22/2010).  MEDICATIONS She is taking Calcium/Vit D 500mg /200 units twice a day, aspirin 81 daily, atenolol 25 mg daily, nexium 40 mg daily, lasix 40 mg daily, potassium 10 meq BID, crestor 40 mg daily, tramadol 50 mg 6 hourly as needed for pain, Insulin Lantus 55 units bedtime and Novolin R Sliding Scale as below.  70 - 130 mg/dL 0 units  131 - 180 mg/dL  4 units  181 -240 mg/dL 6 units  241 - 300 mg/dL 8 units  301- 350 mg/dL 10 units  351 - 400 mg/dL 12 units  ROS Constitutional: Denies fever, chills, diaphoresis, appetite change and fatigue.  Respiratory: Denies SOB, DOE, cough, chest tightness,  and wheezing.   Cardiovascular: Denies chest pain, palpitations and leg swelling.  Gastrointestinal: Denies nausea, vomiting, abdominal pain, diarrhea, constipation.  VITALS Filed Vitals:   02/24/12 1009  BP: 109/53  Pulse: 54  Temp: 97 F (36.1 C)    PHYSICAL EXAM Vitals reviewed.  General:Comfortably sitting in chair, no distress. Heart: RRR, no rubs, murmurs or gallops. Lungs: Clear to auscultation bilaterally, no wheezes, rales, or rhonchi. Abdomen: Soft, nontender, nondistended, BS present. Neuro: Alert and oriented X3.  Skin: Red plaques on elbow, and under breasts.  Foot exam: Bilateral dorasalis pedis pulses present, no edema, no open skin wounds, no nail deformities, normal dorsiflexion and plantar flexion strength, bilateral touch sensation intact, bilateral ankle jerk 1+ present.  ASSESSMENT  Diabetes mellitus on insulin: Today we reviewed her glucometer readings again, and noticed favorable changes. Her fasting sugars are averaging at 150 with 72% on target. She did have a reading of 92 only one time. Her post prandial sugars are averaging at 220-230 at lunch and dinner. Thus, today I am increasing Lantus to 58 units only, and going up by 2 units on Novolin again. She tells me that she takes dinner at 5:30 pm and then, does not take anything to eat while doing her Lantus at around 10:30 pm. I have asked  her to start taking a nighttime snack with Lantus administration, to avoid low blood sugars at night. She agrees and says she will do it. We have given her 3 month supplies of diabetes (insulin and syringes) today upon her request. We did a foot exam today.   Psoriasis: I have requested her to see a dermatologist to be  started on Enbrel if she wants to. She expressed concern over the copay of specialist visits. I tried to assure her that once she has had initial consultations we will try to take it from there and try to manage her medications, only sending her to a dermatologist when needed. Referral given.   Chronic renal insufficiency: I have cut down on her Tramadol, and Crestor because her CrCl is 28. She will be following up with Dr. Salem Senate, nephrologist regularly. She is on Diovan and Lasix and has been instructed by Dr. Hassell Done to stop taking them and call the clinic in case she get diarrhea and vomiting.    NEXT VISIT in 6 weeks (April 2014). We will re-adjust insulin, re-do BMP if needed, do lipids to see if the patient is okay on reduced dosage of Crestor.  Eye exam next time.   Preventive: We did foot exam today.

## 2012-02-24 NOTE — Patient Instructions (Addendum)
[name],  1. We reviewed your glucometer readings and went up on your insulin today.   You will take LANTUS 58 units at night.  You will follow the new sliding scale for NOVOLIN that I have written for you.  70 - 130 mg/dL 0 units  131 - 180 mg/dL 6 units  181 -240 mg/dL 8 units  241 - 300 mg/dL 10 units  301- 350 mg/dL 12 units  351 - 400 mg/dL 14 units  Please take a small bed time snack with your LANTUS as we have discussed.   2. We discussed about ENBREL for psoriasis and I would like you to see a dermatologist to consult on starting it. I have given a referral and the clinic should call you with an appointment.  NEXT VISIT in about 6 weeks. Please bring your glucometer.  Thanks Russell, Aminah Zabawa

## 2012-04-01 ENCOUNTER — Other Ambulatory Visit: Payer: Self-pay | Admitting: Nephrology

## 2012-04-06 ENCOUNTER — Ambulatory Visit (INDEPENDENT_AMBULATORY_CARE_PROVIDER_SITE_OTHER): Payer: Medicare Other | Admitting: Internal Medicine

## 2012-04-06 ENCOUNTER — Ambulatory Visit
Admission: RE | Admit: 2012-04-06 | Discharge: 2012-04-06 | Disposition: A | Discharge status: Home / Self Care | Payer: Medicare Other | Source: Ambulatory Visit | Attending: Nephrology | Admitting: Nephrology

## 2012-04-06 ENCOUNTER — Encounter: Payer: Self-pay | Admitting: Internal Medicine

## 2012-04-06 VITALS — BP 120/61 | HR 51 | Temp 97.1°F | Ht 62.0 in | Wt 138.4 lb

## 2012-04-06 DIAGNOSIS — I10 Essential (primary) hypertension: Secondary | ICD-10-CM

## 2012-04-06 DIAGNOSIS — L408 Other psoriasis: Secondary | ICD-10-CM

## 2012-04-06 DIAGNOSIS — M79609 Pain in unspecified limb: Secondary | ICD-10-CM

## 2012-04-06 DIAGNOSIS — L409 Psoriasis, unspecified: Secondary | ICD-10-CM

## 2012-04-06 DIAGNOSIS — G8929 Other chronic pain: Secondary | ICD-10-CM

## 2012-04-06 DIAGNOSIS — E785 Hyperlipidemia, unspecified: Secondary | ICD-10-CM

## 2012-04-06 DIAGNOSIS — E1165 Type 2 diabetes mellitus with hyperglycemia: Secondary | ICD-10-CM

## 2012-04-06 DIAGNOSIS — I129 Hypertensive chronic kidney disease with stage 1 through stage 4 chronic kidney disease, or unspecified chronic kidney disease: Secondary | ICD-10-CM

## 2012-04-06 DIAGNOSIS — N189 Chronic kidney disease, unspecified: Secondary | ICD-10-CM

## 2012-04-06 DIAGNOSIS — M79604 Pain in right leg: Secondary | ICD-10-CM

## 2012-04-06 MED ORDER — CLOBETASOL PROPIONATE 0.05 % EX OINT: TOPICAL_OINTMENT | Freq: Two times a day (BID) | CUTANEOUS | Status: DC

## 2012-04-06 MED ORDER — CALCIPOTRIENE 0.005 % EX OINT: TOPICAL_OINTMENT | Freq: Two times a day (BID) | CUTANEOUS | Status: DC

## 2012-04-06 NOTE — Patient Instructions (Addendum)
Hi Ms Cowley,  Your blood pressure is well controlled, however you are seeing Dr. Hassell Done for this, and your kidneys, so I will not interfere with his plan of care.  Your diabetes, is doing well from your report on the increased 58 units of lantus and increased amount of Novolin. You have not had any low sugars, except the one this morning (which was because you have not eaten). As we discussed, if you see that you are having low sugars 2-3 days in a row, cut back on your lantus by 2 units, and call the clinic at [phone]. Please bring your glucometer the next visit. For your psoriasis, we are starting you on a new cream called CLOBETASOL. We wanted to do blood work today, but since you already got it done a couple weeks ago at Dr. Hassell Done, we will get it from there.   Let us see each other in 3 months.   Thanks [employer]

## 2012-04-06 NOTE — Progress Notes (Signed)
Subjective:     Patient ID: [name], female   DOB: [birth year], 72 y.o.   MRN: [id number]  HPI [name] is a 72 year old lady with uncontrolled diabetes mellitus being managed on 58 units of lantus and sliding scale novolin. She is coming for regular follow-up. She has not brought her glucometer today. She tells me that her readings have been fairly good, some of the fasting levels even under a 100. She denies any symptoms of hypoglycemia. She is taking a night time snack as told to her in the last visit.   She also has CKD which seems to be worsening, and she is following up on her hypertension and CKD with Dr. Salem Senate. She has an appointment with him tomorrow. Her blood pressure is well controlled on Diovan, lasix, and atenolol.   She also has ongoing psoriasis which bothers her. Her dermatology appointment is in August.  She applies Desowen cream, and calcipotriene ointment for it and is not benefited much.   Complains of right calf pain, pins and needles like sensation, going on for months. No problem with left leg. Does not feel dull sensation on sole/surrounding foot skin. Does not feel pins and needles on feet. Worse at night, benefited by tramadol.    Review of Systems Denies chest pain, palpitations or shortness of breath.  Denies dizziness, sweating, fainting. Denies headaches. Denies abdominal pain, nausea, vomiting, diarrhea. Denies urinary changes/dismfort.  Admits right calf pain, chronic.     Objective:   Physical Exam General: No acute distress Heart: S1S2 RRR, no murmur. Lungs: No adventitious sounds Legs: No impairment of sensation on feet or leg. Left calf, not swollen, no erythema, normal temperature, non-tender. Skin: Plaques, white gray under breasts, and extensor elbows.      Assessment:     1. Diabetes mellitus, uncontrolled on insulin 2. Hypertension, controlled 3. Chronic Kidney disease, worsening 4. Psoriasis, with plaques 5. Right calf pain  - question diabetic neuropathy     Plan:    1. Diabetes - Continue with the same regimen. Instructed the patient to cut back on long acting insulin by 2 units if blood sugars are repeatedly under 90 and it feels uncomfortable. She will bring her glucometer next visit. Not making any changes today.   2. CKD, Hypertension - Under the care of Dr. Salem Senate. We will get blood work done by them a few weeks ago. She is getting a renal ultrasound today and will have a visit with Dr. Hassell Done tomorrow. (Ultrasound followed -- bilateral renal cysts, otherwise normal kidneys and no evidence of renal artery stenosis) I will follow up on the visit with Dr. Hassell Done.   3. Psoriasis - I started clobestol oint today. Dermatology appointment in August.   4. Preventive measures - Got her eye exam done recently. We will get reports.   5. Right calf pain - the patient wants to follow up and think about gabapentin use. Will consider next visit.

## 2012-04-08 ENCOUNTER — Other Ambulatory Visit: Payer: Medicare Other

## 2012-04-20 ENCOUNTER — Other Ambulatory Visit: Payer: Self-pay | Admitting: *Deleted

## 2012-04-20 DIAGNOSIS — E119 Type 2 diabetes mellitus without complications: Secondary | ICD-10-CM

## 2012-04-20 NOTE — Telephone Encounter (Signed)
Needs 90 days supply   3 -4 times a day Bayer contuor

## 2012-04-21 ENCOUNTER — Telehealth: Payer: Self-pay | Admitting: *Deleted

## 2012-04-21 MED ORDER — GLUCOSE BLOOD VI STRP: ORAL_STRIP | Status: DC

## 2012-04-21 NOTE — Telephone Encounter (Signed)
Checking on strips

## 2012-06-15 ENCOUNTER — Ambulatory Visit (INDEPENDENT_AMBULATORY_CARE_PROVIDER_SITE_OTHER): Payer: Medicare Other | Admitting: Internal Medicine

## 2012-06-15 ENCOUNTER — Encounter: Payer: Self-pay | Admitting: Internal Medicine

## 2012-06-15 VITALS — BP 129/62 | HR 49 | Temp 96.9°F | Ht 62.0 in | Wt 139.5 lb

## 2012-06-15 DIAGNOSIS — I10 Essential (primary) hypertension: Secondary | ICD-10-CM

## 2012-06-15 DIAGNOSIS — E119 Type 2 diabetes mellitus without complications: Secondary | ICD-10-CM

## 2012-06-15 DIAGNOSIS — M25559 Pain in unspecified hip: Secondary | ICD-10-CM

## 2012-06-15 DIAGNOSIS — I5022 Chronic systolic (congestive) heart failure: Secondary | ICD-10-CM

## 2012-06-15 DIAGNOSIS — E785 Hyperlipidemia, unspecified: Secondary | ICD-10-CM

## 2012-06-15 DIAGNOSIS — N184 Chronic kidney disease, stage 4 (severe): Secondary | ICD-10-CM

## 2012-06-15 DIAGNOSIS — K219 Gastro-esophageal reflux disease without esophagitis: Secondary | ICD-10-CM

## 2012-06-15 LAB — CBC
HCT: 36.1 % (ref 36.0–46.0)
Hemoglobin: 12.3 g/dL (ref 12.0–15.0)
MCV: 86 fL (ref 78.0–100.0)
WBC: 5.6 10*3/uL (ref 4.0–10.5)

## 2012-06-15 LAB — BASIC METABOLIC PANEL WITH GFR
BUN: 25 mg/dL — ABNORMAL HIGH (ref 6–23)
Chloride: 100 mEq/L (ref 96–112)
Creat: 1.53 mg/dL — ABNORMAL HIGH (ref 0.50–1.10)
Glucose, Bld: 154 mg/dL — ABNORMAL HIGH (ref 70–99)
Potassium: 4.2 mEq/L (ref 3.5–5.3)

## 2012-06-15 LAB — POCT GLYCOSYLATED HEMOGLOBIN (HGB A1C): Hemoglobin A1C: 8.5

## 2012-06-15 MED ORDER — ATENOLOL 25 MG PO TABS: 12.5000 mg | ORAL_TABLET | Freq: Every day | ORAL | Status: DC

## 2012-06-15 MED ORDER — TRAMADOL HCL 50 MG PO TABS: ORAL_TABLET | ORAL | Status: DC

## 2012-06-15 MED ORDER — ESOMEPRAZOLE MAGNESIUM 20 MG PO CPDR: 20.0000 mg | DELAYED_RELEASE_CAPSULE | Freq: Every day | ORAL | Status: DC

## 2012-06-15 MED ORDER — ROSUVASTATIN CALCIUM 10 MG PO TABS: 40.0000 mg | ORAL_TABLET | Freq: Every day | ORAL | Status: DC

## 2012-06-15 MED ORDER — ESOMEPRAZOLE MAGNESIUM 40 MG PO CPDR: 40.0000 mg | DELAYED_RELEASE_CAPSULE | Freq: Every day | ORAL | Status: DC

## 2012-06-15 MED ORDER — VALSARTAN 320 MG PO TABS: 320.0000 mg | ORAL_TABLET | Freq: Every day | ORAL | Status: DC

## 2012-06-15 MED ORDER — ROSUVASTATIN CALCIUM 40 MG PO TABS: 40.0000 mg | ORAL_TABLET | Freq: Every day | ORAL | Status: DC

## 2012-06-15 NOTE — Progress Notes (Signed)
Subjective:     Patient ID: [name], female   DOB: [birth year], 72 y.o.   MRN: [id number]  HPI [name] comes in with her female friend, the same person who usually accompanies her to all her visits. She is being followed for primarily her diabetes by me. She reports no symptoms. She does admit that she has not been careful about her diet, and has been taking raisin cereal and ample carbohydrate meals until recently. She hs been inconsistently taking a night time snack with her bedtime insulin. All this is readily explained in her glucometer log where she has some lower range sugars (74 lowest in fasting sugars) among optimum and higher range sugars (>350 couple times before lunch). She denies any constant dizziness, but says that she might have felt light headed sometimes.    Review of Systems No chest pain or pressure, no shortness of breath, no other cardiac symptoms reported by the patient.   Vitals:   Filed Vitals:   06/15/12 1023  BP: 129/62  Pulse: 49  Temp: 96.9 F (36.1 C)  TempSrc: Oral  Height: 5\' 2"  (1.575 m)  Weight: 139 lb 8 oz (63.277 kg)  SpO2: 96%      Objective:   Physical Exam  Vitals reviewed. Constitutional: No distress.  Cardiovascular: Normal rate, regular rhythm and normal heart sounds.   No murmur heard. Pulmonary/Chest: Effort normal and breath sounds normal. No respiratory distress.  Abdominal: Soft.  Musculoskeletal: She exhibits no edema.  Skin: Rash (Psoriasis, looks much better than before) noted.      Assessment & Plan:     Diabetes Mellitus, fairly controlled - A1c is getting better.   Lab Results  Component Value Date   HGBA1C 8.5 06/15/2012   HGBA1C 8.7 02/03/2012   HGBA1C 8.2 10/09/2011   Lab Results  Component Value Date   MICROALBUR 1.89 05/22/2011   LDLCALC 82 02/03/2012   CREATININE 1.78* 02/03/2012   We will keep the night time lantus same at 58 units and go up on the sliding scale by 2 units. The revised sliding scale being:    70 - 130 mg/dL 0 units  131 - 180 mg/dL 8 units  181 -240 mg/dL 10 units  241 - 300 mg/dL 12 units  301- 350 mg/dL 14 units  351 - 400 mg/dL 16 units  >400 mg/dL 18 units and call your doctor if this happens repeatedly.  The patient refuses to see Butch Penny for nutrition couseling at this time  Bradycardia, asymptomatic, regular heart rate, on beta blocker - Initially I did think about decreasing the dose to half, however, since the patient is asymptomatic and does have an extensive history of MI, I would keep atenolol going for now at 25 mg daily. Discussed with patient that if she starts having any dizziness, chest pain, or palpitations, she should let us know.    CKD - The patient is being followed by [facility] for this.    Psoriasis - Under control, looks better today. She is using the creams she has inconsistently.    Next visit 2 months.

## 2012-06-15 NOTE — Patient Instructions (Addendum)
[name],  Your blood sugar is slowly getting better. We will increase your meal time insulin by 2 units.   70 - 130 mg/dL 0 units  131 - 180 mg/dL 8 units  181 -240 mg/dL 10 units  241 - 300 mg/dL 12 units  301- 350 mg/dL 14 units  351 - 400 mg/dL 16 units >400 mg/dL 18 units and call your doctor if this happens repeatedly.  For low heart rate - If you have chest pain, palpitations, dizziness, fainting or feeling of confusion and an altered state of mind - please go to ER.   We meet again in 2 months.   Thanks, [name] MD MPH 06/15/2012 11:13 AM

## 2012-06-17 ENCOUNTER — Other Ambulatory Visit: Payer: Self-pay | Admitting: Internal Medicine

## 2012-06-17 DIAGNOSIS — E785 Hyperlipidemia, unspecified: Secondary | ICD-10-CM

## 2012-06-17 MED ORDER — ROSUVASTATIN CALCIUM 10 MG PO TABS: 10.0000 mg | ORAL_TABLET | Freq: Every day | ORAL | Status: DC

## 2012-07-22 ENCOUNTER — Other Ambulatory Visit: Payer: Self-pay

## 2012-08-31 ENCOUNTER — Encounter: Payer: Medicare Other | Admitting: Internal Medicine

## 2012-08-31 ENCOUNTER — Other Ambulatory Visit: Payer: Self-pay | Admitting: Dietician

## 2012-08-31 DIAGNOSIS — E119 Type 2 diabetes mellitus without complications: Secondary | ICD-10-CM

## 2012-08-31 MED ORDER — "INSULIN SYRINGE-NEEDLE U-100 31G X 15/64"" 0.3 ML MISC": 1.0000 | Freq: Three times a day (TID) | Status: DC

## 2012-08-31 MED ORDER — "INSULIN SYRINGE-NEEDLE U-100 31G X 15/64"" 1 ML MISC": 1.0000 | Freq: Every day | Status: DC

## 2012-08-31 NOTE — Telephone Encounter (Signed)
Patient called requesting smaller syringe for mealtime doses [facility] day supply

## 2012-09-02 ENCOUNTER — Ambulatory Visit (INDEPENDENT_AMBULATORY_CARE_PROVIDER_SITE_OTHER): Payer: Medicare Other | Admitting: Internal Medicine

## 2012-09-02 ENCOUNTER — Encounter: Payer: Self-pay | Admitting: Internal Medicine

## 2012-09-02 VITALS — BP 148/68 | HR 56 | Temp 97.5°F | Wt 141.4 lb

## 2012-09-02 DIAGNOSIS — N184 Chronic kidney disease, stage 4 (severe): Secondary | ICD-10-CM

## 2012-09-02 DIAGNOSIS — L409 Psoriasis, unspecified: Secondary | ICD-10-CM

## 2012-09-02 DIAGNOSIS — E785 Hyperlipidemia, unspecified: Secondary | ICD-10-CM

## 2012-09-02 DIAGNOSIS — M25519 Pain in unspecified shoulder: Secondary | ICD-10-CM

## 2012-09-02 DIAGNOSIS — M25511 Pain in right shoulder: Secondary | ICD-10-CM

## 2012-09-02 DIAGNOSIS — L408 Other psoriasis: Secondary | ICD-10-CM

## 2012-09-02 DIAGNOSIS — I10 Essential (primary) hypertension: Secondary | ICD-10-CM

## 2012-09-02 DIAGNOSIS — E1165 Type 2 diabetes mellitus with hyperglycemia: Secondary | ICD-10-CM

## 2012-09-02 MED ORDER — CLOBETASOL PROP CREA-COAL TAR 0.05 & 2.3 % EX KIT: PACK | CUTANEOUS | Status: DC

## 2012-09-02 MED ORDER — BETAMETHASONE DIPROPIONATE 0.05 % EX LOTN: TOPICAL_LOTION | Freq: Two times a day (BID) | CUTANEOUS | Status: DC

## 2012-09-02 NOTE — Progress Notes (Signed)
Subjective:     Patient ID: [name], female   DOB: 12-04-1940, 72 y.o.   MRN: [id number]  HPI [name] follows up for hypertension and diabetes. She also has chronic kidney insufficiency for which she follows up with [facility]. She has complaints of right shoulder pain today.   Right Shoulder pain - Come with exertion, relieved by rest, dull in nature, about 5/10. She does not take any medicines for it. She has had it for some time but it has been bothering her more presently. She uses tramadol every other night before bedtime to help her right knee pain. It sometimes helps with the shoulder.  Diabetes type 2 on insulin - Her glucometer readings average about 153 in the mornings, 194 at lunch time and 176 in the evenings. She admits to dietary indiscretions like eating icecream sandwiches and chocolate desserts many times. She checks her blood sugars 3-4 times a day.  Hypertension - She has been taking atenolol 12.5 mg daily, and valsartan 320 mg for her blood pressure. She measures her blood pressure at home about once a week. She says that her home readings have been normal. Her clinic reading today was mildly elevated.  Psoriasis - She made her dermatology appointment and has been prescribed Betamethasone Dipropionate Lotion 0.05% and Clobetasol Propionate Cream to apply. She has benefited immensely from this treatment and her scalp psoriasis has almost cleared.   She denies any chest pain, palpitations, dizziness, vision changes, diarrhea, constipation or any interim illnesses.  Review of Systems As per HPI    Objective:   Physical Exam General: No acute distress.  HEENT: PERRL, EOMI.   CV: S1S2 RRR, no murmur Lungs: Bilateral Vesicular breath sounds.  Abdomen: Soft, non-tender, benign Pedal Edema: none Pedal pulses present.  Neuro: Alert and oriented times 3. No gross deficits seen.   Right Shoulder Exam: Mildly limited movement in all directions, especially when  trying to take the arm above 60 degrees. Cross shoulder test could be done, but Apley Scratch could not be done. Negative drop arm test and hawkins test.       Assessment & Plan:     Right Shoulder pain - Likely osteoarthritis or adhesive capsulitis - Advised gradual exercises, with adequate rest if pain. Hot/cold compress as desired. Tramadol okay if severe pain. Patient declines physical therapy due to financial situation.  Diabetes - Once again, stressed on the importance of diet. However, I will keep the same insulin regimen for now, given the age and co-morbidies of the patient, I don't think its wise to go after stricter targets.   Hypertension - Bradycardia improved. The patient has been taking half tablet of tenormin 12.5 mg. She says her home blood measurements have been okay, however her blood pressure was mildly elevated today. I have asked her to get a blood pressure log so we can reassess her regimen.  Next visit in 3 months.

## 2012-09-02 NOTE — Patient Instructions (Signed)
Ms Ruman,  It was a pleasure to have you! You are doing well. Your blood glucose readings look fairly controlled. We will not make any changes in your insulin this time.  Please keep taking your blood pressure medicines as you are. Let us meet in 3 months.   Thank you [name]

## 2012-10-29 ENCOUNTER — Other Ambulatory Visit: Payer: Self-pay | Admitting: Internal Medicine

## 2012-10-29 DIAGNOSIS — Z1231 Encounter for screening mammogram for malignant neoplasm of breast: Secondary | ICD-10-CM

## 2012-11-10 ENCOUNTER — Other Ambulatory Visit: Payer: Self-pay | Admitting: *Deleted

## 2012-11-10 DIAGNOSIS — I5022 Chronic systolic (congestive) heart failure: Secondary | ICD-10-CM

## 2012-11-10 DIAGNOSIS — I10 Essential (primary) hypertension: Secondary | ICD-10-CM

## 2012-11-11 MED ORDER — ATENOLOL 25 MG PO TABS: 12.5000 mg | ORAL_TABLET | Freq: Every day | ORAL | Status: DC

## 2012-11-11 MED ORDER — FUROSEMIDE 40 MG PO TABS: 40.0000 mg | ORAL_TABLET | Freq: Every day | ORAL | Status: DC

## 2012-11-11 NOTE — Telephone Encounter (Signed)
Lasix rx faxed to [facility].

## 2012-11-23 ENCOUNTER — Ambulatory Visit (HOSPITAL_COMMUNITY): Payer: Medicare Other

## 2012-12-03 ENCOUNTER — Ambulatory Visit (HOSPITAL_COMMUNITY)
Admission: RE | Admit: 2012-12-03 | Discharge: 2012-12-03 | Disposition: A | Discharge status: Home / Self Care | Payer: Medicare Other | Source: Ambulatory Visit | Attending: Internal Medicine | Admitting: Internal Medicine

## 2012-12-03 DIAGNOSIS — Z1231 Encounter for screening mammogram for malignant neoplasm of breast: Secondary | ICD-10-CM | POA: Insufficient documentation

## 2012-12-07 ENCOUNTER — Ambulatory Visit: Payer: Medicare Other | Admitting: Internal Medicine

## 2012-12-08 ENCOUNTER — Other Ambulatory Visit: Payer: Self-pay | Admitting: *Deleted

## 2012-12-08 DIAGNOSIS — E119 Type 2 diabetes mellitus without complications: Secondary | ICD-10-CM

## 2012-12-08 MED ORDER — INSULIN REGULAR HUMAN 100 UNIT/ML IJ SOLN: INTRAMUSCULAR | Status: DC

## 2012-12-08 NOTE — Telephone Encounter (Signed)
Please review and confirm correct dose then send electronically

## 2012-12-28 ENCOUNTER — Ambulatory Visit (INDEPENDENT_AMBULATORY_CARE_PROVIDER_SITE_OTHER): Payer: Medicare Other | Admitting: Internal Medicine

## 2012-12-28 ENCOUNTER — Encounter: Payer: Self-pay | Admitting: Internal Medicine

## 2012-12-28 VITALS — BP 130/66 | HR 54 | Temp 97.4°F | Ht 61.0 in | Wt 137.4 lb

## 2012-12-28 DIAGNOSIS — K219 Gastro-esophageal reflux disease without esophagitis: Secondary | ICD-10-CM

## 2012-12-28 DIAGNOSIS — I10 Essential (primary) hypertension: Secondary | ICD-10-CM

## 2012-12-28 DIAGNOSIS — E119 Type 2 diabetes mellitus without complications: Secondary | ICD-10-CM

## 2012-12-28 DIAGNOSIS — M25559 Pain in unspecified hip: Secondary | ICD-10-CM

## 2012-12-28 DIAGNOSIS — Z0000 Encounter for general adult medical examination without abnormal findings: Secondary | ICD-10-CM

## 2012-12-28 LAB — GLUCOSE, CAPILLARY: Glucose-Capillary: 352 mg/dL — ABNORMAL HIGH (ref 70–99)

## 2012-12-28 MED ORDER — VALSARTAN 320 MG PO TABS: 320.0000 mg | ORAL_TABLET | Freq: Every day | ORAL | Status: DC

## 2012-12-28 MED ORDER — FUROSEMIDE 40 MG PO TABS: 40.0000 mg | ORAL_TABLET | Freq: Every day | ORAL | Status: DC

## 2012-12-28 MED ORDER — GLUCOSE BLOOD VI STRP: ORAL_STRIP | Status: DC

## 2012-12-28 MED ORDER — "INSULIN SYRINGE-NEEDLE U-100 31G X 15/64"" 0.3 ML MISC": 1.0000 | Freq: Three times a day (TID) | Status: DC

## 2012-12-28 MED ORDER — INSULIN GLARGINE 100 UNIT/ML ~~LOC~~ SOLN: 46.0000 [IU] | Freq: Every day | SUBCUTANEOUS | Status: DC

## 2012-12-28 MED ORDER — TRAMADOL HCL 50 MG PO TABS: ORAL_TABLET | ORAL | Status: DC

## 2012-12-28 MED ORDER — "INSULIN SYRINGE-NEEDLE U-100 31G X 15/64"" 1 ML MISC": 1.0000 | Freq: Every day | Status: DC

## 2012-12-28 MED ORDER — ESOMEPRAZOLE MAGNESIUM 20 MG PO CPDR: 20.0000 mg | DELAYED_RELEASE_CAPSULE | Freq: Every day | ORAL | Status: DC

## 2012-12-28 MED ORDER — INSULIN REGULAR HUMAN 100 UNIT/ML IJ SOLN: INTRAMUSCULAR | Status: DC

## 2012-12-28 NOTE — Patient Instructions (Signed)
[name],  I am concerned about hypoglycemia (low blood sugars). We need to change your insulin regimen.   Please take 46 units of lantus at bedtime and 12 units of Novolin with every meal - breakfast, lunch and dinner. We will stop using the scale from today. Please do not skip meals.   Please come back for a visit after the holidays so we can fine tune your insulin regimen.  Thanks, [name] MD MPH 12/28/2012 11:02 AM      Low Blood Sugar Low blood sugar (hypoglycemia) means that the level of sugar in your blood is lower than it should be. Signs of low blood sugar include:  Getting sweaty.  Feeling hungry.  Feeling dizzy or weak.  Feeling sleepier than normal.  Feeling nervous.  Headaches.  Having a fast heartbeat. Low blood sugar can happen fast and can be an emergency. Your doctor can do tests to check your blood sugar level. You can have low blood sugar and not have diabetes. HOME CARE  Check your blood sugar as told by your doctor. If it is less than 70 mg/dl or as told by your doctor, take 1 of the following:  3 to 4 glucose tablets.   cup clear juice.   cup soda pop, not diet.  1 cup milk.  5 to 6 hard candies.  Recheck blood sugar after 15 minutes. Repeat until it is at the right level.  Eat a snack if it is more than 1 hour until the next meal.  Only take medicine as told by your doctor.  Do not skip meals. Eat on time.  Do not drink alcohol except with meals.  Check your blood glucose before driving.  Check your blood glucose before and after exercise.  Always carry treatment with you, such as glucose pills.  Always wear a medical alert bracelet if you have diabetes. GET HELP RIGHT AWAY IF:   Your blood glucose goes below 70 mg/dl or as told by your doctor, and you:  Are confused.  Are not able to swallow.  Pass out (faint).  You cannot treat yourself. You may need someone to help you.  You have low blood sugar  problems often.  You have problems from your medicines.  You are not feeling better after 3 to 4 days.  You have vision changes.

## 2012-12-28 NOTE — Progress Notes (Signed)
Subjective:     Patient ID: [name], female   DOB: 06-Apr-1940, 73 y.o.   MRN: [id number]  HPI Ms Buske follows up for her diabetes type 2 for which she is on 58 units of lantus and sliding scale insulin novolin. Her glucometer readings show a few hypoglycemic readings today. She has also lost 4 lbs since last visit. Overall her weight is down about 9 lbs since last year. She usually does not feel any symptoms of hypoglycemia, however she says this time she felt several times like she was shaky and felt nauseated.   Review of Systems  Constitutional: Negative for fever, chills, diaphoresis and fatigue.  Respiratory: Negative for cough and chest tightness.   Cardiovascular: Negative for chest pain and palpitations.  Gastrointestinal: Negative for nausea, diarrhea, constipation and abdominal distention.  Endocrine: Negative for polyphagia and polyuria.  Genitourinary: Negative for difficulty urinating.  Skin: Positive for rash (Has psoriasis).  Neurological: Negative for weakness, light-headedness and numbness.       Objective:   Physical Exam General: No acute distress.  HEENT: PERRL, EOMI.   CV: S1S2 RRR, no murmur Lungs: Bilateral Vesicular breath sounds.  Abdomen: Soft, non-tender, benign Pedal Edema: none Pedal pulses present.  Neuro: Alert and oriented times 3. No gross deficits seen.   Foot exam  Skin: normal, no wounds, no rashes, no calluses.  Deformity: none. Sensation: intact  Ankle jerk: present  Proprioception big toe: intact.  ROM around ankle joint and big toe normal.     Assessment & Plan:     Please see problem based charting.

## 2012-12-29 DIAGNOSIS — Z1211 Encounter for screening for malignant neoplasm of colon: Secondary | ICD-10-CM | POA: Insufficient documentation

## 2012-12-29 DIAGNOSIS — Z860100 Personal history of colon polyps, unspecified: Secondary | ICD-10-CM

## 2012-12-29 HISTORY — DX: Personal history of colon polyps, unspecified: Z86.0100

## 2012-12-29 NOTE — Assessment & Plan Note (Signed)
Stable, tramadol refill given.

## 2012-12-29 NOTE — Assessment & Plan Note (Addendum)
   Diabetic foot exam done.   Patient will follow up with her eye doctor soon for eye exam.  Flu shot taken by patient outside of clinic.  Mammogram - The patient had a mammogram done in Novemeber this year, however we still do not have any report in our records. I will look into this.

## 2012-12-29 NOTE — Assessment & Plan Note (Signed)
Stable, being mamaged by [employer]

## 2012-12-29 NOTE — Assessment & Plan Note (Signed)
Assessment: Hypoglycemia, multiple episodes, likely secondary to weight loss, chronic kidney disease and high basal dose of lantus.  Plan:  We decreased lantus to 46 units at bedtime from 58 units at bedtime. 46 units corresponds to about 70% of the patient's body weight (62.3) in kilograms. If the patient continues to have hypoglycemia, then we will decrease this further, close to 60% of the patient's body weight in kg, and increase the prandial insulin.    We removed sliding scale and asked the patient to take 12 units of novolin every visit. We might need to go up on this in the next visits.  The patient will follow up with the clinic or [facility] in a month or earlier if BS are consistently higher than 300. She has been educated that I am trying to let her have slightly higher blood sugars on purpose, for a few weeks and then we will start titrating her insulin again.   We did teachback and the patient could repeat what she needs to do with insulin.

## 2013-01-03 ENCOUNTER — Ambulatory Visit (INDEPENDENT_AMBULATORY_CARE_PROVIDER_SITE_OTHER): Payer: Medicare Other | Admitting: Emergency Medicine

## 2013-01-03 ENCOUNTER — Telehealth: Payer: Self-pay | Admitting: *Deleted

## 2013-01-03 VITALS — BP 122/76 | HR 86 | Temp 99.6°F | Resp 17 | Ht 60.5 in | Wt 130.0 lb

## 2013-01-03 DIAGNOSIS — J018 Other acute sinusitis: Secondary | ICD-10-CM

## 2013-01-03 DIAGNOSIS — J209 Acute bronchitis, unspecified: Secondary | ICD-10-CM

## 2013-01-03 DIAGNOSIS — J0100 Acute maxillary sinusitis, unspecified: Secondary | ICD-10-CM

## 2013-01-03 MED ORDER — CEFPROZIL 500 MG PO TABS: 500.0000 mg | ORAL_TABLET | Freq: Two times a day (BID) | ORAL | Status: AC

## 2013-01-03 MED ORDER — PROMETHAZINE-CODEINE 6.25-10 MG/5ML PO SYRP: 5.0000 mL | ORAL_SOLUTION | Freq: Four times a day (QID) | ORAL | Status: DC | PRN

## 2013-01-03 NOTE — Patient Instructions (Signed)
Coricidin HBP     Sinusitis Sinusitis is redness, soreness, and swelling (inflammation) of the paranasal sinuses. Paranasal sinuses are air pockets within the bones of your face (beneath the eyes, the middle of the forehead, or above the eyes). In healthy paranasal sinuses, mucus is able to drain out, and air is able to circulate through them by way of your nose. However, when your paranasal sinuses are inflamed, mucus and air can become trapped. This can allow bacteria and other germs to grow and cause infection. Sinusitis can develop quickly and last only a short time (acute) or continue over a long period (chronic). Sinusitis that lasts for more than 12 weeks is considered chronic.  CAUSES  Causes of sinusitis include:  Allergies.  Structural abnormalities, such as displacement of the cartilage that separates your nostrils (deviated septum), which can decrease the air flow through your nose and sinuses and affect sinus drainage.  Functional abnormalities, such as when the small hairs (cilia) that line your sinuses and help remove mucus do not work properly or are not present. SYMPTOMS  Symptoms of acute and chronic sinusitis are the same. The primary symptoms are pain and pressure around the affected sinuses. Other symptoms include:  Upper toothache.  Earache.  Headache.  Bad breath.  Decreased sense of smell and taste.  A cough, which worsens when you are lying flat.  Fatigue.  Fever.  Thick drainage from your nose, which often is green and may contain pus (purulent).  Swelling and warmth over the affected sinuses. DIAGNOSIS  Your caregiver will perform a physical exam. During the exam, your caregiver may:  Look in your nose for signs of abnormal growths in your nostrils (nasal polyps).  Tap over the affected sinus to check for signs of infection.  View the inside of your sinuses (endoscopy) with a special imaging device with a light attached (endoscope), which is  inserted into your sinuses. If your caregiver suspects that you have chronic sinusitis, one or more of the following tests may be recommended:  Allergy tests.  Nasal culture A sample of mucus is taken from your nose and sent to a lab and screened for bacteria.  Nasal cytology A sample of mucus is taken from your nose and examined by your caregiver to determine if your sinusitis is related to an allergy. TREATMENT  Most cases of acute sinusitis are related to a viral infection and will resolve on their own within 10 days. Sometimes medicines are prescribed to help relieve symptoms (pain medicine, decongestants, nasal steroid sprays, or saline sprays).  However, for sinusitis related to a bacterial infection, your caregiver will prescribe antibiotic medicines. These are medicines that will help kill the bacteria causing the infection.  Rarely, sinusitis is caused by a fungal infection. In theses cases, your caregiver will prescribe antifungal medicine. For some cases of chronic sinusitis, surgery is needed. Generally, these are cases in which sinusitis recurs more than 3 times per year, despite other treatments. HOME CARE INSTRUCTIONS   Drink plenty of water. Water helps thin the mucus so your sinuses can drain more easily.  Use a humidifier.  Inhale steam 3 to 4 times a day (for example, sit in the bathroom with the shower running).  Apply a warm, moist washcloth to your face 3 to 4 times a day, or as directed by your caregiver.  Use saline nasal sprays to help moisten and clean your sinuses.  Take over-the-counter or prescription medicines for pain, discomfort, or fever only as directed  by your caregiver. SEEK IMMEDIATE MEDICAL CARE IF:  You have increasing pain or severe headaches.  You have nausea, vomiting, or drowsiness.  You have swelling around your face.  You have vision problems.  You have a stiff neck.  You have difficulty breathing. MAKE SURE YOU:   Understand these  instructions.  Will watch your condition.  Will get help right away if you are not doing well or get worse. Document Released: 12/30/2004 Document Revised: 03/24/2011 Document Reviewed: 01/14/2011 [facility] Patient Information 2014 [place], [place]. Bronchitis Bronchitis is the body's way of reacting to injury and/or infection (inflammation) of the bronchi. Bronchi are the air tubes that extend from the windpipe into the lungs. If the inflammation becomes severe, it may cause shortness of breath. CAUSES  Inflammation may be caused by:  A virus.  Germs (bacteria).  Dust.  Allergens.  Pollutants and many other irritants. The cells lining the bronchial tree are covered with tiny hairs (cilia). These constantly beat upward, away from the lungs, toward the mouth. This keeps the lungs free of pollutants. When these cells become too irritated and are unable to do their job, mucus begins to develop. This causes the characteristic cough of bronchitis. The cough clears the lungs when the cilia are unable to do their job. Without either of these protective mechanisms, the mucus would settle in the lungs. Then you would develop pneumonia. Smoking is a common cause of bronchitis and can contribute to pneumonia. Stopping this habit is the single most important thing you can do to help yourself. TREATMENT   Your caregiver may prescribe an antibiotic if the cough is caused by bacteria. Also, medicines that open up your airways make it easier to breathe. Your caregiver may also recommend or prescribe an expectorant. It will loosen the mucus to be coughed up. Only take over-the-counter or prescription medicines for pain, discomfort, or fever as directed by your caregiver.  Removing whatever causes the problem (smoking, for example) is critical to preventing the problem from getting worse.  Cough suppressants may be prescribed for relief of cough symptoms.  Inhaled medicines may be prescribed to help with  symptoms now and to help prevent problems from returning.  For those with recurrent (chronic) bronchitis, there may be a need for steroid medicines. SEEK IMMEDIATE MEDICAL CARE IF:   During treatment, you develop more pus-like mucus (purulent sputum).  You have a fever.  You become progressively more ill.  You have increased difficulty breathing, wheezing, or shortness of breath. It is necessary to seek immediate medical care if you are elderly or sick from any other disease. MAKE SURE YOU:   Understand these instructions.  Will watch your condition.  Will get help right away if you are not doing well or get worse. Document Released: 12/30/2004 Document Revised: 09/01/2012 Document Reviewed: 08/24/2012 [facility] Patient Information 2014 [place].

## 2013-01-03 NOTE — Telephone Encounter (Signed)
Friend of pt - Wendi Snipes called - pt has had nonproductive cough and nausea sent 12/31/12. Not eating - pt is diabetic. Denies vomiting. Has not taken temp. There are no appts in clinic 01/03/13 and 01/04/13 - suggest for him to take pt to Urgent Care. Hilda Blades Lazlo Tunney RN 01/03/13 9:50AM

## 2013-01-03 NOTE — Progress Notes (Signed)
[facility] [address] [place] [address] [phone]  Date:  01/03/2013   Name:  [name]   DOB:  December 18, 1940   MRN:  [id number]  PCP:  [name], MD    Chief Complaint: Fatigue, Cough, Sinusitis and Nasal Congestion   History of Present Illness:  [name] is a 72 y.o. very pleasant female patient who presents with the following:  Ill since Friday with mucopurulent nasal and post nasal drainage.  Has productive cough with a mucopurulent sputum.  No wheezing or shortness of breath.  No nausea or vomiting.  No fever or chills.  No rash.  Has a sore throat from coughing.  Says cough worse at night when she lays down.  No improvement with over the counter medications or other home remedies. Denies other complaint or health concern today.   Patient Active Problem List   Diagnosis Date Noted  . Preventative health care 12/29/2012  . Heart failure, chronic systolic [id number]  . SPINAL STENOSIS, LUMBAR 07/23/2007  . HYPERTENSION 09/09/2006  . HIP PAIN, BILATERAL 03/03/2006  . DYSPHAGIA 03/03/2006  . OSTEOARTHRITIS 12/23/2005  . HYPERLIPIDEMIA 10/15/2005  . CORONARY ARTERY DISEASE 10/15/2005  . PERIPHERAL VASCULAR DISEASE 10/15/2005  . RENAL INSUFFICIENCY 10/15/2005  . PSORIASIS 10/15/2005  . OSTEOPENIA 10/15/2005  . DIABETES MELLITUS, TYPE II 01/14/2004    Past Medical History  Diagnosis Date  . DM (diabetes mellitus)     insulin dependent  . Lumbar spinal stenosis   . History of colonic polyps   . Diverticulosis   . Nephrolithiasis     hx of  . HTN (hypertension)   . HLD (hyperlipidemia)   . PVD (peripheral vascular disease)     ABI indicated moderated reduction arterial flow on the left.  Marland Kitchen CAD (coronary artery disease) 2006    s/p Quadrupal CABG 2006  . MI (myocardial infarction) 2003    . Fuig [place]  . Tricuspid regurgitation 2009    moderate to severe, EF 55%  . Psoriasis   . OA (osteoarthritis)   . CKD  (chronic kidney disease) stage 4, GFR 15-29 ml/min     fluctuating between stage 3 and 4 depending on GFR  . Osteopenia 2010    T score of -1.8  . CHF (congestive heart failure) 2010    EF 30-35% from Echo 06/2008  . MYOCARDIAL INFARCTION, HX OF 09/09/2006    Qualifier: Diagnosis of  By: Marinda Elk MD, Sonia Side    . DIVERTICULOSIS OF COLON 04/01/2007    Qualifier: Diagnosis of  By: Nelson-Smith CMA (AAMA), [name]    . NEPHROLITHIASIS 04/01/2007    Qualifier: Diagnosis of  By: Harlon Ditty CMA (AAMA), [name]    . Mouth ulcer adjacent to jaw bone 08/22/2010    S/p removal by ENT 09/2010     Past Surgical History  Procedure Laterality Date  . Laparoscopic supracervical hysterectomy    . Coronary artery bypass graft  2006    4 vessel  . Coronary angioplasty with stent placement  2003  . Cataract extraction  2008    left eye    History  Substance Use Topics  . Smoking status: Never Smoker   . Smokeless tobacco: Not on file  . Alcohol Use: No    Family History  Problem Relation Age of Onset  . Cirrhosis Brother     Allergies  Allergen Reactions  . Lisinopril Cough    Medication list has been reviewed and updated.  Current  Outpatient Prescriptions on File Prior to Visit  Medication Sig Dispense Refill  . atenolol (TENORMIN) 25 MG tablet Take 0.5 tablets (12.5 mg total) by mouth daily.  90 tablet  2  . betamethasone dipropionate 0.05 % lotion Apply topically 2 (two) times daily.  60 mL  0  . Calcium Carbonate-Vitamin D (CALCIUM-VITAMIN D) 500-200 MG-UNIT per tablet Take 1 tablet by mouth 2 (two) times daily with a meal.  180 tablet  6  . Clobetasol Prop Crea-Coal Tar 0.05 & 2.3 % KIT To apply twice daily on the affected area.  1 kit  0  . esomeprazole (NEXIUM) 20 MG capsule Take 1 capsule (20 mg total) by mouth daily before breakfast.  90 capsule  3  . furosemide (LASIX) 40 MG tablet Take 1 tablet (40 mg total) by mouth daily.  90 tablet  4  . glucose blood (BAYER CONTOUR TEST) test  strip Use to check blood sugar three times a day. Dx code 250.00 (on insulin)  300 each  3  . insulin glargine (LANTUS) 100 UNIT/ML injection Inject 0.46 mLs (46 Units total) into the skin at bedtime. 58 units at night  60 mL  2  . insulin regular (NOVOLIN R,HUMULIN R) 100 units/mL injection Take 12 units with breakfast, lunch and dinner. Do not skip meals.  60 mL  5  . Insulin Syringe-Needle U-100 31G X 15/64" 0.3 ML MISC 1 each by Does not apply route 3 (three) times daily before meals. Dx code 250.00 insulin requiring  300 each  3  . Insulin Syringe-Needle U-100 31G X 15/64" 1 ML MISC 1 each by Does not apply route daily. Use for lantus insulin once daily  100 each  3  . potassium chloride (K-DUR) 10 MEQ tablet Take 1 tablet (10 mEq total) by mouth 2 (two) times daily.  180 tablet  5  . rosuvastatin (CRESTOR) 10 MG tablet Take 1 tablet (10 mg total) by mouth daily.  90 tablet  3  . traMADol (ULTRAM) 50 MG tablet 50 mg every 12 hours as needed.  60 tablet  2  . valsartan (DIOVAN) 320 MG tablet Take 1 tablet (320 mg total) by mouth daily.  90 tablet  4   No current facility-administered medications on file prior to visit.    Review of Systems:  As per HPI, otherwise negative.    Physical Examination: Filed Vitals:   01/03/13 1222  BP: 122/76  Pulse: 86  Temp: 99.6 F (37.6 C)  Resp: 17   Filed Vitals:   01/03/13 1222  Height: 5' 0.5" (1.537 m)  Weight: 130 lb (58.968 kg)   Body mass index is 24.96 kg/(m^2). Ideal Body Weight: Weight in (lb) to have BMI = 25: 129.9  GEN: WDWN, NAD, Non-toxic, A & O x 3 HEENT: Atraumatic, Normocephalic. Neck supple. No masses, No LAD.  Purulent post nasal drainage Ears and Nose: No external deformity. CV: RRR, No M/G/R. No JVD. No thrill. No extra heart sounds. PULM: CTA B, no wheezes, crackles, rhonchi. No retractions. No resp. distress. No accessory muscle use. ABD: S, NT, ND, +BS. No rebound. No HSM. EXTR: No c/c/e NEURO Normal gait.   PSYCH: Normally interactive. Conversant. Not depressed or anxious appearing.  Calm demeanor.    Assessment and Plan: Sinusitis Bronchitis cefzil Phen c cod Coricidin HBP  Signed,  Ellison Carwin, MD

## 2013-01-04 ENCOUNTER — Other Ambulatory Visit: Payer: Self-pay | Admitting: *Deleted

## 2013-01-04 DIAGNOSIS — E119 Type 2 diabetes mellitus without complications: Secondary | ICD-10-CM

## 2013-01-04 MED ORDER — INSULIN GLARGINE 100 UNIT/ML ~~LOC~~ SOLN: 46.0000 [IU] | Freq: Every day | SUBCUTANEOUS | Status: DC

## 2013-01-04 NOTE — Telephone Encounter (Signed)
Per Dr. Delfino Lovett last note on 12/28/12: "We decreased lantus to 46 units at bedtime from 58 units at bedtime. 46 units corresponds to about 70% of the patient's body weight (62.3) in kilograms. If the patient continues to have hypoglycemia, then we will decrease this further, close to 60% of the patient's body weight in kg, and increase the prandial insulin. " Resent Rx with correct dose of 46 units of lantus at bedtime.

## 2013-01-04 NOTE — Telephone Encounter (Signed)
[facility] received rx w/directions for 46 units also for 58 units; please clarify  Thanks

## 2013-01-18 ENCOUNTER — Encounter: Payer: Self-pay | Admitting: Dietician

## 2013-01-18 ENCOUNTER — Ambulatory Visit (INDEPENDENT_AMBULATORY_CARE_PROVIDER_SITE_OTHER): Payer: Medicare HMO | Admitting: Internal Medicine

## 2013-01-18 ENCOUNTER — Encounter: Payer: Self-pay | Admitting: Internal Medicine

## 2013-01-18 ENCOUNTER — Telehealth: Payer: Self-pay | Admitting: Dietician

## 2013-01-18 ENCOUNTER — Ambulatory Visit (INDEPENDENT_AMBULATORY_CARE_PROVIDER_SITE_OTHER): Payer: Medicare HMO | Admitting: Dietician

## 2013-01-18 VITALS — BP 113/57 | HR 58 | Temp 97.9°F | Wt 137.5 lb

## 2013-01-18 DIAGNOSIS — L678 Other hair color and hair shaft abnormalities: Secondary | ICD-10-CM

## 2013-01-18 DIAGNOSIS — E119 Type 2 diabetes mellitus without complications: Secondary | ICD-10-CM

## 2013-01-18 DIAGNOSIS — M25559 Pain in unspecified hip: Secondary | ICD-10-CM

## 2013-01-18 DIAGNOSIS — L739 Follicular disorder, unspecified: Secondary | ICD-10-CM | POA: Insufficient documentation

## 2013-01-18 DIAGNOSIS — L738 Other specified follicular disorders: Secondary | ICD-10-CM

## 2013-01-18 MED ORDER — INSULIN REGULAR HUMAN 100 UNIT/ML IJ SOLN: INTRAMUSCULAR | Status: DC

## 2013-01-18 MED ORDER — INSULIN GLARGINE 100 UNIT/ML ~~LOC~~ SOLN: 44.0000 [IU] | Freq: Every day | SUBCUTANEOUS | Status: DC

## 2013-01-18 NOTE — Telephone Encounter (Signed)
Called office. They are faxing [place] patient's last eye exam record.

## 2013-01-18 NOTE — Assessment & Plan Note (Signed)
It appears the small bump on the lower posterior neck/upper back is likely folliculitis.  I advised warm compresses TID and not to break the skin.

## 2013-01-18 NOTE — Patient Instructions (Signed)
-  Regarding your diabetes, decrease your lantus to 44 units before bed and increase your novolin (mealtime insulin) to 13 units three times a day with meals.  Continue to check your blood sugar daily and if you feel funny.  -Regarding the bump on your back, please apply a warm compress 3 times a day and let's see if it resolves  Please be sure to bring all of your medications with you to every visit.  Should you have any new or worsening symptoms, please be sure to call the clinic at [phone].

## 2013-01-18 NOTE — Patient Instructions (Signed)
Try to eat about the same amount of carb servings for each meal and snack:   Eat about 3 carb choices at each meal and 1 for snacks    Examples:  2 starch + 1 fruit = 3 carb choices    1 starch + 1 milk + 1 fruit = 3 carb choices    2 starch + 1 milk = 3 carb choices    3 starches = 3 carb choices  Veggies and meats do not count- add them as needed.   Meal plan: Breakfast- 3 carb servings of your choice Lunch- 3 carb servings Dinner- 3 carb servings  Snack- 1 carb serving  Please make a follow up appointment for 3-4 weeks

## 2013-01-18 NOTE — Progress Notes (Signed)
Subjective:   Patient ID: [name] female   DOB: 1940/09/12 73 y.o.   MRN: [id number]  HPI: [name] is a 73 y.o. woman with h/o DM, HLD, HTN, CAD, lumbar spinal stenosis, and stage 3 CKD. Most recently, she was seen on 01/03/13 at urgent care for a URI/acute sinusitis/bronchitis which has subsequently improved.    She was most recently seen by her PCP on 12/28/12 for routine follow up, with episodes of hypoglycemia (thought to be d/t weight loss, CKD and high basal dose of lantus).  At that visit, lantus was decreased from 58--> 46u and removed SSI in place for 12u of Novolin TIDWC.  She did have a visit with Debera Lat today.    She has been taking novolin 12 TID and lantus 46u qHS.  Can't figure out why blood sugar was low this morning.  Did feel "funny" inside, but can't recall the last time this happened.  Is missing some novolin doses - maybe about once per week.   Review of patient's glucometer reveals 18 readings, with an avg reading of 130, at target 87.5% of the time, and below target 6.2% of the time.  Her lowest CBG was 58 (today at 8:13a - corrected with OJ, from overnight lantus, did not have a light dinner last night at around 6p, cannot ID why this sugar was so low) and her highest reading was 254 (at 12:44p on 01/14/13)  Review of Systems: Constitutional: Denies fever, chills, diaphoresis, describes appetite as "soso" HEENT: Denies photophobia, eye pain, redness, hearing loss, ear pain, congestion, sore throat, rhinorrhea, sneezing, mouth sores, trouble swallowing, neck pain, neck stiffness and tinnitus.  Respiratory: Denies SOB, DOE, cough, chest tightness, and wheezing.  Cardiovascular: Denies chest pain, palpitations and leg swelling.  Gastrointestinal: Denies nausea, vomiting, abdominal pain, diarrhea, constipation,blood in stool and abdominal distention.  Genitourinary: Denies dysuria, urgency, frequency, hematuria, flank pain and difficulty urinating.    Musculoskeletal: Denies myalgias, back pain, joint swelling, arthralgias and gait problem.  Skin: Denies pallor, rash.  C/o small bump on upper back- never before, not painful/pruritic Neurological: Denies dizziness, seizures, syncope, weakness, lightheadedness, numbness and headaches.    Past Medical History  Diagnosis Date  . DM (diabetes mellitus)     insulin dependent  . Lumbar spinal stenosis   . History of colonic polyps   . Diverticulosis   . Nephrolithiasis     hx of  . HTN (hypertension)   . HLD (hyperlipidemia)   . PVD (peripheral vascular disease)     ABI indicated moderated reduction arterial flow on the left.  Marland Kitchen CAD (coronary artery disease) 2006    s/p Quadrupal CABG 2006  . MI (myocardial infarction) 2003    . [name]  . Tricuspid regurgitation 2009    moderate to severe, EF 55%  . Psoriasis   . OA (osteoarthritis)   . CKD (chronic kidney disease) stage 4, GFR 15-29 ml/min     fluctuating between stage 3 and 4 depending on GFR  . Osteopenia 2010    T score of -1.8  . CHF (congestive heart failure) 2010    EF 30-35% from Echo 06/2008  . MYOCARDIAL INFARCTION, HX OF 09/09/2006    Qualifier: Diagnosis of  By: Marinda Elk MD, Sonia Side    . DIVERTICULOSIS OF COLON 04/01/2007    Qualifier: Diagnosis of  By: Nelson-Smith CMA (AAMA), [name]    . NEPHROLITHIASIS 04/01/2007    Qualifier: Diagnosis of  By: Harlon Ditty CMA (AAMA), [name]    .  Mouth ulcer adjacent to jaw bone 08/22/2010    S/p removal by ENT 09/2010    Current Outpatient Prescriptions  Medication Sig Dispense Refill  . atenolol (TENORMIN) 25 MG tablet Take 0.5 tablets (12.5 mg total) by mouth daily.  90 tablet  2  . betamethasone dipropionate 0.05 % lotion Apply topically 2 (two) times daily.  60 mL  0  . Calcium Carbonate-Vitamin D (CALCIUM-VITAMIN D) 500-200 MG-UNIT per tablet Take 1 tablet by mouth 2 (two) times daily with a meal.  180 tablet  6  . Clobetasol Prop Crea-Coal Tar 0.05 & 2.3 % KIT To apply  twice daily on the affected area.  1 kit  0  . esomeprazole (NEXIUM) 20 MG capsule Take 1 capsule (20 mg total) by mouth daily before breakfast.  90 capsule  3  . furosemide (LASIX) 40 MG tablet Take 1 tablet (40 mg total) by mouth daily.  90 tablet  4  . glucose blood (BAYER CONTOUR TEST) test strip Use to check blood sugar three times a day. Dx code 250.00 (on insulin)  300 each  3  . insulin glargine (LANTUS) 100 UNIT/ML injection Inject 0.46 mLs (46 Units total) into the skin at bedtime.  60 mL  2  . insulin regular (NOVOLIN R,HUMULIN R) 100 units/mL injection Take 12 units with breakfast, lunch and dinner. Do not skip meals.  60 mL  5  . Insulin Syringe-Needle U-100 31G X 15/64" 0.3 ML MISC 1 each by Does not apply route 3 (three) times daily before meals. Dx code 250.00 insulin requiring  300 each  3  . Insulin Syringe-Needle U-100 31G X 15/64" 1 ML MISC 1 each by Does not apply route daily. Use for lantus insulin once daily  100 each  3  . potassium chloride (K-DUR) 10 MEQ tablet Take 1 tablet (10 mEq total) by mouth 2 (two) times daily.  180 tablet  5  . promethazine-codeine (PHENERGAN WITH CODEINE) 6.25-10 MG/5ML syrup Take 5 mLs by mouth every 6 (six) hours as needed for cough.  120 mL  0  . rosuvastatin (CRESTOR) 10 MG tablet Take 1 tablet (10 mg total) by mouth daily.  90 tablet  3  . traMADol (ULTRAM) 50 MG tablet 50 mg every 12 hours as needed.  60 tablet  2  . valsartan (DIOVAN) 320 MG tablet Take 1 tablet (320 mg total) by mouth daily.  90 tablet  4   No current facility-administered medications for this visit.   Family History  Problem Relation Age of Onset  . Cirrhosis Brother    History   Social History  . Marital Status: Widowed    Spouse Name: N/A    Number of Children: N/A  . Years of Education: N/A   Social History Main Topics  . Smoking status: Never Smoker   . Smokeless tobacco: None  . Alcohol Use: No  . Drug Use: No  . Sexual Activity: None   Other Topics  Concern  . None   Social History Narrative   Retired Event organiser   Widow/Widower   lives in [place]   4 kids (2 sons, 2 daughters) one son died many years ago in car accident.  other kids are all in [place].   Has friend that helps take care of her    Objective:  Physical Exam: Filed Vitals:   01/18/13 0930  BP: 113/57  Pulse: 58  Temp: 97.9 F (36.6 C)  TempSrc: Oral  Weight: 137  lb 8 oz (62.37 kg)  SpO2: 97%   General: no distress, appears as stated age HEENT: PERRL, EOMI, no scleral icterus Cardiac: RRR, no rubs, murmurs or gallops Pulm: clear to auscultation bilaterally, moving normal volumes of air Abd: soft, nontender, nondistended, BS present Ext: warm and well perfused, 1+ pitting pedal edema Skin: 1cm diameter papule, not erythematous or fluctuant with a black head Neuro: alert and oriented X3, cranial nerves II-XII grossly intact  Assessment & Plan:  Case and care discussed with Dr. Marinda Elk.  Please see problem oriented charting for further details. Patient to return in 3 months for routine DM follow up with PCP, sooner if further hypoglycemic episodes.

## 2013-01-18 NOTE — Progress Notes (Signed)
Medical Nutrition Therapy:  Appt start time: 0930 end time:  1030.  Assessment:  Primary concerns today: Blood sugar control and Meal planning.  Patient here with supportive significant other who also has Type 2 diabetes and is aware of carb content of foods. Her physician had concern about hypoglycemia unawareness and thus was wanting her blood sugars to be a bit higher for a short period of time to try to help her regain symptoms of hypoglycemia. She had a symptomatic low blood sugar of 58 this am and blames it on not having a bedtime snack. Says she feels like she is having a low blood sugar when out during the day and has not eaten also. Skips her meal time Novolog when she doesn't check her blood sugar which was twice a day on 11 days of the past 13 days. Because of this her current total daily dose is ~60 units a day. Calculated TDD is ~ 33-73 units/day Usual eating pattern includes 2-3 meals and 0-1 snacks per day. Frequent foods include fruit or ice cream.  Avoided foods include bread, yogurt.   24-hr recall: B ( AM)-  1/2 banana, 1-1/2 cups cornflakes, 1/2 c milk . 1 cup coffee ( ~ 3 carbs)  L ( PM)- soup and crackers, water and fruit ( ~ 3 carbs) D ( PM)- soup and crackers, water or cooked meal with meat, starch and veggies twice a week ( 2-3 carbs) Snk ( PM)- popcorn or ice cream or ice cream sandwich ( 1-2 carbs)  Usual physical activity includes walks occassionally. Blood sugars- checks 1.3 times a day. Average is ~ 115mg /dl, CBG in office 227 after a banana, orange juice, some corn flakes and 12 units of insulin  Progress Towards Goal(s):  In progress.   Nutritional Diagnosis:  NB-1.1 Food and nutrition-related knowledge deficit As related to lack of readiness to learn about carb consistency in past.  As evidenced by her report and stating " I can't learn as good as he can".    Intervention:  Nutrition education about carb consistency/counting. Coordination of care: Recommend  decreasing lantus dose. With current TDD her basal dose should be ~ 30 units and meal time at breakfast ~9 units. Consider trial of meal time prandin for meals where Novolog is skipped.   patient is only taking 12 units novolog with breakfast most days and the 2 days she took it 3 times a day her next morning fasting was too low indicating too much insulin on board. Could consider trial of prandin with meals since she eats out or forgets insulin at lunch and dinner most days(11/13 of most recent days).    Monitoring/Evaluation:  Dietary intake, exercise, meter,blood sugars, and body weight in 4 week(s). Marland Kitchen

## 2013-01-18 NOTE — Assessment & Plan Note (Signed)
Patient reports that tramadol is effective for hip pain but not for right shoulder pain.  She also is concerned about tramadol causing hypoglycemia.  While I believe her hypoglycemia is secondary to excess insulin in the setting of decreased appetite and CKD, I advised that she may do a trial off of tramadol and substitute tylenol 1000mg  TID scheduled (may do daily or BID if she prefers, but reiterated that it must be scheduled.)

## 2013-01-18 NOTE — Assessment & Plan Note (Addendum)
Lab Results  Component Value Date   HGBA1C 8.1 12/28/2012   HGBA1C 8.5 06/15/2012   HGBA1C 8.7 02/03/2012     Assessment: Diabetes control:  improved Progress toward A1C goal:   almost at goal Comments: goal <8  Plan: Medications:  Given that she is on a beta blocker and CBBG of 58 this morning, I will decrease lantus to 44u qHS and increase novolin to 13u TIDWC (increase net of 1 unit, but will be covered by food) - other CBGs were within normal range, and she notes this was her first hypoglycemic event in a long time; also, she reports that on average she misses her meal time insulin about once per week, but does not feel that QID insulin impacts her quality of life negatively Home glucose monitoring: Frequency:   Timing:   Instruction/counseling given: reminded to bring blood glucose meter & log to each visit and reminded to bring medications to each visit

## 2013-01-19 LAB — GLUCOSE, CAPILLARY: Glucose-Capillary: 227 mg/dL — ABNORMAL HIGH (ref 70–99)

## 2013-01-20 NOTE — Progress Notes (Signed)
Case discussed with Dr. Sharda soon after the resident saw the patient.  We reviewed the resident's history and exam and pertinent patient test results.  I agree with the assessment, diagnosis, and plan of care documented in the resident's note. 

## 2013-02-11 ENCOUNTER — Other Ambulatory Visit: Payer: Self-pay | Admitting: *Deleted

## 2013-02-11 DIAGNOSIS — E785 Hyperlipidemia, unspecified: Secondary | ICD-10-CM

## 2013-02-11 DIAGNOSIS — I10 Essential (primary) hypertension: Secondary | ICD-10-CM

## 2013-02-11 DIAGNOSIS — K219 Gastro-esophageal reflux disease without esophagitis: Secondary | ICD-10-CM

## 2013-02-11 DIAGNOSIS — E119 Type 2 diabetes mellitus without complications: Secondary | ICD-10-CM

## 2013-02-11 NOTE — Telephone Encounter (Signed)
All of these appear to have been refilled 12/14 or 1/15 so should have refills.

## 2013-02-11 NOTE — Telephone Encounter (Signed)
Pt states she has changed insurance and pharmacy. Thanks

## 2013-02-14 NOTE — Telephone Encounter (Signed)
Pt has changed pharmacy; need new rxs. Thanks

## 2013-02-17 MED ORDER — INSULIN REGULAR HUMAN 100 UNIT/ML IJ SOLN: INTRAMUSCULAR | Status: DC

## 2013-02-17 MED ORDER — TRAMADOL HCL 50 MG PO TABS: ORAL_TABLET | ORAL | Status: DC

## 2013-02-17 MED ORDER — ESOMEPRAZOLE MAGNESIUM 20 MG PO CPDR: 20.0000 mg | DELAYED_RELEASE_CAPSULE | Freq: Every day | ORAL | Status: DC

## 2013-02-17 MED ORDER — ROSUVASTATIN CALCIUM 10 MG PO TABS: 10.0000 mg | ORAL_TABLET | Freq: Every day | ORAL | Status: DC

## 2013-02-17 MED ORDER — VALSARTAN 320 MG PO TABS: 320.0000 mg | ORAL_TABLET | Freq: Every day | ORAL | Status: DC

## 2013-02-17 NOTE — Telephone Encounter (Signed)
Tramadol rx called to [facility].

## 2013-03-17 ENCOUNTER — Telehealth: Payer: Self-pay | Admitting: *Deleted

## 2013-03-17 DIAGNOSIS — I10 Essential (primary) hypertension: Secondary | ICD-10-CM

## 2013-03-17 DIAGNOSIS — E785 Hyperlipidemia, unspecified: Secondary | ICD-10-CM

## 2013-03-17 DIAGNOSIS — E119 Type 2 diabetes mellitus without complications: Secondary | ICD-10-CM

## 2013-03-17 MED ORDER — ROSUVASTATIN CALCIUM 10 MG PO TABS: 10.0000 mg | ORAL_TABLET | Freq: Every day | ORAL | Status: DC

## 2013-03-17 MED ORDER — NITROGLYCERIN 0.4 MG SL SUBL: 0.4000 mg | SUBLINGUAL_TABLET | SUBLINGUAL | Status: DC | PRN

## 2013-03-17 MED ORDER — INSULIN GLARGINE 100 UNIT/ML ~~LOC~~ SOLN: 44.0000 [IU] | Freq: Every day | SUBCUTANEOUS | Status: DC

## 2013-03-17 MED ORDER — "INSULIN SYRINGE-NEEDLE U-100 31G X 15/64"" 0.3 ML MISC": 1.0000 | Freq: Three times a day (TID) | Status: DC

## 2013-03-17 MED ORDER — ATENOLOL 25 MG PO TABS: 12.5000 mg | ORAL_TABLET | Freq: Every day | ORAL | Status: DC

## 2013-03-17 MED ORDER — FUROSEMIDE 40 MG PO TABS: 40.0000 mg | ORAL_TABLET | Freq: Every day | ORAL | Status: DC

## 2013-03-17 MED ORDER — "INSULIN SYRINGE-NEEDLE U-100 31G X 15/64"" 1 ML MISC": 1.0000 | Freq: Every day | Status: DC

## 2013-03-17 MED ORDER — POTASSIUM CHLORIDE ER 10 MEQ PO TBCR: 10.0000 meq | EXTENDED_RELEASE_TABLET | Freq: Two times a day (BID) | ORAL | Status: DC

## 2013-03-17 NOTE — Addendum Note (Signed)
Addended by: [name] on: 03/17/2013 05:02 PM   Modules accepted: Orders

## 2013-03-17 NOTE — Telephone Encounter (Signed)
Needs refill on nitrostat, not using but wants it on hand.

## 2013-03-18 ENCOUNTER — Other Ambulatory Visit: Payer: Self-pay | Admitting: *Deleted

## 2013-03-18 DIAGNOSIS — E119 Type 2 diabetes mellitus without complications: Secondary | ICD-10-CM

## 2013-03-18 NOTE — Telephone Encounter (Signed)
Pt requesting lancets; 90 day supply.  Thanks

## 2013-03-22 MED ORDER — GLUCOSE BLOOD VI STRP: ORAL_STRIP | Status: DC

## 2013-03-22 MED ORDER — LANCETS MISC: Status: DC

## 2013-03-22 NOTE — Telephone Encounter (Signed)
Can u re send Lancet rx electronically.  Thanks

## 2013-03-22 NOTE — Addendum Note (Signed)
Addended by: Ebbie Latus on: 03/22/2013 01:43 PM   Modules accepted: Orders

## 2013-03-24 ENCOUNTER — Telehealth: Payer: Self-pay | Admitting: *Deleted

## 2013-03-24 MED ORDER — INSULIN GLARGINE 100 UNIT/ML SOLOSTAR PEN: 44.0000 [IU] | PEN_INJECTOR | Freq: Every day | SUBCUTANEOUS | Status: DC

## 2013-03-24 MED ORDER — INSULIN PEN NEEDLE 31G X 5 MM MISC: Status: DC

## 2013-03-24 NOTE — Telephone Encounter (Signed)
Thanks, done.

## 2013-03-24 NOTE — Telephone Encounter (Signed)
Fax from [facility] - requesting clarification on Lantus whether pt uses Lantus vials or Lantus solostar pens. Called pt - stated she wants to change back to Lantus solostar pens. Also needs rx for pen needles. Last order was for vials not pens. Stated Lantus was decreased from 44 units to 43units. Thnaks

## 2013-03-24 NOTE — Telephone Encounter (Signed)
[facility] informed of Lancet rx,

## 2013-04-19 ENCOUNTER — Other Ambulatory Visit: Payer: Self-pay | Admitting: Dietician

## 2013-04-19 ENCOUNTER — Ambulatory Visit (INDEPENDENT_AMBULATORY_CARE_PROVIDER_SITE_OTHER): Payer: Commercial Managed Care - HMO | Admitting: Internal Medicine

## 2013-04-19 VITALS — BP 140/66 | HR 58 | Temp 97.9°F | Wt 140.2 lb

## 2013-04-19 DIAGNOSIS — E785 Hyperlipidemia, unspecified: Secondary | ICD-10-CM

## 2013-04-19 DIAGNOSIS — M25511 Pain in right shoulder: Secondary | ICD-10-CM | POA: Insufficient documentation

## 2013-04-19 DIAGNOSIS — Z0000 Encounter for general adult medical examination without abnormal findings: Secondary | ICD-10-CM

## 2013-04-19 DIAGNOSIS — E119 Type 2 diabetes mellitus without complications: Secondary | ICD-10-CM

## 2013-04-19 DIAGNOSIS — M19219 Secondary osteoarthritis, unspecified shoulder: Secondary | ICD-10-CM | POA: Insufficient documentation

## 2013-04-19 DIAGNOSIS — M25519 Pain in unspecified shoulder: Secondary | ICD-10-CM

## 2013-04-19 DIAGNOSIS — I10 Essential (primary) hypertension: Secondary | ICD-10-CM

## 2013-04-19 LAB — POCT GLYCOSYLATED HEMOGLOBIN (HGB A1C): Hemoglobin A1C: 8

## 2013-04-19 LAB — GLUCOSE, CAPILLARY: Glucose-Capillary: 234 mg/dL — ABNORMAL HIGH (ref 70–99)

## 2013-04-19 MED ORDER — GLUCOSE BLOOD VI STRP: ORAL_STRIP | Status: DC

## 2013-04-19 MED ORDER — INSULIN PEN NEEDLE 32G X 4 MM MISC: Status: DC

## 2013-04-19 MED ORDER — ACCU-CHEK FASTCLIX LANCETS MISC: Status: DC

## 2013-04-19 MED ORDER — ACCU-CHEK NANO SMARTVIEW W/DEVICE KIT: PACK | Status: DC

## 2013-04-19 MED ORDER — "INSULIN SYRINGE-NEEDLE U-100 31G X 15/64"" 0.3 ML MISC": 1.0000 | Freq: Three times a day (TID) | Status: DC

## 2013-04-19 NOTE — Patient Instructions (Signed)
[name],  It was a pleasure to see you today.  Your blood pressure is doing well. Please keep taking your medications.  Your diabetes number HbA1c is 8.0 today. It is getting better from before. Your blood sugars are okay - and I will not make any changes to your insulin regimen today. I have given you the refills on the needles that you requested.  Please schedule your yearly eye exam with your eye doctor.  We discussed your shoulder pain and you will consider a sports medicine referral, if the copay falls under your budget.  Thanks, [name] Fireman MD MPH 04/19/2013 11:53 AM

## 2013-04-19 NOTE — Assessment & Plan Note (Signed)
Health Maintenance Due  Topic Date Due  . Ophthalmology Exam  10/30/2012  . Lipid Panel  02/02/2013  . Hemoglobin A1c  03/28/2013   Plan:  The patient has been encouraged to contact eye care provider for annual check up.  Lipid panel would be done next visit. A1c doen today: 8.0

## 2013-04-19 NOTE — Assessment & Plan Note (Signed)
Assessment: Well controlled for patient goals (My goal HbA1c for Ms Demchak is 7.5-8.0)  HGBA1C 8.0 04/19/2013  HGBA1C 8.1 12/28/2012  HGBA1C 8.5 06/15/2012   Plan:   We had been struggling with occasional hypoglycemia with this patient. It seems like with 44 units of lantus and 13 units of Novolin that seems to have resolved. Her lunchtime blood sugars are high. However, I will not make any changes this time given the dietary indiscretions that might have happened on her [place] trip. I have asked the patient to return in 3 months and reassess. It might be more effective to change her meal time insulin next times, since she seems to be doing well with this dose of Lantus (not have hypoglycemia).  I refilled some of her syriges and needles today (She takes flexpen for lantus and syringes/vials for levemir)  She needs a new glucometer, and RN Ulis Rias is working with her pharmacy to get her one.

## 2013-04-19 NOTE — Assessment & Plan Note (Signed)
Assessment: Lab Results  Component Value Date   CHOL 176 02/03/2012   HDL 44 02/03/2012   LDLCALC 82 02/03/2012   TRIG 249* 02/03/2012   CHOLHDL 4.0 02/03/2012   On crestor 10.   Plan:  We will continue the current regimen.  Plan to check lipids next visit.

## 2013-04-19 NOTE — Assessment & Plan Note (Signed)
Assessment: Likely osteoarthritic changes, or adhesive capsulitis.   Plan:  Sports medicine referral suggested by me, however the patient does not want to pay a $45 copay. She says she will manage for now with conservative measures.   Reminded to avoid NSAIDs.

## 2013-04-19 NOTE — Progress Notes (Signed)
Subjective:     Patient ID: [name], female   DOB: [birth year], 73 y.o.   MRN: [id number]  HPI [name] comes in for her routine diabetic follow up. She comes in with her living partner as usual.   Diabetes Mellitus She is compliant with her insulin, and recently we have been playing with her dosages to avoid low blood sugars that she had been frequently having. She has brought her glucometer log today. Total 27 readings, morning readings 92% within range, lunch readings 40% within range; 57% within range - No low blood sugar readings. The patient was on a [place] trip last week of March, and most of her high readings are during that period.   Shoulder Pain, right Complains of new shoulder pain, which has come up in the past 6 months. She reports injury to the shoulder 20 years back, however was treated for it and did not have any pain until about 6 months ago. She has pain in moving the joint in all directions. She does not remember any repeat injury to the joint.   Hypertension She is compliant with all her medications and she follows with [facility] for that.  Chronic Kidney Disease Follows with [facility].   Review of Systems  Constitutional: Negative for chills, activity change, fatigue and unexpected weight change.  Respiratory: Negative for cough, shortness of breath and wheezing.   Cardiovascular: Negative for chest pain and leg swelling.  Gastrointestinal: Negative for diarrhea and constipation.  Genitourinary: Negative.   Musculoskeletal: Positive for arthralgias.  Skin: Negative for color change.  Neurological: Negative for dizziness, syncope and light-headedness.       Objective:   Physical Exam  Constitutional: She is oriented to person, place, and time. She appears well-developed and well-nourished. No distress.  HENT:  Mouth/Throat: Oropharynx is clear and moist.  Eyes: Conjunctivae and EOM are normal. Pupils are equal, round, and reactive  to light.  Neck: Normal range of motion.  Cardiovascular: Normal rate, regular rhythm, normal heart sounds and intact distal pulses.   Pulmonary/Chest: Effort normal and breath sounds normal. No respiratory distress. She has no wheezes. She has no rales. She exhibits no tenderness.  Abdominal: Soft. Bowel sounds are normal.  Musculoskeletal:       Arms: Neurological: She is alert and oriented to person, place, and time.  Skin: Skin is warm.  Psoriatic rashes on extensor surface of elbows.       Assessment & Plan:     Please see problem based charting.

## 2013-04-19 NOTE — Assessment & Plan Note (Addendum)
Assessment:  BP Readings from Last 3 Encounters:  04/19/13 140/66  01/18/13 113/57  01/03/13 122/76   Patient currently on Atenolol, diovan, lasix.   Plan:  We will continue current regimen

## 2013-04-19 NOTE — Telephone Encounter (Addendum)
Per [facility]/ [facility] pharmacy their preferred meter is accu nano or True Result but [name] is covered. Patient wants meter that we can download that is the lowest cost to her. Suggest accu chek nano, smartview test strips and fastclick lancets

## 2013-06-22 ENCOUNTER — Other Ambulatory Visit: Payer: Self-pay | Admitting: Dietician

## 2013-06-22 DIAGNOSIS — E119 Type 2 diabetes mellitus without complications: Secondary | ICD-10-CM

## 2013-06-22 MED ORDER — INSULIN PEN NEEDLE 32G X 4 MM MISC: Status: DC

## 2013-06-22 MED ORDER — "INSULIN SYRINGE-NEEDLE U-100 31G X 15/64"" 0.3 ML MISC": Status: DC

## 2013-06-22 NOTE — Telephone Encounter (Signed)
Please send the syringes Rx to [facility] per patient request.

## 2013-06-22 NOTE — Addendum Note (Signed)
Addended by: Resa Miner on: 06/22/2013 04:58 PM   Modules accepted: Orders

## 2013-06-22 NOTE — Telephone Encounter (Signed)
Patient wants pen needle rx sent to [facility] and syringe rx sent to [facility]. (Triage nurse verified with [facility] that they  do not carry the 15/64" syringes that she wants). I am told that this can be done in one note by sending one Rx at a time with different pharmacies.

## 2013-06-23 NOTE — Telephone Encounter (Signed)
[facility] called - already has rx for pen needles. Syringe rx called to [facility].

## 2013-09-13 ENCOUNTER — Ambulatory Visit: Payer: Medicare HMO | Admitting: Internal Medicine

## 2013-09-27 ENCOUNTER — Ambulatory Visit (INDEPENDENT_AMBULATORY_CARE_PROVIDER_SITE_OTHER): Payer: Medicare HMO | Admitting: Internal Medicine

## 2013-09-27 ENCOUNTER — Encounter: Payer: Self-pay | Admitting: Internal Medicine

## 2013-09-27 VITALS — BP 135/57 | HR 54 | Temp 97.4°F | Ht 60.0 in | Wt 140.1 lb

## 2013-09-27 DIAGNOSIS — N1831 Chronic kidney disease, stage 3a: Secondary | ICD-10-CM

## 2013-09-27 DIAGNOSIS — N183 Chronic kidney disease, stage 3 unspecified: Secondary | ICD-10-CM

## 2013-09-27 DIAGNOSIS — Z23 Encounter for immunization: Secondary | ICD-10-CM

## 2013-09-27 DIAGNOSIS — E785 Hyperlipidemia, unspecified: Secondary | ICD-10-CM

## 2013-09-27 DIAGNOSIS — I129 Hypertensive chronic kidney disease with stage 1 through stage 4 chronic kidney disease, or unspecified chronic kidney disease: Secondary | ICD-10-CM

## 2013-09-27 DIAGNOSIS — E119 Type 2 diabetes mellitus without complications: Secondary | ICD-10-CM

## 2013-09-27 DIAGNOSIS — E1169 Type 2 diabetes mellitus with other specified complication: Secondary | ICD-10-CM

## 2013-09-27 DIAGNOSIS — N1832 Chronic kidney disease, stage 3b: Secondary | ICD-10-CM | POA: Insufficient documentation

## 2013-09-27 DIAGNOSIS — E11649 Type 2 diabetes mellitus with hypoglycemia without coma: Secondary | ICD-10-CM

## 2013-09-27 DIAGNOSIS — Z0000 Encounter for general adult medical examination without abnormal findings: Secondary | ICD-10-CM

## 2013-09-27 DIAGNOSIS — K219 Gastro-esophageal reflux disease without esophagitis: Secondary | ICD-10-CM

## 2013-09-27 DIAGNOSIS — N184 Chronic kidney disease, stage 4 (severe): Secondary | ICD-10-CM

## 2013-09-27 DIAGNOSIS — L989 Disorder of the skin and subcutaneous tissue, unspecified: Secondary | ICD-10-CM

## 2013-09-27 DIAGNOSIS — L821 Other seborrheic keratosis: Secondary | ICD-10-CM | POA: Insufficient documentation

## 2013-09-27 DIAGNOSIS — D492 Neoplasm of unspecified behavior of bone, soft tissue, and skin: Secondary | ICD-10-CM

## 2013-09-27 DIAGNOSIS — I10 Essential (primary) hypertension: Secondary | ICD-10-CM

## 2013-09-27 HISTORY — DX: Chronic kidney disease, stage 3b: N18.32

## 2013-09-27 HISTORY — DX: Chronic kidney disease, stage 3a: N18.31

## 2013-09-27 HISTORY — DX: Chronic kidney disease, stage 3 unspecified: N18.30

## 2013-09-27 LAB — BASIC METABOLIC PANEL WITH GFR
BUN: 27 mg/dL — AB (ref 6–23)
CHLORIDE: 97 meq/L (ref 96–112)
CO2: 34 mEq/L — ABNORMAL HIGH (ref 19–32)
CREATININE: 1.44 mg/dL — AB (ref 0.50–1.10)
Calcium: 9.6 mg/dL (ref 8.4–10.5)
GFR, EST NON AFRICAN AMERICAN: 36 mL/min — AB
GFR, Est African American: 41 mL/min — ABNORMAL LOW
Glucose, Bld: 225 mg/dL — ABNORMAL HIGH (ref 70–99)
Potassium: 4.3 mEq/L (ref 3.5–5.3)
Sodium: 138 mEq/L (ref 135–145)

## 2013-09-27 LAB — GLUCOSE, CAPILLARY: GLUCOSE-CAPILLARY: 280 mg/dL — AB (ref 70–99)

## 2013-09-27 LAB — LIPID PANEL
Cholesterol: 182 mg/dL (ref 0–200)
HDL: 43 mg/dL (ref >39)
LDL Cholesterol: 80 mg/dL (ref 0–99)
TRIGLYCERIDES: 297 mg/dL — AB (ref <150)
Total CHOL/HDL Ratio: 4.2 Ratio
VLDL: 59 mg/dL — AB (ref 0–40)

## 2013-09-27 LAB — POCT GLYCOSYLATED HEMOGLOBIN (HGB A1C): Hemoglobin A1C: 7.1

## 2013-09-27 MED ORDER — TRAMADOL HCL 50 MG PO TABS: ORAL_TABLET | ORAL | Status: DC

## 2013-09-27 MED ORDER — ESOMEPRAZOLE MAGNESIUM 20 MG PO CPDR
20.0000 mg | DELAYED_RELEASE_CAPSULE | Freq: Every day | ORAL | Status: DC
Start: 2013-09-27 — End: 2014-03-30

## 2013-09-27 MED ORDER — VALSARTAN 320 MG PO TABS: 320.0000 mg | ORAL_TABLET | Freq: Every day | ORAL | Status: DC

## 2013-09-27 NOTE — Assessment & Plan Note (Signed)
Well controlled 

## 2013-09-27 NOTE — Progress Notes (Signed)
Subjective:     Patient ID: [name], female   DOB: 1940-09-12, 73 y.o.   MRN: [id number]  HPI [name] follows up for DM2, CKD4, HTN, extensive psoriasis and osteoarthritis. She also has a history of sCHF, and CAD s/p CABG in 2006.   Diabetes type 2 - [name] checks her BS three times a day and has brought her glucometer log. It shows about 70 readings, 16 within target of less than 140, 3 are hypoglycemic, and and about 10% are above 240. She is currently taking 44 units of Lantus at night and 13-20 units of Novolog at mealtime. She says that when she finds her before dinner blood sugars >200 she gives herself 20 units of novolog. Her hypoglycemic readings are all before dinner readings. She is not sure if she feels symptoms of hypoglycemia.   New Complaint - A skin growth and hyperpigmented patch seen below right ear with melanotic changes. The patient is unaware if it was present in the last sessions. I have seen it for the first time.   New Complaint - Transient headache experienced by the patient since the past 1 week, behind the right ear. It happens a few times a day, lasts a few seconds. It does not happen everyday, but has happened on 1-2 days in the past week. This is not present today. Its sharp, not associated with any aggravating or alleviating factors. She denies pain or numbness on the face, no tenderness over temple, no autonomic symptoms, she does not have any dental or ear infections, no fever. She endorses using a cue tip to clean her ears. She denies any weakness, slurred speech or loss of consciousness. She has no dizziness or lightheadedness. She denies nausea, vomiting, loss of appetite or any visual disturbances.  Medication compliance - excellent.   Psoriasis, uncontrolled - All over her body and scalp. Her previous dermatologist stopped seeing her because they do not accept [name].   Review of Systems  Constitutional: Negative.   HENT: Negative for  dental problem, ear discharge, ear pain, facial swelling, hearing loss, postnasal drip, sinus pressure, sore throat and tinnitus.   Eyes: Negative for photophobia, pain, discharge, redness, itching and visual disturbance.  Respiratory: Negative for cough, chest tightness, shortness of breath and wheezing.   Cardiovascular: Negative for chest pain, palpitations and leg swelling.  Gastrointestinal: Negative for nausea, vomiting, diarrhea, constipation and abdominal distention.  Musculoskeletal: Negative for arthralgias.  Skin: Positive for color change and rash.  Neurological: Positive for headaches. Negative for dizziness, tremors, seizures, syncope, speech difficulty, weakness, light-headedness and numbness.  Hematological: Negative for adenopathy.       Objective:   Physical Exam  Constitutional: She is oriented to person, place, and time. She appears well-developed and well-nourished.  HENT:  Head:    Right Ear: Hearing and tympanic membrane normal. No drainage, swelling or tenderness. No foreign bodies.  Left Ear: Hearing and tympanic membrane normal. No drainage, swelling or tenderness. No foreign bodies.  Mouth/Throat:    Ext ear canal bruised and erythematous bilaterally in both ears. No bleeding or swelling. Some cerumen present bilaterally.   Eyes: Conjunctivae and EOM are normal. Pupils are equal, round, and reactive to light. Right eye exhibits no discharge. Left eye exhibits no discharge.  Neck: Normal range of motion. No thyromegaly present.  Cardiovascular: Normal rate, regular rhythm, normal heart sounds and intact distal pulses.   No murmur heard. Pulmonary/Chest: Effort normal and breath sounds normal.  Abdominal: Soft.  Bowel sounds are normal.  Lymphadenopathy:    She has no cervical adenopathy.  Neurological: She is alert and oriented to person, place, and time.  Skin: Skin is warm. Lesion and rash noted. Rash is nodular. She is not diaphoretic.  Nodular growth  and lesion as described above       Assessment & Plan:     Please see problem based charting.

## 2013-09-27 NOTE — Assessment & Plan Note (Signed)
Lipid profile today. Await results.

## 2013-09-27 NOTE — Assessment & Plan Note (Signed)
Hypoglycemia happening before dinner, when the patient gives her 20 units of novolog before lunch. It has happened only on few occasions though making me suspect that the patient did not eat a diet with enough carbohydrates at that particular lunch meal. The patient although does not remember the exact meal that she had that day, does seem to generally agree with this hypothesis. Instructed the patient to keep the carbohydrate content of her meals the same every day. She has had sessions with Butch Penny about her diet regimen in the past. I am not decreasing the insulin dose today.

## 2013-09-27 NOTE — Patient Instructions (Signed)
[name],   Happy to meet you.   Please keep your diet regular, so you do not get low blood sugars  Please keep a watch on your headache, if it bothers you through this week, then please come back and get evaluated again.   We will refer you to a dermatologist.   Thanks, [name] MD MPH 09/27/2013 11:27 AM

## 2013-09-27 NOTE — Assessment & Plan Note (Addendum)
Flu shot given Eye exam referral given

## 2013-09-27 NOTE — Assessment & Plan Note (Signed)
Likely a verrucous wart like growth, mostly benign, yet I am not sure about the hypermelanotic patch underneath the growth. I will do an urgent derm referral to rule out melanoma. Discussed this with RN Lela.

## 2013-09-27 NOTE — Assessment & Plan Note (Signed)
[facility]. Next appointment on 8th October. CMP today

## 2013-10-20 ENCOUNTER — Other Ambulatory Visit: Payer: Self-pay | Admitting: Internal Medicine

## 2013-10-20 DIAGNOSIS — N259 Disorder resulting from impaired renal tubular function, unspecified: Secondary | ICD-10-CM

## 2013-11-23 ENCOUNTER — Other Ambulatory Visit: Payer: Self-pay | Admitting: Internal Medicine

## 2013-11-23 DIAGNOSIS — Z1231 Encounter for screening mammogram for malignant neoplasm of breast: Secondary | ICD-10-CM

## 2013-12-06 ENCOUNTER — Ambulatory Visit (HOSPITAL_COMMUNITY): Payer: Medicare HMO

## 2013-12-13 ENCOUNTER — Ambulatory Visit (HOSPITAL_COMMUNITY)
Admission: RE | Admit: 2013-12-13 | Discharge: 2013-12-13 | Disposition: A | Discharge status: Home / Self Care | Payer: Commercial Managed Care - HMO | Source: Ambulatory Visit | Attending: Internal Medicine | Admitting: Internal Medicine

## 2013-12-13 DIAGNOSIS — Z1231 Encounter for screening mammogram for malignant neoplasm of breast: Secondary | ICD-10-CM | POA: Insufficient documentation

## 2013-12-27 ENCOUNTER — Ambulatory Visit (INDEPENDENT_AMBULATORY_CARE_PROVIDER_SITE_OTHER): Payer: Commercial Managed Care - HMO | Admitting: Internal Medicine

## 2013-12-27 VITALS — BP 133/50 | HR 95 | Temp 98.0°F | Ht 60.0 in | Wt 140.0 lb

## 2013-12-27 DIAGNOSIS — Z0000 Encounter for general adult medical examination without abnormal findings: Secondary | ICD-10-CM

## 2013-12-27 DIAGNOSIS — E785 Hyperlipidemia, unspecified: Secondary | ICD-10-CM

## 2013-12-27 DIAGNOSIS — N183 Chronic kidney disease, stage 3 unspecified: Secondary | ICD-10-CM

## 2013-12-27 DIAGNOSIS — D492 Neoplasm of unspecified behavior of bone, soft tissue, and skin: Secondary | ICD-10-CM

## 2013-12-27 DIAGNOSIS — E11649 Type 2 diabetes mellitus with hypoglycemia without coma: Secondary | ICD-10-CM

## 2013-12-27 DIAGNOSIS — I10 Essential (primary) hypertension: Secondary | ICD-10-CM

## 2013-12-27 DIAGNOSIS — L409 Psoriasis, unspecified: Secondary | ICD-10-CM

## 2013-12-27 DIAGNOSIS — E118 Type 2 diabetes mellitus with unspecified complications: Secondary | ICD-10-CM

## 2013-12-27 DIAGNOSIS — M47819 Spondylosis without myelopathy or radiculopathy, site unspecified: Secondary | ICD-10-CM

## 2013-12-27 LAB — POCT GLYCOSYLATED HEMOGLOBIN (HGB A1C): Hemoglobin A1C: 7.4

## 2013-12-27 LAB — GLUCOSE, CAPILLARY: Glucose-Capillary: 192 mg/dL — ABNORMAL HIGH (ref 70–99)

## 2013-12-27 MED ORDER — INSULIN GLARGINE 100 UNIT/ML SOLOSTAR PEN: 44.0000 [IU] | PEN_INJECTOR | Freq: Every day | SUBCUTANEOUS | Status: DC

## 2013-12-27 MED ORDER — ROSUVASTATIN CALCIUM 10 MG PO TABS: 10.0000 mg | ORAL_TABLET | Freq: Every day | ORAL | Status: DC

## 2013-12-27 MED ORDER — FUROSEMIDE 40 MG PO TABS: 40.0000 mg | ORAL_TABLET | Freq: Every day | ORAL | Status: DC

## 2013-12-27 MED ORDER — INSULIN PEN NEEDLE 32G X 4 MM MISC
Status: DC
Start: 2013-12-27 — End: 2014-04-20

## 2013-12-27 MED ORDER — GLUCOSE BLOOD VI STRP: ORAL_STRIP | Status: DC

## 2013-12-27 MED ORDER — TRAMADOL HCL 50 MG PO TABS: ORAL_TABLET | ORAL | Status: DC

## 2013-12-27 NOTE — Assessment & Plan Note (Addendum)
Flu shot last visit Prevnar 13 today Foot exam today

## 2013-12-27 NOTE — Assessment & Plan Note (Signed)
Well controlled.  Lab Results  Component Value Date   HGBA1C 7.4 12/27/2013   HGBA1C 7.1 09/27/2013   HGBA1C 8.0 04/19/2013   Will not make any changes in medications.  Discussed balanced diet and not skipping meals with patient.

## 2013-12-27 NOTE — Assessment & Plan Note (Signed)
Well controlled. No change.  BP Readings from Last 3 Encounters:  12/27/13 133/50  09/27/13 135/57  04/19/13 140/66

## 2013-12-27 NOTE — Assessment & Plan Note (Signed)
Refilled tramadol for the patient.

## 2013-12-27 NOTE — Assessment & Plan Note (Signed)
Referall to dermatology one more time. The patient thinks she will go this time.

## 2013-12-27 NOTE — Progress Notes (Signed)
Subjective:    Patient ID: [name], female    DOB: [birth year], 73 y.o.   MRN: [id number]  HPI [name] comes for routine follow up. She has a past medical history of DM2, CKD3-4, hypertension and psoriasis.   Diabetes - She is doing well. Her glucometer log shows 1 incidence of hypoglycemia before dinner, down to 51, when she had only popcorn for lunch. She has some blood sugar readings in 200-300 range before lunch, and she takes a little extra short acting before lunch on that day.    Cough - for the last 2 months, productive of yellowish sputum. She has not been taking any cough medicine, she says she has a codeine cough syrup at home but does not take because she does not like feeling drowsy.   CKD3-4 - I have reviewed [name] last note and they think she does not need regular close follow ups, she can be seen once a year.  Hypertnesion - Well controlled. She is compliant with her medications.  Psoriasis - Uncontrolled, severe. She is currently having a red rash under her breast, which is itchy. She also has psoriasis lesions in her scalp and extremities.   Headaches - last visit's complaint. She has completely forgotten about making this complaint! She says she never has headaches, and what she mentioned last time was some twitching around her ear. I will leave it at this.  New Skin growth - last visit's complaint. She canceled [facility] appointment due to financial reasons. She would like to go there again, now that she has been eased up on her nephrology appointments.   She sees her eye doctor in January for routine check up.  She is facing a stressful social situation at this time with her grandson and his girlfriend coming to stay with her. She lives in a geriatric living facility and she fears eviction if they continue to stay with them.    She wants refill on tramadol for her back and shoulder pain.   She reports ocassional numbnes in her feet, no  cramping, more in right leg. She does not want any medication for it.   Review of Systems  Constitutional: Negative for fever, chills and diaphoresis.  HENT: Negative for nosebleeds and voice change.   Respiratory: Positive for cough. Negative for apnea, choking, chest tightness, shortness of breath, wheezing and stridor.   Cardiovascular: Negative for chest pain, palpitations and leg swelling.  Gastrointestinal: Negative for nausea and vomiting.  Musculoskeletal: Positive for back pain and arthralgias.  Neurological: Positive for numbness. Negative for dizziness, tremors, syncope and weakness.       Objective:   Physical Exam  Constitutional: She is oriented to person, place, and time. She appears well-developed and well-nourished.  HENT:  Head: Normocephalic and atraumatic.  Mouth/Throat: Oropharynx is clear and moist.  Growth verrucous on right neck behind ear - no change from before in shape and size but probably it is less melanotic today.  Eyes: Conjunctivae and EOM are normal. Pupils are equal, round, and reactive to light.  Cardiovascular: Normal rate, regular rhythm, normal heart sounds and intact distal pulses.   Pulmonary/Chest: Effort normal and breath sounds normal. No respiratory distress. She has no wheezes. She has no rales. She exhibits no tenderness.  Abdominal: Soft.  Neurological: She is alert and oriented to person, place, and time.  Foot exam: Hallux valgus deformity, bilateral. No corns, calluses, no open wounds. Normal nails. Good pulses bilaterally TP/DP.  Assessment & Plan:   Please see problem based charting.

## 2013-12-27 NOTE — Patient Instructions (Signed)
[name] to see you! You are doing so well with your blood pressure. Please keep taking your medicines.  BP Readings from Last 3 Encounters:  12/27/13 133/50  09/27/13 135/57  04/19/13 140/66   Your diabetes numbers are close to target. Great job! Lab Results  Component Value Date   HGBA1C 7.4 12/27/2013   HGBA1C 7.1 09/27/2013   HGBA1C 8.0 04/19/2013   I will continue the same doses of insulin for now.  I encourage you to take the codeine cough syrup for your cough, it is likely bronchitic. Please call the clinic if there is no benefit.  I encourage you to visit the dermatologist for the growth behind your ear and your psoriasis. I will give you another referral, and please make sure that you visit them this time.   I understand that you are undergoing a stressful social situation right now. I hope you are able to overcome it.  See you in 3 months.    Cough, Adult  A cough is a reflex. It helps you clear your throat and airways. A cough can help heal your body. A cough can last 2 or 3 weeks (acute) or may last more than 8 weeks (chronic). Some common causes of a cough can include an infection, allergy, or a cold. HOME CARE  Only take medicine as told by your doctor.  If given, take your medicines (antibiotics) as told. Finish them even if you start to feel better.  Use a cold steam vaporizer or humidifier in your home. This can help loosen thick spit (secretions).  Sleep so you are almost sitting up (semi-upright). Use pillows to do this. This helps reduce coughing.  Rest as needed.  Stop smoking if you smoke. GET HELP RIGHT AWAY IF:  You have yellowish-white fluid (pus) in your thick spit.  Your cough gets worse.  Your medicine does not reduce coughing, and you are losing sleep.  You cough up blood.  You have trouble breathing.  Your pain gets worse and medicine does not help.  You have a fever. MAKE SURE YOU:   Understand these  instructions.  Will watch your condition.  Will get help right away if you are not doing well or get worse. Document Released: 09/12/2010 Document Revised: 05/16/2013 Document Reviewed: 09/12/2010 [facility] Patient Information 2015 [place], [place]. This information is not intended to replace advice given to you by your health care provider. Make sure you discuss any questions you have with your health care provider.

## 2013-12-27 NOTE — Assessment & Plan Note (Addendum)
Already using steroid creams - uncontrolled despite. Referral to dermatology.

## 2013-12-27 NOTE — Assessment & Plan Note (Signed)
One time 51 reading. As previously observed related to inadequate intake of food/skipping meals. Educated and reinforced importance of taking 3 meals a day to the patient and trying to keep the same carb content in all of them. Patient verbalizes understanding. No changes in insulin today.

## 2013-12-27 NOTE — Assessment & Plan Note (Signed)
Lab Results  Component Value Date   CHOL 182 09/27/2013   HDL 43 09/27/2013   LDLCALC 80 09/27/2013   TRIG 297* 09/27/2013   CHOLHDL 4.2 09/27/2013    Compliant with statin. Refill done.

## 2014-01-17 ENCOUNTER — Other Ambulatory Visit: Payer: Self-pay | Admitting: Internal Medicine

## 2014-01-19 ENCOUNTER — Other Ambulatory Visit: Payer: Self-pay | Admitting: Internal Medicine

## 2014-03-24 ENCOUNTER — Other Ambulatory Visit: Payer: Self-pay | Admitting: *Deleted

## 2014-03-28 LAB — HM DIABETES EYE EXAM

## 2014-03-29 NOTE — Telephone Encounter (Signed)
Review meds 

## 2014-03-30 ENCOUNTER — Other Ambulatory Visit: Payer: Self-pay | Admitting: *Deleted

## 2014-03-30 DIAGNOSIS — K219 Gastro-esophageal reflux disease without esophagitis: Secondary | ICD-10-CM

## 2014-03-30 DIAGNOSIS — I10 Essential (primary) hypertension: Secondary | ICD-10-CM

## 2014-03-30 DIAGNOSIS — E119 Type 2 diabetes mellitus without complications: Secondary | ICD-10-CM

## 2014-03-30 DIAGNOSIS — M47819 Spondylosis without myelopathy or radiculopathy, site unspecified: Secondary | ICD-10-CM

## 2014-03-30 NOTE — Telephone Encounter (Signed)
Also needs lancets, atenolol, insulin syringe needles, potassium chloride and valsartan - to [facility]. Hilda Blades Laquon Emel RN 03/30/14 9:30AM

## 2014-03-31 MED ORDER — ESOMEPRAZOLE MAGNESIUM 20 MG PO CPDR: 20.0000 mg | DELAYED_RELEASE_CAPSULE | Freq: Every day | ORAL | Status: DC

## 2014-03-31 MED ORDER — "INSULIN SYRINGE-NEEDLE U-100 31G X 15/64"" 0.3 ML MISC": Status: DC

## 2014-03-31 MED ORDER — FUROSEMIDE 40 MG PO TABS: 40.0000 mg | ORAL_TABLET | Freq: Every day | ORAL | Status: DC

## 2014-03-31 MED ORDER — INSULIN GLARGINE 100 UNIT/ML SOLOSTAR PEN: 44.0000 [IU] | PEN_INJECTOR | Freq: Every day | SUBCUTANEOUS | Status: DC

## 2014-03-31 MED ORDER — VALSARTAN 320 MG PO TABS: 320.0000 mg | ORAL_TABLET | Freq: Every day | ORAL | Status: DC

## 2014-03-31 MED ORDER — TRAMADOL HCL 50 MG PO TABS: ORAL_TABLET | ORAL | Status: DC

## 2014-03-31 MED ORDER — ATENOLOL 25 MG PO TABS: 12.5000 mg | ORAL_TABLET | Freq: Every day | ORAL | Status: DC

## 2014-04-03 ENCOUNTER — Encounter: Payer: Self-pay | Admitting: *Deleted

## 2014-04-04 NOTE — Telephone Encounter (Signed)
Both Rx called in to pharmacy. 

## 2014-04-05 ENCOUNTER — Encounter: Payer: Self-pay | Admitting: *Deleted

## 2014-04-18 ENCOUNTER — Encounter: Payer: Medicare HMO | Admitting: Internal Medicine

## 2014-04-20 ENCOUNTER — Encounter: Payer: Self-pay | Admitting: Internal Medicine

## 2014-04-20 ENCOUNTER — Ambulatory Visit (INDEPENDENT_AMBULATORY_CARE_PROVIDER_SITE_OTHER): Payer: Commercial Managed Care - HMO | Admitting: Internal Medicine

## 2014-04-20 VITALS — BP 131/49 | HR 57 | Temp 98.7°F | Ht 60.0 in | Wt 140.0 lb

## 2014-04-20 DIAGNOSIS — E1122 Type 2 diabetes mellitus with diabetic chronic kidney disease: Secondary | ICD-10-CM

## 2014-04-20 DIAGNOSIS — E118 Type 2 diabetes mellitus with unspecified complications: Secondary | ICD-10-CM

## 2014-04-20 DIAGNOSIS — Z794 Long term (current) use of insulin: Secondary | ICD-10-CM

## 2014-04-20 DIAGNOSIS — K219 Gastro-esophageal reflux disease without esophagitis: Secondary | ICD-10-CM

## 2014-04-20 DIAGNOSIS — E11649 Type 2 diabetes mellitus with hypoglycemia without coma: Secondary | ICD-10-CM

## 2014-04-20 DIAGNOSIS — L409 Psoriasis, unspecified: Secondary | ICD-10-CM

## 2014-04-20 DIAGNOSIS — I129 Hypertensive chronic kidney disease with stage 1 through stage 4 chronic kidney disease, or unspecified chronic kidney disease: Secondary | ICD-10-CM | POA: Diagnosis not present

## 2014-04-20 DIAGNOSIS — N183 Chronic kidney disease, stage 3 unspecified: Secondary | ICD-10-CM

## 2014-04-20 DIAGNOSIS — L821 Other seborrheic keratosis: Secondary | ICD-10-CM

## 2014-04-20 DIAGNOSIS — M47819 Spondylosis without myelopathy or radiculopathy, site unspecified: Secondary | ICD-10-CM

## 2014-04-20 DIAGNOSIS — I10 Essential (primary) hypertension: Secondary | ICD-10-CM

## 2014-04-20 DIAGNOSIS — E1165 Type 2 diabetes mellitus with hyperglycemia: Secondary | ICD-10-CM

## 2014-04-20 LAB — POCT GLYCOSYLATED HEMOGLOBIN (HGB A1C): HEMOGLOBIN A1C: 6.8

## 2014-04-20 LAB — GLUCOSE, CAPILLARY: GLUCOSE-CAPILLARY: 174 mg/dL — AB (ref 70–99)

## 2014-04-20 MED ORDER — INSULIN PEN NEEDLE 32G X 4 MM MISC: Status: DC

## 2014-04-20 MED ORDER — GLUCOSE BLOOD VI STRP: ORAL_STRIP | Status: DC

## 2014-04-20 MED ORDER — TRAMADOL HCL 50 MG PO TABS: ORAL_TABLET | ORAL | Status: DC

## 2014-04-20 MED ORDER — ESOMEPRAZOLE MAGNESIUM 20 MG PO CPDR: 20.0000 mg | DELAYED_RELEASE_CAPSULE | Freq: Every day | ORAL | Status: DC

## 2014-04-20 MED ORDER — INSULIN GLARGINE 100 UNIT/ML SOLOSTAR PEN: 44.0000 [IU] | PEN_INJECTOR | Freq: Every day | SUBCUTANEOUS | Status: DC

## 2014-04-20 MED ORDER — INSULIN REGULAR HUMAN 100 UNIT/ML IJ SOLN: INTRAMUSCULAR | Status: DC

## 2014-04-20 NOTE — Patient Instructions (Signed)
Ms Gagnon  Please do not skip meals.  Please do not take Novolin/Novolong insulins (your mealtime insulins) if your blood sugars are below 80.  Since your skin doctor has started you on methotrexate, I will attach some information about the medicine for you. Let us meet in 4 months.   Thanks, [name] MD MPH 04/20/2014 2:05 PM   Methotrexate tablets What is this medicine? METHOTREXATE (METH oh TREX ate) is a chemotherapy drug. This medicine affects cells that are rapidly growing, such as cancer cells and cells in your mouth and stomach. It is used to treat many cancers and other medical conditions. It is used for leukemias, lymphomas, breast cancer, lung cancer, head and neck cancers, and other cancers. This medicine also works on the immune system and is commonly used to treat psoriasis and rheumatoid arthritis. If used for arthritis or psoriasis, the drug is only given once a week. This medicine may be used for other purposes; ask your health care provider or pharmacist if you have questions. COMMON BRAND NAME(S): Rheumatrex, Trexall What should I tell my health care provider before I take this medicine? They need to know if you have any of these conditions: -bleeding or blood disorders -HIV-positive or have acquired immunodeficiency syndrome (AIDS) -if you frequently drink alcohol-containing drinks -infection or weak immune system -kidney disease -liver disease -lung disease -stomach ulcers -ulcerative colitis -an unusual or allergic reaction to methotrexate, other medicines, foods, dyes, or preservatives -pregnant or trying to get pregnant -breast-feeding How should I use this medicine? Take this medicine by mouth. Swallow it with a full glass of water. Follow the directions on the prescription label. Do not take your medicine more often than directed. Finish the full course prescribed by your doctor or health care professional. Do not stop taking except on your doctor's  advice. If you take methotrexate for rheumatoid arthritis or psoriasis, the dose is given only once a week. Do not take more frequently. Talk to your pediatrician regarding the use of this medicine in children. Special care may be needed. Overdosage: If you think you have taken too much of this medicine contact a poison control center or emergency room at once. NOTE: This medicine is only for you. Do not share this medicine with others. What if I miss a dose? If you miss a dose, talk with your doctor or health care professional. Do not take double or extra doses. If you vomit after taking a dose, call your doctor or health care professional for advice. What may interact with this medicine? -antibiotics and other medicines for infections -aspirin and aspirin-like medicines including bismuth subsalicylate (Pepto-Bismol) -NSAIDs, medicines for pain and inflammation, like ibuprofen or naproxen -probenecid -trimetrexate -vaccines This list may not describe all possible interactions. Give your health care provider a list of all the medicines, herbs, non-prescription drugs, or dietary supplements you use. Also tell them if you smoke, drink alcohol, or use illegal drugs. Some items may interact with your medicine. What should I watch for while using this medicine? Visit your doctor or health care professional for checks on your progress. You will need to have regular blood checks. You will also need a chest X-ray before starting the medicine. If you take the medicine for rheumatoid arthritis or psoriasis, you may not see an improvement in your condition for several weeks. Do not drink alcohol-containing drinks while taking this medicine. Both alcohol and the medicine may cause damage to your liver. This medicine may increase your risk of getting  an infection. Stay away from people who are sick. To protect your kidneys, drink water or other fluids as directed while you are taking this medicine. Both men  and women must use effective birth control. Use 2 reliable forms of birth control together. Do not become pregnant while taking this medicine. Women should continue to use birth control until after their first normal menstrual cycle after stopping the medicine. Call your doctor right away if you think you or your partner might be pregnant. There is a potential for serious side effects to an unborn child. Talk to your health care professional or pharmacist for more information. Do not breast-feed an infant while taking this medicine. Men should continue to use birth control for at least 3 months after stopping the medicine. If you are going to have surgery or dental work, tell your health care professional that you are taking this medicine. This medicine can make you more sensitive to the sun. Keep out of the sun. If you cannot avoid being in the sun, wear protective clothing and use sunscreen. Do not use sun lamps or tanning beds/booths. What side effects may I notice from receiving this medicine? Side effects that you should report to your doctor or health care professional as soon as possible: -bruising, pinpoint red spots on the skin, black, tarry stools, blood in the urine -changes in vision -diarrhea -difficulty breathing or a dry cough -mouth and throat ulcers -redness, blistering, peeling or loosening of the skin, including inside the mouth -skin rash, hives, or itching -symptoms of infection like fever or chills, cough, sore throat, pain or difficulty passing urine -unusually weak or tired, fainting spells -vomiting -yellow coloring of skin or eyes Side effects that usually do not require medical attention (report to your doctor or health care professional if they continue or are bothersome): -dizziness -drowsiness -loss of appetite -nausea This list may not describe all possible side effects. Call your doctor for medical advice about side effects. You may report side effects to FDA at  1-800-FDA-1088. Where should I keep my medicine? Keep out of the reach of children. Store at room temperature between 20 and 25 degrees C (68 and 77 degrees F). Protect from light. Throw away any unused medicine after the expiration date. NOTE: This sheet is a summary. It may not cover all possible information. If you have questions about this medicine, talk to your doctor, pharmacist, or health care provider.  2015, [employer]. (2007-07-08 11:12:16)

## 2014-04-21 NOTE — Assessment & Plan Note (Signed)
Likely a combination of inconsistent caloric intake and using mealtime insulin despite having low blood sugars. This is evident from the fact that on one day according to her glucometer, her CBG was 56 before dinner, 73 fasting the next day, and 80 before lunch. She had a heavy lunch and it came up to 157 before dinner.   Instructed the patient to skip mealtime insulin if she has CBG less than 80  Instructed to try and consume more consistent calories per meal.   We had another discussion about symptoms, signs and home management of hypoglycemia.   If this does not help the problem, we will decrease the long acting dosage as well.   We will also reassess her kidney function next visit if the problem persists.

## 2014-04-21 NOTE — Assessment & Plan Note (Signed)
Lab Results  Component Value Date   HGBA1C 6.8 04/20/2014   HGBA1C 7.4 12/27/2013   HGBA1C 7.1 09/27/2013   Patient compliant with insulin but discussed hypoglycemia with her (see above).

## 2014-04-21 NOTE — Assessment & Plan Note (Signed)
Stable. Will do a BMP next visit.

## 2014-04-21 NOTE — Assessment & Plan Note (Signed)
Warts likley benign. The neck wart is darker in color, but not variegated. I asked the patient to bring this to the attention of the dermatologist next visit. The patient agrees to do so.

## 2014-04-21 NOTE — Progress Notes (Signed)
Subjective:    Patient ID: [name], female    DOB: Jun 17, 1940, 74 y.o.   MRN: [id number]  HPI [name] is a 74 year old lady with DM2 on insulin, CKD stable, stage 3-4, psoriasis and osteoarthritis who comes in for a regular follow up. The following issues were discussed this visit.  Psoriasis - She had been made a dermatology appointment for psoriasis and wart like lesions on her body by [facility]. She followed up with that. She appears to be dissatisfied with the experience. She reports that she was started on Methotrexate for psoriasis (which she has been on before) and she seems to be benefiting from it. However, she was not explained the side effects of the medicine, and she is scared to take it if it will have any dangerous sequale. She says that she has a follow up appointment in a couple weeks, and she will take methotrexate until then. She also reports that her warty lesions were not looked at even when she asked the dermatologist to have a look at them.    Diabetes Type 2 - She has been compliant with checking her blood sugars and insulin regimen. Her glucometer log reveals some hypoglycemic values lowest being 57. She reports taking inconsistent caloric value meals. On further probing she notes that she has been taking her short acting insulin even when she had before meal blood sugars less than 80. She reports feeling dizzy and slightly jittery as hypoglycemia symptoms. We have discussed about hypoglycemia in earlier visits as well.  Her cough that she had last visit has improved.   Her social situation with her grandson and his girlfriend has improved. They do not stay in her house any more. However she has to help them from time to time and she is working on improving that as well. She was slightly depressed because of that issue. However she says that her depression is much better now as her stress levels are reducing.    Review of Systems  Respiratory: Negative for cough,  choking, chest tightness and shortness of breath.   Cardiovascular: Negative for chest pain, palpitations and leg swelling.  Gastrointestinal: Negative for diarrhea and constipation.  Musculoskeletal: Positive for back pain and arthralgias. Negative for myalgias and joint swelling.  Skin: Positive for rash (psoriasis; no new rash).      Objective:   Physical Exam  Constitutional: She is oriented to person, place, and time. She appears well-developed and well-nourished.  HENT:  Head: Normocephalic and atraumatic.  Mouth/Throat: Oropharynx is clear and moist.  Eyes: Conjunctivae and EOM are normal. Pupils are equal, round, and reactive to light.  Cardiovascular: Normal rate, regular rhythm, normal heart sounds and intact distal pulses.   Pulmonary/Chest: Effort normal and breath sounds normal. No respiratory distress. She has no wheezes. She has no rales. She exhibits no tenderness.  Abdominal: Soft.  Musculoskeletal: She exhibits no edema.  Neurological: She is alert and oriented to person, place, and time.  Foot exam: Hallux valgus deformity, bilateral. No corns, calluses, no open wounds. Normal nails. Good pulses bilaterally TP/DP.  Skin: Skin is warm. Rash (psoriasis rash - better than last visit appears to be clearing up) noted.  Warty flesh colored growths - back of neck and right side of right breast. Neck growth unchanged in shape of size or color, breast growth she has shown me for the first time.       Assessment & Plan:   Please see problem based charting.

## 2014-04-21 NOTE — Assessment & Plan Note (Signed)
BP Readings from Last 3 Encounters:  04/20/14 131/49  12/27/13 133/50  09/27/13 135/57   Well controlled, continue current management

## 2014-05-23 ENCOUNTER — Ambulatory Visit (INDEPENDENT_AMBULATORY_CARE_PROVIDER_SITE_OTHER): Payer: Commercial Managed Care - HMO | Admitting: Pulmonary Disease

## 2014-05-23 ENCOUNTER — Encounter: Payer: Self-pay | Admitting: Pulmonary Disease

## 2014-05-23 ENCOUNTER — Ambulatory Visit (HOSPITAL_COMMUNITY)
Admission: RE | Admit: 2014-05-23 | Discharge: 2014-05-23 | Disposition: A | Discharge status: Home / Self Care | Payer: Commercial Managed Care - HMO | Source: Ambulatory Visit | Attending: Internal Medicine | Admitting: Internal Medicine

## 2014-05-23 VITALS — BP 137/45 | HR 61 | Temp 97.9°F | Wt 133.4 lb

## 2014-05-23 DIAGNOSIS — M25579 Pain in unspecified ankle and joints of unspecified foot: Secondary | ICD-10-CM | POA: Insufficient documentation

## 2014-05-23 DIAGNOSIS — M25572 Pain in left ankle and joints of left foot: Secondary | ICD-10-CM

## 2014-05-23 DIAGNOSIS — M7732 Calcaneal spur, left foot: Secondary | ICD-10-CM | POA: Insufficient documentation

## 2014-05-23 MED ORDER — DICLOFENAC SODIUM 1 % TD GEL: 2.0000 g | Freq: Four times a day (QID) | TRANSDERMAL | Status: DC

## 2014-05-23 NOTE — Progress Notes (Signed)
Subjective:   Patient ID: [name], female    DOB: Oct 23, 1940, 74 y.o.   MRN: [id number]  HPI [name] is a 74 year old woman with history of DM, HTN, HLD, PVD, CAD, CKD stage 4, CHF presenting for evaluation.  She reports left medial ankle pain for the past 2 weeks. It has been worsening. It is painful to bear weight on it and she has been limping. Denies trauma. She has never had this before. She reports the foot regularly goes numb at night. Denies fevers/chills, N/V, diarrhea, dysuria.  Review of Systems Constitutional: no fevers/chills Eyes: no vision changes Ears, nose, mouth, throat, and face: no cough Respiratory: no shortness of breath Cardiovascular: no chest pain Gastrointestinal: no nausea/vomiting, no abdominal pain, no constipation, no diarrhea Genitourinary: no dysuria, no hematuria Integument: no rash Hematologic/lymphatic: no bleeding/bruising, no edema Musculoskeletal: no arthralgias, no myalgias Neurological: no paresthesias, no weakness  Past Medical History  Diagnosis Date  . DM (diabetes mellitus)     insulin dependent  . Lumbar spinal stenosis   . History of colonic polyps   . Diverticulosis   . Nephrolithiasis     hx of  . HTN (hypertension)   . HLD (hyperlipidemia)   . PVD (peripheral vascular disease)     ABI indicated moderated reduction arterial flow on the left.  Marland Kitchen CAD (coronary artery disease) 2006    s/p Quadrupal CABG 2006  . MI (myocardial infarction) 2003    . [place]  . Tricuspid regurgitation 2009    moderate to severe, EF 55%  . Psoriasis   . OA (osteoarthritis)   . CKD (chronic kidney disease) stage 4, GFR 15-29 ml/min     fluctuating between stage 3 and 4 depending on GFR  . Osteopenia 2010    T score of -1.8  . CHF (congestive heart failure) 2010    EF 30-35% from Echo 06/2008  . MYOCARDIAL INFARCTION, HX OF 09/09/2006    Qualifier: Diagnosis of  By: Marinda Elk MD, Sonia Side    . DIVERTICULOSIS OF COLON  04/01/2007    Qualifier: Diagnosis of  By: Nelson-Smith CMA (AAMA), [name]    . NEPHROLITHIASIS 04/01/2007    Qualifier: Diagnosis of  By: Harlon Ditty CMA (AAMA), [name]    . Mouth ulcer adjacent to jaw bone 08/22/2010    S/p removal by ENT 09/2010    Current Outpatient Prescriptions on File Prior to Visit  Medication Sig Dispense Refill  . ACCU-CHEK FASTCLIX LANCETS MISC Check blood sugar 3 times a day 102 each 12  . atenolol (TENORMIN) 25 MG tablet Take 0.5 tablets (12.5 mg total) by mouth daily. 90 tablet 2  . betamethasone dipropionate 0.05 % lotion Apply topically 2 (two) times daily. 60 mL 0  . Blood Glucose Monitoring Suppl (ACCU-CHEK NANO SMARTVIEW) W/DEVICE KIT Check blood sugar 3 times a day 1 kit 0  . Calcium Carbonate-Vitamin D (CALCIUM-VITAMIN D) 500-200 MG-UNIT per tablet Take 1 tablet by mouth 2 (two) times daily with a meal. 180 tablet 6  . Clobetasol Prop Crea-Coal Tar 0.05 & 2.3 % KIT To apply twice daily on the affected area. 1 kit 0  . CRESTOR 10 MG tablet TAKE 1 TABLET EVERY DAY (DOSE DECREASE) 90 tablet 3  . esomeprazole (NEXIUM) 20 MG capsule Take 1 capsule (20 mg total) by mouth daily before breakfast. 90 capsule 2  . furosemide (LASIX) 40 MG tablet Take 1 tablet (40 mg total) by mouth daily. 90 tablet 4  . glucose  blood test strip Use as instructed 100 each 12  . Insulin Glargine (LANTUS SOLOSTAR) 100 UNIT/ML Solostar Pen Inject 44 Units into the skin daily at 10 pm. 45 mL 11  . Insulin Pen Needle 32G X 4 MM MISC Use with insulin pen once daily 100 each 3  . insulin regular (NOVOLIN R) 100 units/mL injection INJECT  13 UNITS SUBCUTANEOUSLY WITH BREAKFAST,LUNCH, DINNER.  Do not take if Blood sugar less than 80. 40 mL 2  . Insulin Syringe-Needle U-100 31G X 15/64" 0.3 ML MISC Use to inject mealtime insulin 3 times a day Dx code 250.00 insulin requiring 300 each 3  . Lancets (ACCU-CHEK MULTICLIX) lancets Use as instructed 100 each 12  . methotrexate (RHEUMATREX) 2.5 MG  tablet     . nitroGLYCERIN (NITROSTAT) 0.4 MG SL tablet Place 1 tablet (0.4 mg total) under the tongue every 5 (five) minutes as needed for chest pain. 60 tablet 3  . potassium chloride (K-DUR) 10 MEQ tablet Take 1 tablet (10 mEq total) by mouth 2 (two) times daily. 180 tablet 5  . traMADol (ULTRAM) 50 MG tablet 50 mg every 12 hours as needed. 60 tablet 0  . valsartan (DIOVAN) 320 MG tablet Take 1 tablet (320 mg total) by mouth daily. 90 tablet 2   No current facility-administered medications on file prior to visit.   Today's Vitals   05/23/14 0919 05/23/14 0923  BP: 137/45   Pulse: 61   Temp: 97.9 F (36.6 C)   TempSrc: Oral   Weight: 133 lb 6.4 oz (60.51 kg)   SpO2: 97%   PainSc:  10-Worst pain ever   Objective:  Physical Exam  Constitutional: She is oriented to person, place, and time. She appears well-developed and well-nourished.  HENT:  Head: Normocephalic and atraumatic.  Eyes: Conjunctivae are normal.  Neck: Neck supple.  Cardiovascular: Normal rate and regular rhythm.   Pulmonary/Chest: Effort normal.  Abdominal: Soft. She exhibits no distension.  Musculoskeletal: Normal range of motion. She exhibits tenderness (left medial ankle). She exhibits no edema.  Neurological: She is alert and oriented to person, place, and time.  Skin: Skin is warm and dry. There is erythema.  Psychiatric: She has a normal mood and affect.     Assessment & Plan:  Please refer to problem based charting.

## 2014-05-23 NOTE — Progress Notes (Signed)
INTERNAL MEDICINE TEACHING ATTENDING ADDENDUM - Nashalie Sallis, MD: I reviewed and discussed at the time of visit with the resident Dr. Krall, the patient's medical history, physical examination, diagnosis and results of pertinent tests and treatment and I agree with the patient's care as documented.  

## 2014-05-23 NOTE — Assessment & Plan Note (Addendum)
Assessment: 2 weeks of pain with overlying erythema. Tender to palpation. No signs of infection. Previously followed by vascular surgery. 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 Decreased sensation on left. Pre-ulcerative lesion vs arthropathy.  Plan: -XR L ankle -Sports medicine referral -May need to be referred back to vascular surgery if it is determined that there is no joint involvement -Voltaren gel, tramadol prn

## 2014-05-24 ENCOUNTER — Encounter: Payer: Self-pay | Admitting: Family Medicine

## 2014-05-24 ENCOUNTER — Ambulatory Visit (INDEPENDENT_AMBULATORY_CARE_PROVIDER_SITE_OTHER): Payer: Commercial Managed Care - HMO | Admitting: Family Medicine

## 2014-05-24 VITALS — BP 112/65 | HR 53 | Ht 60.0 in | Wt 133.0 lb

## 2014-05-24 DIAGNOSIS — M79672 Pain in left foot: Secondary | ICD-10-CM | POA: Diagnosis not present

## 2014-05-24 NOTE — Patient Instructions (Signed)
I do not think you're dealing with an orthopedic issue. You're hurting in the area of posterior tibialis tendinitis and tarsal tunnel syndrome but clinically you do not have these conditions. Treatment for those involves the theraband strengthening exercises and better arch support (like dr. Zoe Lan active series insoles). It wouldn't hurt to do these things though I doubt they would help. I would recommend you see the vascular surgeon for further evaluation.

## 2014-05-25 ENCOUNTER — Other Ambulatory Visit: Payer: Self-pay | Admitting: Internal Medicine

## 2014-05-30 DIAGNOSIS — M79672 Pain in left foot: Secondary | ICD-10-CM | POA: Insufficient documentation

## 2014-05-30 NOTE — Progress Notes (Signed)
PCP: [name], MD  Subjective:   HPI: Patient is a 74 y.o. female here for left foot pain.  Patient reports about 4 weeks ago she had pain in left foot medially. Associated with numbness into the foot. Pain 8/10 with walking, 0/10 with sitting. No specific motions make this worse. No known injury or trauma.  Past Medical History  Diagnosis Date  . DM (diabetes mellitus)     insulin dependent  . Lumbar spinal stenosis   . History of colonic polyps   . Diverticulosis   . Nephrolithiasis     hx of  . HTN (hypertension)   . HLD (hyperlipidemia)   . PVD (peripheral vascular disease)     ABI indicated moderated reduction arterial flow on the left.  Marland Kitchen CAD (coronary artery disease) 2006    s/p Quadrupal CABG 2006  . MI (myocardial infarction) 2003    . [place]  . Tricuspid regurgitation 2009    moderate to severe, EF 55%  . Psoriasis   . OA (osteoarthritis)   . CKD (chronic kidney disease) stage 4, GFR 15-29 ml/min     fluctuating between stage 3 and 4 depending on GFR  . Osteopenia 2010    T score of -1.8  . CHF (congestive heart failure) 2010    EF 30-35% from Echo 06/2008  . MYOCARDIAL INFARCTION, HX OF 09/09/2006    Qualifier: Diagnosis of  By: Marinda Elk MD, Sonia Side    . DIVERTICULOSIS OF COLON 04/01/2007    Qualifier: Diagnosis of  By: Nelson-Smith CMA (AAMA), [name]    . NEPHROLITHIASIS 04/01/2007    Qualifier: Diagnosis of  By: Harlon Ditty CMA (AAMA), [name]    . Mouth ulcer adjacent to jaw bone 08/22/2010    S/p removal by ENT 09/2010     Current Outpatient Prescriptions on File Prior to Visit  Medication Sig Dispense Refill  . ACCU-CHEK FASTCLIX LANCETS MISC Check blood sugar 3 times a day 102 each 12  . atenolol (TENORMIN) 25 MG tablet Take 0.5 tablets (12.5 mg total) by mouth daily. 90 tablet 2  . betamethasone dipropionate 0.05 % lotion Apply topically 2 (two) times daily. 60 mL 0  . Blood Glucose Monitoring Suppl (ACCU-CHEK NANO SMARTVIEW) W/DEVICE KIT  Check blood sugar 3 times a day 1 kit 0  . Calcium Carbonate-Vitamin D (CALCIUM-VITAMIN D) 500-200 MG-UNIT per tablet Take 1 tablet by mouth 2 (two) times daily with a meal. 180 tablet 6  . Clobetasol Prop Crea-Coal Tar 0.05 & 2.3 % KIT To apply twice daily on the affected area. 1 kit 0  . CRESTOR 10 MG tablet TAKE 1 TABLET EVERY DAY (DOSE DECREASE) 90 tablet 3  . diclofenac sodium (VOLTAREN) 1 % GEL Apply 2 g topically 4 (four) times daily. 100 g 0  . esomeprazole (NEXIUM) 20 MG capsule Take 1 capsule (20 mg total) by mouth daily before breakfast. 90 capsule 2  . furosemide (LASIX) 40 MG tablet Take 1 tablet (40 mg total) by mouth daily. 90 tablet 4  . glucose blood test strip Use as instructed 100 each 12  . Insulin Glargine (LANTUS SOLOSTAR) 100 UNIT/ML Solostar Pen Inject 44 Units into the skin daily at 10 pm. 45 mL 11  . Insulin Pen Needle 32G X 4 MM MISC Use with insulin pen once daily 100 each 3  . insulin regular (NOVOLIN R) 100 units/mL injection INJECT  13 UNITS SUBCUTANEOUSLY WITH BREAKFAST,LUNCH, DINNER.  Do not take if Blood sugar less than 80. 40 mL  2  . Insulin Syringe-Needle U-100 31G X 15/64" 0.3 ML MISC Use to inject mealtime insulin 3 times a day Dx code 250.00 insulin requiring 300 each 3  . Lancets (ACCU-CHEK MULTICLIX) lancets Use as instructed 100 each 12  . methotrexate (RHEUMATREX) 2.5 MG tablet     . nitroGLYCERIN (NITROSTAT) 0.4 MG SL tablet Place 1 tablet (0.4 mg total) under the tongue every 5 (five) minutes as needed for chest pain. 60 tablet 3  . potassium chloride (K-DUR) 10 MEQ tablet Take 1 tablet (10 mEq total) by mouth 2 (two) times daily. 180 tablet 5  . traMADol (ULTRAM) 50 MG tablet 50 mg every 12 hours as needed. 60 tablet 0  . valsartan (DIOVAN) 320 MG tablet Take 1 tablet (320 mg total) by mouth daily. 90 tablet 2   No current facility-administered medications on file prior to visit.    Past Surgical History  Procedure Laterality Date  . Laparoscopic  supracervical hysterectomy    . Coronary artery bypass graft  2006    4 vessel  . Coronary angioplasty with stent placement  2003  . Cataract extraction  2008    left eye    Allergies  Allergen Reactions  . Lisinopril Cough    History   Social History  . Marital Status: Widowed    Spouse Name: N/A  . Number of Children: N/A  . Years of Education: N/A   Occupational History  . Not on file.   Social History Main Topics  . Smoking status: Never Smoker   . Smokeless tobacco: Not on file  . Alcohol Use: No  . Drug Use: No  . Sexual Activity: Not on file   Other Topics Concern  . Not on file   Social History Narrative   Retired Event organiser   Widow/Widower   lives in [place]   4 kids (2 sons, 2 daughters) one son died many years ago in car accident.  other kids are all in [place].   Has friend that helps take care of her    Family History  Problem Relation Age of Onset  . Cirrhosis Brother     BP 112/65 mmHg  Pulse 53  Ht 5' (1.524 m)  Wt 133 lb (60.328 kg)  BMI 25.97 kg/m2  Review of Systems: See HPI above.    Objective:  Physical Exam:  Gen: NAD  Left foot/ankle: Mild discoloration medially compared to right foot.  No bruising, other deformity. FROM ankle without pain - 5/5 strength all directions. No tenderness throughout foot or ankle. Negative ant drawer and talar tilt.   Negative syndesmotic compression. Negative metatarsal and calcaneal squeezes. Thompsons test negative. Unable to detect tibial, dp pulses.    Assessment & Plan:  1. Left foot pain - exam completely benign from an orthopedic standpoint.  She has symptoms that would suggest possible posterior tibialis tendinitis, tarsal tunnel syndrome but clinically does not have these conditions - we reviewed how she would treat these with better arch support, home exercises.  Recommended she follow up with vascular surgeon for evaluation there.

## 2014-05-30 NOTE — Assessment & Plan Note (Signed)
exam completely benign from an orthopedic standpoint.  She has symptoms that would suggest possible posterior tibialis tendinitis, tarsal tunnel syndrome but clinically does not have these conditions - we reviewed how she would treat these with better arch support, home exercises.  Recommended she follow up with vascular surgeon for evaluation there.

## 2014-06-05 ENCOUNTER — Encounter: Payer: Self-pay | Admitting: *Deleted

## 2014-06-05 DIAGNOSIS — M25572 Pain in left ankle and joints of left foot: Secondary | ICD-10-CM

## 2014-06-06 NOTE — Addendum Note (Signed)
Addended by: Jacques Earthly T on: 06/06/2014 01:41 PM   Modules accepted: Orders

## 2014-06-09 ENCOUNTER — Other Ambulatory Visit: Payer: Self-pay | Admitting: Internal Medicine

## 2014-06-15 ENCOUNTER — Other Ambulatory Visit: Payer: Self-pay

## 2014-06-15 DIAGNOSIS — M25572 Pain in left ankle and joints of left foot: Secondary | ICD-10-CM

## 2014-06-20 NOTE — Telephone Encounter (Signed)
A user error has taken place: encounter opened in error, closed for administrative reasons.Regenia Skeeter, Destani Wamser Cassady6/7/20168:43 AM .

## 2014-06-26 ENCOUNTER — Encounter: Payer: Self-pay | Admitting: Vascular Surgery

## 2014-06-29 ENCOUNTER — Ambulatory Visit (INDEPENDENT_AMBULATORY_CARE_PROVIDER_SITE_OTHER): Payer: Commercial Managed Care - HMO | Admitting: Vascular Surgery

## 2014-06-29 ENCOUNTER — Encounter: Payer: Self-pay | Admitting: Vascular Surgery

## 2014-06-29 ENCOUNTER — Ambulatory Visit (HOSPITAL_COMMUNITY)
Admission: RE | Admit: 2014-06-29 | Discharge: 2014-06-29 | Disposition: A | Discharge status: Home / Self Care | Payer: Commercial Managed Care - HMO | Source: Ambulatory Visit | Attending: Vascular Surgery | Admitting: Vascular Surgery

## 2014-06-29 VITALS — BP 167/77 | HR 54 | Ht 60.0 in | Wt 137.3 lb

## 2014-06-29 DIAGNOSIS — M25572 Pain in left ankle and joints of left foot: Secondary | ICD-10-CM | POA: Insufficient documentation

## 2014-06-29 DIAGNOSIS — I70229 Atherosclerosis of native arteries of extremities with rest pain, unspecified extremity: Secondary | ICD-10-CM | POA: Diagnosis not present

## 2014-06-29 NOTE — Progress Notes (Addendum)
[facility]  Patient name: [name] MRN: [id number] DOB: 11-05-1940 Sex: female  REASON FOR CONSULT: left foot and ankle pain  HPI: [name] is a 74 y.o. female who states that for proximally a month she has had rest pain in her left foot at night. The pain is aggravated by elevation and relieved somehat with dependency. She also admits to bilateral lower extremity claudication. The symptoms are more significant on the left side. Symptoms are brought on by ambulation and relieved with rest. On the left side her symptoms involve her hip thigh and calf. She denies any history of nonhealing ulcers.  I reviewed the notes from her visit with Dr.Hudnall on 05/24/2014. At that time, she reported 4 weeks of pain in the left foot medially and associated numbness in the foot. At that time, there was noted to be some mild discoloration in the medial aspect of the left foot. It was felt that the exam was completely benign from an orthopedic standpoint. It was noted that she did not have palpable pulses and for this reason was sent for vascular consultation.  Her risk factors for peripheral vascular disease include diabetes, hypertension, hypercholesterolemia, and a family history of premature cardiovascular disease. He is not a smoker.  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  Past Medical History  Diagnosis Date  . DM (diabetes mellitus)     insulin dependent  . Lumbar spinal stenosis   . History of colonic polyps   . Diverticulosis   . Nephrolithiasis     hx of  . HTN (hypertension)   . HLD (hyperlipidemia)   . PVD (peripheral vascular disease)     ABI indicated moderated reduction arterial flow on the left.  Marland Kitchen CAD (coronary artery disease) 2006    s/p Quadrupal CABG 2006  . MI (myocardial infarction) 2003    . [place]  . Tricuspid regurgitation 2009    moderate to severe, EF 55%  . Psoriasis   . OA (osteoarthritis)     . CKD (chronic kidney disease) stage 4, GFR 15-29 ml/min     fluctuating between stage 3 and 4 depending on GFR  . Osteopenia 2010    T score of -1.8  . CHF (congestive heart failure) 2010    EF 30-35% from Echo 06/2008  . MYOCARDIAL INFARCTION, HX OF 09/09/2006    Qualifier: Diagnosis of  By: Marinda Elk MD, Sonia Side    . DIVERTICULOSIS OF COLON 04/01/2007    Qualifier: Diagnosis of  By: Nelson-Smith CMA (AAMA), [name]    . NEPHROLITHIASIS 04/01/2007    Qualifier: Diagnosis of  By: Harlon Ditty CMA (AAMA), [name]    . Mouth ulcer adjacent to jaw bone 08/22/2010    S/p removal by ENT 09/2010    Family History  Problem Relation Age of Onset  . Cirrhosis Brother    SOCIAL HISTORY: History  Substance Use Topics  . Smoking status: Never Smoker   . Smokeless tobacco: Not on file  . Alcohol Use: No   Allergies  Allergen Reactions  . Lisinopril Cough   Current Outpatient Prescriptions  Medication Sig Dispense Refill  . ACCU-CHEK FASTCLIX LANCETS MISC Check blood sugar 3 times a day 102 each 12  . atenolol (TENORMIN) 25 MG tablet Take 0.5 tablets (12.5 mg total) by mouth daily. 90 tablet 2  . betamethasone dipropionate 0.05 % lotion Apply topically 2 (two) times daily. 60 mL 0  . Blood Glucose Monitoring Suppl (  ACCU-CHEK NANO SMARTVIEW) W/DEVICE KIT Check blood sugar 3 times a day 1 kit 0  . Calcium Carbonate-Vitamin D (CALCIUM-VITAMIN D) 500-200 MG-UNIT per tablet Take 1 tablet by mouth 2 (two) times daily with a meal. 180 tablet 6  . Clobetasol Prop Crea-Coal Tar 0.05 & 2.3 % KIT To apply twice daily on the affected area. 1 kit 0  . CRESTOR 10 MG tablet TAKE 1 TABLET EVERY DAY (DOSE DECREASE) 90 tablet 3  . diclofenac sodium (VOLTAREN) 1 % GEL Apply 2 g topically 4 (four) times daily. 100 g 0  . esomeprazole (NEXIUM) 20 MG capsule Take 1 capsule (20 mg total) by mouth daily before breakfast. 90 capsule 2  . furosemide (LASIX) 40 MG tablet Take 1 tablet (40 mg total) by mouth daily. 90 tablet  4  . glucose blood test strip Use as instructed 100 each 12  . Insulin Glargine (LANTUS SOLOSTAR) 100 UNIT/ML Solostar Pen Inject 44 Units into the skin daily at 10 pm. 45 mL 11  . Insulin Pen Needle 32G X 4 MM MISC Use with insulin pen once daily 100 each 3  . insulin regular (NOVOLIN R) 100 units/mL injection INJECT  13 UNITS SUBCUTANEOUSLY WITH BREAKFAST,LUNCH, DINNER.  Do not take if Blood sugar less than 80. 40 mL 2  . Insulin Syringe-Needle U-100 31G X 15/64" 0.3 ML MISC Use to inject mealtime insulin 3 times a day Dx code 250.00 insulin requiring 300 each 3  . Lancets (ACCU-CHEK MULTICLIX) lancets Use as instructed 100 each 12  . methotrexate (RHEUMATREX) 2.5 MG tablet     . nitroGLYCERIN (NITROSTAT) 0.4 MG SL tablet Place 1 tablet (0.4 mg total) under the tongue every 5 (five) minutes as needed for chest pain. 60 tablet 3  . ONETOUCH DELICA LANCETS 57D MISC CHECK BLOOD SUGAR THREE TIMES DAILY 300 each 12  . potassium chloride (K-DUR) 10 MEQ tablet TAKE 1 TABLET TWICE DAILY 180 tablet 0  . traMADol (ULTRAM) 50 MG tablet 50 mg every 12 hours as needed. 60 tablet 0  . valsartan (DIOVAN) 320 MG tablet Take 1 tablet (320 mg total) by mouth daily. 90 tablet 2   No current facility-administered medications for this visit.   REVIEW OF SYSTEMS: Valu.Nieves ] denotes positive finding; [  ] denotes negative finding  CARDIOVASCULAR:  [ ]  chest pain   [ ]  chest pressure   [ ]  palpitations   [ ]  orthopnea   [ ]  dyspnea on exertion   Valu.Nieves ] claudication   Valu.Nieves ] rest pain   [ ]  DVT   [ ]  phlebitis PULMONARY:   [ ]  productive cough   [ ]  asthma   [ ]  wheezing NEUROLOGIC:   [ ]  weakness  Valu.Nieves ] paresthesias  [ ]  aphasia  [ ]  amaurosis  [ ]  dizziness HEMATOLOGIC:   [ ]  bleeding problems   [ ]  clotting disorders MUSCULOSKELETAL:  [ ]  joint pain   [ ]  joint swelling [ ]  leg swelling GASTROINTESTINAL: [ ]   blood in stool  [ ]   hematemesis GENITOURINARY:  [ ]   dysuria  [ ]   hematuria PSYCHIATRIC:  [ ]  history of major  depression INTEGUMENTARY:  [ ]  rashes  [ ]  ulcers CONSTITUTIONAL:  [ ]  fever   [ ]  chills  PHYSICAL EXAM: There were no vitals filed for this visit. GENERAL: The patient is a well-nourished female, in no acute distress. The vital signs are documented above. CARDIOVASCULAR: There is a regular rate and  rhythm. I do not detect carotid bruits. In the left side, which is the symptomatic side, I cannot palpate a femoral, popliteal, dorsalis pedis, or posterior tibial pulse. On the right side she has a palpable femoral and popliteal pulse with a diminished but palpable right dorsalis pedis pulse. There is no posterior tibial pulse.  There is no significant lower extremity swelling. PULMONARY: There is good air exchange bilaterally without wheezing or rales. ABDOMEN: Soft and non-tender with normal pitched bowel sounds.  MUSCULOSKELETAL: There are no major deformities or cyanosis. NEUROLOGIC: No focal weakness or paresthesias are detected. SKIN: There are no ulcers or rashes noted. There are no ischemic ulcers. PSYCHIATRIC: The patient has a normal affect.  DATA:  ARTERIAL DOPPLER STUDY: I have independently interpreted her arterial Doppler study which was done today. On the right side she has a biphasic signal in the posterior tibial artery and a triphasic dorsalis pedis signal. ABI is 75%. On the left side there is a monophasic dorsalis pedis and posterior tibial signal. ABI in the left is 25%. The pressure on the right is 116 mmHg. Toe pressure on the left is 36 mmHg.  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 An ABI of 25% suggest multilevel disease. There is no femoral pulse on the left. I recommended that we proceed with arteriography. I have reviewed with the patient the indications for arteriography. In addition, I have reviewed the potential complications of  arteriography including but not limited to: Bleeding, arterial injury, arterial thrombosis, dye action, renal insufficiency, or other unpredictable medical problems. I have explained to the patient that if we find disease amenable to angioplasty we could potentially address this at the same time. I have discussed the potential complications of angioplasty and stenting, including but not limited to: Bleeding, arterial thrombosis, arterial injury, dissection, or the need for surgical intervention. Given her history of renal insufficiency we will admit her Sunday night for gentle hydration with plans for the arteriogram on Monday, 07/03/2014. I'll make further recommendations pending these results. If she were to require subsequent surgery she would likely require preoperative cardiac evaluation given her history.  Her cardiologist is Dr. Dixie Dials.   [name] [facility] [place]: [phone]

## 2014-07-02 ENCOUNTER — Encounter (HOSPITAL_COMMUNITY): Payer: Self-pay

## 2014-07-02 ENCOUNTER — Observation Stay (HOSPITAL_COMMUNITY)
Admission: AD | Admit: 2014-07-02 | Discharge: 2014-07-04 | Disposition: A | Discharge status: Home / Self Care | Payer: Commercial Managed Care - HMO | Source: Ambulatory Visit | Attending: Vascular Surgery | Admitting: Vascular Surgery

## 2014-07-02 DIAGNOSIS — Z86010 Personal history of colonic polyps: Secondary | ICD-10-CM | POA: Insufficient documentation

## 2014-07-02 DIAGNOSIS — N184 Chronic kidney disease, stage 4 (severe): Secondary | ICD-10-CM | POA: Diagnosis not present

## 2014-07-02 DIAGNOSIS — I129 Hypertensive chronic kidney disease with stage 1 through stage 4 chronic kidney disease, or unspecified chronic kidney disease: Secondary | ICD-10-CM | POA: Insufficient documentation

## 2014-07-02 DIAGNOSIS — I509 Heart failure, unspecified: Secondary | ICD-10-CM | POA: Insufficient documentation

## 2014-07-02 DIAGNOSIS — I70223 Atherosclerosis of native arteries of extremities with rest pain, bilateral legs: Secondary | ICD-10-CM | POA: Diagnosis not present

## 2014-07-02 DIAGNOSIS — E785 Hyperlipidemia, unspecified: Secondary | ICD-10-CM | POA: Diagnosis not present

## 2014-07-02 DIAGNOSIS — Z794 Long term (current) use of insulin: Secondary | ICD-10-CM | POA: Diagnosis not present

## 2014-07-02 DIAGNOSIS — I252 Old myocardial infarction: Secondary | ICD-10-CM | POA: Diagnosis not present

## 2014-07-02 DIAGNOSIS — M4806 Spinal stenosis, lumbar region: Secondary | ICD-10-CM | POA: Insufficient documentation

## 2014-07-02 DIAGNOSIS — L409 Psoriasis, unspecified: Secondary | ICD-10-CM | POA: Diagnosis not present

## 2014-07-02 DIAGNOSIS — Z87442 Personal history of urinary calculi: Secondary | ICD-10-CM | POA: Diagnosis not present

## 2014-07-02 DIAGNOSIS — I2510 Atherosclerotic heart disease of native coronary artery without angina pectoris: Secondary | ICD-10-CM | POA: Insufficient documentation

## 2014-07-02 DIAGNOSIS — Z951 Presence of aortocoronary bypass graft: Secondary | ICD-10-CM | POA: Insufficient documentation

## 2014-07-02 DIAGNOSIS — E1122 Type 2 diabetes mellitus with diabetic chronic kidney disease: Secondary | ICD-10-CM | POA: Insufficient documentation

## 2014-07-02 DIAGNOSIS — I7092 Chronic total occlusion of artery of the extremities: Secondary | ICD-10-CM | POA: Diagnosis not present

## 2014-07-02 DIAGNOSIS — M1990 Unspecified osteoarthritis, unspecified site: Secondary | ICD-10-CM | POA: Insufficient documentation

## 2014-07-02 DIAGNOSIS — I071 Rheumatic tricuspid insufficiency: Secondary | ICD-10-CM | POA: Diagnosis not present

## 2014-07-02 DIAGNOSIS — M8580 Other specified disorders of bone density and structure, unspecified site: Secondary | ICD-10-CM | POA: Diagnosis not present

## 2014-07-02 DIAGNOSIS — I739 Peripheral vascular disease, unspecified: Secondary | ICD-10-CM | POA: Diagnosis present

## 2014-07-02 LAB — GLUCOSE, CAPILLARY
GLUCOSE-CAPILLARY: 220 mg/dL — AB (ref 65–99)
Glucose-Capillary: 209 mg/dL — ABNORMAL HIGH (ref 65–99)
Glucose-Capillary: 158 mg/dL — ABNORMAL HIGH (ref 65–99)

## 2014-07-02 LAB — COMPREHENSIVE METABOLIC PANEL
ALT: 12 U/L — AB (ref 14–54)
ANION GAP: 7 (ref 5–15)
AST: 18 U/L (ref 15–41)
Albumin: 3.6 g/dL (ref 3.5–5.0)
Alkaline Phosphatase: 54 U/L (ref 38–126)
BUN: 22 mg/dL — AB (ref 6–20)
CO2: 31 mmol/L (ref 22–32)
CREATININE: 1.41 mg/dL — AB (ref 0.44–1.00)
Calcium: 9 mg/dL (ref 8.9–10.3)
Chloride: 102 mmol/L (ref 101–111)
GFR calc Af Amer: 41 mL/min — ABNORMAL LOW (ref >60)
GFR calc non Af Amer: 35 mL/min — ABNORMAL LOW (ref >60)
GLUCOSE: 202 mg/dL — AB (ref 65–99)
Potassium: 3.9 mmol/L (ref 3.5–5.1)
Sodium: 140 mmol/L (ref 135–145)
TOTAL PROTEIN: 6.5 g/dL (ref 6.5–8.1)
Total Bilirubin: 0.5 mg/dL (ref 0.3–1.2)

## 2014-07-02 LAB — CBC
HCT: 35.2 % — ABNORMAL LOW (ref 36.0–46.0)
Hemoglobin: 11.4 g/dL — ABNORMAL LOW (ref 12.0–15.0)
MCH: 30 pg (ref 26.0–34.0)
MCHC: 32.4 g/dL (ref 30.0–36.0)
MCV: 92.6 fL (ref 78.0–100.0)
Platelets: 163 10*3/uL (ref 150–400)
RBC: 3.8 MIL/uL — AB (ref 3.87–5.11)
RDW: 13.4 % (ref 11.5–15.5)
WBC: 6 10*3/uL (ref 4.0–10.5)

## 2014-07-02 LAB — PROTIME-INR
INR: 1.05 (ref 0.00–1.49)
PROTHROMBIN TIME: 13.9 s (ref 11.6–15.2)

## 2014-07-02 MED ORDER — NITROGLYCERIN 0.4 MG SL SUBL: 0.4000 mg | SUBLINGUAL_TABLET | SUBLINGUAL | Status: DC | PRN

## 2014-07-02 MED ORDER — PANTOPRAZOLE SODIUM 40 MG PO TBEC: 40.0000 mg | DELAYED_RELEASE_TABLET | Freq: Every day | ORAL | Status: DC

## 2014-07-02 MED ORDER — POTASSIUM CHLORIDE CRYS ER 20 MEQ PO TBCR: 20.0000 meq | EXTENDED_RELEASE_TABLET | Freq: Once | ORAL | Status: DC

## 2014-07-02 MED ORDER — ROSUVASTATIN CALCIUM 10 MG PO TABS
10.0000 mg | ORAL_TABLET | Freq: Every day | ORAL | Status: DC
  Administered 2014-07-02: 10 mg via ORAL
  Filled 2014-07-02 (×2): qty 1

## 2014-07-02 MED ORDER — IRBESARTAN 300 MG PO TABS
300.0000 mg | ORAL_TABLET | Freq: Every day | ORAL | Status: DC
  Filled 2014-07-02: qty 1

## 2014-07-02 MED ORDER — ACCU-CHEK MULTICLIX LANCETS MISC: 1.0000 | Status: DC | PRN

## 2014-07-02 MED ORDER — HYDRALAZINE HCL 20 MG/ML IJ SOLN
5.0000 mg | INTRAMUSCULAR | Status: DC | PRN
  Administered 2014-07-03: 5 mg via INTRAVENOUS

## 2014-07-02 MED ORDER — ONDANSETRON HCL 4 MG/2ML IJ SOLN: 4.0000 mg | Freq: Four times a day (QID) | INTRAMUSCULAR | Status: DC | PRN

## 2014-07-02 MED ORDER — METOPROLOL TARTRATE 1 MG/ML IV SOLN: 2.0000 mg | INTRAVENOUS | Status: DC | PRN

## 2014-07-02 MED ORDER — FUROSEMIDE 40 MG PO TABS
40.0000 mg | ORAL_TABLET | Freq: Every day | ORAL | Status: DC
  Filled 2014-07-02: qty 1

## 2014-07-02 MED ORDER — INSULIN ASPART 100 UNIT/ML ~~LOC~~ SOLN: 0.0000 [IU] | Freq: Every day | SUBCUTANEOUS | Status: DC

## 2014-07-02 MED ORDER — TRAMADOL HCL 50 MG PO TABS: 50.0000 mg | ORAL_TABLET | Freq: Four times a day (QID) | ORAL | Status: DC | PRN

## 2014-07-02 MED ORDER — ALUM & MAG HYDROXIDE-SIMETH 200-200-20 MG/5ML PO SUSP: 15.0000 mL | ORAL | Status: DC | PRN

## 2014-07-02 MED ORDER — CALCIUM CARBONATE-VITAMIN D 500-200 MG-UNIT PO TABS
1.0000 | ORAL_TABLET | Freq: Two times a day (BID) | ORAL | Status: DC
  Administered 2014-07-02: 1 via ORAL
  Filled 2014-07-02 (×4): qty 1

## 2014-07-02 MED ORDER — ATENOLOL 12.5 MG HALF TABLET
12.5000 mg | ORAL_TABLET | Freq: Every day | ORAL | Status: DC
  Filled 2014-07-02: qty 1

## 2014-07-02 MED ORDER — LABETALOL HCL 5 MG/ML IV SOLN
10.0000 mg | INTRAVENOUS | Status: DC | PRN
Start: 2014-07-02 — End: 2014-07-03
  Filled 2014-07-02: qty 4

## 2014-07-02 MED ORDER — ROSUVASTATIN CALCIUM 10 MG PO TABS
10.0000 mg | ORAL_TABLET | Freq: Every day | ORAL | Status: DC
  Filled 2014-07-02: qty 1

## 2014-07-02 MED ORDER — POTASSIUM CHLORIDE ER 10 MEQ PO TBCR
10.0000 meq | EXTENDED_RELEASE_TABLET | Freq: Two times a day (BID) | ORAL | Status: DC
  Administered 2014-07-02: 10 meq via ORAL
  Filled 2014-07-02 (×3): qty 1

## 2014-07-02 MED ORDER — POTASSIUM CHLORIDE IN NACL 20-0.9 MEQ/L-% IV SOLN
INTRAVENOUS | Status: DC
  Administered 2014-07-02 – 2014-07-03 (×2): via INTRAVENOUS
  Filled 2014-07-02 (×3): qty 1000

## 2014-07-02 MED ORDER — INSULIN ASPART 100 UNIT/ML ~~LOC~~ SOLN
0.0000 [IU] | Freq: Three times a day (TID) | SUBCUTANEOUS | Status: DC
  Administered 2014-07-02 (×2): 5 [IU] via SUBCUTANEOUS
  Administered 2014-07-03 (×3): 3 [IU] via SUBCUTANEOUS

## 2014-07-02 MED ORDER — GUAIFENESIN-DM 100-10 MG/5ML PO SYRP: 15.0000 mL | ORAL_SOLUTION | ORAL | Status: DC | PRN

## 2014-07-02 MED ORDER — PHENOL 1.4 % MT LIQD: 1.0000 | OROMUCOSAL | Status: DC | PRN

## 2014-07-02 MED ORDER — ENOXAPARIN SODIUM 40 MG/0.4ML ~~LOC~~ SOLN
40.0000 mg | SUBCUTANEOUS | Status: DC
  Administered 2014-07-02: 40 mg via SUBCUTANEOUS
  Filled 2014-07-02 (×2): qty 0.4

## 2014-07-02 MED ORDER — OXYCODONE-ACETAMINOPHEN 5-325 MG PO TABS: 1.0000 | ORAL_TABLET | ORAL | Status: DC | PRN

## 2014-07-02 NOTE — Progress Notes (Signed)
Pt arrived to unit up in room ambulating, v/s obtained, Dr. Scot Dock notified, pt a&o x 3 with friend at bedside. Will continue to monitor. Cato Mulligan RN

## 2014-07-03 ENCOUNTER — Encounter (HOSPITAL_COMMUNITY)
Admission: AD | Disposition: A | Discharge status: Home / Self Care | Payer: Commercial Managed Care - HMO | Source: Ambulatory Visit | Attending: Vascular Surgery

## 2014-07-03 ENCOUNTER — Ambulatory Visit (HOSPITAL_COMMUNITY)
Admission: RE | Admit: 2014-07-03 | Payer: Commercial Managed Care - HMO | Source: Ambulatory Visit | Admitting: Vascular Surgery

## 2014-07-03 ENCOUNTER — Encounter (HOSPITAL_COMMUNITY): Payer: Self-pay | Admitting: Vascular Surgery

## 2014-07-03 ENCOUNTER — Other Ambulatory Visit: Payer: Self-pay | Admitting: *Deleted

## 2014-07-03 DIAGNOSIS — I70223 Atherosclerosis of native arteries of extremities with rest pain, bilateral legs: Secondary | ICD-10-CM | POA: Diagnosis not present

## 2014-07-03 DIAGNOSIS — Z9862 Peripheral vascular angioplasty status: Secondary | ICD-10-CM

## 2014-07-03 DIAGNOSIS — I70222 Atherosclerosis of native arteries of extremities with rest pain, left leg: Secondary | ICD-10-CM | POA: Diagnosis not present

## 2014-07-03 DIAGNOSIS — I739 Peripheral vascular disease, unspecified: Secondary | ICD-10-CM

## 2014-07-03 HISTORY — PX: PERIPHERAL VASCULAR CATHETERIZATION: SHX172C

## 2014-07-03 LAB — GLUCOSE, CAPILLARY
GLUCOSE-CAPILLARY: 174 mg/dL — AB (ref 65–99)
GLUCOSE-CAPILLARY: 131 mg/dL — AB (ref 65–99)
GLUCOSE-CAPILLARY: 198 mg/dL — AB (ref 65–99)
Glucose-Capillary: 169 mg/dL — ABNORMAL HIGH (ref 65–99)
Glucose-Capillary: 193 mg/dL — ABNORMAL HIGH (ref 65–99)

## 2014-07-03 LAB — CREATININE, SERUM
Creatinine, Ser: 1.35 mg/dL — ABNORMAL HIGH (ref 0.44–1.00)
GFR calc Af Amer: 43 mL/min — ABNORMAL LOW (ref >60)
GFR calc non Af Amer: 37 mL/min — ABNORMAL LOW (ref >60)

## 2014-07-03 LAB — POCT ACTIVATED CLOTTING TIME: ACTIVATED CLOTTING TIME: 177 s

## 2014-07-03 SURGERY — ABDOMINAL AORTOGRAM W/LOWER EXTREMITY
Anesthesia: LOCAL

## 2014-07-03 MED ORDER — ACETAMINOPHEN 325 MG PO TABS: 650.0000 mg | ORAL_TABLET | ORAL | Status: DC | PRN

## 2014-07-03 MED ORDER — SODIUM CHLORIDE 0.9 % IV SOLN
INTRAVENOUS | Status: DC
  Administered 2014-07-04: 02:00:00 via INTRAVENOUS

## 2014-07-03 MED ORDER — HEPARIN SODIUM (PORCINE) 1000 UNIT/ML IJ SOLN
INTRAMUSCULAR | Status: AC
  Filled 2014-07-03: qty 1

## 2014-07-03 MED ORDER — ONDANSETRON HCL 4 MG/2ML IJ SOLN: 4.0000 mg | Freq: Four times a day (QID) | INTRAMUSCULAR | Status: DC | PRN

## 2014-07-03 MED ORDER — ASPIRIN EC 81 MG PO TBEC
81.0000 mg | DELAYED_RELEASE_TABLET | Freq: Every day | ORAL | Status: DC
  Administered 2014-07-03 – 2014-07-04 (×2): 81 mg via ORAL
  Filled 2014-07-03 (×3): qty 1

## 2014-07-03 MED ORDER — ENOXAPARIN SODIUM 40 MG/0.4ML ~~LOC~~ SOLN
40.0000 mg | SUBCUTANEOUS | Status: DC
  Administered 2014-07-04: 40 mg via SUBCUTANEOUS
  Filled 2014-07-03 (×2): qty 0.4

## 2014-07-03 MED ORDER — OXYCODONE-ACETAMINOPHEN 5-325 MG PO TABS
1.0000 | ORAL_TABLET | ORAL | Status: DC | PRN
  Administered 2014-07-03: 2 via ORAL
  Filled 2014-07-03: qty 2

## 2014-07-03 MED ORDER — INSULIN GLARGINE 100 UNIT/ML ~~LOC~~ SOLN
44.0000 [IU] | Freq: Every day | SUBCUTANEOUS | Status: DC
  Administered 2014-07-03: 44 [IU] via SUBCUTANEOUS
  Filled 2014-07-03 (×2): qty 0.44

## 2014-07-03 MED ORDER — MIDAZOLAM HCL 2 MG/2ML IJ SOLN
INTRAMUSCULAR | Status: AC
  Filled 2014-07-03: qty 2

## 2014-07-03 MED ORDER — MIDAZOLAM HCL 2 MG/2ML IJ SOLN
INTRAMUSCULAR | Status: DC | PRN
  Administered 2014-07-03: 1 mg via INTRAVENOUS

## 2014-07-03 MED ORDER — FENTANYL CITRATE (PF) 100 MCG/2ML IJ SOLN
INTRAMUSCULAR | Status: AC
  Filled 2014-07-03: qty 2

## 2014-07-03 MED ORDER — HEPARIN SODIUM (PORCINE) 1000 UNIT/ML IJ SOLN
INTRAMUSCULAR | Status: DC | PRN
  Administered 2014-07-03: 1000 [IU] via INTRAVENOUS
  Administered 2014-07-03: 5000 [IU] via INTRAVENOUS

## 2014-07-03 MED ORDER — HEPARIN (PORCINE) IN NACL 2-0.9 UNIT/ML-% IJ SOLN
INTRAMUSCULAR | Status: AC
  Filled 2014-07-03: qty 1000

## 2014-07-03 MED ORDER — CLOPIDOGREL BISULFATE 75 MG PO TABS
75.0000 mg | ORAL_TABLET | Freq: Every day | ORAL | Status: DC
  Administered 2014-07-04: 75 mg via ORAL
  Filled 2014-07-03 (×2): qty 1

## 2014-07-03 MED ORDER — LIDOCAINE HCL (PF) 1 % IJ SOLN
INTRAMUSCULAR | Status: AC
  Filled 2014-07-03: qty 30

## 2014-07-03 MED ORDER — FENTANYL CITRATE (PF) 100 MCG/2ML IJ SOLN
INTRAMUSCULAR | Status: DC | PRN
  Administered 2014-07-03: 50 ug via INTRAVENOUS

## 2014-07-03 MED ORDER — IODIXANOL 320 MG/ML IV SOLN
INTRAVENOUS | Status: DC | PRN
  Administered 2014-07-03: 150 mL via INTRA_ARTERIAL

## 2014-07-03 MED ORDER — CLOPIDOGREL BISULFATE 75 MG PO TABS
150.0000 mg | ORAL_TABLET | Freq: Once | ORAL | Status: AC
  Administered 2014-07-03: 150 mg via ORAL
  Filled 2014-07-03 (×2): qty 2

## 2014-07-03 SURGICAL SUPPLY — 16 items
CATH ANGIO 5F PIGTAIL 65CM (CATHETERS) ×2
CATH STRAIGHT 5FR 65CM (CATHETERS) ×2
COVER PRB 48X5XTLSCP FOLD TPE (BAG) ×2
COVER PROBE 5X48 (BAG) ×4
GUIDEWIRE ANGLED .035X150CM (WIRE) ×2
KIT ENCORE 26 ADVANTAGE (KITS) ×2
KIT MICROINTRODUCER STIFF 5F (SHEATH) ×4
KIT PV (KITS) ×2
SHEATH BRITE TIP 6FR 35CM (SHEATH) ×2
SHEATH PINNACLE 5F 10CM (SHEATH) ×4
STENT OMNILINK ELITE 6X29X80 (Permanent Stent) ×2 IMPLANT
SYR MEDRAD MARK V 150ML (SYRINGE)
TRANSDUCER W/STOPCOCK (MISCELLANEOUS) ×2
TRAY PV CATH (CUSTOM PROCEDURE TRAY) ×2
WIRE HITORQ VERSACORE ST 145CM (WIRE) ×2
WIRE TORQFLEX AUST .018X40CM (WIRE) ×6

## 2014-07-03 NOTE — Progress Notes (Signed)
Site area: RFA LFA Site Prior to Removal:  Level 0/0 Pressure Applied For:35min each side Manual:  yes  Patient Status During Pull:  stable Post Pull Site:  Level 0/0 Post Pull Instructions Given:  yes Post Pull Pulses Present: palpable Dressing Applied:  Clear/clear Bedrest begins @ [id number] Comments:

## 2014-07-03 NOTE — H&P (View-Only) (Signed)
Vascular and Vein Specialist of [facility]  Patient name: [name] MRN: [id number] DOB: 10-04-1940 Sex: female  REASON FOR CONSULT: left foot and ankle pain  HPI: [name] is a 74 y.o. female who states that for proximally a month she has had rest pain in her left foot at night. The pain is aggravated by elevation and relieved somehat with dependency. She also admits to bilateral lower extremity claudication. The symptoms are more significant on the left side. Symptoms are brought on by ambulation and relieved with rest. On the left side her symptoms involve her hip thigh and calf. She denies any history of nonhealing ulcers.  I reviewed the notes from her visit with Dr.Hudnall on 05/24/2014. At that time, she reported 4 weeks of pain in the left foot medially and associated numbness in the foot. At that time, there was noted to be some mild discoloration in the medial aspect of the left foot. It was felt that the exam was completely benign from an orthopedic standpoint. It was noted that she did not have palpable pulses and for this reason was sent for vascular consultation.  Her risk factors for peripheral vascular disease include diabetes, hypertension, hypercholesterolemia, and a family history of premature cardiovascular disease. He is not a smoker.  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  Past Medical History  Diagnosis Date  . DM (diabetes mellitus)     insulin dependent  . Lumbar spinal stenosis   . History of colonic polyps   . Diverticulosis   . Nephrolithiasis     hx of  . HTN (hypertension)   . HLD (hyperlipidemia)   . PVD (peripheral vascular disease)     ABI indicated moderated reduction arterial flow on the left.  Marland Kitchen CAD (coronary artery disease) 2006    s/p Quadrupal CABG 2006  . MI (myocardial infarction) 2003    . [place]  . Tricuspid regurgitation 2009    moderate to severe, EF 55%  . Psoriasis   . OA (osteoarthritis)     . CKD (chronic kidney disease) stage 4, GFR 15-29 ml/min     fluctuating between stage 3 and 4 depending on GFR  . Osteopenia 2010    T score of -1.8  . CHF (congestive heart failure) 2010    EF 30-35% from Echo 06/2008  . MYOCARDIAL INFARCTION, HX OF 09/09/2006    Qualifier: Diagnosis of  By: Marinda Elk MD, Sonia Side    . DIVERTICULOSIS OF COLON 04/01/2007    Qualifier: Diagnosis of  By: Nelson-Smith CMA (AAMA), [name]    . NEPHROLITHIASIS 04/01/2007    Qualifier: Diagnosis of  By: Harlon Ditty CMA (AAMA), [name]    . Mouth ulcer adjacent to jaw bone 08/22/2010    S/p removal by ENT 09/2010    Family History  Problem Relation Age of Onset  . Cirrhosis Brother    SOCIAL HISTORY: History  Substance Use Topics  . Smoking status: Never Smoker   . Smokeless tobacco: Not on file  . Alcohol Use: No   Allergies  Allergen Reactions  . Lisinopril Cough   Current Outpatient Prescriptions  Medication Sig Dispense Refill  . ACCU-CHEK FASTCLIX LANCETS MISC Check blood sugar 3 times a day 102 each 12  . atenolol (TENORMIN) 25 MG tablet Take 0.5 tablets (12.5 mg total) by mouth daily. 90 tablet 2  . betamethasone dipropionate 0.05 % lotion Apply topically 2 (two) times daily. 60 mL 0  . Blood Glucose Monitoring Suppl (  ACCU-CHEK NANO SMARTVIEW) W/DEVICE KIT Check blood sugar 3 times a day 1 kit 0  . Calcium Carbonate-Vitamin D (CALCIUM-VITAMIN D) 500-200 MG-UNIT per tablet Take 1 tablet by mouth 2 (two) times daily with a meal. 180 tablet 6  . Clobetasol Prop Crea-Coal Tar 0.05 & 2.3 % KIT To apply twice daily on the affected area. 1 kit 0  . CRESTOR 10 MG tablet TAKE 1 TABLET EVERY DAY (DOSE DECREASE) 90 tablet 3  . diclofenac sodium (VOLTAREN) 1 % GEL Apply 2 g topically 4 (four) times daily. 100 g 0  . esomeprazole (NEXIUM) 20 MG capsule Take 1 capsule (20 mg total) by mouth daily before breakfast. 90 capsule 2  . furosemide (LASIX) 40 MG tablet Take 1 tablet (40 mg total) by mouth daily. 90 tablet  4  . glucose blood test strip Use as instructed 100 each 12  . Insulin Glargine (LANTUS SOLOSTAR) 100 UNIT/ML Solostar Pen Inject 44 Units into the skin daily at 10 pm. 45 mL 11  . Insulin Pen Needle 32G X 4 MM MISC Use with insulin pen once daily 100 each 3  . insulin regular (NOVOLIN R) 100 units/mL injection INJECT  13 UNITS SUBCUTANEOUSLY WITH BREAKFAST,LUNCH, DINNER.  Do not take if Blood sugar less than 80. 40 mL 2  . Insulin Syringe-Needle U-100 31G X 15/64" 0.3 ML MISC Use to inject mealtime insulin 3 times a day Dx code 250.00 insulin requiring 300 each 3  . Lancets (ACCU-CHEK MULTICLIX) lancets Use as instructed 100 each 12  . methotrexate (RHEUMATREX) 2.5 MG tablet     . nitroGLYCERIN (NITROSTAT) 0.4 MG SL tablet Place 1 tablet (0.4 mg total) under the tongue every 5 (five) minutes as needed for chest pain. 60 tablet 3  . ONETOUCH DELICA LANCETS 16X MISC CHECK BLOOD SUGAR THREE TIMES DAILY 300 each 12  . potassium chloride (K-DUR) 10 MEQ tablet TAKE 1 TABLET TWICE DAILY 180 tablet 0  . traMADol (ULTRAM) 50 MG tablet 50 mg every 12 hours as needed. 60 tablet 0  . valsartan (DIOVAN) 320 MG tablet Take 1 tablet (320 mg total) by mouth daily. 90 tablet 2   No current facility-administered medications for this visit.   REVIEW OF SYSTEMS: Valu.Nieves ] denotes positive finding; [  ] denotes negative finding  CARDIOVASCULAR:  [ ]  chest pain   [ ]  chest pressure   [ ]  palpitations   [ ]  orthopnea   [ ]  dyspnea on exertion   Valu.Nieves ] claudication   Valu.Nieves ] rest pain   [ ]  DVT   [ ]  phlebitis PULMONARY:   [ ]  productive cough   [ ]  asthma   [ ]  wheezing NEUROLOGIC:   [ ]  weakness  Valu.Nieves ] paresthesias  [ ]  aphasia  [ ]  amaurosis  [ ]  dizziness HEMATOLOGIC:   [ ]  bleeding problems   [ ]  clotting disorders MUSCULOSKELETAL:  [ ]  joint pain   [ ]  joint swelling [ ]  leg swelling GASTROINTESTINAL: [ ]   blood in stool  [ ]   hematemesis GENITOURINARY:  [ ]   dysuria  [ ]   hematuria PSYCHIATRIC:  [ ]  history of major  depression INTEGUMENTARY:  [ ]  rashes  [ ]  ulcers CONSTITUTIONAL:  [ ]  fever   [ ]  chills  PHYSICAL EXAM: There were no vitals filed for this visit. GENERAL: The patient is a well-nourished female, in no acute distress. The vital signs are documented above. CARDIOVASCULAR: There is a regular rate and  rhythm. I do not detect carotid bruits. In the left side, which is the symptomatic side, I cannot palpate a femoral, popliteal, dorsalis pedis, or posterior tibial pulse. On the right side she has a palpable femoral and popliteal pulse with a diminished but palpable right dorsalis pedis pulse. There is no posterior tibial pulse.  There is no significant lower extremity swelling. PULMONARY: There is good air exchange bilaterally without wheezing or rales. ABDOMEN: Soft and non-tender with normal pitched bowel sounds.  MUSCULOSKELETAL: There are no major deformities or cyanosis. NEUROLOGIC: No focal weakness or paresthesias are detected. SKIN: There are no ulcers or rashes noted. There are no ischemic ulcers. PSYCHIATRIC: The patient has a normal affect.  DATA:  ARTERIAL DOPPLER STUDY: I have independently interpreted her arterial Doppler study which was done today. On the right side she has a biphasic signal in the posterior tibial artery and a triphasic dorsalis pedis signal. ABI is 75%. On the left side there is a monophasic dorsalis pedis and posterior tibial signal. ABI in the left is 25%. The pressure on the right is 116 mmHg. Toe pressure on the left is 36 mmHg.  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 An ABI of 25% suggest multilevel disease. There is no femoral pulse on the left. I recommended that we proceed with arteriography. I have reviewed with the patient the indications for arteriography. In addition, I have reviewed the potential complications of  arteriography including but not limited to: Bleeding, arterial injury, arterial thrombosis, dye action, renal insufficiency, or other unpredictable medical problems. I have explained to the patient that if we find disease amenable to angioplasty we could potentially address this at the same time. I have discussed the potential complications of angioplasty and stenting, including but not limited to: Bleeding, arterial thrombosis, arterial injury, dissection, or the need for surgical intervention. Given her history of renal insufficiency we will admit her Sunday night for gentle hydration with plans for the arteriogram on Monday, 07/03/2014. I'll make further recommendations pending these results. If she were to require subsequent surgery she would likely require preoperative cardiac evaluation given her history.  Her cardiologist is Dr. Dixie Dials.   [name] [facility] [place]: [phone]

## 2014-07-03 NOTE — Interval H&P Note (Signed)
History and Physical Interval Note:  07/03/2014 7:09 AM  [name]  has presented today for surgery, with the diagnosis of PVD with left foor rest pain  The various methods of treatment have been discussed with the patient and family. After consideration of risks, benefits and other options for treatment, the patient has consented to  Procedure(s): Abdominal Aortogram w/Lower Extremity (N/A) as a surgical intervention .  The patient's history has been reviewed, patient examined, no change in status, stable for surgery.  I have reviewed the patient's chart and labs.  Questions were answered to the patient's satisfaction.     [name]

## 2014-07-03 NOTE — Progress Notes (Signed)
Inpatient Diabetes Program Recommendations  AACE/ADA: New Consensus Statement on Inpatient Glycemic Control (2013)  Target Ranges:  Prepandial:   less than 140 mg/dL      Peak postprandial:   less than 180 mg/dL (1-2 hours)      Critically ill patients:  140 - 180 mg/dL    Results for [name], [name] (MRN [id number]) as of 07/03/2014 10:33  Ref. Range 07/02/2014 11:27 07/02/2014 16:02 07/02/2014 21:43  Glucose-Capillary Latest Ref Range: 65-99 mg/dL 209 (H) 220 (H) 158 (H)     Admit with: PVD  History: DM, HTN, CKD4  Home DM Meds: Lantus 44 units QHS       Regular 13 units tidwc  Current DM Orders: Lantus 44 units QHS            Novolog Moderate SSI (0-15 units) tid ac + HS     MD- Please consider adding 50% of patient's home dose of meal time insulin coverage-  Novolog 6 units tid with meals    Will follow Wyn Quaker RN, MSN, CDE Diabetes Coordinator Inpatient Glycemic Control Team Team Pager: [phone] (8a-5p)

## 2014-07-04 ENCOUNTER — Telehealth: Payer: Self-pay | Admitting: Vascular Surgery

## 2014-07-04 DIAGNOSIS — I70223 Atherosclerosis of native arteries of extremities with rest pain, bilateral legs: Secondary | ICD-10-CM | POA: Diagnosis not present

## 2014-07-04 LAB — BASIC METABOLIC PANEL
Anion gap: 7 (ref 5–15)
BUN: 18 mg/dL (ref 6–20)
CO2: 31 mmol/L (ref 22–32)
CREATININE: 1.19 mg/dL — AB (ref 0.44–1.00)
Calcium: 8.7 mg/dL — ABNORMAL LOW (ref 8.9–10.3)
Chloride: 103 mmol/L (ref 101–111)
GFR calc Af Amer: 50 mL/min — ABNORMAL LOW (ref >60)
GFR, EST NON AFRICAN AMERICAN: 43 mL/min — AB (ref >60)
Glucose, Bld: 142 mg/dL — ABNORMAL HIGH (ref 65–99)
Potassium: 4.3 mmol/L (ref 3.5–5.1)
Sodium: 141 mmol/L (ref 135–145)

## 2014-07-04 LAB — POCT ACTIVATED CLOTTING TIME
ACTIVATED CLOTTING TIME: 196 s
Activated Clotting Time: 190 seconds

## 2014-07-04 LAB — GLUCOSE, CAPILLARY
GLUCOSE-CAPILLARY: 185 mg/dL — AB (ref 65–99)
Glucose-Capillary: 114 mg/dL — ABNORMAL HIGH (ref 65–99)

## 2014-07-04 MED ORDER — CLOPIDOGREL BISULFATE 75 MG PO TABS: 75.0000 mg | ORAL_TABLET | Freq: Every day | ORAL | Status: DC

## 2014-07-04 MED FILL — Lidocaine HCl Local Preservative Free (PF) Inj 1%: INTRAMUSCULAR | Qty: 30 | Status: AC

## 2014-07-04 MED FILL — Heparin Sodium (Porcine) 2 Unit/ML in Sodium Chloride 0.9%: INTRAMUSCULAR | Qty: 1000 | Status: AC

## 2014-07-04 NOTE — Telephone Encounter (Signed)
Spoke with pt, dpm °

## 2014-07-04 NOTE — Progress Notes (Signed)
Removed IV per protocol. Reviewed D/C instructions with patient. Patient awaiting ride waiting in the room. Will continue to monitor.  Kaneesha Constantino, Mervin Kung RN

## 2014-07-04 NOTE — Progress Notes (Signed)
   VASCULAR SURGERY ASSESSMENT & PLAN:  * 1 Day Post-Op s/p: Left common iliac artery PTA/ stent for CTO  *  Home today on Plavix.  SUBJECTIVE: No complaints  PHYSICAL EXAM: Filed Vitals:   07/03/14 1345 07/03/14 1405 07/03/14 2206 07/04/14 0355  BP: 147/50 142/55 122/43 135/67  Pulse:   58 56  Temp:   97.6 F (36.4 C) 98.1 F (36.7 C)  TempSrc:   Oral Oral  Resp:   18 16  Height:      Weight:      SpO2:   94% 98%   Feet warm.  No groin hematomas  LABS: Lab Results  Component Value Date   WBC 6.0 07/02/2014   HGB 11.4* 07/02/2014   HCT 35.2* 07/02/2014   MCV 92.6 07/02/2014   PLT 163 07/02/2014   Lab Results  Component Value Date   CREATININE 1.19* 07/04/2014   Lab Results  Component Value Date   INR 1.05 07/02/2014   CBG (last 3)   Recent Labs  07/03/14 1624 07/03/14 2151 07/04/14 0637  GLUCAP 193* 198* 114*    Active Problems:   Peripheral vascular disease   PVD (peripheral vascular disease)    Gae Gallop Beeper: [id number] 07/04/2014

## 2014-07-04 NOTE — Care Management Note (Signed)
Case Management Note  Patient Details  Name: CRESCENTIA KNIESS MRN: [id number] Date of Birth: March 06, 1940  Subjective/Objective:   Pt admitted with PVD                 Action/Plan:  CM will continue to monitor for disposition plan   Expected Discharge Date:                  Expected Discharge Plan:  Home/Self Care  In-House Referral:     Discharge planning Services  CM Consult  Post Acute Care Choice:    Choice offered to:     DME Arranged:    DME Agency:     HH Arranged:    [place] Agency:     Status of Service:  Completed, signed off  Medicare Important Message Given:  No Date Medicare IM Given:    Medicare IM give by:    Date Additional Medicare IM Given:    Additional Medicare Important Message give by:     If discussed at [place] of Stay Meetings, dates discussed:    Additional Comments: CM assessed pt; pt is from home independent.  Pt already has walker, cane and 3:1 at home.  NO CM needs Maryclare Labrador, RN 07/04/2014, 11:20 AM

## 2014-07-04 NOTE — Telephone Encounter (Signed)
-----   Message from Mena Goes, RN sent at 07/03/2014  9:56 AM EDT ----- Regarding: Schedule   ----- Message -----    From: Angelia Mould, MD    Sent: 07/03/2014   9:23 AM      To: Vvs Charge Pool Subject: charge                                         PROCEDURE:  1. Ultrasound-guided access to the right common femoral artery 2. Aortogram with bilateral iliac arteriogram and bilateral lower extremity runoff 3. Sound guided access to the left common femoral artery 4. PTA and stent of the left common iliac artery for a chronic total occlusion  SURGEON: Judeth Cornfield. Scot Dock, MD, FACS  She will need a follow up visit in 1 month with ABIs at that time. Thank you. CD

## 2014-07-04 NOTE — Progress Notes (Signed)
  Pharmacy Discharge Medication Therapy Review   Total Number of meds on admission ____15_____ (polypharmacy > 10 meds)  Indications for all medications: [x]  Yes       []  No  Adherence Review  []  Excellent (no doses missed/week)     []  Good (no more than 1 dose missed/week)     []  Partial (2-3 doses missed/week)     []  Poor (>3 doses missed/week)     [x]  Not assessed  Total number of high risk medications _3__ (Anticoagulants, Dual antiplatelets, oral Antihyperglycemic agents,Insulins, Antipsychotics, Anti-Seizure meds, Inhalers, HF/ACS meds, Antibiotics and HIV medications)   Assessment: (Medication related problems)  Intervention  YES NO  Explanation   Indications      Medication without noted indication []  [x]     Indication without noted medication []  [x]     Duplicate therapy []  [x]    Efficacy      Suboptimal drug or dose selection [x]  []  69 YOF w/ PMH of HFrEF and MI.  Patient is on atenolol 25 mg by mouth daily.  Please consider switching patient to metoprolol or carvedilol based on ACC/AHA Heart Failure guidelines.  Of note, patient previously on metoprolol 25 mg XL but unclear why this was switched.    Insufficient dose/duration []  [x]    Failure to receive therapy  (Rx not filled) []  [x]     Safety      Adverse drug event []  [x]     Drug interaction []  [x]     Excessive dose/duration []  [x]    High-risk medications []  [x]    Compliance     Underuse []  [x]     Overuse []  [x]    Other pertinent pharmacist counseling []  [x]      Total number of new medications upon discharge: ____1_____  Time:  Time spent preparing for discharge counseling:  Time spent counseling patient: 0 min. Additional time spent on discharge (specify): 10 minute medication review.   PLAN:  Consider the following at discharge/Recommendations  - Change atenolol to carvedilol 3.125 or metoprolol 12.5 mg by mouth twice daily  Hassie Bruce, Pharm. D. Clinical Pharmacy Resident Pager: [phone] Ph:  [phone] 07/04/2014 8:38 AM

## 2014-07-05 ENCOUNTER — Encounter: Payer: Self-pay | Admitting: Vascular Surgery

## 2014-07-06 NOTE — Discharge Summary (Signed)
[facility]ascular [facility] Vein Specialists Discharge Summary  [name] [birth year] 74 y.o. female  [id number]  Admission Date: 07/02/2014  Discharge Date: 07/04/2014  Physician: [name], MD  Admission Diagnosis: PVD Left Leg Pain PVD with left foor rest pain  HPI:   This is a 74 y.o. female who states that for approximately a month she has had rest pain in her left foot at night. The pain is aggravated by elevation and relieved somehat with dependency. She also admits to bilateral lower extremity claudication. The symptoms are more significant on the left side. Symptoms are brought on by ambulation and relieved with rest. On the left side her symptoms involve her hip thigh and calf. She denies any history of nonhealing ulcers.  I reviewed the notes from her visit with Dr.Hudnall on 05/24/2014. At that time, she reported 4 weeks of pain in the left foot medially and associated numbness in the foot. At that time, there was noted to be some mild discoloration in the medial aspect of the left foot. It was felt that the exam was completely benign from an orthopedic standpoint. It was noted that she did not have palpable pulses and for this reason was sent for vascular consultation.  Her risk factors for peripheral vascular disease include diabetes, hypertension, hypercholesterolemia, and a family history of premature cardiovascular disease. He is not a smoker.  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  Hospital Course:  The patient was admitted to the hospital and taken to the operating room on 07/02/2014 for IV hydration. On 07/03/2014 she underwent: left common iliac artery percutaneous transluminal angioplasty and stent placement.   The patient tolerated the procedure well and was transported to the recovery in good condition.   On POD 1, she was doing well. Her groin was without hematoma and feet were warm. Her creatinine was stable. She was discharged home on  plavix.     CBC    Component Value Date/Time   WBC 6.0 07/02/2014 1300   RBC 3.80* 07/02/2014 1300   HGB 11.4* 07/02/2014 1300   HCT 35.2* 07/02/2014 1300   PLT 163 07/02/2014 1300   MCV 92.6 07/02/2014 1300   MCH 30.0 07/02/2014 1300   MCHC 32.4 07/02/2014 1300   RDW 13.4 07/02/2014 1300   LYMPHSABS 1.6 04/22/2010 1820   MONOABS 0.6 04/22/2010 1820   EOSABS 0.4 04/22/2010 1820   BASOSABS 0.0 04/22/2010 1820    BMET    Component Value Date/Time   NA 141 07/04/2014 0332   K 4.3 07/04/2014 0332   CL 103 07/04/2014 0332   CO2 31 07/04/2014 0332   GLUCOSE 142* 07/04/2014 0332   BUN 18 07/04/2014 0332   CREATININE 1.19* 07/04/2014 0332   CREATININE 1.44* 09/27/2013 1137   CALCIUM 8.7* 07/04/2014 0332   GFRNONAA 43* 07/04/2014 0332   GFRNONAA 36* 09/27/2013 1137   GFRAA 50* 07/04/2014 0332   GFRAA 41* 09/27/2013 1137     Discharge Instructions:   The patient is discharged to home with extensive instructions on wound care and progressive ambulation.  They are instructed not to drive or perform any heavy lifting until returning to see the physician in his office.  Discharge Instructions    Call MD for:  redness, tenderness, or signs of infection (pain, swelling, bleeding, redness, odor or green/yellow discharge around incision site)    Complete by:  As directed      Call MD for:  severe or increased pain, loss or decreased feeling  in affected limb(s)    Complete by:  As directed      Call MD for:  temperature >100.5    Complete by:  As directed      Discharge wound care:    Complete by:  As directed   You may remove dressing from groin tomorrow. Wash groin daily with soap and dry. You do not have to apply a dressing.     Driving Restrictions    Complete by:  As directed   No driving for 1 week     Increase activity slowly    Complete by:  As directed   Walk with assistance use walker or cane as needed     Lifting restrictions    Complete by:  As directed   No  lifting for 1 week.     Resume previous diet    Complete by:  As directed            Discharge Diagnosis:  PVD Left Leg Pain PVD with left foor rest pain  Secondary Diagnosis: Patient Active Problem List   Diagnosis Date Noted  . PVD (peripheral vascular disease) 07/02/2014  . Left foot pain 05/30/2014  . Pain in joint, ankle and foot 05/23/2014  . Seborrheic wart 09/27/2013  . Hypoglycemia associated with type 2 diabetes mellitus 09/27/2013  . Chronic kidney disease (CKD), stage III (moderate) 09/27/2013  . Shoulder pain, right 04/19/2013  . Folliculitis 35/36/1443  . Preventative health care 12/29/2012  . Heart failure, chronic systolic 15/40/0867  . SPINAL STENOSIS, LUMBAR 07/23/2007  . Essential hypertension 09/09/2006  . HIP PAIN, BILATERAL 03/03/2006  . DYSPHAGIA 03/03/2006  . 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 Dyslipidemia 10/15/2005  . CORONARY ARTERY DISEASE 10/15/2005  . Peripheral vascular disease 10/15/2005  . Disorder resulting from impaired renal function 10/15/2005  . Psoriasis 10/15/2005  . OSTEOPENIA 10/15/2005  . Diabetes mellitus type 2 with complications 61/95/0932   Past Medical History  Diagnosis Date  . DM (diabetes mellitus)     insulin dependent  . Lumbar spinal stenosis   . History of colonic polyps   . Diverticulosis   . Nephrolithiasis     hx of  . HTN (hypertension)   . HLD (hyperlipidemia)   . PVD (peripheral vascular disease)     ABI indicated moderated reduction arterial flow on the left.  Marland Kitchen CAD (coronary artery disease) 2006    s/p Quadrupal CABG 2006  . MI (myocardial infarction) 2003    . [place]  . Tricuspid regurgitation 2009    moderate to severe, EF 55%  . Psoriasis   . OA (osteoarthritis)   . CKD (chronic kidney disease) stage 4, GFR 15-29 ml/min     fluctuating between stage 3 and 4 depending on GFR  . Osteopenia 2010    T score of -1.8  . CHF (congestive heart failure) 2010    EF 30-35% from Echo 06/2008   . MYOCARDIAL INFARCTION, HX OF 09/09/2006    Qualifier: Diagnosis of  By: Marinda Elk MD, Sonia Side    . DIVERTICULOSIS OF COLON 04/01/2007    Qualifier: Diagnosis of  By: Nelson-Smith CMA (AAMA), [name]    . NEPHROLITHIASIS 04/01/2007    Qualifier: Diagnosis of  By: Harlon Ditty CMA (AAMA), [name]    . Mouth ulcer adjacent to jaw bone 08/22/2010    S/p removal by ENT 09/2010        Medication List    STOP taking these medications  nitroGLYCERIN 0.4 MG SL tablet  Commonly known as:  NITROSTAT      TAKE these medications        ACCU-CHEK FASTCLIX LANCETS Misc  Check blood sugar 3 times a day     ONETOUCH DELICA LANCETS 01S Misc  CHECK BLOOD SUGAR THREE TIMES DAILY     ACCU-CHEK NANO SMARTVIEW W/DEVICE Kit  Check blood sugar 3 times a day     aspirin EC 81 MG tablet  Take 81 mg by mouth daily.     atenolol 25 MG tablet  Commonly known as:  TENORMIN  Take 0.5 tablets (12.5 mg total) by mouth daily.     betamethasone dipropionate 0.05 % lotion  Apply topically 2 (two) times daily.     CALCIUM 600 + D PO  Take 1 tablet by mouth 2 (two) times daily.     Clobetasol Prop Crea-Coal Tar 0.05 & 2.3 % Kit  To apply twice daily on the affected area.     clopidogrel 75 MG tablet  Commonly known as:  PLAVIX  Take 1 tablet (75 mg total) by mouth daily with breakfast.     CRESTOR 10 MG tablet  Generic drug:  rosuvastatin  TAKE 1 TABLET EVERY DAY (DOSE DECREASE)     esomeprazole 20 MG capsule  Commonly known as:  NEXIUM  Take 1 capsule (20 mg total) by mouth daily before breakfast.     furosemide 40 MG tablet  Commonly known as:  LASIX  Take 1 tablet (40 mg total) by mouth daily.     glucose blood test strip  Use as instructed     Insulin Glargine 100 UNIT/ML Solostar Pen  Commonly known as:  LANTUS SOLOSTAR  Inject 44 Units into the skin daily at 10 pm.     Insulin Pen Needle 32G X 4 MM Misc  Use with insulin pen once daily     insulin regular 100 units/mL injection    Commonly known as:  NOVOLIN R  INJECT  13 UNITS SUBCUTANEOUSLY WITH BREAKFAST,LUNCH, DINNER.  Do not take if Blood sugar less than 80.     Insulin Syringe-Needle U-100 31G X 15/64" 0.3 ML Misc  Use to inject mealtime insulin 3 times a day Dx code 250.00 insulin requiring     potassium chloride 10 MEQ tablet  Commonly known as:  K-DUR  TAKE 1 TABLET TWICE DAILY     traMADol 50 MG tablet  Commonly known as:  ULTRAM  50 mg every 12 hours as needed.     valsartan 320 MG tablet  Commonly known as:  DIOVAN  Take 1 tablet (320 mg total) by mouth daily.        Disposition: Home  Patient's condition: is Good  Follow up: 1. Dr. Scot Dock in 4 weeks   Virgina Jock, PA-C [facility] [phone] 07/06/2014  11:20 AM

## 2014-07-08 ENCOUNTER — Other Ambulatory Visit: Payer: Self-pay | Admitting: Internal Medicine

## 2014-07-12 ENCOUNTER — Encounter: Payer: Medicare HMO | Admitting: Vascular Surgery

## 2014-07-12 ENCOUNTER — Encounter (HOSPITAL_COMMUNITY): Payer: Medicare HMO

## 2014-07-12 NOTE — Telephone Encounter (Signed)
Rx called in to pharmacy. 

## 2014-07-25 ENCOUNTER — Encounter: Payer: Self-pay | Admitting: Internal Medicine

## 2014-07-25 ENCOUNTER — Ambulatory Visit (INDEPENDENT_AMBULATORY_CARE_PROVIDER_SITE_OTHER): Payer: Commercial Managed Care - HMO | Admitting: Internal Medicine

## 2014-07-25 VITALS — BP 146/50 | HR 56 | Temp 97.9°F | Ht 60.0 in | Wt 140.9 lb

## 2014-07-25 DIAGNOSIS — Z7982 Long term (current) use of aspirin: Secondary | ICD-10-CM

## 2014-07-25 DIAGNOSIS — E118 Type 2 diabetes mellitus with unspecified complications: Secondary | ICD-10-CM

## 2014-07-25 DIAGNOSIS — H1132 Conjunctival hemorrhage, left eye: Secondary | ICD-10-CM | POA: Diagnosis not present

## 2014-07-25 DIAGNOSIS — Z79899 Other long term (current) drug therapy: Secondary | ICD-10-CM

## 2014-07-25 LAB — GLUCOSE, CAPILLARY: GLUCOSE-CAPILLARY: 246 mg/dL — AB (ref 65–99)

## 2014-07-25 NOTE — Progress Notes (Signed)
Patient ID: [name], female   DOB: Aug 24, 1940, 74 y.o.   MRN: [id number]    Subjective:   Patient ID: [name] female   DOB: August 11, 1940 74 y.o.   MRN: [id number]  HPI: [name] is a 74 y.o. woman who presents with left eye redness since yesterday morning.    She does not recall any injury or trauma to the eye. She takes aspirin and plavix. She has no allergies, and does not wear contacts. No change in vision, pain in the eye, no photophobia, or discharge, no fevers.   Pl see problem based assessment and plan.    Past Medical History  Diagnosis Date  . DM (diabetes mellitus)     insulin dependent  . Lumbar spinal stenosis   . History of colonic polyps   . Diverticulosis   . Nephrolithiasis     hx of  . HTN (hypertension)   . HLD (hyperlipidemia)   . PVD (peripheral vascular disease)     ABI indicated moderated reduction arterial flow on the left.  Marland Kitchen CAD (coronary artery disease) 2006    s/p Quadrupal CABG 2006  . MI (myocardial infarction) 2003    . [place]  . Tricuspid regurgitation 2009    moderate to severe, EF 55%  . Psoriasis   . OA (osteoarthritis)   . CKD (chronic kidney disease) stage 4, GFR 15-29 ml/min     fluctuating between stage 3 and 4 depending on GFR  . Osteopenia 2010    T score of -1.8  . CHF (congestive heart failure) 2010    EF 30-35% from Echo 06/2008  . MYOCARDIAL INFARCTION, HX OF 09/09/2006    Qualifier: Diagnosis of  By: Marinda Elk MD, Sonia Side    . DIVERTICULOSIS OF COLON 04/01/2007    Qualifier: Diagnosis of  By: Nelson-Smith CMA (AAMA), [name]    . NEPHROLITHIASIS 04/01/2007    Qualifier: Diagnosis of  By: Harlon Ditty CMA (AAMA), [name]    . Mouth ulcer adjacent to jaw bone 08/22/2010    S/p removal by ENT 09/2010    Current Outpatient Prescriptions  Medication Sig Dispense Refill  . ACCU-CHEK FASTCLIX LANCETS MISC Check blood sugar 3 times a day 102 each 12  . aspirin EC 81 MG tablet Take 81 mg by mouth daily.      Marland Kitchen atenolol (TENORMIN) 25 MG tablet Take 0.5 tablets (12.5 mg total) by mouth daily. 90 tablet 2  . betamethasone dipropionate 0.05 % lotion Apply topically 2 (two) times daily. 60 mL 0  . Blood Glucose Monitoring Suppl (ACCU-CHEK NANO SMARTVIEW) W/DEVICE KIT Check blood sugar 3 times a day 1 kit 0  . Calcium Carb-Cholecalciferol (CALCIUM 600 + D PO) Take 1 tablet by mouth 2 (two) times daily.    . Clobetasol Prop Crea-Coal Tar 0.05 & 2.3 % KIT To apply twice daily on the affected area. 1 kit 0  . clopidogrel (PLAVIX) 75 MG tablet Take 1 tablet (75 mg total) by mouth daily with breakfast. 30 tablet 11  . CRESTOR 10 MG tablet TAKE 1 TABLET EVERY DAY (DOSE DECREASE) (Patient taking differently: TAKE 1 TABLET EVERY evening (DOSE DECREASE)) 90 tablet 3  . esomeprazole (NEXIUM) 20 MG capsule Take 1 capsule (20 mg total) by mouth daily before breakfast. 90 capsule 2  . furosemide (LASIX) 40 MG tablet Take 1 tablet (40 mg total) by mouth daily. 90 tablet 4  . glucose blood test strip Use as instructed 100 each 12  . Insulin  Glargine (LANTUS SOLOSTAR) 100 UNIT/ML Solostar Pen Inject 44 Units into the skin daily at 10 pm. 45 mL 11  . Insulin Pen Needle 32G X 4 MM MISC Use with insulin pen once daily 100 each 3  . insulin regular (NOVOLIN R) 100 units/mL injection INJECT  13 UNITS SUBCUTANEOUSLY WITH BREAKFAST,LUNCH, DINNER.  Do not take if Blood sugar less than 80. 40 mL 2  . Insulin Syringe-Needle U-100 31G X 15/64" 0.3 ML MISC Use to inject mealtime insulin 3 times a day Dx code 250.00 insulin requiring 300 each 3  . ONETOUCH DELICA LANCETS 80D MISC CHECK BLOOD SUGAR THREE TIMES DAILY 300 each 12  . potassium chloride (K-DUR) 10 MEQ tablet TAKE 1 TABLET TWICE DAILY 180 tablet 0  . traMADol (ULTRAM) 50 MG tablet TAKE 1 TABLET EVERY 12 HOURS AS NEEDED   60 tablet 0  . valsartan (DIOVAN) 320 MG tablet Take 1 tablet (320 mg total) by mouth daily. 90 tablet 2   No current facility-administered medications  for this visit.   Family History  Problem Relation Age of Onset  . Cirrhosis Brother   . Heart disease Brother   . Heart attack Brother   . Hypertension Mother   . Hyperlipidemia Mother   . Hyperlipidemia Son   . Hypertension Son    History   Social History  . Marital Status: Widowed    Spouse Name: N/A  . Number of Children: N/A  . Years of Education: N/A   Social History Main Topics  . Smoking status: Never Smoker   . Smokeless tobacco: Not on file  . Alcohol Use: No  . Drug Use: No  . Sexual Activity: Not on file   Other Topics Concern  . None   Social History Narrative   Retired Event organiser   Widow/Widower   lives in [place]   4 kids (2 sons, 2 daughters) one son died many years ago in car accident.  other kids are all in [place].   Has friend that helps take care of her   Review of Systems: Review of Systems  Constitutional: Negative.  Negative for fever.  Eyes: Positive for redness. Negative for blurred vision, double vision, photophobia, pain and discharge.       Left eye redness  Respiratory: Negative.   Gastrointestinal: Negative.     Objective:  Physical Exam: Filed Vitals:   07/25/14 1054  BP: 146/50  Pulse: 56  Temp: 97.9 F (36.6 C)  TempSrc: Oral  Height: 5' (1.524 m)  Weight: 140 lb 14.4 oz (63.912 kg)  SpO2: 97%   Physical Exam  Constitutional: She appears well-developed and well-nourished.  Eyes:  Diffuse red hemorrhage noted in the entire conjunctiva of the left eye  EOMI, PERRLA, VA 20/30 in both eyes Visual fields intact No photophobia, lid edema, or discharge or pain on palpation    Cardiovascular: Normal rate, regular rhythm and normal heart sounds.   No murmur heard. Pulmonary/Chest: Effort normal and breath sounds normal.     Assessment & Plan:  Pl see problem based assessment and plan

## 2014-07-25 NOTE — Progress Notes (Signed)
Internal Medicine Clinic Attending  I saw and evaluated the patient.  I personally confirmed the key portions of the history and exam documented by Dr. Tiburcio Pea and I reviewed pertinent patient test results.  The assessment, diagnosis, and plan were formulated together and I agree with the documentation in the resident's note.  74 year old female, with ocular hemorrhage - she has no symptoms (no pain, no swelling, no loss of visual acuity, no discharge, no photosensitivity), not on anticoagulation, however on exam - her entire sclera is deep red in color, with dense hemorrhage. Her pupils are equal and reactive to light and extraocular eye movements are normal. She is on aspirin and plavix. Will send her to Dr [facility] ASAP. [name] MD MPH 07/25/2014 11:52 AM

## 2014-07-26 NOTE — Assessment & Plan Note (Signed)
History and PE consistent with ocular/conjunctival hemorrhage of left eye  We have urgently referred her to opthalmology for further management, and patient already has a preferred one- this afternoon  Symptomatic treatment also advised like compresses.

## 2014-07-31 ENCOUNTER — Encounter: Payer: Self-pay | Admitting: Vascular Surgery

## 2014-08-02 ENCOUNTER — Ambulatory Visit (HOSPITAL_COMMUNITY)
Admit: 2014-08-02 | Discharge: 2014-08-02 | Disposition: A | Discharge status: Home / Self Care | Payer: Commercial Managed Care - HMO | Source: Ambulatory Visit | Attending: Vascular Surgery | Admitting: Vascular Surgery

## 2014-08-02 ENCOUNTER — Encounter: Payer: Self-pay | Admitting: Vascular Surgery

## 2014-08-02 ENCOUNTER — Ambulatory Visit (INDEPENDENT_AMBULATORY_CARE_PROVIDER_SITE_OTHER): Payer: Commercial Managed Care - HMO | Admitting: Vascular Surgery

## 2014-08-02 VITALS — BP 136/66 | HR 52 | Ht 60.0 in | Wt 139.7 lb

## 2014-08-02 DIAGNOSIS — Z9862 Peripheral vascular angioplasty status: Secondary | ICD-10-CM

## 2014-08-02 DIAGNOSIS — I739 Peripheral vascular disease, unspecified: Secondary | ICD-10-CM

## 2014-08-02 DIAGNOSIS — Z9889 Other specified postprocedural states: Secondary | ICD-10-CM

## 2014-08-02 DIAGNOSIS — Z48812 Encounter for surgical aftercare following surgery on the circulatory system: Secondary | ICD-10-CM

## 2014-08-02 MED ORDER — CLOPIDOGREL BISULFATE 75 MG PO TABS: 75.0000 mg | ORAL_TABLET | Freq: Every day | ORAL | Status: DC

## 2014-08-02 NOTE — Progress Notes (Signed)
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  HPI: [name] is a 74 y.o. female wwho had presented with progressive ischemia of the left lower extremity and significant rest pain. She had no left femoral pulse. Of note her ABI was 25% and on exam she had evidence of multilevel arterial occlusive disease.  She was taken to the peripheral vascular lab on 07/03/2014. 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 I used a 6 mm x 29 mm stent. There was no residual stenosis.  She has done well since the procedure and denies rest pain in her left foot now. He is not a smoker. She is on 81 mg of aspirin. She is on Plavix. She is on a statin.  Current Outpatient Prescriptions  Medication Sig Dispense Refill  . ACCU-CHEK FASTCLIX LANCETS MISC Check blood sugar 3 times a day 102 each 12  . aspirin EC 81 MG tablet Take 81 mg by mouth daily.    Marland Kitchen atenolol (TENORMIN) 25 MG tablet Take 0.5 tablets (12.5 mg total) by mouth daily. 90 tablet 2  . betamethasone dipropionate 0.05 % lotion Apply topically 2 (two) times daily. 60 mL 0  . Blood Glucose Monitoring Suppl (ACCU-CHEK NANO SMARTVIEW) W/DEVICE KIT Check blood sugar 3 times a day 1 kit 0  . Calcium Carb-Cholecalciferol (CALCIUM 600 + D PO) Take 1 tablet by mouth 2 (two) times daily.    . Clobetasol Prop Crea-Coal Tar 0.05 & 2.3 % KIT To apply twice daily on the affected area. 1 kit 0  . clopidogrel (PLAVIX) 75 MG tablet Take 1 tablet (75 mg total) by mouth daily with breakfast. 30 tablet 11  . CRESTOR 10 MG tablet TAKE 1 TABLET EVERY DAY (DOSE DECREASE) (Patient taking differently: TAKE 1 TABLET EVERY evening (DOSE DECREASE)) 90 tablet 3  . esomeprazole (NEXIUM) 20 MG capsule Take 1 capsule (20 mg total) by mouth daily before breakfast. 90 capsule 2  .  furosemide (LASIX) 40 MG tablet Take 1 tablet (40 mg total) by mouth daily. 90 tablet 4  . glucose blood test strip Use as instructed 100 each 12  . Insulin Glargine (LANTUS SOLOSTAR) 100 UNIT/ML Solostar Pen Inject 44 Units into the skin daily at 10 pm. 45 mL 11  . Insulin Pen Needle 32G X 4 MM MISC Use with insulin pen once daily 100 each 3  . insulin regular (NOVOLIN R) 100 units/mL injection INJECT  13 UNITS SUBCUTANEOUSLY WITH BREAKFAST,LUNCH, DINNER.  Do not take if Blood sugar less than 80. 40 mL 2  . Insulin Syringe-Needle U-100 31G X 15/64" 0.3 ML MISC Use to inject mealtime insulin 3 times a day Dx code 250.00 insulin requiring 300 each 3  . ONETOUCH DELICA LANCETS 89H MISC CHECK BLOOD SUGAR THREE TIMES DAILY 300 each 12  . potassium chloride (K-DUR) 10 MEQ tablet TAKE 1 TABLET TWICE DAILY 180 tablet 0  . traMADol (ULTRAM) 50 MG tablet TAKE 1 TABLET EVERY 12 HOURS AS NEEDED   60 tablet 0  . valsartan (DIOVAN) 320 MG tablet Take 1 tablet (320 mg total) by mouth daily. 90 tablet 2   No current facility-administered medications for this visit.   REVIEW OF SYSTEMS: Valu.Nieves ] denotes positive finding; [  ] denotes negative finding  CARDIOVASCULAR:  [ ]   chest pain   [ ]  dyspnea on exertion    CONSTITUTIONAL:  [ ]  fever   [ ]  chills  PHYSICAL EXAM: Filed Vitals:   08/02/14 1423 08/02/14 1427  BP: 156/76 136/66  Pulse: 52   Height: 5' (1.524 m)   Weight: 139 lb 11.2 oz (63.368 kg)   SpO2: 96%    GENERAL: The patient is a well-nourished female, in no acute distress. The vital signs are documented above. CARDIOVASCULAR: There is a regular rate and rhythm. PULMONARY: There is good air exchange bilaterally without wheezing or rales. She has palpable femoral pulses. Both feet are warm and well-perfused. I have reviewed her arterial Doppler study today which shows that she has monophasic Doppler signals in the dorsalis pedis and posterior tibial positions bilaterally. On the right she has an ABI  of 91%. On the left she has an ABI of 71%.  I have independently interpreted her arterial Doppler study today which shows monophasic Doppler signals in the dorsalis pedis and posterior tibial positions bilaterally. ABI on the right is 91% ABI on the left is 71%. Of note her preop ABI in the left was 25%. Thus there has been a significant improvement.  MEDICAL ISSUES:  STATUS POST LEFT COMMON ILIAC ARTERY STENT FOR A CHRONIC TOTAL OCCLUSION: The patient is doing well status post angioplasty and stenting of an occluded left common iliac artery. She is on aspirin and is on a statin. She is on Plavix. I have encouraged her to remain as active as possible. She is not a smoker. We will put her on our protocol and she will be followed by the nurse practitioner. We will see her back in 3 months. She knows to call sooner she has problems.  [name] [facility]: [phone]

## 2014-08-10 ENCOUNTER — Other Ambulatory Visit: Payer: Self-pay | Admitting: Internal Medicine

## 2014-08-10 NOTE — Telephone Encounter (Signed)
Rx called in 

## 2014-08-22 ENCOUNTER — Other Ambulatory Visit: Payer: Self-pay | Admitting: Internal Medicine

## 2014-08-22 DIAGNOSIS — E118 Type 2 diabetes mellitus with unspecified complications: Secondary | ICD-10-CM

## 2014-08-22 MED ORDER — INSULIN REGULAR HUMAN 100 UNIT/ML IJ SOLN: INTRAMUSCULAR | Status: DC

## 2014-08-22 NOTE — Telephone Encounter (Signed)
Pt request refill for one vial of Novolin R while she waits for Mail order.

## 2014-08-22 NOTE — Telephone Encounter (Signed)
Patient normally gets her insulin through mail order but has ran out and is requesting a 30 day supply through [facility].

## 2014-08-31 ENCOUNTER — Ambulatory Visit (INDEPENDENT_AMBULATORY_CARE_PROVIDER_SITE_OTHER): Payer: PPO | Admitting: Internal Medicine

## 2014-08-31 ENCOUNTER — Encounter: Payer: Self-pay | Admitting: Internal Medicine

## 2014-08-31 VITALS — BP 123/48 | HR 58 | Temp 98.1°F | Ht 60.0 in | Wt 142.4 lb

## 2014-08-31 DIAGNOSIS — N183 Chronic kidney disease, stage 3 unspecified: Secondary | ICD-10-CM

## 2014-08-31 DIAGNOSIS — Z794 Long term (current) use of insulin: Secondary | ICD-10-CM

## 2014-08-31 DIAGNOSIS — E11649 Type 2 diabetes mellitus with hypoglycemia without coma: Secondary | ICD-10-CM | POA: Diagnosis not present

## 2014-08-31 DIAGNOSIS — E1122 Type 2 diabetes mellitus with diabetic chronic kidney disease: Secondary | ICD-10-CM | POA: Diagnosis not present

## 2014-08-31 DIAGNOSIS — L409 Psoriasis, unspecified: Secondary | ICD-10-CM

## 2014-08-31 DIAGNOSIS — E118 Type 2 diabetes mellitus with unspecified complications: Secondary | ICD-10-CM

## 2014-08-31 DIAGNOSIS — H1132 Conjunctival hemorrhage, left eye: Secondary | ICD-10-CM

## 2014-08-31 LAB — GLUCOSE, CAPILLARY: GLUCOSE-CAPILLARY: 218 mg/dL — AB (ref 65–99)

## 2014-08-31 LAB — POCT GLYCOSYLATED HEMOGLOBIN (HGB A1C): Hemoglobin A1C: 7.3

## 2014-08-31 NOTE — Patient Instructions (Addendum)
HemoglobinA1c this visit 7.3 Insulin dosage changes: LANTUS 45 Units at night daily NOVOLIN R 14 Units before meals three times a day  We will try to find a new dermatologist for you.  Return to clinic in 3 months.  [name] MD MPH 08/31/2014 11:48 AM [facility] [address] [place], [place] [address]. Ph: [phone] Hours: 8 am - 5 pm

## 2014-09-01 ENCOUNTER — Other Ambulatory Visit: Payer: Self-pay | Admitting: Internal Medicine

## 2014-09-01 DIAGNOSIS — E118 Type 2 diabetes mellitus with unspecified complications: Secondary | ICD-10-CM

## 2014-09-01 MED ORDER — INSULIN GLARGINE 100 UNIT/ML SOLOSTAR PEN: 44.0000 [IU] | PEN_INJECTOR | Freq: Every day | SUBCUTANEOUS | Status: DC

## 2014-09-01 NOTE — Telephone Encounter (Signed)
Pt is no longer with [facility], changing pharmacy's  She likes a 3 month supply

## 2014-09-01 NOTE — Telephone Encounter (Signed)
Per the Patient She was seen yesterday and gave a list of medications to Dr. Ellwood Dense to be filled, but she states she really needs her Lantus Solostar to be filled ASAP as she is almost out.  Please call meds in to the [facility] on [address]

## 2014-09-02 NOTE — Progress Notes (Signed)
   Subjective:    Patient ID: [name], female    DOB: 1940/05/05, 74 y.o.   MRN: [id number]  HPI [name] is a 74 year old lady with DM2 on insulin, CKD, hypertension and psoriasis. She comes in for a routine follow up. Her last visit was for acute reason - subconjunctival hemorrhage which has since resolved without therapy. She suffered loss of her granddaughter in may 2016 in a car accident, she is extremely emotional about that this visit.  She is compliant with her diabetes regimen - currently taking 43 units of lantus at bedtime and 13 units of novolin R at mealtime. She reports no bouts of hypoglycemia. She has brought her glucometer log, and she has only one low at 69 when she did not eat food. Other than that, her sugars range from 120s to 300s. Her current A1c is stable at 7.3. She is not happy with [facility] and would like to change services if possible. She was taking methotrexate for psoriasis which helped her, but has stopped taking it now.    Review of Systems  Constitutional: Negative.   Respiratory: Negative.   Cardiovascular: Negative.   Neurological: Negative.   Psychiatric/Behavioral:       Depressed mood       Objective:   Physical Exam  Constitutional: She is oriented to person, place, and time. She appears well-developed and well-nourished. No distress.  HENT:  Head: Normocephalic and atraumatic.  Psoriasis on scalp  Eyes: Conjunctivae are normal. Pupils are equal, round, and reactive to light.  Neck: Normal range of motion.  Cardiovascular: Normal rate, regular rhythm and normal heart sounds.   No murmur heard. Pulmonary/Chest: Effort normal and breath sounds normal. No respiratory distress. She has no wheezes. She has no rales. She exhibits no tenderness.  Abdominal: Soft.  Musculoskeletal: She exhibits no edema.  Neurological: She is alert and oriented to person, place, and time.  Skin: Skin is warm. She is not diaphoretic.  Psoriasis rash  on scalp, elbows, chest, and thighs.      Assessment & Plan:  Please see problem based charting.

## 2014-09-02 NOTE — Assessment & Plan Note (Signed)
Stable.  Lab Results  Component Value Date   CREATININE 1.19* 07/04/2014   CREATININE 1.35* 07/03/2014   CREATININE 1.41* 07/02/2014

## 2014-09-02 NOTE — Assessment & Plan Note (Signed)
Will refer to another dermatologist.

## 2014-09-02 NOTE — Assessment & Plan Note (Signed)
Resolved. Was sent to Dr Katy Fitch because the hemorrhage was extensive and mimicked scleritis. However, notes from Dr Katy Fitch have been obtained and scanned in the chart and it was confirmed that she had subconjunctival hemorrhage.

## 2014-09-02 NOTE — Assessment & Plan Note (Signed)
Much less hypoglycemia.  - Increase Lantus to 45 units  - Increase Novolin R to 14 units with food three times a day.  Extensively discussed with patient that if she sees hypoglycemic readings of blood sugar less than 70, she should cut back on insulin. Patient is intelligent and verbalized understanding.

## 2014-09-09 ENCOUNTER — Other Ambulatory Visit: Payer: Self-pay | Admitting: Internal Medicine

## 2014-10-06 ENCOUNTER — Other Ambulatory Visit: Payer: Self-pay | Admitting: Internal Medicine

## 2014-10-06 DIAGNOSIS — K219 Gastro-esophageal reflux disease without esophagitis: Secondary | ICD-10-CM

## 2014-10-06 DIAGNOSIS — I10 Essential (primary) hypertension: Secondary | ICD-10-CM

## 2014-10-06 NOTE — Telephone Encounter (Signed)
All other meds have refills

## 2014-10-06 NOTE — Telephone Encounter (Signed)
Pt called requesting Novolin, Nexium, Atenolol, Tramadol, Valsartan, Potassium, Plavix and Crestor to be filled @ [facility]. Pt want 3 month supply.

## 2014-10-09 MED ORDER — ATENOLOL 25 MG PO TABS: 12.5000 mg | ORAL_TABLET | Freq: Every day | ORAL | Status: DC

## 2014-10-09 MED ORDER — TRAMADOL HCL 50 MG PO TABS: ORAL_TABLET | ORAL | Status: DC

## 2014-10-09 MED ORDER — INSULIN REGULAR HUMAN 100 UNIT/ML IJ SOLN: 14.0000 [IU] | Freq: Three times a day (TID) | INTRAMUSCULAR | Status: DC

## 2014-10-09 MED ORDER — POTASSIUM CHLORIDE CRYS ER 10 MEQ PO TBCR: 10.0000 meq | EXTENDED_RELEASE_TABLET | Freq: Two times a day (BID) | ORAL | Status: DC

## 2014-10-09 NOTE — Telephone Encounter (Signed)
Rx called in 

## 2014-10-09 NOTE — Telephone Encounter (Signed)
Patient needs to schedule appointment for labs soon.

## 2014-10-10 ENCOUNTER — Other Ambulatory Visit: Payer: Self-pay | Admitting: Internal Medicine

## 2014-10-10 DIAGNOSIS — E118 Type 2 diabetes mellitus with unspecified complications: Secondary | ICD-10-CM

## 2014-10-10 NOTE — Telephone Encounter (Signed)
Pt requesting refill on her testing strips/pens/needles from [facility] on [address].

## 2014-10-11 MED ORDER — GLUCOSE BLOOD VI STRP
ORAL_STRIP | Status: DC
Start: 2014-10-11 — End: 2014-11-06

## 2014-10-11 MED ORDER — INSULIN PEN NEEDLE 32G X 4 MM MISC: Status: DC

## 2014-10-16 ENCOUNTER — Other Ambulatory Visit: Payer: Self-pay | Admitting: Internal Medicine

## 2014-10-16 DIAGNOSIS — K219 Gastro-esophageal reflux disease without esophagitis: Secondary | ICD-10-CM

## 2014-10-16 DIAGNOSIS — I10 Essential (primary) hypertension: Secondary | ICD-10-CM

## 2014-10-16 NOTE — Telephone Encounter (Signed)
Pt called requesting Nexium, Furosemide, Crestor, Plavix and one touch test strip to be filled @ [facility].

## 2014-10-17 MED ORDER — ROSUVASTATIN CALCIUM 10 MG PO TABS: 10.0000 mg | ORAL_TABLET | Freq: Every day | ORAL | Status: DC

## 2014-10-17 MED ORDER — FUROSEMIDE 40 MG PO TABS: 40.0000 mg | ORAL_TABLET | Freq: Every day | ORAL | Status: DC

## 2014-10-17 NOTE — Telephone Encounter (Signed)
Patient has been on pantoprazole recently not esmoprazole, does she want to change back?

## 2014-10-20 ENCOUNTER — Ambulatory Visit: Payer: PPO | Admitting: Internal Medicine

## 2014-10-27 ENCOUNTER — Ambulatory Visit (INDEPENDENT_AMBULATORY_CARE_PROVIDER_SITE_OTHER): Payer: PPO | Admitting: Internal Medicine

## 2014-10-27 DIAGNOSIS — Z23 Encounter for immunization: Secondary | ICD-10-CM

## 2014-10-27 DIAGNOSIS — E1122 Type 2 diabetes mellitus with diabetic chronic kidney disease: Secondary | ICD-10-CM | POA: Diagnosis not present

## 2014-10-27 DIAGNOSIS — I129 Hypertensive chronic kidney disease with stage 1 through stage 4 chronic kidney disease, or unspecified chronic kidney disease: Secondary | ICD-10-CM

## 2014-10-27 DIAGNOSIS — Z0000 Encounter for general adult medical examination without abnormal findings: Secondary | ICD-10-CM

## 2014-10-27 DIAGNOSIS — E1165 Type 2 diabetes mellitus with hyperglycemia: Secondary | ICD-10-CM

## 2014-10-27 DIAGNOSIS — Z794 Long term (current) use of insulin: Secondary | ICD-10-CM

## 2014-10-27 DIAGNOSIS — N183 Chronic kidney disease, stage 3 (moderate): Secondary | ICD-10-CM | POA: Diagnosis not present

## 2014-10-27 DIAGNOSIS — E118 Type 2 diabetes mellitus with unspecified complications: Secondary | ICD-10-CM

## 2014-10-27 DIAGNOSIS — I10 Essential (primary) hypertension: Secondary | ICD-10-CM

## 2014-10-27 MED ORDER — METFORMIN HCL 500 MG PO TABS: 500.0000 mg | ORAL_TABLET | Freq: Two times a day (BID) | ORAL | Status: DC

## 2014-10-27 MED ORDER — INSULIN REGULAR HUMAN 100 UNIT/ML IJ SOLN: 14.0000 [IU] | Freq: Three times a day (TID) | INTRAMUSCULAR | Status: DC

## 2014-10-27 NOTE — Patient Instructions (Addendum)
[name] it was nice meeting you today. Please return for a follow-up visit in 1 month.

## 2014-10-28 NOTE — Progress Notes (Signed)
Patient ID: [name], female   DOB: 1940-03-25, 74 y.o.   MRN: [id number]   Subjective:   Patient ID: [name] female   DOB: [birth year] 74 y.o.   MRN: [id number]  HPI: [name] is a 74 y.o. female with a past medical history of conditions listed below presenting to the clinic for follow-up of her diabetes. Please see assessment and plan for the status of the patient's chronic medical conditions.    Past Medical History  Diagnosis Date  . DM (diabetes mellitus)     insulin dependent  . Lumbar spinal stenosis   . History of colonic polyps   . Diverticulosis   . Nephrolithiasis     hx of  . HTN (hypertension)   . HLD (hyperlipidemia)   . PVD (peripheral vascular disease)     ABI indicated moderated reduction arterial flow on the left.  Marland Kitchen CAD (coronary artery disease) 2006    s/p Quadrupal CABG 2006  . MI (myocardial infarction) 2003    . [place]  . Tricuspid regurgitation 2009    moderate to severe, EF 55%  . Psoriasis   . OA (osteoarthritis)   . CKD (chronic kidney disease) stage 4, GFR 15-29 ml/min     fluctuating between stage 3 and 4 depending on GFR  . Osteopenia 2010    T score of -1.8  . CHF (congestive heart failure) 2010    EF 30-35% from Echo 06/2008  . MYOCARDIAL INFARCTION, HX OF 09/09/2006    Qualifier: Diagnosis of  By: Marinda Elk MD, Sonia Side    . DIVERTICULOSIS OF COLON 04/01/2007    Qualifier: Diagnosis of  By: Nelson-Smith CMA (AAMA), [name]    . NEPHROLITHIASIS 04/01/2007    Qualifier: Diagnosis of  By: Harlon Ditty CMA (AAMA), [name]    . Mouth ulcer adjacent to jaw bone 08/22/2010    S/p removal by ENT 09/2010    Current Outpatient Prescriptions  Medication Sig Dispense Refill  . ACCU-CHEK FASTCLIX LANCETS MISC Check blood sugar 3 times a day 102 each 12  . aspirin EC 81 MG tablet Take 81 mg by mouth daily.    Marland Kitchen atenolol (TENORMIN) 25 MG tablet Take 0.5 tablets (12.5 mg total) by mouth daily. 90 tablet 2  . betamethasone  dipropionate 0.05 % lotion Apply topically 2 (two) times daily. 60 mL 0  . Blood Glucose Monitoring Suppl (ACCU-CHEK NANO SMARTVIEW) W/DEVICE KIT Check blood sugar 3 times a day 1 kit 0  . Calcium Carb-Cholecalciferol (CALCIUM 600 + D PO) Take 1 tablet by mouth 2 (two) times daily.    . Clobetasol Prop Crea-Coal Tar 0.05 & 2.3 % KIT To apply twice daily on the affected area. 1 kit 0  . clopidogrel (PLAVIX) 75 MG tablet Take 1 tablet (75 mg total) by mouth daily with breakfast. 30 tablet 11  . clopidogrel (PLAVIX) 75 MG tablet Take 1 tablet (75 mg total) by mouth daily. 90 tablet 6  . esomeprazole (NEXIUM) 20 MG capsule Take 1 capsule (20 mg total) by mouth daily before breakfast. 90 capsule 2  . furosemide (LASIX) 40 MG tablet Take 1 tablet (40 mg total) by mouth daily. 90 tablet 0  . glucose blood test strip NANO SMARTVIEW, use to check blood sugar 3 to 4 times daily. diag code E11.8 insulin dependent 120 each 6  . Insulin Glargine (LANTUS SOLOSTAR) 100 UNIT/ML Solostar Pen Inject 44 Units into the skin daily at 10 pm. 45 mL 11  . Insulin  Pen Needle 32G X 4 MM MISC Use with insulin pen once daily. diag code E11.8 insulin dependent 100 each 3  . insulin regular (NOVOLIN R) 100 units/mL injection Inject 0.14 mLs (14 Units total) into the skin 3 (three) times daily before meals. 20 mL 2  . Insulin Syringe-Needle U-100 31G X 15/64" 0.3 ML MISC Use to inject mealtime insulin 3 times a day Dx code 250.00 insulin requiring 300 each 3  . metFORMIN (GLUCOPHAGE) 500 MG tablet Take 1 tablet (500 mg total) by mouth 2 (two) times daily with a meal. 180 tablet 0  . ONETOUCH DELICA LANCETS 47Q MISC CHECK BLOOD SUGAR THREE TIMES DAILY 300 each 12  . potassium chloride (K-DUR) 10 MEQ tablet TAKE 1 TABLET TWICE DAILY 180 tablet 0  . potassium chloride (K-DUR,KLOR-CON) 10 MEQ tablet Take 1 tablet (10 mEq total) by mouth 2 (two) times daily. 180 tablet 0  . rosuvastatin (CRESTOR) 10 MG tablet Take 1 tablet (10 mg  total) by mouth daily. 90 tablet 0  . traMADol (ULTRAM) 50 MG tablet TAKE 1 TABLET EVERY 12 HOURS AS NEEDED 60 tablet 0  . valsartan (DIOVAN) 320 MG tablet Take 1 tablet (320 mg total) by mouth daily. 90 tablet 2   No current facility-administered medications for this visit.   Family History  Problem Relation Age of Onset  . Cirrhosis Brother   . Heart disease Brother   . Heart attack Brother   . Hypertension Mother   . Hyperlipidemia Mother   . Hyperlipidemia Son   . Hypertension Son    Social History   Social History  . Marital Status: Widowed    Spouse Name: N/A  . Number of Children: N/A  . Years of Education: N/A   Social History Main Topics  . Smoking status: Never Smoker   . Smokeless tobacco: Not on file  . Alcohol Use: No  . Drug Use: No  . Sexual Activity: Not on file   Other Topics Concern  . Not on file   Social History Narrative   Retired Event organiser   Widow/Widower   lives in [place]   4 kids (2 sons, 2 daughters) one son died many years ago in car accident.  other kids are all in [place].   Has friend that helps take care of her   Review of Systems: Review of Systems  Constitutional: Negative for fever and chills.  HENT: Negative for ear pain.   Eyes: Negative for blurred vision and pain.  Respiratory: Negative for cough, shortness of breath and wheezing.   Cardiovascular: Negative for chest pain and leg swelling.  Gastrointestinal: Negative for nausea, vomiting, abdominal pain, diarrhea and constipation.  Genitourinary: Negative for dysuria, urgency and frequency.  Musculoskeletal: Negative for myalgias.  Skin: Negative for itching and rash.  Neurological: Negative for dizziness, sensory change, focal weakness and headaches.   Objective:  Physical Exam: There were no vitals filed for this visit. Physical Exam  Constitutional: She is oriented to person, place, and time. She appears well-developed and well-nourished. No  distress.  HENT:  Head: Normocephalic and atraumatic.  Eyes: EOM are normal. Pupils are equal, round, and reactive to light.  Neck: Neck supple. No tracheal deviation present.  Cardiovascular: Normal rate, regular rhythm and intact distal pulses.   Pulmonary/Chest: Effort normal. No respiratory distress. She has no wheezes. She has no rales.  Abdominal: Soft. Bowel sounds are normal. She exhibits no distension. There is no tenderness.  Musculoskeletal: She exhibits  no edema.  Neurological: She is alert and oriented to person, place, and time.  Skin: Skin is warm and dry.    Assessment & Plan:

## 2014-10-28 NOTE — Assessment & Plan Note (Signed)
Lab Results  Component Value Date   HGBA1C 7.3 08/31/2014   HGBA1C 6.8 04/20/2014   HGBA1C 7.4 12/27/2013     Assessment: Progress toward A1C goal:    above goal, A1c< 7 Comments: Asian is currently on Lantus 44 units daily and Novolin R 14 units 3 times a day. Patient is checking her blood glucose 3 times a day. Blood glucose meter showing an average value of 197, high 338, and low 69. She denies having any symptoms of hypoglycemia. Patient mentioned eating high carbohydrate foods such as potato salad.   Plan: Medications: Added metformin 500 mg 3 times a day to her regimen. Continue Lantus 44 units daily and Novolin R 14 units 3 times a day based on a correction scale (sensitive).  Home glucose monitoring:  Frequency:   3 times a day Timing:   before meals. Also advised to check her blood glucose if she experiences any symptoms of hypoglycemia such as diaphoresis, palpitations, or lightheadedness.  Instruction/counseling given: discussed diet Other plans:  -A1c due in 1 month. Repeat at next visit.

## 2014-10-28 NOTE — Assessment & Plan Note (Addendum)
BP Readings from Last 3 Encounters:  08/31/14 123/48  08/02/14 136/66  07/25/14 146/50    Lab Results  Component Value Date   NA 141 07/04/2014   K 4.3 07/04/2014   CREATININE 1.19* 07/04/2014    Assessment: Blood pressure control:  above goal, <140/90 Progress toward BP goal:   deteriorated  Comments: Patient's blood pressure at this visit is 153/46. She is currently on atenolol 12.5 mg daily, furosemide 40 mg daily, valsartan 320 mg daily. In addition, patient is taking potassium 10 mEq daily.  Plan: Medications:  continue current medications Educational resources provided:   Encouraged healthy eating and exercise. Reduce dietary sodium intake. Other plans: Recheck blood pressure next visit.

## 2014-10-28 NOTE — Assessment & Plan Note (Signed)
Influenza vaccine administered at this visit.

## 2014-10-30 NOTE — Progress Notes (Signed)
Internal Medicine Clinic Attending  I saw and evaluated the patient.  I personally confirmed the key portions of the history and exam documented by Dr. Rathore and I reviewed pertinent patient test results.  The assessment, diagnosis, and plan were formulated together and I agree with the documentation in the resident's note.  

## 2014-11-03 ENCOUNTER — Encounter: Payer: Self-pay | Admitting: Family

## 2014-11-06 ENCOUNTER — Other Ambulatory Visit: Payer: Self-pay | Admitting: Dietician

## 2014-11-06 ENCOUNTER — Other Ambulatory Visit: Payer: Self-pay | Admitting: Internal Medicine

## 2014-11-06 DIAGNOSIS — I10 Essential (primary) hypertension: Secondary | ICD-10-CM

## 2014-11-06 DIAGNOSIS — E118 Type 2 diabetes mellitus with unspecified complications: Secondary | ICD-10-CM

## 2014-11-06 DIAGNOSIS — Z794 Long term (current) use of insulin: Principal | ICD-10-CM

## 2014-11-06 DIAGNOSIS — N183 Chronic kidney disease, stage 3 unspecified: Secondary | ICD-10-CM

## 2014-11-06 DIAGNOSIS — E1122 Type 2 diabetes mellitus with diabetic chronic kidney disease: Secondary | ICD-10-CM

## 2014-11-06 MED ORDER — LANCETS ULTRA FINE MISC: Status: DC

## 2014-11-06 MED ORDER — VALSARTAN 320 MG PO TABS: 320.0000 mg | ORAL_TABLET | Freq: Every day | ORAL | Status: DC

## 2014-11-06 MED ORDER — FREESTYLE LITE DEVI: Status: DC

## 2014-11-06 MED ORDER — GLUCOSE BLOOD VI STRP: ORAL_STRIP | Status: DC

## 2014-11-06 NOTE — Telephone Encounter (Signed)
Pt requesting Valsartan to be filled @ [facility] on [address].

## 2014-11-06 NOTE — Telephone Encounter (Signed)
Needs new meter- Freestyle or Precison meter and a 90 day supply sent to [facility] on  [address].

## 2014-11-07 ENCOUNTER — Other Ambulatory Visit: Payer: Self-pay | Admitting: *Deleted

## 2014-11-07 DIAGNOSIS — Z48812 Encounter for surgical aftercare following surgery on the circulatory system: Secondary | ICD-10-CM

## 2014-11-07 DIAGNOSIS — I739 Peripheral vascular disease, unspecified: Secondary | ICD-10-CM

## 2014-11-08 ENCOUNTER — Ambulatory Visit: Payer: PPO | Admitting: Family

## 2014-11-08 ENCOUNTER — Encounter (HOSPITAL_COMMUNITY): Payer: Commercial Managed Care - HMO

## 2014-11-27 ENCOUNTER — Ambulatory Visit: Payer: PPO | Admitting: Internal Medicine

## 2014-12-04 ENCOUNTER — Encounter: Payer: Self-pay | Admitting: Internal Medicine

## 2014-12-04 ENCOUNTER — Ambulatory Visit (INDEPENDENT_AMBULATORY_CARE_PROVIDER_SITE_OTHER): Payer: PPO | Admitting: Internal Medicine

## 2014-12-04 VITALS — BP 162/67 | HR 53 | Temp 97.9°F | Wt 141.7 lb

## 2014-12-04 DIAGNOSIS — M25511 Pain in right shoulder: Secondary | ICD-10-CM | POA: Diagnosis not present

## 2014-12-04 DIAGNOSIS — N183 Chronic kidney disease, stage 3 unspecified: Secondary | ICD-10-CM

## 2014-12-04 DIAGNOSIS — I10 Essential (primary) hypertension: Secondary | ICD-10-CM

## 2014-12-04 DIAGNOSIS — Z794 Long term (current) use of insulin: Secondary | ICD-10-CM | POA: Diagnosis not present

## 2014-12-04 DIAGNOSIS — M25512 Pain in left shoulder: Secondary | ICD-10-CM

## 2014-12-04 DIAGNOSIS — E1122 Type 2 diabetes mellitus with diabetic chronic kidney disease: Secondary | ICD-10-CM

## 2014-12-04 DIAGNOSIS — I5022 Chronic systolic (congestive) heart failure: Secondary | ICD-10-CM

## 2014-12-04 LAB — GLUCOSE, CAPILLARY: Glucose-Capillary: 220 mg/dL — ABNORMAL HIGH (ref 65–99)

## 2014-12-04 LAB — POCT GLYCOSYLATED HEMOGLOBIN (HGB A1C): Hemoglobin A1C: 7.4

## 2014-12-04 MED ORDER — DICLOFENAC SODIUM 1 % TD GEL: 2.0000 g | Freq: Four times a day (QID) | TRANSDERMAL | Status: DC

## 2014-12-04 MED ORDER — AMLODIPINE BESYLATE 5 MG PO TABS: 5.0000 mg | ORAL_TABLET | Freq: Every day | ORAL | Status: DC

## 2014-12-04 MED ORDER — METFORMIN HCL 500 MG PO TABS: 500.0000 mg | ORAL_TABLET | Freq: Two times a day (BID) | ORAL | Status: DC

## 2014-12-04 NOTE — Assessment & Plan Note (Addendum)
BP Readings from Last 3 Encounters:  12/04/14 162/67  08/31/14 123/48  08/02/14 136/66    Lab Results  Component Value Date   NA 141 07/04/2014   K 4.3 07/04/2014   CREATININE 1.19* 07/04/2014    Assessment: Blood pressure control:  Uncontrolled, repeat BP also high Progress toward BP goal:   Previously well controlled, now deteriorated Comments: Currently compliant with valsartan 320 mg daily, atenolol 12.5 mg daily, and lasix 40 mg daily. She follows with [facility].  Plan: Medications:  continue current medications, add amlodipine 5 mg daily. Other plans: Return in one month for recheck.

## 2014-12-04 NOTE — Assessment & Plan Note (Signed)
Given history of CKD and recent start to Metformin, would recheck BMET at follow up visit.   Plan: -Recheck BMET at follow up visit

## 2014-12-04 NOTE — Assessment & Plan Note (Signed)
Patient admits to DOE when she climbs stairs or walks up a steep hill. She has not had an echocardiogram completed in several years with her last one in 2010 revealed a low EF of 30-35%. She does not follow with Cardiology. She has been compliant with Lasix, atenolol, ASA, and valsartan. She denies lower extremity edema or chest pain.   Plan: -Discuss repeating an echo at the next visit

## 2014-12-04 NOTE — Assessment & Plan Note (Addendum)
Lab Results  Component Value Date   HGBA1C 7.4 12/04/2014   HGBA1C 7.3 08/31/2014   HGBA1C 6.8 04/20/2014     Assessment: Diabetes control:  Controlled Progress toward A1C goal:   At goal Comments: Currently no Lantus 45 units daily, Novolin 14 units TID WC, and metformin 500 mg BID. One episode of hypoglycemia in the 80s for which she took orange juice. If CBG is in the 70-80s, prior to meal, patient will skip her Novolog.   Plan: Medications:  continue current medications Home glucose monitoring: Frequency:  QID Timing:  ACHS Instruction/counseling given: reminded to bring blood glucose meter & log to each visit and discussed diet

## 2014-12-04 NOTE — Patient Instructions (Signed)
CONTINUE TAKING ALL MEDICATION AS YOU WERE.  FOR YOUR BLOOD PRESSURE: TAKE AMLODIPINE 5 MG ONCE A DAY. RETURN IN ONE MONTH FOR BLOOD PRESSURE RECHECK.  FOR YOUR SHOULDER PAIN: TRY VOLTAREN GEL FOR PAIN. IF IT IS TOO EXPENSIVE, YOU DO NOT HAVE TO PICK IT UP.

## 2014-12-04 NOTE — Progress Notes (Signed)
Subjective:    Patient ID: [name], female    DOB: 03-18-1940, 74 y.o.   MRN: [id number]  HPI [name] is a 74 y.o. female with PMHx of T2DM and HTN who presents to the clinic for follow up for HTN and T2DM. Please see A&P for the status of the patient's chronic medical problems.   Past Medical History  Diagnosis Date  . DM (diabetes mellitus) ([place])     insulin dependent  . Lumbar spinal stenosis   . History of colonic polyps   . Diverticulosis   . Nephrolithiasis     hx of  . HTN (hypertension)   . HLD (hyperlipidemia)   . PVD (peripheral vascular disease) ([facility])     ABI indicated moderated reduction arterial flow on the left.  Marland Kitchen CAD (coronary artery disease) 2006    s/p Quadrupal CABG 2006  . MI (myocardial infarction) ([place]) 2003    . [employer] [place]  . Tricuspid regurgitation 2009    moderate to severe, EF 55%  . Psoriasis   . OA (osteoarthritis)   . CKD (chronic kidney disease) stage 4, GFR 15-29 ml/min ([facility])     fluctuating between stage 3 and 4 depending on GFR  . Osteopenia 2010    T score of -1.8  . CHF (congestive heart failure) ([place]) 2010    EF 30-35% from Echo 06/2008  . MYOCARDIAL INFARCTION, HX OF 09/09/2006    Qualifier: Diagnosis of  By: Marinda Elk MD, Sonia Side    . DIVERTICULOSIS OF COLON 04/01/2007    Qualifier: Diagnosis of  By: Nelson-Smith CMA (AAMA), [name]    . NEPHROLITHIASIS 04/01/2007    Qualifier: Diagnosis of  By: Harlon Ditty CMA (AAMA), [name]    . Mouth ulcer adjacent to jaw bone 08/22/2010    S/p removal by ENT 09/2010     Outpatient Encounter Prescriptions as of 12/04/2014  Medication Sig  . ACCU-CHEK FASTCLIX LANCETS MISC Check blood sugar 3 times a day  . aspirin EC 81 MG tablet Take 81 mg by mouth daily.  Marland Kitchen atenolol (TENORMIN) 25 MG tablet Take 0.5 tablets (12.5 mg total) by mouth daily.  . betamethasone dipropionate 0.05 % lotion Apply topically 2 (two) times daily.  . Blood Glucose Monitoring Suppl (FREESTYLE LITE) DEVI Check  blood sugar up to 4 times a day  . Calcium Carb-Cholecalciferol (CALCIUM 600 + D PO) Take 1 tablet by mouth 2 (two) times daily.  . Clobetasol Prop Crea-Coal Tar 0.05 & 2.3 % KIT To apply twice daily on the affected area.  . clopidogrel (PLAVIX) 75 MG tablet Take 1 tablet (75 mg total) by mouth daily with breakfast.  . clopidogrel (PLAVIX) 75 MG tablet Take 1 tablet (75 mg total) by mouth daily.  Marland Kitchen esomeprazole (NEXIUM) 20 MG capsule Take 1 capsule (20 mg total) by mouth daily before breakfast.  . furosemide (LASIX) 40 MG tablet Take 1 tablet (40 mg total) by mouth daily.  Marland Kitchen glucose blood (FREESTYLE LITE) test strip Check blood sugar 4 times a day  . Insulin Glargine (LANTUS SOLOSTAR) 100 UNIT/ML Solostar Pen Inject 44 Units into the skin daily at 10 pm.  . Insulin Pen Needle 32G X 4 MM MISC Use with insulin pen once daily. diag code E11.8 insulin dependent  . insulin regular (NOVOLIN R) 100 units/mL injection Inject 0.14 mLs (14 Units total) into the skin 3 (three) times daily before meals.  . Insulin Syringe-Needle U-100 31G X 15/64" 0.3 ML MISC Use to inject  mealtime insulin 3 times a day Dx code 250.00 insulin requiring  . LANCETS ULTRA FINE MISC Check blood sugar 4 times a day  . metFORMIN (GLUCOPHAGE) 500 MG tablet Take 1 tablet (500 mg total) by mouth 2 (two) times daily with a meal.  . ONETOUCH DELICA LANCETS 86V MISC CHECK BLOOD SUGAR THREE TIMES DAILY  . potassium chloride (K-DUR) 10 MEQ tablet TAKE 1 TABLET TWICE DAILY  . potassium chloride (K-DUR,KLOR-CON) 10 MEQ tablet Take 1 tablet (10 mEq total) by mouth 2 (two) times daily.  . rosuvastatin (CRESTOR) 10 MG tablet Take 1 tablet (10 mg total) by mouth daily.  . traMADol (ULTRAM) 50 MG tablet TAKE 1 TABLET EVERY 12 HOURS AS NEEDED  . valsartan (DIOVAN) 320 MG tablet Take 1 tablet (320 mg total) by mouth daily.   No facility-administered encounter medications on file as of 12/04/2014.    Family History  Problem Relation Age of  Onset  . Cirrhosis Brother   . Heart disease Brother   . Heart attack Brother   . Hypertension Mother   . Hyperlipidemia Mother   . Hyperlipidemia Son   . Hypertension Son     Social History   Social History  . Marital Status: Widowed    Spouse Name: N/A  . Number of Children: N/A  . Years of Education: N/A   Occupational History  . Not on file.   Social History Main Topics  . Smoking status: Never Smoker   . Smokeless tobacco: Not on file  . Alcohol Use: No  . Drug Use: No  . Sexual Activity: Not on file   Other Topics Concern  . Not on file   Social History Narrative   Retired Event organiser   Widow/Widower   lives in [place]   4 kids (2 sons, 2 daughters) one son died many years ago in car accident.  other kids are all in [place].   Has friend that helps take care of her    Review of Systems General: Denies change in appetite and diaphoresis.  Respiratory: Admits to DOE. Denies SOB, cough, chest tightness.   Cardiovascular: Denies chest pain and palpitations.  Gastrointestinal: Denies nausea, vomiting, abdominal pain, diarrhea Endocrine: Denies polyuria, and polydipsia. Musculoskeletal: Admits to bilateral shoulder pain. Denies myalgias Neurological: Denies dizziness, headaches, weakness, lightheadedness, numbness     Objective:   Physical Exam Filed Vitals:   12/04/14 0821  BP: 161/62  Pulse: 52  Temp: 97.9 F (36.6 C)  TempSrc: Oral  Weight: 141 lb 11.2 oz (64.275 kg)  SpO2: 97%   General: Vital signs reviewed.  Patient is well-developed and well-nourished, in no acute distress and cooperative with exam.   Cardiovascular: Bradycardic, regular rhythm, S1 normal, S2 normal. Pulmonary/Chest: Clear to auscultation bilaterally, no wheezes, rales, or rhonchi. Abdominal: Soft, non-tender, non-distended, BS +.  Musculoskeletal: Decreased ROM in bilateral shoulders, increased pain with movement. No joint deformities, erythema Extremities:  No lower extremity edema bilaterally Psychiatric: Normal mood and affect. speech and behavior is normal. Cognition and memory are normal.      Assessment & Plan:   Please see problem based assessment and plan.

## 2014-12-04 NOTE — Assessment & Plan Note (Signed)
Patient complains of bilateral shoulder pain. She has a history of chronic right shoulder pain after a traumatic injury, but in the last one year, patient has had bilateral shoulder pain. Pain is worse with movement and if she lays on her shoulders. She avoids NSAIDs given her CKD, but occasional takes a tramadol if pain is uncontrolled. Pain is a 3/10, but worse with movement. She denies morning stiffness and myalgias. I doubt PMR, however, we will rule this out by checking an ESR and CRP today as she does not have a prior level.   Plan: -Check ESR, CRP -Voltaren gel -Continue tramadol or tylenol for pain

## 2014-12-05 LAB — C-REACTIVE PROTEIN: CRP: 1 mg/L (ref 0.0–4.9)

## 2014-12-05 LAB — SEDIMENTATION RATE: Sed Rate: 2 mm/hr (ref 0–40)

## 2014-12-05 NOTE — Progress Notes (Signed)
Internal Medicine Clinic Attending  Case discussed with Dr. Richardson soon after the resident saw the patient.  We reviewed the resident's history and exam and pertinent patient test results.  I agree with the assessment, diagnosis, and plan of care documented in the resident's note. 

## 2014-12-15 ENCOUNTER — Other Ambulatory Visit: Payer: Self-pay

## 2014-12-15 DIAGNOSIS — Z1231 Encounter for screening mammogram for malignant neoplasm of breast: Secondary | ICD-10-CM

## 2014-12-22 ENCOUNTER — Other Ambulatory Visit: Payer: Self-pay | Admitting: Internal Medicine

## 2014-12-22 DIAGNOSIS — K219 Gastro-esophageal reflux disease without esophagitis: Secondary | ICD-10-CM

## 2014-12-22 NOTE — Telephone Encounter (Signed)
Pt requesting esomeprazole to be filled @ [facility].

## 2014-12-23 MED ORDER — ESOMEPRAZOLE MAGNESIUM 20 MG PO CPDR: 20.0000 mg | DELAYED_RELEASE_CAPSULE | Freq: Every day | ORAL | Status: DC

## 2014-12-29 ENCOUNTER — Encounter: Payer: Self-pay | Admitting: Internal Medicine

## 2014-12-29 ENCOUNTER — Ambulatory Visit (INDEPENDENT_AMBULATORY_CARE_PROVIDER_SITE_OTHER): Payer: PPO | Admitting: Internal Medicine

## 2014-12-29 VITALS — BP 120/64 | HR 58 | Temp 98.0°F | Ht 60.0 in | Wt 141.6 lb

## 2014-12-29 DIAGNOSIS — Z79899 Other long term (current) drug therapy: Secondary | ICD-10-CM | POA: Diagnosis not present

## 2014-12-29 DIAGNOSIS — Z7982 Long term (current) use of aspirin: Secondary | ICD-10-CM

## 2014-12-29 DIAGNOSIS — I5022 Chronic systolic (congestive) heart failure: Secondary | ICD-10-CM | POA: Diagnosis not present

## 2014-12-29 DIAGNOSIS — I110 Hypertensive heart disease with heart failure: Secondary | ICD-10-CM | POA: Diagnosis not present

## 2014-12-29 DIAGNOSIS — I10 Essential (primary) hypertension: Secondary | ICD-10-CM

## 2014-12-29 NOTE — Progress Notes (Signed)
Subjective:   Patient ID: [name] female   DOB: [birth year] 74 y.o.   MRN: [id number]  HPI: CarolynChrisy [name] is a 74 y.o. woman with PMHx as described below seen today for follow up of her hypertension. She was being managed with stricter targets for blood pressure control due to her history of severe systolic congestive heart failure coronary artery disease status post quadruple vessel CABG in 2006. She was started on 5 mg amlodipine 4 weeks ago due to worsening of her blood pressure control. Since that time she is not having any worsening symptoms of dizziness, shortness of breath on exertion, or leg swelling. When she checks blood pressure at home it is typically 774F to 423T systolic blood pressure.  See problem based assessment and plan below for additional details.  Past Medical History  Diagnosis Date  . DM (diabetes mellitus) ([place])     insulin dependent  . Lumbar spinal stenosis   . History of colonic polyps   . Diverticulosis   . Nephrolithiasis     hx of  . HTN (hypertension)   . HLD (hyperlipidemia)   . PVD (peripheral vascular disease) ([facility])     ABI indicated moderated reduction arterial flow on the left.  [name] CAD (coronary artery disease) 2006    s/p Quadrupal CABG 2006  . MI (myocardial infarction) ([name]) 2003    . [name]  . Tricuspid regurgitation 2009    moderate to severe, EF 55%  . Psoriasis   . OA (osteoarthritis)   . CKD (chronic kidney disease) stage 4, GFR 15-29 ml/min (HCC)     fluctuating between stage 3 and 4 depending on GFR  . Osteopenia 2010    T score of -1.8  . CHF (congestive heart failure) ([name]) 2010    EF 30-35% from Echo 06/2008  . MYOCARDIAL INFARCTION, HX OF 09/09/2006    Qualifier: Diagnosis of  By: Marinda Elk MD, Sonia Side    . DIVERTICULOSIS OF COLON 04/01/2007    Qualifier: Diagnosis of  By: Nelson-Smith CMA (AAMA), [name]    . NEPHROLITHIASIS 04/01/2007    Qualifier: Diagnosis of  By: Harlon Ditty CMA (AAMA), [name]    . Mouth  ulcer adjacent to jaw bone 08/22/2010    S/p removal by ENT 09/2010    Current Outpatient Prescriptions  Medication Sig Dispense Refill  . ACCU-CHEK FASTCLIX LANCETS MISC Check blood sugar 3 times a day 102 each 12  . amLODipine (NORVASC) 5 MG tablet Take 1 tablet (5 mg total) by mouth daily. 30 tablet 1  . aspirin EC 81 MG tablet Take 81 mg by mouth daily.    [name] atenolol (TENORMIN) 25 MG tablet Take 0.5 tablets (12.5 mg total) by mouth daily. 90 tablet 2  . betamethasone dipropionate 0.05 % lotion Apply topically 2 (two) times daily. 60 mL 0  . Blood Glucose Monitoring Suppl (FREESTYLE LITE) DEVI Check blood sugar up to 4 times a day 1 each 0  . Calcium Carb-Cholecalciferol (CALCIUM 600 + D PO) Take 1 tablet by mouth 2 (two) times daily.    . Clobetasol Prop Crea-Coal Tar 0.05 & 2.3 % KIT To apply twice daily on the affected area. 1 kit 0  . clopidogrel (PLAVIX) 75 MG tablet Take 1 tablet (75 mg total) by mouth daily. 90 tablet 6  . diclofenac sodium (VOLTAREN) 1 % GEL Apply 2 g topically 4 (four) times daily. 100 g 1  . esomeprazole (NEXIUM) 20 MG capsule Take 1 capsule (20 mg  total) by mouth daily before breakfast. 90 capsule 3  . furosemide (LASIX) 40 MG tablet Take 1 tablet (40 mg total) by mouth daily. 90 tablet 0  . glucose blood (FREESTYLE LITE) test strip Check blood sugar 4 times a day 375 each 3  . Insulin Glargine (LANTUS SOLOSTAR) 100 UNIT/ML Solostar Pen Inject 44 Units into the skin daily at 10 pm. 45 mL 11  . Insulin Pen Needle 32G X 4 MM MISC Use with insulin pen once daily. diag code E11.8 insulin dependent 100 each 3  . insulin regular (NOVOLIN R) 100 units/mL injection Inject 0.14 mLs (14 Units total) into the skin 3 (three) times daily before meals. 20 mL 2  . Insulin Syringe-Needle U-100 31G X 15/64" 0.3 ML MISC Use to inject mealtime insulin 3 times a day Dx code 250.00 insulin requiring 300 each 3  . LANCETS ULTRA FINE MISC Check blood sugar 4 times a day 375 each 3  .  metFORMIN (GLUCOPHAGE) 500 MG tablet Take 1 tablet (500 mg total) by mouth 2 (two) times daily with a meal. 180 tablet 3  . ONETOUCH DELICA LANCETS 62X MISC CHECK BLOOD SUGAR THREE TIMES DAILY 300 each 12  . potassium chloride (K-DUR) 10 MEQ tablet TAKE 1 TABLET TWICE DAILY 180 tablet 0  . potassium chloride (K-DUR,KLOR-CON) 10 MEQ tablet Take 1 tablet (10 mEq total) by mouth 2 (two) times daily. 180 tablet 0  . rosuvastatin (CRESTOR) 10 MG tablet Take 1 tablet (10 mg total) by mouth daily. 90 tablet 0  . traMADol (ULTRAM) 50 MG tablet TAKE 1 TABLET EVERY 12 HOURS AS NEEDED 60 tablet 0  . valsartan (DIOVAN) 320 MG tablet Take 1 tablet (320 mg total) by mouth daily. 90 tablet 3   No current facility-administered medications for this visit.   Family History  Problem Relation Age of Onset  . Cirrhosis Brother   . Heart disease Brother   . Heart attack Brother   . Hypertension Mother   . Hyperlipidemia Mother   . Hyperlipidemia Son   . Hypertension Son    Social History   Social History  . Marital Status: Widowed    Spouse Name: N/A  . Number of Children: N/A  . Years of Education: N/A   Social History Main Topics  . Smoking status: Never Smoker   . Smokeless tobacco: Not on file  . Alcohol Use: No  . Drug Use: No  . Sexual Activity: Not on file   Other Topics Concern  . Not on file   Social History Narrative   Retired Event organiser   Widow/Widower   lives in [place]   4 kids (2 sons, 2 daughters) one son died many years ago in car accident.  other kids are all in [name].   Has friend that helps take care of her   Review of Systems: Review of Systems  Constitutional: Negative for fever and chills.  Respiratory: Negative for shortness of breath.   Cardiovascular: Positive for claudication and leg swelling. Negative for chest pain and palpitations.  Musculoskeletal: Negative for falls.  Neurological: Negative for dizziness, weakness and headaches.      Objective:  Physical Exam: There were no vitals filed for this visit. GENERAL- alert, co-operative, NAD HEENT- Atraumatic, oral mucosa appears moist, no carotid bruit, no cervical LN enlargement. CARDIAC- RRR, no murmurs, rubs or gallops. RESP- CTAB, no wheezes or crackles. ABDOMEN- Soft, nontender, no guarding or rebound EXTREMITIES- symmetric, 2+ pitting edema bilaterally. SKIN-  Warm, dry, No rash or lesion. PSYCH- Normal mood and affect, appropriate thought content and speech.  Assessment & Plan:

## 2014-12-29 NOTE — Assessment & Plan Note (Signed)
BP Readings from Last 3 Encounters:  12/29/14 120/64  12/04/14 162/67  08/31/14 123/48    Lab Results  Component Value Date   NA 141 07/04/2014   K 4.3 07/04/2014   CREATININE 1.19* 07/04/2014    Assessment: Blood pressure control: Controlled Progress toward BP goal:  At goal Comments: Currently on valsartan 320 mg daily, atenolol 12.5 mg daily, Lasix 40 mg daily, and amlodipine 5 mg daily.  Plan: Medications:  continue current medications  Other plans: Her blood pressure is now at goal. Today we will also check Bmet since it's been 6 months. I expect she is tolerating her diuretic and ARB without problem. Since there are no changes today and she is currently at control, recommended a follow-up in approximately 2 months for routine visit with her PCP.

## 2014-12-29 NOTE — Patient Instructions (Signed)
Today our plan is to continue the same medications and many were refilled for at least the next 6 months. If you have any difficulty at all getting these please call us immediately so we can correct this with your pharmacy.  We will check your blood work today and will call you if there are any unexpected results or need to change plans.  We have referred you for a new echocardiogram (ultrasound of the heart) and you will be contacted about scheduling this appointment.

## 2014-12-29 NOTE — Assessment & Plan Note (Signed)
Patient is denying frank dyspnea with exertion but does say she has easy fatigability. On review her last echocardiogram is from June 2010 with a low EF of 30-35%. However Myoview from 2012 showed improved ejection fraction of 77%. She is compliant on therapy with aspirin and beta blocker Lasix and ARB. She has 2+ pitting edema on physical exam. She doesn't report thinking her swelling is worse than it has been but this seems greater than previous reported and clinical documentation. She also required increased doses of antihypertensives. I discussed this with the patient and she was agreeable to rechecking her heart function since it has been 6 years and she has significant abnormalities on her previous echo.  Plan: Referred for echocardiogram

## 2014-12-29 NOTE — Progress Notes (Signed)
Internal Medicine Clinic Attending  I saw and evaluated the patient.  I personally confirmed the key portions of the history and exam documented by Dr. Rice and I reviewed pertinent patient test results.  The assessment, diagnosis, and plan were formulated together and I agree with the documentation in the resident's note.  

## 2014-12-30 LAB — BMP8+ANION GAP
Anion Gap: 16 mmol/L (ref 10.0–18.0)
BUN/Creatinine Ratio: 22 (ref 11–26)
BUN: 30 mg/dL — ABNORMAL HIGH (ref 8–27)
CO2: 29 mmol/L (ref 18–29)
CREATININE: 1.36 mg/dL — AB (ref 0.57–1.00)
Calcium: 9.9 mg/dL (ref 8.7–10.3)
Chloride: 97 mmol/L (ref 96–106)
GFR calc non Af Amer: 38 mL/min/{1.73_m2} — ABNORMAL LOW (ref >59)
GFR, EST AFRICAN AMERICAN: 44 mL/min/{1.73_m2} — AB (ref >59)
Glucose: 158 mg/dL — ABNORMAL HIGH (ref 65–99)
POTASSIUM: 4.3 mmol/L (ref 3.5–5.2)
Sodium: 142 mmol/L (ref 134–144)

## 2015-01-01 ENCOUNTER — Telehealth: Payer: Self-pay | Admitting: *Deleted

## 2015-01-01 NOTE — Telephone Encounter (Signed)
CALLED PATIENT WITH ECHO APPOINTMENT FOR 12-23-016/ ARRIVE 2:00PM CHECK IN AT [facility].  IF NEED TO CHANGE APPOINTMENT TO CALL IS:[phone].

## 2015-01-03 ENCOUNTER — Ambulatory Visit
Admission: RE | Admit: 2015-01-03 | Discharge: 2015-01-03 | Disposition: A | Discharge status: Home / Self Care | Payer: PPO | Source: Ambulatory Visit

## 2015-01-03 ENCOUNTER — Ambulatory Visit (HOSPITAL_COMMUNITY)
Admission: RE | Admit: 2015-01-03 | Discharge: 2015-01-03 | Disposition: A | Discharge status: Home / Self Care | Payer: PPO | Source: Ambulatory Visit | Attending: Internal Medicine | Admitting: Internal Medicine

## 2015-01-03 DIAGNOSIS — E119 Type 2 diabetes mellitus without complications: Secondary | ICD-10-CM | POA: Diagnosis not present

## 2015-01-03 DIAGNOSIS — I059 Rheumatic mitral valve disease, unspecified: Secondary | ICD-10-CM | POA: Diagnosis not present

## 2015-01-03 DIAGNOSIS — I253 Aneurysm of heart: Secondary | ICD-10-CM | POA: Diagnosis not present

## 2015-01-03 DIAGNOSIS — I5022 Chronic systolic (congestive) heart failure: Secondary | ICD-10-CM | POA: Insufficient documentation

## 2015-01-03 DIAGNOSIS — I5189 Other ill-defined heart diseases: Secondary | ICD-10-CM | POA: Insufficient documentation

## 2015-01-03 DIAGNOSIS — I509 Heart failure, unspecified: Secondary | ICD-10-CM | POA: Diagnosis present

## 2015-01-03 DIAGNOSIS — I071 Rheumatic tricuspid insufficiency: Secondary | ICD-10-CM | POA: Diagnosis not present

## 2015-01-03 DIAGNOSIS — I10 Essential (primary) hypertension: Secondary | ICD-10-CM | POA: Diagnosis not present

## 2015-01-03 DIAGNOSIS — Z1231 Encounter for screening mammogram for malignant neoplasm of breast: Secondary | ICD-10-CM

## 2015-01-03 DIAGNOSIS — I517 Cardiomegaly: Secondary | ICD-10-CM | POA: Diagnosis not present

## 2015-01-03 NOTE — Progress Notes (Signed)
Echocardiogram 2D Echocardiogram has been performed.  [name] 01/03/2015, 4:37 PM

## 2015-01-04 ENCOUNTER — Telehealth: Payer: Self-pay | Admitting: Internal Medicine

## 2015-01-04 ENCOUNTER — Other Ambulatory Visit: Payer: Self-pay | Admitting: *Deleted

## 2015-01-04 DIAGNOSIS — I10 Essential (primary) hypertension: Secondary | ICD-10-CM

## 2015-01-04 MED ORDER — "INSULIN SYRINGE-NEEDLE U-100 31G X 15/64"" 0.3 ML MISC": Status: DC

## 2015-01-04 MED ORDER — FUROSEMIDE 40 MG PO TABS: 40.0000 mg | ORAL_TABLET | Freq: Every day | ORAL | Status: DC

## 2015-01-04 NOTE — Telephone Encounter (Signed)
Pt requesting meds to be filled @ [facility] on [address]

## 2015-01-04 NOTE — Telephone Encounter (Signed)
Request sent to Dr Marvel Plan.

## 2015-01-05 ENCOUNTER — Ambulatory Visit (HOSPITAL_COMMUNITY): Payer: PPO

## 2015-01-11 ENCOUNTER — Other Ambulatory Visit: Payer: Self-pay | Admitting: Internal Medicine

## 2015-01-11 ENCOUNTER — Encounter: Payer: Self-pay | Admitting: Family

## 2015-01-11 NOTE — Telephone Encounter (Signed)
Pt states insurance will not covered Nexium, requesting different med. Please call pt back.

## 2015-01-11 NOTE — Telephone Encounter (Signed)
Review of the medication history reveals the earliest entry for Nexium is 04/18/2010.  The note at that time fails to document an indication for the PPI therapy.  Review of the history and problem lists also fail to clarify the reason for the PPI therapy.  When pre-EPIC notes are reviewed (into 2011) the Nexium is listed.  I will not order an alternative PPI at this time as the indication remains unclear and she may no longer require one.  I will defer discussion with the patient for indications for continued PPI therapy to her new PCP Dr. Marvel Plan.  If she discovers a reason to continue chronic PPI therapy a generic such as omeprazole may be indicated, although for certain indications H2 blocker therapy may be equally efficacious from a clinical standpoint.

## 2015-01-15 ENCOUNTER — Other Ambulatory Visit: Payer: Self-pay | Admitting: Internal Medicine

## 2015-01-16 NOTE — Telephone Encounter (Signed)
Pt called about med - appt made with Dr Marvel Plan to discuss med. Hilda Blades Sharada Albornoz RN 01/16/15 11:20AM

## 2015-01-17 ENCOUNTER — Ambulatory Visit (INDEPENDENT_AMBULATORY_CARE_PROVIDER_SITE_OTHER): Payer: PPO | Admitting: Internal Medicine

## 2015-01-17 ENCOUNTER — Encounter: Payer: Self-pay | Admitting: Internal Medicine

## 2015-01-17 VITALS — BP 125/54 | HR 57 | Temp 97.6°F | Ht 62.0 in | Wt 140.4 lb

## 2015-01-17 DIAGNOSIS — M8580 Other specified disorders of bone density and structure, unspecified site: Secondary | ICD-10-CM

## 2015-01-17 DIAGNOSIS — I10 Essential (primary) hypertension: Secondary | ICD-10-CM | POA: Diagnosis not present

## 2015-01-17 DIAGNOSIS — K219 Gastro-esophageal reflux disease without esophagitis: Secondary | ICD-10-CM | POA: Diagnosis not present

## 2015-01-17 DIAGNOSIS — Z0000 Encounter for general adult medical examination without abnormal findings: Secondary | ICD-10-CM

## 2015-01-17 DIAGNOSIS — I5022 Chronic systolic (congestive) heart failure: Secondary | ICD-10-CM

## 2015-01-17 MED ORDER — POTASSIUM CHLORIDE ER 10 MEQ PO TBCR: 10.0000 meq | EXTENDED_RELEASE_TABLET | Freq: Two times a day (BID) | ORAL | Status: DC

## 2015-01-17 MED ORDER — FAMOTIDINE 20 MG PO TABS: 20.0000 mg | ORAL_TABLET | Freq: Two times a day (BID) | ORAL | Status: DC

## 2015-01-17 MED ORDER — AMLODIPINE BESYLATE 5 MG PO TABS: 5.0000 mg | ORAL_TABLET | Freq: Every day | ORAL | Status: DC

## 2015-01-17 MED ORDER — ROSUVASTATIN CALCIUM 10 MG PO TABS: 10.0000 mg | ORAL_TABLET | Freq: Every day | ORAL | Status: DC

## 2015-01-17 MED ORDER — TRAMADOL HCL 50 MG PO TABS: 50.0000 mg | ORAL_TABLET | Freq: Every day | ORAL | Status: DC | PRN

## 2015-01-17 NOTE — Assessment & Plan Note (Signed)
Previous echo in 2010 revealed CHF with EF of 30-35%. Repeat Echo on 01/03/15 revealed EF of 55-60%, greatly improved. Also, mild LVH, grade 1 diastolic, and mild TR. Patient is on amlodipine 5 mg daily, ASA, atenolol 25 mg daily, lasix 40 mg daily, and valsartan 320 mg daily.   Plan: -Continue above regimen

## 2015-01-17 NOTE — Patient Instructions (Signed)
FOR YOUR INDIGESTION: -TAKE FAMOTIDINE 1 PILL TWICE A DAY AS NEEDED FOR INDIGESTION  FOR YOUR OSTEOPENIA: -WE WILL RECHECK A BONE SCAN -TAKE VITAMIN D 800 UNITS A DAY -TAKE CALCIUM 1200 UNITS A DAY  Osteoporosis Osteoporosis is the thinning and loss of density in the bones. Osteoporosis makes the bones more brittle, fragile, and likely to break (fracture). Over time, osteoporosis can cause the bones to become so weak that they fracture after a simple fall. The bones most likely to fracture are the bones in the hip, wrist, and spine. CAUSES  The exact cause is not known. RISK FACTORS Anyone can develop osteoporosis. You may be at greater risk if you have a family history of the condition or have poor nutrition. You may also have a higher risk if you are:   Female.   39 years old or older.  A smoker.  Not physically active.   White or Asian.  Slender. SIGNS AND SYMPTOMS  A fracture might be the first sign of the disease, especially if it results from a fall or injury that would not usually cause a bone to break. Other signs and symptoms include:   Low back and neck pain.  Stooped posture.  Height loss. DIAGNOSIS  To make a diagnosis, your health care provider may:  Take a medical history.  Perform a physical exam.  Order tests, such as:  A bone mineral density test.  A dual-energy X-ray absorptiometry test. TREATMENT  The goal of osteoporosis treatment is to strengthen your bones to reduce your risk of a fracture. Treatment may involve:  Making lifestyle changes, such as:  Eating a diet rich in calcium.  Doing weight-bearing and muscle-strengthening exercises.  Stopping tobacco use.  Limiting alcohol intake.  Taking medicine to slow the process of bone loss or to increase bone density.  Monitoring your levels of calcium and vitamin D. HOME CARE INSTRUCTIONS  Include calcium and vitamin D in your diet. Calcium is important for bone health, and vitamin D  helps the body absorb calcium.  Perform weight-bearing and muscle-strengthening exercises as directed by your health care provider.  Do not use any tobacco products, including cigarettes, chewing tobacco, and electronic cigarettes. If you need help quitting, ask your health care provider.  Limit your alcohol intake.  Take medicines only as directed by your health care provider.  Keep all follow-up visits as directed by your health care provider. This is important.  Take precautions at home to lower your risk of falling, such as:  Keeping rooms well lit and clutter free.  Installing safety rails on stairs.  Using rubber mats in the bathroom and other areas that are often wet or slippery. SEEK IMMEDIATE MEDICAL CARE IF:  You fall or injure yourself.    This information is not intended to replace advice given to you by your health care provider. Make sure you discuss any questions you have with your health care provider.   Document Released: 10/09/2004 Document Revised: 01/20/2014 Document Reviewed: 06/09/2013 Elsevier Interactive Patient Education [employer].

## 2015-01-17 NOTE — Assessment & Plan Note (Addendum)
BP Readings from Last 3 Encounters:  01/17/15 125/54  12/29/14 120/64  12/04/14 162/67    Lab Results  Component Value Date   NA 142 12/29/2014   K 4.3 12/29/2014   CREATININE 1.36* 12/29/2014    Assessment: Blood pressure control:  Controlled Progress toward BP goal:   At goal Comments: Patient reports compliance with valsartan 320 mg daily, atenolol 25 mg daily, lasix 40 mg daily, and amlodipine 5 mg daily.   Plan: Medications:  Continue current medications

## 2015-01-17 NOTE — Assessment & Plan Note (Signed)
Ordered repeat DEXA scan

## 2015-01-17 NOTE — Assessment & Plan Note (Signed)
Patient was on esomeprazole previously, but has since run out since her insurance no longer covers it. When patient does not take her medication, she develops indigestion that she describes as a burning sensation running up her chest. She denies abdominal pain or history of PUD.  Plan: -Famotidine 20 mg BID -If famotidine doesn't work, switch to omeprazole 20 mg daily

## 2015-01-17 NOTE — Assessment & Plan Note (Addendum)
DEXA scan in 2010 revealed T score of -1.8 according to previous notes.   Plan: -Repeat DEXA -Continue Vitamin D/Calcium [phone] QD

## 2015-01-17 NOTE — Progress Notes (Signed)
Subjective:    Patient ID: [name], female    DOB: 1940/04/20, 75 y.o.   MRN: [id number]  HPI [name] is a 75 y.o. female with PMHx of GERD, HTN, chronic CHF, CKD who presents to the clinic for GERD. Please see A&P for the status of the patient's chronic medical problems.   Past Medical History  Diagnosis Date  . 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 Lumbar spinal stenosis   . History of colonic polyps   . Diverticulosis   . Nephrolithiasis     hx of  . HTN (hypertension)   . HLD (hyperlipidemia)   . PVD (peripheral vascular disease) ([facility])     ABI indicated moderated reduction arterial flow on the left.  Marland Kitchen CAD (coronary artery disease) 2006    s/p Quadrupal CABG 2006  . MI (myocardial infarction) ([place]) 2003    . [place]  . Tricuspid regurgitation 2009    moderate to severe, EF 55%  . Psoriasis   . OA (osteoarthritis)   . CKD (chronic kidney disease) stage 4, GFR 15-29 ml/min ([facility])     fluctuating between stage 3 and 4 depending on GFR  . Osteopenia 2010    T score of -1.8  . CHF (congestive heart failure) ([facility]) 2010    EF 30-35% from Echo 06/2008  . MYOCARDIAL INFARCTION, HX OF 09/09/2006    Qualifier: Diagnosis of  By: Marinda Elk MD, Sonia Side    . DIVERTICULOSIS OF COLON 04/01/2007    Qualifier: Diagnosis of  By: Nelson-Smith CMA (AAMA), [name]    . NEPHROLITHIASIS 04/01/2007    Qualifier: Diagnosis of  By: Harlon Ditty CMA (AAMA), [name]    . Mouth ulcer adjacent to jaw bone 08/22/2010    S/p removal by ENT 09/2010     Outpatient Encounter Prescriptions as of 01/17/2015  Medication Sig  . ACCU-CHEK FASTCLIX LANCETS MISC Check blood sugar 3 times a day  . amLODipine (NORVASC) 5 MG tablet Take 1 tablet (5 mg total) by mouth daily.  Marland Kitchen aspirin EC 81 MG tablet Take 81 mg by mouth daily.  Marland Kitchen atenolol (TENORMIN) 25 MG tablet Take 0.5 tablets (12.5 mg total) by mouth daily.  . betamethasone dipropionate 0.05 % lotion Apply topically 2 (two) times  daily.  . Blood Glucose Monitoring Suppl (FREESTYLE LITE) DEVI Check blood sugar up to 4 times a day  . Calcium Carb-Cholecalciferol (CALCIUM 600 + D PO) Take 1 tablet by mouth 2 (two) times daily.  . Clobetasol Prop Crea-Coal Tar 0.05 & 2.3 % KIT To apply twice daily on the affected area.  . clopidogrel (PLAVIX) 75 MG tablet Take 1 tablet (75 mg total) by mouth daily.  . diclofenac sodium (VOLTAREN) 1 % GEL Apply 2 g topically 4 (four) times daily.  . famotidine (PEPCID) 20 MG tablet Take 1 tablet (20 mg total) by mouth 2 (two) times daily.  . furosemide (LASIX) 40 MG tablet Take 1 tablet (40 mg total) by mouth daily.  Marland Kitchen glucose blood (FREESTYLE LITE) test strip Check blood sugar 4 times a day  . Insulin Glargine (LANTUS SOLOSTAR) 100 UNIT/ML Solostar Pen Inject 44 Units into the skin daily at 10 pm.  . Insulin Pen Needle 32G X 4 MM MISC Use with insulin pen once daily. diag code E11.8 insulin dependent  . insulin regular (NOVOLIN R) 100 units/mL injection Inject 0.14 mLs (14 Units total) into the skin 3 (three) times daily before meals.  Marland Kitchen  Insulin Syringe-Needle U-100 31G X 15/64" 0.3 ML MISC Use to inject mealtime insulin 3 times a day Dx code 250.00 insulin requiring  . LANCETS ULTRA FINE MISC Check blood sugar 4 times a day  . metFORMIN (GLUCOPHAGE) 500 MG tablet Take 1 tablet (500 mg total) by mouth 2 (two) times daily with a meal.  . ONETOUCH DELICA LANCETS 60Y MISC CHECK BLOOD SUGAR THREE TIMES DAILY  . potassium chloride (K-DUR) 10 MEQ tablet Take 1 tablet (10 mEq total) by mouth 2 (two) times daily.  . rosuvastatin (CRESTOR) 10 MG tablet Take 1 tablet (10 mg total) by mouth daily.  . traMADol (ULTRAM) 50 MG tablet Take 1 tablet (50 mg total) by mouth daily as needed for severe pain.  . valsartan (DIOVAN) 320 MG tablet Take 1 tablet (320 mg total) by mouth daily.  . [DISCONTINUED] amLODipine (NORVASC) 5 MG tablet Take 1 tablet (5 mg total) by mouth daily.  . [DISCONTINUED] esomeprazole  (NEXIUM) 20 MG capsule Take 1 capsule (20 mg total) by mouth daily before breakfast.  . [DISCONTINUED] famotidine (PEPCID) 20 MG tablet Take 1 tablet (20 mg total) by mouth 2 (two) times daily.  . [DISCONTINUED] potassium chloride (K-DUR) 10 MEQ tablet TAKE 1 TABLET(10 MEQ) BY MOUTH TWICE DAILY  . [DISCONTINUED] rosuvastatin (CRESTOR) 10 MG tablet TAKE 1 TABLET(10 MG) BY MOUTH DAILY  . [DISCONTINUED] traMADol (ULTRAM) 50 MG tablet TAKE 1 TABLET EVERY 12 HOURS AS NEEDED   No facility-administered encounter medications on file as of 01/17/2015.    Family History  Problem Relation Age of Onset  . Cirrhosis Brother   . Heart disease Brother   . Heart attack Brother   . Hypertension Mother   . Hyperlipidemia Mother   . Hyperlipidemia Son   . Hypertension Son     Social History   Social History  . Marital Status: Widowed    Spouse Name: N/A  . Number of Children: N/A  . Years of Education: N/A   Occupational History  . Not on file.   Social History Main Topics  . Smoking status: Never Smoker   . Smokeless tobacco: Not on file  . Alcohol Use: No  . Drug Use: No  . Sexual Activity: Not on file   Other Topics Concern  . Not on file   Social History Narrative   Retired Event organiser   Widow/Widower   lives in [place]   4 kids (2 sons, 2 daughters) one son died many years ago in car accident.  other kids are all in [place].   Has friend that helps take care of her   Review of Systems General: Denies fatigue, change in appetite, weight gain.  Respiratory: Denies SOB, cough, DOE.   Cardiovascular: Denies chest pain, orthopnea, leg swelling and palpitations.  Gastrointestinal: Admits to reflux. Denies nausea, vomiting, abdominal pain, diarrhea.  MSK: Admits to joint pain (chronic). Neurological: Denies dizziness, headaches, weakness, lightheadedness    Objective:   Physical Exam Filed Vitals:   01/17/15 1332  BP: 125/54  Pulse: 57  Temp: 97.6 F (36.4  C)  TempSrc: Oral  Height: 5' 2"  (1.575 m)  Weight: 140 lb 6.4 oz (63.685 kg)  SpO2: 96%   General: Vital signs reviewed.  Patient is well-developed and well-nourished, in no acute distress and cooperative with exam.  Neck: No JVD, no carotid bruit present.  Cardiovascular: RRR, S1 normal, S2 normal. Pulmonary/Chest: Clear to auscultation bilaterally, no wheezes, rales, or rhonchi. Abdominal: Soft, non-tender, non-distended,  BS +.  Extremities: No lower extremity edema bilaterally      Assessment & Plan:   Please see problem based assessment and plan.

## 2015-01-18 ENCOUNTER — Encounter: Payer: Self-pay | Admitting: Family

## 2015-01-18 ENCOUNTER — Ambulatory Visit (HOSPITAL_COMMUNITY)
Admission: RE | Admit: 2015-01-18 | Discharge: 2015-01-18 | Disposition: A | Discharge status: Home / Self Care | Payer: PPO | Source: Ambulatory Visit | Attending: Vascular Surgery | Admitting: Vascular Surgery

## 2015-01-18 ENCOUNTER — Other Ambulatory Visit: Payer: Self-pay | Admitting: Internal Medicine

## 2015-01-18 ENCOUNTER — Ambulatory Visit (INDEPENDENT_AMBULATORY_CARE_PROVIDER_SITE_OTHER): Payer: PPO | Admitting: Family

## 2015-01-18 VITALS — BP 140/64 | HR 56 | Temp 97.4°F | Resp 16 | Ht 60.5 in | Wt 140.3 lb

## 2015-01-18 DIAGNOSIS — I129 Hypertensive chronic kidney disease with stage 1 through stage 4 chronic kidney disease, or unspecified chronic kidney disease: Secondary | ICD-10-CM | POA: Diagnosis not present

## 2015-01-18 DIAGNOSIS — I779 Disorder of arteries and arterioles, unspecified: Secondary | ICD-10-CM

## 2015-01-18 DIAGNOSIS — E1151 Type 2 diabetes mellitus with diabetic peripheral angiopathy without gangrene: Secondary | ICD-10-CM

## 2015-01-18 DIAGNOSIS — R938 Abnormal findings on diagnostic imaging of other specified body structures: Secondary | ICD-10-CM | POA: Diagnosis not present

## 2015-01-18 DIAGNOSIS — Z95828 Presence of other vascular implants and grafts: Secondary | ICD-10-CM | POA: Diagnosis not present

## 2015-01-18 DIAGNOSIS — I739 Peripheral vascular disease, unspecified: Secondary | ICD-10-CM | POA: Diagnosis not present

## 2015-01-18 DIAGNOSIS — Z48812 Encounter for surgical aftercare following surgery on the circulatory system: Secondary | ICD-10-CM

## 2015-01-18 DIAGNOSIS — N184 Chronic kidney disease, stage 4 (severe): Secondary | ICD-10-CM | POA: Diagnosis not present

## 2015-01-18 DIAGNOSIS — E1122 Type 2 diabetes mellitus with diabetic chronic kidney disease: Secondary | ICD-10-CM | POA: Insufficient documentation

## 2015-01-18 NOTE — Progress Notes (Signed)
Internal Medicine Clinic Attending  Case discussed with Dr. Richardson soon after the resident saw the patient.  We reviewed the resident's history and exam and pertinent patient test results.  I agree with the assessment, diagnosis, and plan of care documented in the resident's note. 

## 2015-01-18 NOTE — Patient Instructions (Signed)
Peripheral Vascular Disease Peripheral vascular disease (PVD) is a disease of the blood vessels that are not part of your heart and brain. A simple term for PVD is poor circulation. In most cases, PVD narrows the blood vessels that carry blood from your heart to the rest of your body. This can result in a decreased supply of blood to your arms, legs, and internal organs, like your stomach or kidneys. However, it most often affects a person's lower legs and feet. There are two types of PVD.  Organic PVD. This is the more common type. It is caused by damage to the structure of blood vessels.  Functional PVD. This is caused by conditions that make blood vessels contract and tighten (spasm). Without treatment, PVD tends to get worse over time. PVD can also lead to acute ischemic limb. This is when an arm or limb suddenly has trouble getting enough blood. This is a medical emergency. CAUSES Each type of PVD has many different causes. The most common cause of PVD is buildup of a fatty material (plaque) inside of your arteries (atherosclerosis). Small amounts of plaque can break off from the walls of the blood vessels and become lodged in a smaller artery. This blocks blood flow and can cause acute ischemic limb. Other common causes of PVD include:  Blood clots that form inside of blood vessels.  Injuries to blood vessels.  Diseases that cause inflammation of blood vessels or cause blood vessel spasms.  Health behaviors and health history that increase your risk of developing PVD. RISK FACTORS  You may have a greater risk of PVD if you:  Have a family history of PVD.  Have certain medical conditions, including:  High cholesterol.  Diabetes.  High blood pressure (hypertension).  Coronary heart disease.  Past problems with blood clots.  Past injury, such as burns or a broken bone. These may have damaged blood vessels in your limbs.  Buerger disease. This is caused by inflamed blood  vessels in your hands and feet.  Some forms of arthritis.  Rare birth defects that affect the arteries in your legs.  Use tobacco.  Do not get enough exercise.  Are obese.  Are age 50 or older. SIGNS AND SYMPTOMS  PVD may cause many different symptoms. Your symptoms depend on what part of your body is not getting enough blood. Some common signs and symptoms include:  Cramps in your lower legs. This may be a symptom of poor leg circulation (claudication).  Pain and weakness in your legs while you are physically active that goes away when you rest (intermittent claudication).  Leg pain when at rest.  Leg numbness, tingling, or weakness.  Coldness in a leg or foot, especially when compared with the other leg.  Skin or hair changes. These can include:  Hair loss.  Shiny skin.  Pale or bluish skin.  Thick toenails.  Inability to get or maintain an erection (erectile dysfunction). People with PVD are more prone to developing ulcers and sores on their toes, feet, or legs. These may take longer than normal to heal. DIAGNOSIS Your health care provider may diagnose PVD from your signs and symptoms. The health care provider will also do a physical exam. You may have tests to find out what is causing your PVD and determine its severity. Tests may include:  Blood pressure recordings from your arms and legs and measurements of the strength of your pulses (pulse volume recordings).  Imaging studies using sound waves to take pictures of   the blood flow through your blood vessels (Doppler ultrasound).  Injecting a dye into your blood vessels before having imaging studies using:  X-rays (angiogram or arteriogram).  Computer-generated X-rays (CT angiogram).  A powerful electromagnetic field and a computer (magnetic resonance angiogram or MRA). TREATMENT Treatment for PVD depends on the cause of your condition and the severity of your symptoms. It also depends on your age. Underlying  causes need to be treated and controlled. These include long-lasting (chronic) conditions, such as diabetes, high cholesterol, and high blood pressure. You may need to first try making lifestyle changes and taking medicines. Surgery may be needed if these do not work. Lifestyle changes may include:  Quitting smoking.  Exercising regularly.  Following a low-fat, low-cholesterol diet. Medicines may include:  Blood thinners to prevent blood clots.  Medicines to improve blood flow.  Medicines to improve your blood cholesterol levels. Surgical procedures may include:  A procedure that uses an inflated balloon to open a blocked artery and improve blood flow (angioplasty).  A procedure to put in a tube (stent) to keep a blocked artery open (stent implant).  Surgery to reroute blood flow around a blocked artery (peripheral bypass surgery).  Surgery to remove dead tissue from an infected wound on the affected limb.  Amputation. This is surgical removal of the affected limb. This may be necessary in cases of acute ischemic limb that are not improved through medical or surgical treatments. HOME CARE INSTRUCTIONS  Take medicines only as directed by your health care provider.  Do not use any tobacco products, including cigarettes, chewing tobacco, or electronic cigarettes. If you need help quitting, ask your health care provider.  Lose weight if you are overweight, and maintain a healthy weight as directed by your health care provider.  Eat a diet that is low in fat and cholesterol. If you need help, ask your health care provider.  Exercise regularly. Ask your health care provider to suggest some good activities for you.  Use compression stockings or other mechanical devices as directed by your health care provider.  Take good care of your feet.  Wear comfortable shoes that fit well.  Check your feet often for any cuts or sores. SEEK MEDICAL CARE IF:  You have cramps in your legs  while walking.  You have leg pain when you are at rest.  You have coldness in a leg or foot.  Your skin changes.  You have erectile dysfunction.  You have cuts or sores on your feet that are not healing. SEEK IMMEDIATE MEDICAL CARE IF:  Your arm or leg turns cold and blue.  Your arms or legs become red, warm, swollen, painful, or numb.  You have chest pain or trouble breathing.  You suddenly have weakness in your face, arm, or leg.  You become very confused or lose the ability to speak.  You suddenly have a very bad headache or lose your vision.   This information is not intended to replace advice given to you by your health care provider. Make sure you discuss any questions you have with your health care provider.   Document Released: 02/07/2004 Document Revised: 01/20/2014 Document Reviewed: 06/09/2013 Elsevier Interactive Patient Education 2016 [employer]  

## 2015-01-18 NOTE — Progress Notes (Signed)
[facility] [place] HISTORY AND PHYSICAL -PAD    REASON FOR VISIT: follow up status post PTA and stent of the left common iliac artery for a chronic total occlusion.   History of Present Illness [name] is a 75 y.o. female patient of Dr. Scot Dock who had presented with progressive ischemia of the left lower extremity and significant rest pain. She had no left femoral pulse. Of note her ABI was 25% and on exam she had evidence of multilevel arterial occlusive disease.  She was taken to the peripheral vascular lab on 07/03/2014. She underwent PTA and 6 mm x 29 mm stent of the left common iliac artery for a chronic total occlusion. There was no residual stenosis.  Pt states her blood pressure was 409 systolic in her PCP's office yesterday.  Pt denies claudication sx's with walking, denies non healing wounds.  Pt admits to not walking much due to the cold weather, denies any other barriers to walking.  She has done well since the procedure and denies rest pain in her left foot now. He is not a smoker. She is on 81 mg of aspirin. She is on Plavix. She is on a statin.  Pt denies any hx of stroke or TIA.   The patient denies New Medical or Surgical History.  Pt Diabetic: Yes, A1C was 7.4 in November 2017 Pt smoker: non-smoker  Pt meds include: Statin :Yes Betablocker: Yes ASA: Yes Other anticoagulants/antiplatelets: Plavix  Past Medical History  Diagnosis Date  . 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 Lumbar spinal stenosis   . History of colonic polyps   . Diverticulosis   . Nephrolithiasis     hx of  . HTN (hypertension)   . HLD (hyperlipidemia)   . PVD (peripheral vascular disease) ([facility])     ABI indicated moderated reduction arterial flow on the left.  Marland Kitchen CAD (coronary artery disease) 2006    s/p Quadrupal CABG 2006  . MI (myocardial infarction) ([place]) 2003    . [place]  . Tricuspid regurgitation 2009    moderate to  severe, EF 55%  . Psoriasis   . OA (osteoarthritis)   . CKD (chronic kidney disease) stage 4, GFR 15-29 ml/min (HCC)     fluctuating between stage 3 and 4 depending on GFR  . Osteopenia 2010    T score of -1.8  . CHF (congestive heart failure) ([place]) 2010    EF 30-35% from Echo 06/2008  . MYOCARDIAL INFARCTION, HX OF 09/09/2006    Qualifier: Diagnosis of  By: Marinda Elk MD, Sonia Side    . DIVERTICULOSIS OF COLON 04/01/2007    Qualifier: Diagnosis of  By: Nelson-Smith CMA (AAMA), [name]    . NEPHROLITHIASIS 04/01/2007    Qualifier: Diagnosis of  By: Harlon Ditty CMA (AAMA), [name]    . Mouth ulcer adjacent to jaw bone 08/22/2010    S/p removal by ENT 09/2010     Social History Social History  Substance Use Topics  . Smoking status: Never Smoker   . Smokeless tobacco: None  . Alcohol Use: No    Family History Family History  Problem Relation Age of Onset  . Cirrhosis Brother   . Heart disease Brother   . Heart attack Brother   . Hypertension Mother   . Hyperlipidemia Mother   . Hyperlipidemia Son   . Hypertension Son     Past Surgical History  Procedure Laterality Date  . Laparoscopic supracervical hysterectomy    .  Coronary artery bypass graft  2006    4 vessel  . Coronary angioplasty with stent placement  2003  . Cataract extraction  2008    left eye  . Peripheral vascular catheterization N/A 07/03/2014    Procedure: Abdominal Aortogram w/Lower Extremity;  Surgeon: Angelia Mould, MD;  Location: [facility];  Service: Cardiovascular;  Laterality: N/A;    No Known Allergies  Current Outpatient Prescriptions  Medication Sig Dispense Refill  . ACCU-CHEK FASTCLIX LANCETS MISC Check blood sugar 3 times a day 102 each 12  . amLODipine (NORVASC) 5 MG tablet Take 1 tablet (5 mg total) by mouth daily. 90 tablet 3  . aspirin EC 81 MG tablet Take 81 mg by mouth daily.    Marland Kitchen atenolol (TENORMIN) 25 MG tablet Take 0.5 tablets (12.5 mg total) by mouth daily. 90 tablet 2  .  betamethasone dipropionate 0.05 % lotion Apply topically 2 (two) times daily. 60 mL 0  . Blood Glucose Monitoring Suppl (FREESTYLE LITE) DEVI Check blood sugar up to 4 times a day 1 each 0  . Calcium Carb-Cholecalciferol (CALCIUM 600 + D PO) Take 1 tablet by mouth 2 (two) times daily.    . Clobetasol Prop Crea-Coal Tar 0.05 & 2.3 % KIT To apply twice daily on the affected area. 1 kit 0  . clopidogrel (PLAVIX) 75 MG tablet Take 1 tablet (75 mg total) by mouth daily. 90 tablet 6  . diclofenac sodium (VOLTAREN) 1 % GEL Apply 2 g topically 4 (four) times daily. 100 g 1  . famotidine (PEPCID) 20 MG tablet Take 1 tablet (20 mg total) by mouth 2 (two) times daily. 180 tablet 3  . furosemide (LASIX) 40 MG tablet Take 1 tablet (40 mg total) by mouth daily. 90 tablet 3  . glucose blood (FREESTYLE LITE) test strip Check blood sugar 4 times a day 375 each 3  . Insulin Glargine (LANTUS SOLOSTAR) 100 UNIT/ML Solostar Pen Inject 44 Units into the skin daily at 10 pm. 45 mL 11  . Insulin Pen Needle 32G X 4 MM MISC Use with insulin pen once daily. diag code E11.8 insulin dependent 100 each 3  . insulin regular (NOVOLIN R) 100 units/mL injection Inject 0.14 mLs (14 Units total) into the skin 3 (three) times daily before meals. 20 mL 2  . Insulin Syringe-Needle U-100 31G X 15/64" 0.3 ML MISC Use to inject mealtime insulin 3 times a day Dx code 250.00 insulin requiring 300 each 3  . LANCETS ULTRA FINE MISC Check blood sugar 4 times a day 375 each 3  . metFORMIN (GLUCOPHAGE) 500 MG tablet Take 1 tablet (500 mg total) by mouth 2 (two) times daily with a meal. 180 tablet 3  . ONETOUCH DELICA LANCETS 56L MISC CHECK BLOOD SUGAR THREE TIMES DAILY 300 each 12  . potassium chloride (K-DUR) 10 MEQ tablet Take 1 tablet (10 mEq total) by mouth 2 (two) times daily. 180 tablet 3  . rosuvastatin (CRESTOR) 10 MG tablet Take 1 tablet (10 mg total) by mouth daily. 90 tablet 3  . traMADol (ULTRAM) 50 MG tablet Take 1 tablet (50 mg  total) by mouth daily as needed for severe pain. 30 tablet 3  . valsartan (DIOVAN) 320 MG tablet Take 1 tablet (320 mg total) by mouth daily. 90 tablet 3   No current facility-administered medications for this visit.    ROS: See HPI for pertinent positives and negatives.   Physical Examination  Filed Vitals:   01/18/15  1033 01/18/15 1038  BP: 144/67 140/64  Pulse: 59 56  Temp: 97.4 F (36.3 C)   TempSrc: Oral   Resp: 16   Height: 5' 0.5" (1.537 m)   Weight: 140 lb 4.8 oz (63.64 kg)    Body mass index is 26.94 kg/(m^2).  General: A&O x 3, WDWN, female. Gait: normal Eyes: PERRLA. Pulmonary: CTAB, without wheezes , rales or rhonchi. Cardiac: regular rhythm, no detected murmur.         Carotid Bruits Right Left   Negative Negative  Aorta is not palpable. Radial pulses: 2+ palpable and =                           VASCULAR EXAM: Extremities without ischemic changes, without Gangrene; without open wounds.                                                                                                          LE Pulses Right Left       FEMORAL  1+ palpable  2+ palpable        POPLITEAL  not palpable   not palpable       POSTERIOR TIBIAL  not palpable   not palpable        DORSALIS PEDIS      ANTERIOR TIBIAL 2+ palpable  not palpable    Abdomen: soft, NT, no palpable masses. Skin: no rashes, no ulcers. Musculoskeletal: no muscle wasting or atrophy.  Neurologic: A&O X 3; Appropriate Affect ; SENSATION: normal; MOTOR FUNCTION:  moving all extremities equally, motor strength 5/5 throughout. Speech is fluent/normal.  CN 2-12 intact.    Non-Invasive Vascular Imaging: DATE: 01/18/2015  ABI (Date: 01/18/2015)  R: 0.74 (0.91, 08/02/14), DP: triphasic, PT: biphasic, TBI: 0.56  L: 0.64 (0.71), DP: triphasic, PT: biphasic, TBI: 0.53   ASSESSMENT: SERAIAH NOWACK is a 75 y.o. female s/p stent placement in the left common iliac artery for a chronic total occlusion on  07/03/14. She no longer has claudication sx's with walking, has no signs of ischemia in her lower extremities.  ABI's today indicate moderate bilateral lower extremity arterial occlusive disease. Both ABI's worsened in the last 6 months. Fortunately she has never used tobacco, but her DM is not in control and she is sedentary with no barriers to walking other than the outdoor weather is not ideal.   PLAN:  Graduated walking program discussed.  Based on the patient's vascular studies and examination, pt will return to clinic in 3 months with ABI's per post angioplasty protocol.   I discussed in depth with the patient the nature of atherosclerosis, and emphasized the importance of maximal medical management including strict control of blood pressure, blood glucose, and lipid levels, obtaining regular exercise, and continued cessation of smoking.  The patient is aware that without maximal medical management the underlying atherosclerotic disease process will progress, limiting the benefit of any interventions.  The patient was given information about PAD including signs, symptoms, treatment, what symptoms should prompt the patient to  seek immediate medical care, and risk reduction measures to take.  Clemon Chambers, RN, MSN, FNP-C [facility] Phone: [phone]  Clinic MD: [facility]  01/18/2015 10:46 AM

## 2015-01-18 NOTE — Progress Notes (Signed)
Filed Vitals:   01/18/15 1033 01/18/15 1038  BP: 144/67 140/64  Pulse: 59 56  Temp: 97.4 F (36.3 C)   TempSrc: Oral   Resp: 16   Height: 5' 0.5" (1.537 m)   Weight: 140 lb 4.8 oz (63.64 kg)

## 2015-02-09 ENCOUNTER — Encounter (HOSPITAL_COMMUNITY): Payer: Self-pay | Admitting: Vascular Surgery

## 2015-02-28 ENCOUNTER — Encounter: Payer: Self-pay | Admitting: Internal Medicine

## 2015-02-28 ENCOUNTER — Ambulatory Visit (INDEPENDENT_AMBULATORY_CARE_PROVIDER_SITE_OTHER): Payer: PPO | Admitting: Internal Medicine

## 2015-02-28 VITALS — BP 127/58 | HR 60 | Temp 97.9°F | Ht 62.0 in | Wt 140.9 lb

## 2015-02-28 DIAGNOSIS — Z7984 Long term (current) use of oral hypoglycemic drugs: Secondary | ICD-10-CM | POA: Diagnosis not present

## 2015-02-28 DIAGNOSIS — K219 Gastro-esophageal reflux disease without esophagitis: Secondary | ICD-10-CM

## 2015-02-28 DIAGNOSIS — I129 Hypertensive chronic kidney disease with stage 1 through stage 4 chronic kidney disease, or unspecified chronic kidney disease: Secondary | ICD-10-CM | POA: Diagnosis not present

## 2015-02-28 DIAGNOSIS — Z794 Long term (current) use of insulin: Secondary | ICD-10-CM

## 2015-02-28 DIAGNOSIS — I10 Essential (primary) hypertension: Secondary | ICD-10-CM

## 2015-02-28 DIAGNOSIS — M8580 Other specified disorders of bone density and structure, unspecified site: Secondary | ICD-10-CM

## 2015-02-28 DIAGNOSIS — N183 Chronic kidney disease, stage 3 unspecified: Secondary | ICD-10-CM

## 2015-02-28 DIAGNOSIS — E1122 Type 2 diabetes mellitus with diabetic chronic kidney disease: Secondary | ICD-10-CM

## 2015-02-28 LAB — POCT GLYCOSYLATED HEMOGLOBIN (HGB A1C): HEMOGLOBIN A1C: 7.5

## 2015-02-28 LAB — GLUCOSE, CAPILLARY: GLUCOSE-CAPILLARY: 174 mg/dL — AB (ref 65–99)

## 2015-02-28 NOTE — Assessment & Plan Note (Signed)
BP Readings from Last 3 Encounters:  02/28/15 127/58  01/18/15 140/64  01/17/15 125/54    Lab Results  Component Value Date   NA 142 12/29/2014   K 4.3 12/29/2014   CREATININE 1.36* 12/29/2014    Assessment: Blood pressure control:  Controlled Progress toward BP goal:   At goal Comments: On Amlodipine 5 mg daily, atenolol 12.5 mg daily, valsartan 320 mg daily  Plan: Medications:  continue current medications Educational resources provided:   Self management tools provided:   Other plans: Follow up in 3 months

## 2015-02-28 NOTE — Assessment & Plan Note (Signed)
Patient states her reflux symptoms are well controlled with famotidine 20 mg daily. She denies any dysphagia or epigastric pain.   Plan: -continue famotidine 20 mg daily

## 2015-02-28 NOTE — Assessment & Plan Note (Signed)
Patient needs repeat DEXA, ordered, but not completed. Patient was worried about the cost.   Plan: -DEXA scan -Continue Vitamin D-Calcium supplementation

## 2015-02-28 NOTE — Patient Instructions (Signed)
CONTINUE ALL MEDICATIONS THE SAME.   FOLLOW UP IN 3 MONTHS.   PLEASE MAKE SURE YOU ARE TAKING AT LEAST 1200 UNITS OF CALCIUM AND 800 UNITS OF VITAMIN D PER DAY.  Osteoporosis Osteoporosis happens when your bones become thinner and weaker. Weak bones can break (fracture) more easily when you slip or fall. Bones most at risk of breaking are in the hip, wrist, and spine. HOME CARE  Get enough calcium and vitamin D. These nutrients are good for your bones.  Exercise as told by your doctor.  Do not use any tobacco products. This includes cigarettes, chewing tobacco, and electronic cigarettes. If you need help quitting, ask your doctor.  Limit the amount of alcohol you drink.  Take medicines only as told by your doctor.  Keep all follow-up visits as told by your doctor. This is important.  Take care at home to prevent falls. Some ways to do this are:  Keep rooms well lit and tidy.  Put safety rails on your stairs.  Put a rubber mat in the bathroom and other places that are often wet or slippery. GET HELP RIGHT AWAY IF:  You fall.  You hurt yourself.   This information is not intended to replace advice given to you by your health care provider. Make sure you discuss any questions you have with your health care provider.   Document Released: 03/24/2011 Document Revised: 01/20/2014 Document Reviewed: 06/09/2013 Elsevier Interactive Patient Education [employer].

## 2015-02-28 NOTE — Progress Notes (Signed)
Subjective:    Patient ID: [name], female    DOB: May 14, 1940, 74 y.o.   MRN: [id number]  HPI [name] is a 75 y.o. female with PMHx of T2DM, HTN, HLD, PVD who presents to the clinic for T2DM. Please see A&P for the status of the patient's chronic medical problems.   Past Medical History  Diagnosis Date  . DM (diabetes mellitus) ([place])     insulin dependent  . Lumbar spinal stenosis   . History of colonic polyps   . Diverticulosis   . Nephrolithiasis     hx of  . HTN (hypertension)   . HLD (hyperlipidemia)   . PVD (peripheral vascular disease) ([facility])     ABI indicated moderated reduction arterial flow on the left.  Marland Kitchen CAD (coronary artery disease) 2006    s/p Quadrupal CABG 2006  . MI (myocardial infarction) ([place]) 2003    . [place]  . Tricuspid regurgitation 2009    moderate to severe, EF 55%  . Psoriasis   . OA (osteoarthritis)   . CKD (chronic kidney disease) stage 4, GFR 15-29 ml/min (HCC)     fluctuating between stage 3 and 4 depending on GFR  . Osteopenia 2010    T score of -1.8  . CHF (congestive heart failure) ([place]) 2010    EF 30-35% from Echo 06/2008  . MYOCARDIAL INFARCTION, HX OF 09/09/2006    Qualifier: Diagnosis of  By: Marinda Elk MD, Sonia Side    . DIVERTICULOSIS OF COLON 04/01/2007    Qualifier: Diagnosis of  By: Nelson-Smith CMA (AAMA), [name]    . NEPHROLITHIASIS 04/01/2007    Qualifier: Diagnosis of  By: Harlon Ditty CMA (AAMA), [name]    . Mouth ulcer adjacent to jaw bone 08/22/2010    S/p removal by ENT 09/2010     Outpatient Encounter Prescriptions as of 02/28/2015  Medication Sig  . ACCU-CHEK FASTCLIX LANCETS MISC Check blood sugar 3 times a day  . amLODipine (NORVASC) 5 MG tablet Take 1 tablet (5 mg total) by mouth daily.  Marland Kitchen aspirin EC 81 MG tablet Take 81 mg by mouth daily.  Marland Kitchen atenolol (TENORMIN) 25 MG tablet Take 0.5 tablets (12.5 mg total) by mouth daily.  . betamethasone dipropionate 0.05 % lotion Apply topically 2 (two) times daily.   . Blood Glucose Monitoring Suppl (FREESTYLE LITE) DEVI Check blood sugar up to 4 times a day  . Calcium Carb-Cholecalciferol (CALCIUM 600 + D PO) Take 1 tablet by mouth 2 (two) times daily.  . Clobetasol Prop Crea-Coal Tar 0.05 & 2.3 % KIT To apply twice daily on the affected area.  . clopidogrel (PLAVIX) 75 MG tablet Take 1 tablet (75 mg total) by mouth daily.  . diclofenac sodium (VOLTAREN) 1 % GEL Apply 2 g topically 4 (four) times daily.  . famotidine (PEPCID) 20 MG tablet Take 1 tablet (20 mg total) by mouth 2 (two) times daily.  . furosemide (LASIX) 40 MG tablet Take 1 tablet (40 mg total) by mouth daily.  Marland Kitchen glucose blood (FREESTYLE LITE) test strip Check blood sugar 4 times a day  . Insulin Glargine (LANTUS SOLOSTAR) 100 UNIT/ML Solostar Pen Inject 44 Units into the skin daily at 10 pm.  . Insulin Pen Needle 32G X 4 MM MISC Use with insulin pen once daily. diag code E11.8 insulin dependent  . insulin regular (NOVOLIN R) 100 units/mL injection Inject 0.14 mLs (14 Units total) into the skin 3 (three) times daily before meals.  . Insulin  Syringe-Needle U-100 31G X 15/64" 0.3 ML MISC Use to inject mealtime insulin 3 times a day Dx code 250.00 insulin requiring  . LANCETS ULTRA FINE MISC Check blood sugar 4 times a day  . metFORMIN (GLUCOPHAGE) 500 MG tablet Take 1 tablet (500 mg total) by mouth 2 (two) times daily with a meal.  . ONETOUCH DELICA LANCETS 81W MISC CHECK BLOOD SUGAR THREE TIMES DAILY  . potassium chloride (K-DUR) 10 MEQ tablet Take 1 tablet (10 mEq total) by mouth 2 (two) times daily.  . rosuvastatin (CRESTOR) 10 MG tablet Take 1 tablet (10 mg total) by mouth daily.  . traMADol (ULTRAM) 50 MG tablet Take 1 tablet (50 mg total) by mouth daily as needed for severe pain.  . valsartan (DIOVAN) 320 MG tablet Take 1 tablet (320 mg total) by mouth daily.   No facility-administered encounter medications on file as of 02/28/2015.    Family History  Problem Relation Age of Onset  .  Cirrhosis Brother   . Heart disease Brother   . Heart attack Brother   . Hypertension Mother   . Hyperlipidemia Mother   . Hyperlipidemia Son   . Hypertension Son     Social History   Social History  . Marital Status: Widowed    Spouse Name: N/A  . Number of Children: N/A  . Years of Education: N/A   Occupational History  . Not on file.   Social History Main Topics  . Smoking status: Never Smoker   . Smokeless tobacco: Not on file  . Alcohol Use: No  . Drug Use: No  . Sexual Activity: Not on file   Other Topics Concern  . Not on file   Social History Narrative   Retired Event organiser   Widow/Widower   lives in [place]   4 kids (2 sons, 2 daughters) one son died many years ago in car accident.  other kids are all in [place].   Has friend that helps take care of her    Review of Systems General: Denies fatigue, change in appetite.  Respiratory: Denies SOB, DOE, chest tightness.   Cardiovascular: Denies chest pain and palpitations.  Gastrointestinal: Denies diarrhea, constipation.  Endocrine: Denies polyuria, and polydipsia. Neurological: Denies dizziness, headaches, weakness, lightheadedness     Objective:   Physical Exam Filed Vitals:   02/28/15 1457  BP: 127/58  Pulse: 60  Temp: 97.9 F (36.6 C)  TempSrc: Oral  Height: _0  (1.575 m)  Weight: 140 lb 14.4 oz (63.912 kg)  SpO2: 99%   General: Vital signs reviewed.  Patient is elderly, in no acute distress and cooperative with exam.   Cardiovascular: RRR, S1 normal, S2 normal, no murmurs. Pulmonary/Chest: Clear to auscultation bilaterally, no wheezes, rales, or rhonchi. Abdominal: Soft, non-tender, non-distended, BS +.  Extremities: No lower extremity edema bilaterally, pulses symmetric and intact bilaterally.  Skin: Warm, dry and intact.  Psychiatric: Normal mood and affect. speech and behavior is normal.      Assessment & Plan:   Please see problem based assessment and plan.

## 2015-02-28 NOTE — Assessment & Plan Note (Signed)
Lab Results  Component Value Date   HGBA1C 7.5 02/28/2015   HGBA1C 7.4 12/04/2014   HGBA1C 7.3 08/31/2014     Assessment: Diabetes control:  Controlled, at goal Progress toward A1C goal:   At goal Comments: Given life expectancy, I would estimate this patient's A1c goal to be 7.5 to 8. She is currently on Lantus 40 units QHS and Novolog 14 units TID. Two hypoglycemic episodes noted one at 44 around 5 pm and 67 at lunchtime.   Plan: Medications:  continue current medications Home glucose monitoring: Frequency:  TID Timing:  ACHS Instruction/counseling given: discussed diet Educational resources provided:   Self management tools provided: copy of home glucose meter download Other plans: Could consider adding januvia in the future given low risk of hypoglycemia. We are limited with metformin due to CKD.

## 2015-03-05 NOTE — Progress Notes (Signed)
Internal Medicine Clinic Attending  Case discussed with Dr. Richardson soon after the resident saw the patient.  We reviewed the resident's history and exam and pertinent patient test results.  I agree with the assessment, diagnosis, and plan of care documented in the resident's note. 

## 2015-03-07 ENCOUNTER — Other Ambulatory Visit: Payer: Self-pay | Admitting: Internal Medicine

## 2015-04-17 ENCOUNTER — Encounter: Payer: Self-pay | Admitting: Family

## 2015-04-25 ENCOUNTER — Ambulatory Visit (HOSPITAL_COMMUNITY)
Admission: RE | Admit: 2015-04-25 | Discharge: 2015-04-25 | Disposition: A | Discharge status: Home / Self Care | Payer: PPO | Source: Ambulatory Visit | Attending: Family | Admitting: Family

## 2015-04-25 ENCOUNTER — Ambulatory Visit: Payer: PPO | Admitting: Family

## 2015-04-25 DIAGNOSIS — E1151 Type 2 diabetes mellitus with diabetic peripheral angiopathy without gangrene: Secondary | ICD-10-CM | POA: Diagnosis not present

## 2015-04-25 DIAGNOSIS — Z95828 Presence of other vascular implants and grafts: Secondary | ICD-10-CM | POA: Diagnosis not present

## 2015-04-25 DIAGNOSIS — I779 Disorder of arteries and arterioles, unspecified: Secondary | ICD-10-CM

## 2015-04-25 DIAGNOSIS — Z48812 Encounter for surgical aftercare following surgery on the circulatory system: Secondary | ICD-10-CM

## 2015-05-30 ENCOUNTER — Encounter: Payer: PPO | Admitting: Internal Medicine

## 2015-06-06 ENCOUNTER — Encounter: Payer: Self-pay | Admitting: Internal Medicine

## 2015-06-06 ENCOUNTER — Ambulatory Visit (INDEPENDENT_AMBULATORY_CARE_PROVIDER_SITE_OTHER): Payer: PPO | Admitting: Internal Medicine

## 2015-06-06 VITALS — BP 135/50 | HR 64 | Temp 98.0°F | Ht 62.0 in | Wt 137.8 lb

## 2015-06-06 DIAGNOSIS — I10 Essential (primary) hypertension: Secondary | ICD-10-CM

## 2015-06-06 DIAGNOSIS — Z794 Long term (current) use of insulin: Secondary | ICD-10-CM | POA: Diagnosis not present

## 2015-06-06 DIAGNOSIS — I129 Hypertensive chronic kidney disease with stage 1 through stage 4 chronic kidney disease, or unspecified chronic kidney disease: Secondary | ICD-10-CM | POA: Diagnosis not present

## 2015-06-06 DIAGNOSIS — E785 Hyperlipidemia, unspecified: Secondary | ICD-10-CM

## 2015-06-06 DIAGNOSIS — Z951 Presence of aortocoronary bypass graft: Secondary | ICD-10-CM

## 2015-06-06 DIAGNOSIS — N183 Chronic kidney disease, stage 3 unspecified: Secondary | ICD-10-CM

## 2015-06-06 DIAGNOSIS — I2510 Atherosclerotic heart disease of native coronary artery without angina pectoris: Secondary | ICD-10-CM

## 2015-06-06 DIAGNOSIS — E1122 Type 2 diabetes mellitus with diabetic chronic kidney disease: Secondary | ICD-10-CM | POA: Diagnosis not present

## 2015-06-06 DIAGNOSIS — Z7982 Long term (current) use of aspirin: Secondary | ICD-10-CM

## 2015-06-06 DIAGNOSIS — Z79899 Other long term (current) drug therapy: Secondary | ICD-10-CM

## 2015-06-06 DIAGNOSIS — L409 Psoriasis, unspecified: Secondary | ICD-10-CM

## 2015-06-06 LAB — GLUCOSE, CAPILLARY: Glucose-Capillary: 156 mg/dL — ABNORMAL HIGH (ref 65–99)

## 2015-06-06 LAB — POCT GLYCOSYLATED HEMOGLOBIN (HGB A1C): Hemoglobin A1C: 6.9

## 2015-06-06 MED ORDER — INSULIN REGULAR HUMAN 100 UNIT/ML IJ SOLN: INTRAMUSCULAR | Status: DC

## 2015-06-06 MED ORDER — ROSUVASTATIN CALCIUM 20 MG PO TABS: 20.0000 mg | ORAL_TABLET | Freq: Every day | ORAL | Status: DC

## 2015-06-06 NOTE — Patient Instructions (Signed)
FOR YOUR DIABETES: -CONTINUE TAKING LANTUS 44 UNITS AT NIGHT -TAKE NOVOLIN 14 UNITS BEFORE BREAKFAST, 10 UNITS BEFORE LUNCH AND 14 UNITS BEFORE DINNER -STOP TAKING METFORMIN  FOR YOUR CHOLESTEROL: TAKE ROSUVASTATIN 20 MG A DAY  FOLLOW UP IN 3 MONTHS

## 2015-06-06 NOTE — Progress Notes (Signed)
Subjective:    Patient ID: [name], female    DOB: Oct 31, 1940, 75 y.o.   MRN: [id number]  HPI [name] is a 75 y.o. female with PMHx of HTN, chronic HFrEF, PVD, GERD, T2DM and CKD III who presents to the clinic for follow up for CAD, HTN, T2DM. Please see A&P for the status of the patient's chronic medical problems.   Past Medical History  Diagnosis Date  . DM (diabetes mellitus) ([place])     insulin dependent  . Lumbar spinal stenosis   . History of colonic polyps   . Diverticulosis   . Nephrolithiasis     hx of  . HTN (hypertension)   . HLD (hyperlipidemia)   . PVD (peripheral vascular disease) (HCC)     ABI indicated moderated reduction arterial flow on the left.  Marland Kitchen CAD (coronary artery disease) 2006    s/p Quadrupal CABG 2006  . MI (myocardial infarction) ([place]) 2003    . [place]  . Tricuspid regurgitation 2009    moderate to severe, EF 55%  . Psoriasis   . OA (osteoarthritis)   . CKD (chronic kidney disease) stage 4, GFR 15-29 ml/min (HCC)     fluctuating between stage 3 and 4 depending on GFR  . Osteopenia 2010    T score of -1.8  . CHF (congestive heart failure) ([place]) 2010    EF 30-35% from Echo 06/2008  . MYOCARDIAL INFARCTION, HX OF 09/09/2006    Qualifier: Diagnosis of  By: Marinda Elk MD, Sonia Side    . DIVERTICULOSIS OF COLON 04/01/2007    Qualifier: Diagnosis of  By: Nelson-Smith CMA (AAMA), [name]    . NEPHROLITHIASIS 04/01/2007    Qualifier: Diagnosis of  By: Harlon Ditty CMA (AAMA), [name]    . Mouth ulcer adjacent to jaw bone 08/22/2010    S/p removal by ENT 09/2010     Outpatient Encounter Prescriptions as of 06/06/2015  Medication Sig  . ACCU-CHEK FASTCLIX LANCETS MISC Check blood sugar 3 times a day  . amLODipine (NORVASC) 5 MG tablet Take 1 tablet (5 mg total) by mouth daily.  Marland Kitchen aspirin EC 81 MG tablet Take 81 mg by mouth daily.  Marland Kitchen atenolol (TENORMIN) 25 MG tablet Take 0.5 tablets (12.5 mg total) by mouth daily.  . betamethasone  dipropionate 0.05 % lotion Apply topically 2 (two) times daily.  . Blood Glucose Monitoring Suppl (FREESTYLE LITE) DEVI Check blood sugar up to 4 times a day  . Calcium Carb-Cholecalciferol (CALCIUM 600 + D PO) Take 1 tablet by mouth 2 (two) times daily.  . Clobetasol Prop Crea-Coal Tar 0.05 & 2.3 % KIT To apply twice daily on the affected area.  . clopidogrel (PLAVIX) 75 MG tablet Take 1 tablet (75 mg total) by mouth daily.  . diclofenac sodium (VOLTAREN) 1 % GEL Apply 2 g topically 4 (four) times daily.  . famotidine (PEPCID) 20 MG tablet Take 1 tablet (20 mg total) by mouth 2 (two) times daily.  . furosemide (LASIX) 40 MG tablet Take 1 tablet (40 mg total) by mouth daily.  Marland Kitchen glucose blood (FREESTYLE LITE) test strip Check blood sugar 4 times a day  . Insulin Glargine (LANTUS SOLOSTAR) 100 UNIT/ML Solostar Pen Inject 44 Units into the skin daily at 10 pm.  . Insulin Pen Needle 32G X 4 MM MISC Use with insulin pen once daily. diag code E11.8 insulin dependent  . insulin regular (NOVOLIN R) 100 units/mL injection INJECT 14 UNITS BEFORE BREAKFAST, 10 UNITS BEFORE  LUNCH AND 14 UNITS BEFORE DINNER  . Insulin Syringe-Needle U-100 31G X 15/64" 0.3 ML MISC Use to inject mealtime insulin 3 times a day Dx code 250.00 insulin requiring  . LANCETS ULTRA FINE MISC Check blood sugar 4 times a day  . ONETOUCH DELICA LANCETS 32D MISC CHECK BLOOD SUGAR THREE TIMES DAILY  . potassium chloride (K-DUR) 10 MEQ tablet Take 1 tablet (10 mEq total) by mouth 2 (two) times daily.  . rosuvastatin (CRESTOR) 20 MG tablet Take 1 tablet (20 mg total) by mouth daily.  . traMADol (ULTRAM) 50 MG tablet Take 1 tablet (50 mg total) by mouth daily as needed for severe pain.  . valsartan (DIOVAN) 320 MG tablet Take 1 tablet (320 mg total) by mouth daily.  . [DISCONTINUED] metFORMIN (GLUCOPHAGE) 500 MG tablet Take 1 tablet (500 mg total) by mouth 2 (two) times daily with a meal.  . [DISCONTINUED] NOVOLIN R 100 UNIT/ML injection  INJECT 14 UNITS(0.14 MLS) INTO THE SKIN THREE TIMES DAILY BEFORE A MEAL  . [DISCONTINUED] rosuvastatin (CRESTOR) 10 MG tablet Take 1 tablet (10 mg total) by mouth daily.   No facility-administered encounter medications on file as of 06/06/2015.    Family History  Problem Relation Age of Onset  . Cirrhosis Brother   . Heart disease Brother   . Heart attack Brother   . Hypertension Mother   . Hyperlipidemia Mother   . Hyperlipidemia Son   . Hypertension Son     Social History   Social History  . Marital Status: Widowed    Spouse Name: N/A  . Number of Children: N/A  . Years of Education: N/A   Occupational History  . Not on file.   Social History Main Topics  . Smoking status: Never Smoker   . Smokeless tobacco: Not on file  . Alcohol Use: No  . Drug Use: No  . Sexual Activity: Not on file   Other Topics Concern  . Not on file   Social History Narrative   Retired Event organiser   Widow/Widower   lives in [place]   4 kids (2 sons, 2 daughters) one son died many years ago in car accident.  other kids are all in [place].   Has friend that helps take care of her   Review of Systems General: Denies fatigue, change in appetite and diaphoresis.  Respiratory: Admits to chronic DOE (unchanged). Denies SOB, cough, chest tightness, and wheezing.  Cardiovascular: Denies chest pain and palpitations.  Gastrointestinal: Denies nausea, vomiting, abdominal pain, constipation Endocrine: Denies polyuria, and polydipsia. Musculoskeletal: Denies myalgias, and gait problem.  Neurological: Denies dizziness, headaches, weakness, lightheadedness     Objective:   Physical Exam Filed Vitals:   06/06/15 1404  BP: 135/50  Pulse: 64  Temp: 98 F (36.7 C)  TempSrc: Oral  Height: 5' 2"  (1.575 m)  Weight: 137 lb 12.8 oz (62.506 kg)  SpO2: 96%   General: Vital signs reviewed.  Patient is elderly, in no acute distress and cooperative with exam.  Head: Normocephalic and  atraumatic. Eyes: Conjunctivae normal, no scleral icterus.  Neck: Supple, trachea midline, no carotid bruit present.  Cardiovascular: RRR, S1 normal, S2 normal, no murmurs, gallops, or rubs. Pulmonary/Chest: Clear to auscultation bilaterally, no wheezes, rales, or rhonchi. Abdominal: Soft, non-tender, non-distended, BS + Extremities: No lower extremity edema bilaterally Skin: Warm, dry and intact.  Psychiatric: Normal mood and affect. speech and behavior is normal. Cognition and memory are grossly normal.     Assessment &  Plan:   Please see problem based assessment and plan.

## 2015-06-06 NOTE — Assessment & Plan Note (Signed)
Increased Rosuvastatin to 20 mg daily.

## 2015-06-06 NOTE — Assessment & Plan Note (Addendum)
Patient has history of CAD s/p CABG. Echo in 2016 showed: Normal LV systolic function; grade 1 diastolic dysfunction; mild LVH; mild LAE; mild TR; mildly elevated pulmonary pressure. Per notes, stress test in 2012 was normal. Patient is on ASA 81 mg daily, Plavix 75 mg daily, atenolol 12.5 mg daily, and valsartan 320 mg daily. She is not currently following with Cardiology, but she denies any anginal symptoms. She has DOE, which she states is chronic and unchanged for her.   Plan: -Continue ASA 81 mg daily, Plavix 75 mg daily, atenolol 12.5 mg daily, and valsartan 320 mg daily -Will increase rosuvastatin to 20 mg daily for high intensity given CAD

## 2015-06-06 NOTE — Assessment & Plan Note (Addendum)
BP Readings from Last 3 Encounters:  06/06/15 135/50  02/28/15 127/58  01/18/15 140/64    Lab Results  Component Value Date   NA 142 12/29/2014   K 4.3 12/29/2014   CREATININE 1.36* 12/29/2014    Assessment: Blood pressure control:  Controlled Progress toward BP goal:   Stable Comments: Patient is on atenolol 12.5 mg daily, amlodipine 5 mg daily, and valsartan 320 mg daily.   Plan: Medications:  continue current medications Educational resources provided:   Self management tools provided:   Other plans: Follow up in 3 months

## 2015-06-06 NOTE — Assessment & Plan Note (Signed)
Discontinued metformin. Will repeat BMET today.

## 2015-06-06 NOTE — Assessment & Plan Note (Addendum)
Lab Results  Component Value Date   HGBA1C 6.9 06/06/2015   HGBA1C 7.5 02/28/2015   HGBA1C 7.4 12/04/2014     Assessment: Diabetes control:  Controlled, but with several late afternoon hypoglycemic events Progress toward A1C goal:    At goal Comments: Patient is on Metformin 500 mg BID, Lantus 40 units QHS and Novolog 14 units TID.  Plan: Medications: D/C metformin d/t CKD. Continue Lantus 44 units QHS given well controlled fasting glucoses. Decrease to Novolog 14 units with breakfast, 10 units at lunch, 14 units with dinner.  Home glucose monitoring: Frequency:  QID Timing:  ACHS Instruction/counseling given: discussed the need for weight loss and discussed diet Educational resources provided:   Self management tools provided: copy of home glucose meter download Other plans: Follow up in 3 months.

## 2015-06-06 NOTE — Assessment & Plan Note (Signed)
Follows with dermatology and is currently on Humira injections.

## 2015-06-07 LAB — BMP8+ANION GAP
ANION GAP: 19 mmol/L — AB (ref 10.0–18.0)
BUN/Creatinine Ratio: 11 — ABNORMAL LOW (ref 12–28)
BUN: 17 mg/dL (ref 8–27)
CALCIUM: 9.8 mg/dL (ref 8.7–10.3)
CHLORIDE: 97 mmol/L (ref 96–106)
CO2: 28 mmol/L (ref 18–29)
Creatinine, Ser: 1.57 mg/dL — ABNORMAL HIGH (ref 0.57–1.00)
GFR calc Af Amer: 36 mL/min/{1.73_m2} — ABNORMAL LOW (ref >59)
GFR, EST NON AFRICAN AMERICAN: 32 mL/min/{1.73_m2} — AB (ref >59)
GLUCOSE: 134 mg/dL — AB (ref 65–99)
POTASSIUM: 4.1 mmol/L (ref 3.5–5.2)
Sodium: 144 mmol/L (ref 134–144)

## 2015-06-07 NOTE — Progress Notes (Signed)
Case discussed with Dr. Burns soon after the resident saw the patient.  We reviewed the resident's history and exam and pertinent patient test results.  I agree with the assessment, diagnosis and plan of care documented in the resident's note. 

## 2015-06-12 ENCOUNTER — Encounter: Payer: Self-pay | Admitting: Family

## 2015-06-19 ENCOUNTER — Ambulatory Visit (INDEPENDENT_AMBULATORY_CARE_PROVIDER_SITE_OTHER): Payer: PPO | Admitting: Family

## 2015-06-19 ENCOUNTER — Encounter: Payer: Self-pay | Admitting: Family

## 2015-06-19 ENCOUNTER — Ambulatory Visit (HOSPITAL_COMMUNITY)
Admission: RE | Admit: 2015-06-19 | Discharge: 2015-06-19 | Disposition: A | Discharge status: Home / Self Care | Payer: PPO | Source: Ambulatory Visit | Attending: Family | Admitting: Family

## 2015-06-19 VITALS — BP 134/67 | HR 60 | Ht 62.0 in | Wt 136.1 lb

## 2015-06-19 DIAGNOSIS — E1151 Type 2 diabetes mellitus with diabetic peripheral angiopathy without gangrene: Secondary | ICD-10-CM | POA: Insufficient documentation

## 2015-06-19 DIAGNOSIS — Z95828 Presence of other vascular implants and grafts: Secondary | ICD-10-CM | POA: Diagnosis not present

## 2015-06-19 DIAGNOSIS — Z48812 Encounter for surgical aftercare following surgery on the circulatory system: Secondary | ICD-10-CM | POA: Insufficient documentation

## 2015-06-19 DIAGNOSIS — E785 Hyperlipidemia, unspecified: Secondary | ICD-10-CM | POA: Insufficient documentation

## 2015-06-19 DIAGNOSIS — I10 Essential (primary) hypertension: Secondary | ICD-10-CM | POA: Diagnosis not present

## 2015-06-19 DIAGNOSIS — I779 Disorder of arteries and arterioles, unspecified: Secondary | ICD-10-CM | POA: Diagnosis not present

## 2015-06-19 DIAGNOSIS — Z95820 Peripheral vascular angioplasty status with implants and grafts: Secondary | ICD-10-CM | POA: Insufficient documentation

## 2015-06-19 NOTE — Progress Notes (Signed)
[facility] [place] [facility]   CC: Follow up peripheral artery occlusive disease  History of Present Illness [name] is a 75 y.o. female patient of Dr. Scot Dock who had presented with progressive ischemia of the left lower extremity and significant rest pain. She had no left femoral pulse. Of note her ABI was 25% and on exam she had evidence of multilevel arterial occlusive disease.  She was taken to the peripheral vascular lab on 07/03/2014. She underwent PTA and 6 mm x 29 mm stent of the left common iliac artery for a chronic total occlusion. There was no residual stenosis.  Pt denies claudication sx's with walking, denies non healing wounds.  She has started walking a little more since her last visit, she has no barriers to walking other than likely deconditioning.   She has done well since the procedure and denies rest pain in her left foot now. He is not a smoker. She is on 81 mg of aspirin. She is on Plavix. She is on a statin.  Pt denies any hx of stroke or TIA.   The patient denies New Medical or Surgical History.  Pt Diabetic: Yes, A1C was 7.4 in November 2016, 6.9 in May 2017 (review of records) Pt smoker: non-smoker  Pt meds include: Statin :Yes Betablocker: Yes ASA: Yes Other anticoagulants/antiplatelets: Plavix     Past Medical History  Diagnosis Date  . DM (diabetes mellitus) ([place])     insulin dependent  . Lumbar spinal stenosis   . History of colonic polyps   . Diverticulosis   . Nephrolithiasis     hx of  . HTN (hypertension)   . HLD (hyperlipidemia)   . PVD (peripheral vascular disease) ([facility])     ABI indicated moderated reduction arterial flow on the left.  Marland Kitchen CAD (coronary artery disease) 2006    s/p Quadrupal CABG 2006  . MI (myocardial infarction) ([place]) 2003    . [place]  . Tricuspid regurgitation 2009    moderate to severe, EF 55%  . Psoriasis   . OA (osteoarthritis)   . CKD (chronic kidney disease) stage 4, GFR 15-29  ml/min (HCC)     fluctuating between stage 3 and 4 depending on GFR  . Osteopenia 2010    T score of -1.8  . CHF (congestive heart failure) ([place]) 2010    EF 30-35% from Echo 06/2008  . MYOCARDIAL INFARCTION, HX OF 09/09/2006    Qualifier: Diagnosis of  By: Marinda Elk MD, Sonia Side    . DIVERTICULOSIS OF COLON 04/01/2007    Qualifier: Diagnosis of  By: Nelson-Smith CMA (AAMA), [name]    . NEPHROLITHIASIS 04/01/2007    Qualifier: Diagnosis of  By: Harlon Ditty CMA (AAMA), [name]    . Mouth ulcer adjacent to jaw bone 08/22/2010    S/p removal by ENT 09/2010     Social History Social History  Substance Use Topics  . Smoking status: Never Smoker   . Smokeless tobacco: None  . Alcohol Use: No    Family History Family History  Problem Relation Age of Onset  . Cirrhosis Brother   . Heart disease Brother   . Heart attack Brother   . Hypertension Mother   . Hyperlipidemia Mother   . Hyperlipidemia Son   . Hypertension Son     Past Surgical History  Procedure Laterality Date  . Laparoscopic supracervical hysterectomy    . Coronary artery bypass graft  2006    4 vessel  . Coronary angioplasty with  stent placement  2003  . Cataract extraction  2008    left eye  . Peripheral vascular catheterization N/A 07/03/2014    Procedure: Abdominal Aortogram w/Lower Extremity;  Surgeon: Angelia Mould, MD;  Location: [facility];  Service: Cardiovascular;  Laterality: N/A;    No Known Allergies  Current Outpatient Prescriptions  Medication Sig Dispense Refill  . ACCU-CHEK FASTCLIX LANCETS MISC Check blood sugar 3 times a day 102 each 12  . amLODipine (NORVASC) 5 MG tablet Take 1 tablet (5 mg total) by mouth daily. 90 tablet 3  . aspirin EC 81 MG tablet Take 81 mg by mouth daily.    Marland Kitchen atenolol (TENORMIN) 25 MG tablet Take 0.5 tablets (12.5 mg total) by mouth daily. 90 tablet 2  . betamethasone dipropionate 0.05 % lotion Apply topically 2 (two) times daily. 60 mL 0  . Blood Glucose  Monitoring Suppl (FREESTYLE LITE) DEVI Check blood sugar up to 4 times a day 1 each 0  . Calcium Carb-Cholecalciferol (CALCIUM 600 + D PO) Take 1 tablet by mouth 2 (two) times daily.    . Clobetasol Prop Crea-Coal Tar 0.05 & 2.3 % KIT To apply twice daily on the affected area. 1 kit 0  . clopidogrel (PLAVIX) 75 MG tablet Take 1 tablet (75 mg total) by mouth daily. 90 tablet 6  . diclofenac sodium (VOLTAREN) 1 % GEL Apply 2 g topically 4 (four) times daily. 100 g 1  . famotidine (PEPCID) 20 MG tablet Take 1 tablet (20 mg total) by mouth 2 (two) times daily. 180 tablet 3  . furosemide (LASIX) 40 MG tablet Take 1 tablet (40 mg total) by mouth daily. 90 tablet 3  . glucose blood (FREESTYLE LITE) test strip Check blood sugar 4 times a day 375 each 3  . Insulin Glargine (LANTUS SOLOSTAR) 100 UNIT/ML Solostar Pen Inject 44 Units into the skin daily at 10 pm. 45 mL 11  . Insulin Pen Needle 32G X 4 MM MISC Use with insulin pen once daily. diag code E11.8 insulin dependent 100 each 3  . insulin regular (NOVOLIN R) 100 units/mL injection INJECT 14 UNITS BEFORE BREAKFAST, 10 UNITS BEFORE LUNCH AND 14 UNITS BEFORE DINNER 15 mL 11  . Insulin Syringe-Needle U-100 31G X 15/64" 0.3 ML MISC Use to inject mealtime insulin 3 times a day Dx code 250.00 insulin requiring 300 each 3  . LANCETS ULTRA FINE MISC Check blood sugar 4 times a day 375 each 3  . ONETOUCH DELICA LANCETS 74B MISC CHECK BLOOD SUGAR THREE TIMES DAILY 300 each 12  . potassium chloride (K-DUR) 10 MEQ tablet Take 1 tablet (10 mEq total) by mouth 2 (two) times daily. 180 tablet 3  . rosuvastatin (CRESTOR) 20 MG tablet Take 1 tablet (20 mg total) by mouth daily. 90 tablet 3  . traMADol (ULTRAM) 50 MG tablet Take 1 tablet (50 mg total) by mouth daily as needed for severe pain. 30 tablet 3  . valsartan (DIOVAN) 320 MG tablet Take 1 tablet (320 mg total) by mouth daily. 90 tablet 3   No current facility-administered medications for this visit.    ROS:  See HPI for pertinent positives and negatives.   Physical Examination  Filed Vitals:   06/19/15 1246  BP: 142/71  Pulse: 60  Height: 5' 2"  (1.575 m)  Weight: 136 lb 1.6 oz (61.735 kg)  SpO2: 95%   Body mass index is 24.89 kg/(m^2).  General: A&O x 3, WDWN, female. Gait: normal Eyes:  PERRLA. Pulmonary: Respirations are non labored, CTAB, no wheezes , rales or rhonchi. Occasional dry cough. Cardiac: regular rhythm, no detected murmur.     Carotid Bruits Right Left   Negative Negative  Aorta is not palpable. Radial pulses: 2+ palpable and =   VASCULAR EXAM: Extremities without ischemic changes, without Gangrene; without open wounds.     LE Pulses Right Left   FEMORAL 2+ palpable 2+ palpable    POPLITEAL not palpable  not palpable   POSTERIOR TIBIAL not palpable  not palpable    DORSALIS PEDIS  ANTERIOR TIBIAL 1+ palpable  1+ palpable    Abdomen: soft, NT, no palpable masses. Skin: no rashes, no ulcers. Musculoskeletal: no muscle wasting or atrophy. Neurologic: A&O X 3; Appropriate Affect ; SENSATION: normal; MOTOR FUNCTION: moving all extremities equally, motor strength 5/5 throughout in upper extremities, 4/5 in LE's. Speech is fluent/normal.  CN 2-12 intact.          Non-Invasive Vascular Imaging: DATE: 06/19/2015   ABI:  RIGHT: 0.80 (0.74, 01/18/15), Waveforms: monophasic PT, biphasic DP;  TBI: 0.58 LEFT: 0.74, (0.64), Waveforms: monophasic PT, biphasic DP, TBI: 0.54   ASSESSMENT: LATRENA BENEGAS is a 75 y.o. female who is s/p stent placement in the left common iliac artery for a chronic total occlusion on 07/03/14. She no longer has claudication sx's with walking, has no signs of ischemia in her lower extremities.  ABI's today  indicate mild arterial occlusive disease of the right leg with mono and biphasic waveforms and moderate arterial occlusive disease of the left leg, again with mono and biphasic waveforms. Slight improvement of ABI in both legs, but waveforms are less robust. She started a walking program, lost a few pounds, and has a slight improvement in her A1C whick is currently in control at 6.9. Fortunately she has never used tobacco.    PLAN:  Graduated walking program discussed and how to achieve.  Based on the patient's vascular studies and examination, pt will return to clinic in 6 months with ABI's.   I discussed in depth with the patient the nature of atherosclerosis, and emphasized the importance of maximal medical management including strict control of blood pressure, blood glucose, and lipid levels, obtaining regular exercise, and continued cessation of smoking.  The patient is aware that without maximal medical management the underlying atherosclerotic disease process will progress, limiting the benefit of any interventions.  The patient was given information about PAD including signs, symptoms, treatment, what symptoms should prompt the patient to seek immediate medical care, and risk reduction measures to take.  Clemon Chambers, RN, MSN, FNP-C [facility] Phone: [phone]  Clinic MD: Trula Slade on call  06/19/2015 12:49 PM

## 2015-06-19 NOTE — Patient Instructions (Signed)
Peripheral Vascular Disease Peripheral vascular disease (PVD) is a disease of the blood vessels that are not part of your heart and brain. A simple term for PVD is poor circulation. In most cases, PVD narrows the blood vessels that carry blood from your heart to the rest of your body. This can result in a decreased supply of blood to your arms, legs, and internal organs, like your stomach or kidneys. However, it most often affects a person's lower legs and feet. There are two types of PVD.  Organic PVD. This is the more common type. It is caused by damage to the structure of blood vessels.  Functional PVD. This is caused by conditions that make blood vessels contract and tighten (spasm). Without treatment, PVD tends to get worse over time. PVD can also lead to acute ischemic limb. This is when an arm or limb suddenly has trouble getting enough blood. This is a medical emergency. CAUSES Each type of PVD has many different causes. The most common cause of PVD is buildup of a fatty material (plaque) inside of your arteries (atherosclerosis). Small amounts of plaque can break off from the walls of the blood vessels and become lodged in a smaller artery. This blocks blood flow and can cause acute ischemic limb. Other common causes of PVD include:  Blood clots that form inside of blood vessels.  Injuries to blood vessels.  Diseases that cause inflammation of blood vessels or cause blood vessel spasms.  Health behaviors and health history that increase your risk of developing PVD. RISK FACTORS  You may have a greater risk of PVD if you:  Have a family history of PVD.  Have certain medical conditions, including:  High cholesterol.  Diabetes.  High blood pressure (hypertension).  Coronary heart disease.  Past problems with blood clots.  Past injury, such as burns or a broken bone. These may have damaged blood vessels in your limbs.  Buerger disease. This is caused by inflamed blood  vessels in your hands and feet.  Some forms of arthritis.  Rare birth defects that affect the arteries in your legs.  Use tobacco.  Do not get enough exercise.  Are obese.  Are age 50 or older. SIGNS AND SYMPTOMS  PVD may cause many different symptoms. Your symptoms depend on what part of your body is not getting enough blood. Some common signs and symptoms include:  Cramps in your lower legs. This may be a symptom of poor leg circulation (claudication).  Pain and weakness in your legs while you are physically active that goes away when you rest (intermittent claudication).  Leg pain when at rest.  Leg numbness, tingling, or weakness.  Coldness in a leg or foot, especially when compared with the other leg.  Skin or hair changes. These can include:  Hair loss.  Shiny skin.  Pale or bluish skin.  Thick toenails.  Inability to get or maintain an erection (erectile dysfunction). People with PVD are more prone to developing ulcers and sores on their toes, feet, or legs. These may take longer than normal to heal. DIAGNOSIS Your health care provider may diagnose PVD from your signs and symptoms. The health care provider will also do a physical exam. You may have tests to find out what is causing your PVD and determine its severity. Tests may include:  Blood pressure recordings from your arms and legs and measurements of the strength of your pulses (pulse volume recordings).  Imaging studies using sound waves to take pictures of   the blood flow through your blood vessels (Doppler ultrasound).  Injecting a dye into your blood vessels before having imaging studies using:  X-rays (angiogram or arteriogram).  Computer-generated X-rays (CT angiogram).  A powerful electromagnetic field and a computer (magnetic resonance angiogram or MRA). TREATMENT Treatment for PVD depends on the cause of your condition and the severity of your symptoms. It also depends on your age. Underlying  causes need to be treated and controlled. These include long-lasting (chronic) conditions, such as diabetes, high cholesterol, and high blood pressure. You may need to first try making lifestyle changes and taking medicines. Surgery may be needed if these do not work. Lifestyle changes may include:  Quitting smoking.  Exercising regularly.  Following a low-fat, low-cholesterol diet. Medicines may include:  Blood thinners to prevent blood clots.  Medicines to improve blood flow.  Medicines to improve your blood cholesterol levels. Surgical procedures may include:  A procedure that uses an inflated balloon to open a blocked artery and improve blood flow (angioplasty).  A procedure to put in a tube (stent) to keep a blocked artery open (stent implant).  Surgery to reroute blood flow around a blocked artery (peripheral bypass surgery).  Surgery to remove dead tissue from an infected wound on the affected limb.  Amputation. This is surgical removal of the affected limb. This may be necessary in cases of acute ischemic limb that are not improved through medical or surgical treatments. HOME CARE INSTRUCTIONS  Take medicines only as directed by your health care provider.  Do not use any tobacco products, including cigarettes, chewing tobacco, or electronic cigarettes. If you need help quitting, ask your health care provider.  Lose weight if you are overweight, and maintain a healthy weight as directed by your health care provider.  Eat a diet that is low in fat and cholesterol. If you need help, ask your health care provider.  Exercise regularly. Ask your health care provider to suggest some good activities for you.  Use compression stockings or other mechanical devices as directed by your health care provider.  Take good care of your feet.  Wear comfortable shoes that fit well.  Check your feet often for any cuts or sores. SEEK MEDICAL CARE IF:  You have cramps in your legs  while walking.  You have leg pain when you are at rest.  You have coldness in a leg or foot.  Your skin changes.  You have erectile dysfunction.  You have cuts or sores on your feet that are not healing. SEEK IMMEDIATE MEDICAL CARE IF:  Your arm or leg turns cold and blue.  Your arms or legs become red, warm, swollen, painful, or numb.  You have chest pain or trouble breathing.  You suddenly have weakness in your face, arm, or leg.  You become very confused or lose the ability to speak.  You suddenly have a very bad headache or lose your vision.   This information is not intended to replace advice given to you by your health care provider. Make sure you discuss any questions you have with your health care provider.   Document Released: 02/07/2004 Document Revised: 01/20/2014 Document Reviewed: 06/09/2013 Elsevier Interactive Patient Education 2016 [employer]  

## 2015-06-20 ENCOUNTER — Ambulatory Visit (INDEPENDENT_AMBULATORY_CARE_PROVIDER_SITE_OTHER): Payer: PPO | Admitting: Family Medicine

## 2015-06-20 ENCOUNTER — Ambulatory Visit (INDEPENDENT_AMBULATORY_CARE_PROVIDER_SITE_OTHER): Payer: PPO

## 2015-06-20 VITALS — BP 110/66 | HR 69 | Temp 98.8°F | Resp 16 | Ht 62.0 in | Wt 136.0 lb

## 2015-06-20 DIAGNOSIS — R0982 Postnasal drip: Secondary | ICD-10-CM | POA: Diagnosis not present

## 2015-06-20 DIAGNOSIS — R05 Cough: Secondary | ICD-10-CM | POA: Diagnosis not present

## 2015-06-20 DIAGNOSIS — K219 Gastro-esophageal reflux disease without esophagitis: Secondary | ICD-10-CM

## 2015-06-20 DIAGNOSIS — J988 Other specified respiratory disorders: Secondary | ICD-10-CM | POA: Diagnosis not present

## 2015-06-20 DIAGNOSIS — R059 Cough, unspecified: Secondary | ICD-10-CM

## 2015-06-20 DIAGNOSIS — J22 Unspecified acute lower respiratory infection: Secondary | ICD-10-CM

## 2015-06-20 MED ORDER — AZITHROMYCIN 250 MG PO TABS
ORAL_TABLET | ORAL | Status: DC
Start: 2015-06-20 — End: 2015-09-18

## 2015-06-20 NOTE — Patient Instructions (Addendum)
IF you received an x-ray today, you will receive an invoice from [facility]. Please contact [facility] at [phone] with questions or concerns regarding your invoice.   IF you received labwork today, you will receive an invoice from [employer]. Please contact [facility] at [phone] with questions or concerns regarding your invoice.   Our billing staff will not be able to assist you with questions regarding bills from these companies.  You will be contacted with the lab results as soon as they are available. The fastest way to get your results is to activate your My Chart account. Instructions are located on the last page of this paperwork. If you have not heard from [place] regarding the results in 2 weeks, please contact this office.     The radiologist did not see pneumonia, but there are some signs of possible bronchitis. Start azithromycin 2 pills today, one pill the next 4 days. You can try Mucinex or Mucinex DM to see if this helps with your cough, but I would also recommend increasing your Pepcid back up to twice per day for heartburn for now, as well as Claritin over-the-counter once per day in case some of this cough is coming from allergies. If fever, shortness of breath, or any worsening symptoms, return here or your primary care provider.  Cough, Adult Coughing is a reflex that clears your throat and your airways. Coughing helps to heal and protect your lungs. It is normal to cough occasionally, but a cough that happens with other symptoms or lasts a long time may be a sign of a condition that needs treatment. A cough may last only 2-3 weeks (acute), or it may last longer than 8 weeks (chronic). CAUSES Coughing is commonly caused by:  Breathing in substances that irritate your lungs.  A viral or bacterial respiratory infection.  Allergies.  Asthma.  Postnasal drip.  Smoking.  Acid backing up from the stomach into the  esophagus (gastroesophageal reflux).  Certain medicines.  Chronic lung problems, including COPD (or rarely, lung cancer).  Other medical conditions such as heart failure. HOME CARE INSTRUCTIONS  Pay attention to any changes in your symptoms. Take these actions to help with your discomfort:  Take medicines only as told by your health care provider.  If you were prescribed an antibiotic medicine, take it as told by your health care provider. Do not stop taking the antibiotic even if you start to feel better.  Talk with your health care provider before you take a cough suppressant medicine.  Drink enough fluid to keep your urine clear or pale yellow.  If the air is dry, use a cold steam vaporizer or humidifier in your bedroom or your home to help loosen secretions.  Avoid anything that causes you to cough at work or at home.  If your cough is worse at night, try sleeping in a semi-upright position.  Avoid cigarette smoke. If you smoke, quit smoking. If you need help quitting, ask your health care provider.  Avoid caffeine.  Avoid alcohol.  Rest as needed. SEEK MEDICAL CARE IF:   You have new symptoms.  You cough up pus.  Your cough does not get better after 2-3 weeks, or your cough gets worse.  You cannot control your cough with suppressant medicines and you are losing sleep.  You develop pain that is getting worse or pain that is not controlled with pain medicines.  You have a fever.  You have unexplained weight loss.  You have night sweats. SEEK IMMEDIATE MEDICAL CARE IF:  You cough up blood.  You have difficulty breathing.  Your heartbeat is very fast.   This information is not intended to replace advice given to you by your health care provider. Make sure you discuss any questions you have with your health care provider.   Document Released: 06/28/2010 Document Revised: 09/20/2014 Document Reviewed: 03/08/2014 Elsevier Interactive Patient Education 2016  [employer]  Acute Bronchitis Bronchitis is inflammation of the airways that extend from the windpipe into the lungs (bronchi). The inflammation often causes mucus to develop. This leads to a cough, which is the most common symptom of bronchitis.  In acute bronchitis, the condition usually develops suddenly and goes away over time, usually in a couple weeks. Smoking, allergies, and asthma can make bronchitis worse. Repeated episodes of bronchitis may cause further lung problems.  CAUSES Acute bronchitis is most often caused by the same virus that causes a cold. The virus can spread from person to person (contagious) through coughing, sneezing, and touching contaminated objects. SIGNS AND SYMPTOMS   Cough.   Fever.   Coughing up mucus.   Body aches.   Chest congestion.   Chills.   Shortness of breath.   Sore throat.  DIAGNOSIS  Acute bronchitis is usually diagnosed through a physical exam. Your health care provider will also ask you questions about your medical history. Tests, such as chest X-rays, are sometimes done to rule out other conditions.  TREATMENT  Acute bronchitis usually goes away in a couple weeks. Oftentimes, no medical treatment is necessary. Medicines are sometimes given for relief of fever or cough. Antibiotic medicines are usually not needed but may be prescribed in certain situations. In some cases, an inhaler may be recommended to help reduce shortness of breath and control the cough. A cool mist vaporizer may also be used to help thin bronchial secretions and make it easier to clear the chest.  HOME CARE INSTRUCTIONS  Get plenty of rest.   Drink enough fluids to keep your urine clear or pale yellow (unless you have a medical condition that requires fluid restriction). Increasing fluids may help thin your respiratory secretions (sputum) and reduce chest congestion, and it will prevent dehydration.   Take medicines only as directed by your health care  provider.  If you were prescribed an antibiotic medicine, finish it all even if you start to feel better.  Avoid smoking and secondhand smoke. Exposure to cigarette smoke or irritating chemicals will make bronchitis worse. If you are a smoker, consider using nicotine gum or skin patches to help control withdrawal symptoms. Quitting smoking will help your lungs heal faster.   Reduce the chances of another bout of acute bronchitis by washing your hands frequently, avoiding people with cold symptoms, and trying not to touch your hands to your mouth, nose, or eyes.   Keep all follow-up visits as directed by your health care provider.  SEEK MEDICAL CARE IF: Your symptoms do not improve after 1 week of treatment.  SEEK IMMEDIATE MEDICAL CARE IF:  You develop an increased fever or chills.   You have chest pain.   You have severe shortness of breath.  You have bloody sputum.   You develop dehydration.  You faint or repeatedly feel like you are going to pass out.  You develop repeated vomiting.  You develop a severe headache. MAKE SURE YOU:   Understand these instructions.  Will watch your condition.  Will get help right away if  you are not doing well or get worse.   This information is not intended to replace advice given to you by your health care provider. Make sure you discuss any questions you have with your health care provider.   Document Released: 02/07/2004 Document Revised: 01/20/2014 Document Reviewed: 06/22/2012 Elsevier Interactive Patient Education [employer].

## 2015-06-20 NOTE — Progress Notes (Signed)
By signing my name below I, Tereasa Coop, attest that this documentation has been prepared under the direction and in the presence of Wendie Agreste, MD. Electonically Signed. Tereasa Coop, Scribe 06/20/2015 at 9:22 AM   Subjective:    Patient ID: [name], female    DOB: 04-Aug-1940, 75 y.o.   MRN: [id number]  Chief Complaint  Patient presents with  . Cough    since April, productive cough    HPI [name] is a 75 y.o. female who presents to the [facility] complaining of persistent cough for the past 6 weeks. Cough started with a mild dry cough with chest congestion. Cough has worsened over the past week and become more productive with green and yellow phlegm for the past 3 days. Pt denies fever, SOB, orthopnea, paroxsymal nocturnal dyspne . Pt reports minor nasal congestion and post nasal drip. Pt denies history of smoking.  Pt seen yesterday by Vascular surgeon for follow up for peripheral vascular disease.   Pt's PCP is Florinda Marker, MD and her last visit Jun 06, 2015  Pt has history of:  Psoriasis and takes humira injections for treatment.  Chronic systolic heart failure, Pt's last echocardiogram was Dec 2016.   Chronic kidney disease  GERD Pt started taking half her usual dose of her PPI 2 weeks ago  DM  HTN  HLD  CAD s/p CABG  Patient Active Problem List   Diagnosis Date Noted  . Aftercare following surgery of the circulatory system 01/18/2015  . GERD (gastroesophageal reflux disease) 01/17/2015  . Left foot pain 05/30/2014  . Chronic kidney disease (CKD), stage III (moderate) 09/27/2013  . Shoulder pain, right 04/19/2013  . Preventative health care 12/29/2012  . Heart failure, chronic systolic ([place]) 64/40/3474  . Essential hypertension 09/09/2006  . 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 Dyslipidemia 10/15/2005  . 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/15/2005  . Peripheral vascular disease ([place]) 10/15/2005  .  Psoriasis 10/15/2005  . Osteopenia 10/15/2005  . Type 2 diabetes mellitus with stage 3 chronic kidney disease ([place]) 01/14/2004   Past Medical History  Diagnosis Date  . DM (diabetes mellitus) ([place])     insulin dependent  . Lumbar spinal stenosis   . History of colonic polyps   . Diverticulosis   . Nephrolithiasis     hx of  . HTN (hypertension)   . HLD (hyperlipidemia)   . PVD (peripheral vascular disease) (HCC)     ABI indicated moderated reduction arterial flow on the left.  Marland Kitchen CAD (coronary artery disease) 2006    s/p Quadrupal CABG 2006  . MI (myocardial infarction) ([place]) 2003    . [place]  . Tricuspid regurgitation 2009    moderate to severe, EF 55%  . Psoriasis   . OA (osteoarthritis)   . CKD (chronic kidney disease) stage 4, GFR 15-29 ml/min (HCC)     fluctuating between stage 3 and 4 depending on GFR  . Osteopenia 2010    T score of -1.8  . CHF (congestive heart failure) ([place]) 2010    EF 30-35% from Echo 06/2008  . MYOCARDIAL INFARCTION, HX OF 09/09/2006    Qualifier: Diagnosis of  By: Marinda Elk MD, Sonia Side    . DIVERTICULOSIS OF COLON 04/01/2007    Qualifier: Diagnosis of  By: Nelson-Smith CMA (AAMA), [name]    . NEPHROLITHIASIS 04/01/2007    Qualifier: Diagnosis of  By: Harlon Ditty CMA (AAMA), [name]    . Mouth  ulcer adjacent to jaw bone 08/22/2010    S/p removal by ENT 09/2010    Past Surgical History  Procedure Laterality Date  . Laparoscopic supracervical hysterectomy    . Coronary artery bypass graft  2006    4 vessel  . Coronary angioplasty with stent placement  2003  . Cataract extraction  2008    left eye  . Peripheral vascular catheterization N/A 07/03/2014    Procedure: Abdominal Aortogram w/Lower Extremity;  Surgeon: Angelia Mould, MD;  Location: [facility];  Service: Cardiovascular;  Laterality: N/A;   No Known Allergies Prior to Admission medications   Medication Sig Start Date End Date Taking? Authorizing Provider  ACCU-CHEK  FASTCLIX LANCETS MISC Check blood sugar 3 times a day 04/19/13  Yes [name], MD  amLODipine (NORVASC) 5 MG tablet Take 1 tablet (5 mg total) by mouth daily. 01/17/15 01/17/16 Yes Alexa Angela Burke, MD  aspirin EC 81 MG tablet Take 81 mg by mouth daily.   Yes Historical Provider, MD  atenolol (TENORMIN) 25 MG tablet Take 0.5 tablets (12.5 mg total) by mouth daily. 10/09/14 10/06/15 Yes [name], MD  betamethasone dipropionate 0.05 % lotion Apply topically 2 (two) times daily. 09/02/12  Yes [name], MD  Blood Glucose Monitoring Suppl (FREESTYLE LITE) DEVI Check blood sugar up to 4 times a day 11/06/14  Yes Oval Linsey, MD  Calcium Carb-Cholecalciferol (CALCIUM 600 + D PO) Take 1 tablet by mouth 2 (two) times daily.   Yes Historical Provider, MD  Clobetasol Prop Crea-Coal Tar 0.05 & 2.3 % KIT To apply twice daily on the affected area. 09/02/12  Yes [name], MD  clopidogrel (PLAVIX) 75 MG tablet Take 1 tablet (75 mg total) by mouth daily. 08/02/14  Yes Angelia Mould, MD  diclofenac sodium (VOLTAREN) 1 % GEL Apply 2 g topically 4 (four) times daily. 12/04/14  Yes Alexa Angela Burke, MD  famotidine (PEPCID) 20 MG tablet Take 1 tablet (20 mg total) by mouth 2 (two) times daily. 01/17/15  Yes Alexa Angela Burke, MD  furosemide (LASIX) 40 MG tablet Take 1 tablet (40 mg total) by mouth daily. 01/04/15  Yes Alexa Angela Burke, MD  glucose blood (FREESTYLE LITE) test strip Check blood sugar 4 times a day 11/06/14  Yes Oval Linsey, MD  Insulin Glargine (LANTUS SOLOSTAR) 100 UNIT/ML Solostar Pen Inject 44 Units into the skin daily at 10 pm. 09/01/14  Yes Annia Belt, MD  Insulin Pen Needle 32G X 4 MM MISC Use with insulin pen once daily. diag code E11.8 insulin dependent 10/11/14  Yes [name], MD  insulin regular (NOVOLIN R) 100 units/mL injection INJECT 14 UNITS BEFORE BREAKFAST, 10 UNITS BEFORE LUNCH AND 14 UNITS BEFORE DINNER 06/06/15  Yes Alexa Angela Burke, MD  Insulin Syringe-Needle  U-100 31G X 15/64" 0.3 ML MISC Use to inject mealtime insulin 3 times a day Dx code 250.00 insulin requiring 01/04/15  Yes Alexa Angela Burke, MD  LANCETS ULTRA FINE MISC Check blood sugar 4 times a day 11/06/14  Yes Oval Linsey, MD  [facility] DELICA LANCETS 72Y [place] DAILY 05/26/14  Yes [name], MD  potassium chloride (K-DUR) 10 MEQ tablet Take 1 tablet (10 mEq total) by mouth 2 (two) times daily. 01/17/15  Yes Alexa Angela Burke, MD  rosuvastatin (CRESTOR) 20 MG tablet Take 1 tablet (20 mg total) by mouth daily. 06/06/15  Yes Alexa Angela Burke, MD  traMADol (ULTRAM) 50 MG tablet Take  1 tablet (50 mg total) by mouth daily as needed for severe pain. 01/17/15  Yes Alexa Angela Burke, MD  valsartan (DIOVAN) 320 MG tablet Take 1 tablet (320 mg total) by mouth daily. 11/06/14  Yes Oval Linsey, MD   Social History   Social History  . Marital Status: Widowed    Spouse Name: N/A  . Number of Children: N/A  . Years of Education: N/A   Occupational History  . Not on file.   Social History Main Topics  . Smoking status: Never Smoker   . Smokeless tobacco: Not on file  . Alcohol Use: No  . Drug Use: No  . Sexual Activity: Not on file   Other Topics Concern  . Not on file   Social History Narrative   Retired Event organiser   Widow/Widower   lives in [place]   4 kids (2 sons, 2 daughters) one son died many years ago in car accident.  other kids are all in [place].   Has friend that helps take care of her      Review of Systems  Constitutional: Negative for fever.  HENT: Positive for congestion and postnasal drip.   Respiratory: Positive for cough. Negative for shortness of breath.   Psychiatric/Behavioral: Negative for sleep disturbance.       Objective:   Physical Exam  Constitutional: She is oriented to person, place, and time. She appears well-developed and well-nourished. No distress.  HENT:  Head: Normocephalic and atraumatic.  Right Ear:  Hearing, tympanic membrane, external ear and ear canal normal.  Left Ear: Hearing, tympanic membrane, external ear and ear canal normal.  Nose: Nose normal.  Mouth/Throat: Oropharynx is clear and moist. No oropharyngeal exudate.  Eyes: Conjunctivae and EOM are normal. Pupils are equal, round, and reactive to light.  Cardiovascular: Normal rate, regular rhythm, normal heart sounds and intact distal pulses.  Exam reveals no gallop and no friction rub.   No murmur heard. Pulmonary/Chest: Effort normal and breath sounds normal. No respiratory distress. She has no decreased breath sounds. She has no wheezes. She has no rhonchi. She has no rales.  Musculoskeletal:       Right lower leg: She exhibits no tenderness and no edema.       Left lower leg: She exhibits no tenderness and no edema.  Neurological: She is alert and oriented to person, place, and time.  Skin: Skin is warm and dry. No rash noted.  Psychiatric: She has a normal mood and affect. Her behavior is normal.  Vitals reviewed.    Filed Vitals:   06/20/15 0822  BP: 110/66  Pulse: 69  Temp: 98.8 F (37.1 C)  TempSrc: Oral  Resp: 16  Height: 5' 2"  (1.575 m)  Weight: 136 lb (61.689 kg)  SpO2: 95%   Dg Chest 2 View  06/20/2015  CLINICAL DATA:  Cough for 7 weeks, worse recently EXAM: CHEST  2 VIEW COMPARISON:  Chest x-ray of 09/04/2010 FINDINGS: No pneumonia or effusion is seen. There is mild peribronchial thickening which may indicate bronchitis. An apparent granuloma is noted within the superior segment of the right lower lobe medially and is unchanged. Mild cardiomegaly is stable. Median sternotomy sutures are noted from prior CABG. There are degenerative changes throughout the mid to lower thoracic spine. IMPRESSION: No pneumonia or effusion.  Question bronchitis. Electronically Signed   By: Ivar Drape M.D.   On: 06/20/2015 08:54      Assessment & Plan:   [name]  is a 75 y.o. female Cough - Plan: DG Chest 2 View,  azithromycin (ZITHROMAX) 250 MG tablet, Care order/instruction  LRTI (lower respiratory tract infection) - Plan: azithromycin (ZITHROMAX) 250 MG tablet  Gastroesophageal reflux disease, esophagitis presence not specified  PND (post-nasal drip)   Possible multifactorial cough for possibly 78 weeks. Now with recent worsening past week. Early bronchitis, start Z-Pak. Underlying reflux, allergies possible. Increase Pepcid to twice a day, Claritin over-the-counter once per day as needed. Mucinex or Mucinex DM as needed. RTC precautions discussed.   Meds ordered this encounter  . azithromycin (ZITHROMAX) 250 MG tablet    Sig: Take 2 pills by mouth on day 1, then 1 pill by mouth per day on days 2 through 5.    Dispense:  6 tablet    Refill:  0   Patient Instructions       IF you received an x-ray today, you will receive an invoice from [facility]. Please contact [facility] at [phone] with questions or concerns regarding your invoice.   IF you received labwork today, you will receive an invoice from [employer]. Please contact [facility] at [phone] with questions or concerns regarding your invoice.   Our billing staff will not be able to assist you with questions regarding bills from these companies.  You will be contacted with the lab results as soon as they are available. The fastest way to get your results is to activate your My Chart account. Instructions are located on the last page of this paperwork. If you have not heard from [place] regarding the results in 2 weeks, please contact this office.     The radiologist did not see pneumonia, but there are some signs of possible bronchitis. Start azithromycin 2 pills today, one pill the next 4 days. You can try Mucinex or Mucinex DM to see if this helps with your cough, but I would also recommend increasing your Pepcid back up to twice per day for heartburn for now, as well as Claritin  over-the-counter once per day in case some of this cough is coming from allergies. If fever, shortness of breath, or any worsening symptoms, return here or your primary care provider.  Cough, Adult Coughing is a reflex that clears your throat and your airways. Coughing helps to heal and protect your lungs. It is normal to cough occasionally, but a cough that happens with other symptoms or lasts a long time may be a sign of a condition that needs treatment. A cough may last only 2-3 weeks (acute), or it may last longer than 8 weeks (chronic). CAUSES Coughing is commonly caused by:  Breathing in substances that irritate your lungs.  A viral or bacterial respiratory infection.  Allergies.  Asthma.  Postnasal drip.  Smoking.  Acid backing up from the stomach into the esophagus (gastroesophageal reflux).  Certain medicines.  Chronic lung problems, including COPD (or rarely, lung cancer).  Other medical conditions such as heart failure. HOME CARE INSTRUCTIONS  Pay attention to any changes in your symptoms. Take these actions to help with your discomfort:  Take medicines only as told by your health care provider.  If you were prescribed an antibiotic medicine, take it as told by your health care provider. Do not stop taking the antibiotic even if you start to feel better.  Talk with your health care provider before you take a cough suppressant medicine.  Drink enough fluid to keep your urine clear or pale yellow.  If the air  is dry, use a cold steam vaporizer or humidifier in your bedroom or your home to help loosen secretions.  Avoid anything that causes you to cough at work or at home.  If your cough is worse at night, try sleeping in a semi-upright position.  Avoid cigarette smoke. If you smoke, quit smoking. If you need help quitting, ask your health care provider.  Avoid caffeine.  Avoid alcohol.  Rest as needed. SEEK MEDICAL CARE IF:   You have new symptoms.  You  cough up pus.  Your cough does not get better after 2-3 weeks, or your cough gets worse.  You cannot control your cough with suppressant medicines and you are losing sleep.  You develop pain that is getting worse or pain that is not controlled with pain medicines.  You have a fever.  You have unexplained weight loss.  You have night sweats. SEEK IMMEDIATE MEDICAL CARE IF:  You cough up blood.  You have difficulty breathing.  Your heartbeat is very fast.   This information is not intended to replace advice given to you by your health care provider. Make sure you discuss any questions you have with your health care provider.   Document Released: 06/28/2010 Document Revised: 09/20/2014 Document Reviewed: 03/08/2014 Elsevier Interactive Patient Education 2016 Elsevier Inc.  Acute Bronchitis Bronchitis is inflammation of the airways that extend from the windpipe into the lungs (bronchi). The inflammation often causes mucus to develop. This leads to a cough, which is the most common symptom of bronchitis.  In acute bronchitis, the condition usually develops suddenly and goes away over time, usually in a couple weeks. Smoking, allergies, and asthma can make bronchitis worse. Repeated episodes of bronchitis may cause further lung problems.  CAUSES Acute bronchitis is most often caused by the same virus that causes a cold. The virus can spread from person to person (contagious) through coughing, sneezing, and touching contaminated objects. SIGNS AND SYMPTOMS   Cough.   Fever.   Coughing up mucus.   Body aches.   Chest congestion.   Chills.   Shortness of breath.   Sore throat.  DIAGNOSIS  Acute bronchitis is usually diagnosed through a physical exam. Your health care provider will also ask you questions about your medical history. Tests, such as chest X-rays, are sometimes done to rule out other conditions.  TREATMENT  Acute bronchitis usually goes away in a couple  weeks. Oftentimes, no medical treatment is necessary. Medicines are sometimes given for relief of fever or cough. Antibiotic medicines are usually not needed but may be prescribed in certain situations. In some cases, an inhaler may be recommended to help reduce shortness of breath and control the cough. A cool mist vaporizer may also be used to help thin bronchial secretions and make it easier to clear the chest.  HOME CARE INSTRUCTIONS  Get plenty of rest.   Drink enough fluids to keep your urine clear or pale yellow (unless you have a medical condition that requires fluid restriction). Increasing fluids may help thin your respiratory secretions (sputum) and reduce chest congestion, and it will prevent dehydration.   Take medicines only as directed by your health care provider.  If you were prescribed an antibiotic medicine, finish it all even if you start to feel better.  Avoid smoking and secondhand smoke. Exposure to cigarette smoke or irritating chemicals will make bronchitis worse. If you are a smoker, consider using nicotine gum or skin patches to help control withdrawal symptoms. Quitting smoking will help  your lungs heal faster.   Reduce the chances of another bout of acute bronchitis by washing your hands frequently, avoiding people with cold symptoms, and trying not to touch your hands to your mouth, nose, or eyes.   Keep all follow-up visits as directed by your health care provider.  SEEK MEDICAL CARE IF: Your symptoms do not improve after 1 week of treatment.  SEEK IMMEDIATE MEDICAL CARE IF:  You develop an increased fever or chills.   You have chest pain.   You have severe shortness of breath.  You have bloody sputum.   You develop dehydration.  You faint or repeatedly feel like you are going to pass out.  You develop repeated vomiting.  You develop a severe headache. MAKE SURE YOU:   Understand these instructions.  Will watch your condition.  Will get  help right away if you are not doing well or get worse.   This information is not intended to replace advice given to you by your health care provider. Make sure you discuss any questions you have with your health care provider.   Document Released: 02/07/2004 Document Revised: 01/20/2014 Document Reviewed: 06/22/2012 Elsevier Interactive Patient Education [employer].      I personally performed the services described in this documentation, which was scribed in my presence. The recorded information has been reviewed and considered, and addended by me as needed.   Signed,   Merri Ray, MD [facility].  06/20/2015 9:31 AM

## 2015-07-26 ENCOUNTER — Encounter: Payer: Self-pay | Admitting: Internal Medicine

## 2015-07-26 ENCOUNTER — Ambulatory Visit (INDEPENDENT_AMBULATORY_CARE_PROVIDER_SITE_OTHER): Payer: PPO | Admitting: Internal Medicine

## 2015-07-26 VITALS — BP 130/71 | HR 58 | Temp 98.0°F | Ht 61.5 in | Wt 140.9 lb

## 2015-07-26 DIAGNOSIS — R05 Cough: Secondary | ICD-10-CM | POA: Insufficient documentation

## 2015-07-26 DIAGNOSIS — R053 Chronic cough: Secondary | ICD-10-CM | POA: Insufficient documentation

## 2015-07-26 NOTE — Assessment & Plan Note (Signed)
Pt is here for persistent cough for at least 7 weeks. She says she went to urgent care last month, and they gave her azithromycin, as well as increased the pepcid to BID. A CXR was negative for pneumonia but did show some bronchitis.   Since then, her cough has persisted. Her cough is worse at night time and improves during the day time. It is mainly dry but occasionally she has yellow sputum production. She denies smoking. No new medicines. Has no pets at home.  She denies any rhinorrhea, sinus tenderness. Does have some sore throat. Denies fevers, or other constitutional symptoms. She is on Humira for the last 6 months for her psoriasis. She says her GERD is well controlled on her pepcid  On exam, oropharynx was without any exudates or erythema. No lymphadenopathy. No sinus tenderness.   Most likely this is ARB induced cough. Could be her GERD. Unlikely to be upper airway cough syndrome or any reactive airway disease.   -Asked to stop the valsartan and RTC in 4 weeks if her cough does not improve. At that time, we can step up the GERD to a PPI, and consider doing PFTs . If her cough worsens, then we may have to look for other etiologies, as patient is on an immunosuppressant.

## 2015-07-26 NOTE — Progress Notes (Signed)
Patient ID: [name], female   DOB: [birth year], 75 y.o.   MRN: [id number]    CC: chronic cough HPI: [name] is a 75 y.o. woman with PMH noted below who is here for persistent cough   Please see Problem List/A&P for the status of the patient's chronic medical problems   Past Medical History  Diagnosis Date  . DM (diabetes mellitus) ([place])     insulin dependent  . Lumbar spinal stenosis   . History of colonic polyps   . Diverticulosis   . Nephrolithiasis     hx of  . HTN (hypertension)   . HLD (hyperlipidemia)   . PVD (peripheral vascular disease) (HCC)     ABI indicated moderated reduction arterial flow on the left.  Marland Kitchen CAD (coronary artery disease) 2006    s/p Quadrupal CABG 2006  . MI (myocardial infarction) ([place]) 2003    . [place]  . Tricuspid regurgitation 2009    moderate to severe, EF 55%  . Psoriasis   . OA (osteoarthritis)   . CKD (chronic kidney disease) stage 4, GFR 15-29 ml/min (HCC)     fluctuating between stage 3 and 4 depending on GFR  . Osteopenia 2010    T score of -1.8  . CHF (congestive heart failure) ([place]) 2010    EF 30-35% from Echo 06/2008  . MYOCARDIAL INFARCTION, HX OF 09/09/2006    Qualifier: Diagnosis of  By: Marinda Elk MD, Sonia Side    . DIVERTICULOSIS OF COLON 04/01/2007    Qualifier: Diagnosis of  By: Nelson-Smith CMA (AAMA), [name]    . NEPHROLITHIASIS 04/01/2007    Qualifier: Diagnosis of  By: Harlon Ditty CMA (AAMA), [name]    . Mouth ulcer adjacent to jaw bone 08/22/2010    S/p removal by ENT 09/2010     Review of Systems:  Negative except as per HPI  Physical Exam: Filed Vitals:   07/26/15 1542  BP: 130/71  Pulse: 58  Temp: 98 F (36.7 C)  TempSrc: Oral  Height: 5' 1.5" (1.562 m)  Weight: 140 lb 14.4 oz (63.912 kg)  SpO2: 96%    General: A&O, in NAD HEENT: MMM, no pharyngeal exudates or erythema   Neck: supple, midline trachea, no cervical lymphadenopathy  CV: RRR, normal s1, s2, no m/r/g Resp: equal and  symmetric breath sounds, no wheezing or crackles  Abdomen: soft, nontender, nondistended, +BS Skin: warm, dry, intact   Assessment & Plan:   See encounters tab for problem based medical decision making. Patient discussed with Dr. Evette Doffing

## 2015-07-26 NOTE — Patient Instructions (Signed)
Thank you for your visit today Please stop taking the valsartan (Diovan)- Likely that may be causing the cough. Please return to clinic if your symptoms do not improve in 4 weeks

## 2015-07-31 NOTE — Progress Notes (Signed)
Internal Medicine Clinic Attending  Case discussed with Dr. Saraiya at the time of the visit.  We reviewed the resident's history and exam and pertinent patient test results.  I agree with the assessment, diagnosis, and plan of care documented in the resident's note.  

## 2015-08-14 ENCOUNTER — Other Ambulatory Visit: Payer: Self-pay | Admitting: *Deleted

## 2015-08-14 DIAGNOSIS — I739 Peripheral vascular disease, unspecified: Secondary | ICD-10-CM

## 2015-09-05 ENCOUNTER — Encounter: Payer: PPO | Admitting: Internal Medicine

## 2015-09-18 ENCOUNTER — Other Ambulatory Visit: Payer: Self-pay | Admitting: Internal Medicine

## 2015-09-19 ENCOUNTER — Ambulatory Visit (INDEPENDENT_AMBULATORY_CARE_PROVIDER_SITE_OTHER): Payer: PPO | Admitting: Internal Medicine

## 2015-09-19 ENCOUNTER — Encounter: Payer: Self-pay | Admitting: Internal Medicine

## 2015-09-19 VITALS — BP 156/60 | HR 58 | Temp 98.6°F | Wt 140.7 lb

## 2015-09-19 DIAGNOSIS — E1122 Type 2 diabetes mellitus with diabetic chronic kidney disease: Secondary | ICD-10-CM

## 2015-09-19 DIAGNOSIS — Z79899 Other long term (current) drug therapy: Secondary | ICD-10-CM

## 2015-09-19 DIAGNOSIS — M7661 Achilles tendinitis, right leg: Secondary | ICD-10-CM

## 2015-09-19 DIAGNOSIS — Z23 Encounter for immunization: Secondary | ICD-10-CM | POA: Diagnosis not present

## 2015-09-19 DIAGNOSIS — E1165 Type 2 diabetes mellitus with hyperglycemia: Secondary | ICD-10-CM | POA: Diagnosis not present

## 2015-09-19 DIAGNOSIS — Z0000 Encounter for general adult medical examination without abnormal findings: Secondary | ICD-10-CM

## 2015-09-19 DIAGNOSIS — Z794 Long term (current) use of insulin: Secondary | ICD-10-CM

## 2015-09-19 DIAGNOSIS — N183 Chronic kidney disease, stage 3 (moderate): Secondary | ICD-10-CM

## 2015-09-19 DIAGNOSIS — I10 Essential (primary) hypertension: Secondary | ICD-10-CM

## 2015-09-19 DIAGNOSIS — Z7982 Long term (current) use of aspirin: Secondary | ICD-10-CM

## 2015-09-19 DIAGNOSIS — I129 Hypertensive chronic kidney disease with stage 1 through stage 4 chronic kidney disease, or unspecified chronic kidney disease: Secondary | ICD-10-CM

## 2015-09-19 DIAGNOSIS — M8580 Other specified disorders of bone density and structure, unspecified site: Secondary | ICD-10-CM

## 2015-09-19 DIAGNOSIS — Z951 Presence of aortocoronary bypass graft: Secondary | ICD-10-CM

## 2015-09-19 DIAGNOSIS — M25571 Pain in right ankle and joints of right foot: Secondary | ICD-10-CM

## 2015-09-19 DIAGNOSIS — M7660 Achilles tendinitis, unspecified leg: Secondary | ICD-10-CM | POA: Insufficient documentation

## 2015-09-19 LAB — POCT GLYCOSYLATED HEMOGLOBIN (HGB A1C): HEMOGLOBIN A1C: 8.1

## 2015-09-19 LAB — GLUCOSE, CAPILLARY: GLUCOSE-CAPILLARY: 366 mg/dL — AB (ref 65–99)

## 2015-09-19 MED ORDER — GLIPIZIDE 5 MG PO TABS: 5.0000 mg | ORAL_TABLET | Freq: Every day | ORAL | 1 refills | Status: DC

## 2015-09-19 MED ORDER — AMLODIPINE BESYLATE 10 MG PO TABS: 5.0000 mg | ORAL_TABLET | Freq: Every day | ORAL | 3 refills | Status: DC

## 2015-09-19 MED ORDER — DICLOFENAC SODIUM 1 % TD GEL: 2.0000 g | Freq: Four times a day (QID) | TRANSDERMAL | 0 refills | Status: DC

## 2015-09-19 MED ORDER — ROSUVASTATIN CALCIUM 20 MG PO TABS: 20.0000 mg | ORAL_TABLET | Freq: Every day | ORAL | 3 refills | Status: DC

## 2015-09-19 NOTE — Assessment & Plan Note (Signed)
BP Readings from Last 3 Encounters:  09/19/15 (!) 156/60  07/26/15 130/71  06/20/15 110/66    Lab Results  Component Value Date   NA 144 06/06/2015   K 4.1 06/06/2015   CREATININE 1.57 (H) 06/06/2015    Assessment: Blood pressure control:  Uncontrolled Progress toward BP goal:   Deteriorated Comments: Valsartan 320 mg daily discontinued in July due to chronic cough. Cough has now resolved.   Plan: Medications:  Increase amlodipine to 10 mg daily. Continue atenolol 12.5 mg QD (HR 58).  Other plans: Consider addition of hydralazine or terazosin at follow up if BP remains elevated.

## 2015-09-19 NOTE — Assessment & Plan Note (Signed)
Mammogram: N 12/2014 Flu: Received 09/19/15 DEXA- Will repeat

## 2015-09-19 NOTE — Progress Notes (Signed)
    CC: follow up for HTN  HPI: [name] is a 75 y.o. female with PMHx of HTN, T2DM, and CKD III who presents to the clinic for follow up for HTN and T2DM. Please see problem based assessment and plan for more information.   Patient is eating and drinking well. She denies chest pain or shortness of breath.   Past Medical History:  Diagnosis Date  . CAD (coronary artery disease) 2006   s/p Quadrupal CABG 2006  . CHF (congestive heart failure) ([name]) 2010   EF 30-35% from Echo 06/2008  . CKD (chronic kidney disease) stage 4, GFR 15-29 ml/min (HCC)    fluctuating between stage 3 and 4 depending on GFR  . Diverticulosis   . DIVERTICULOSIS OF COLON 04/01/2007   Qualifier: Diagnosis of  By: [name] ([name]), [name]    . DM (diabetes mellitus) ([name])    insulin dependent  . History of colonic polyps   . HLD (hyperlipidemia)   . HTN (hypertension)   . Lumbar spinal stenosis   . MI (myocardial infarction) ([name]) 2003   . [place]  . Mouth ulcer adjacent to jaw bone 08/22/2010   S/p removal by ENT 09/2010   . MYOCARDIAL INFARCTION, HX OF 09/09/2006   Qualifier: Diagnosis of  By: [name], [name]    . Nephrolithiasis    hx of  . NEPHROLITHIASIS 04/01/2007   Qualifier: Diagnosis of  By: [name] ([name]), [name]    . OA (osteoarthritis)   . Osteopenia 2010   T score of -1.8  . Psoriasis   . PVD (peripheral vascular disease) (HCC)    ABI indicated moderated reduction arterial flow on the left.  . Tricuspid regurgitation 2009   moderate to severe, EF 55%   Review of Systems: Please see pertinent ROS reviewed in HPI and problem based charting.   Physical Exam: Vitals:   09/19/15 1324 09/19/15 1402 09/19/15 1407  BP: (!) 164/60 (!) 160/67 (!) 156/60  Pulse:  (!) 57 (!) 58  Temp: 98.6 F (37 C)    TempSrc: Oral    SpO2: 97%    Weight: 140 lb 11.2 oz (63.8 kg)     General: Vital signs reviewed.  Patient is elderly, in no acute distress and cooperative  with exam.  Neck: Supple, trachea midline, no carotid bruit present.  Cardiovascular: RRR, S1 normal, S2 normal, no murmurs, gallops, or rubs. Pulmonary/Chest: Clear to auscultation bilaterally, no wheezes, rales, or rhonchi. Abdominal: Soft, non-tender, non-distended, BS + Extremities: Chronic 1+ pitting edema in right foot and ankle, pulses symmetric and intact bilaterally. No tenderness on palpation of right achilles tendon.  Psychiatric: Normal mood and affect. speech and behavior is normal. Cognition and memory are grossly normal.   Assessment & Plan:  See encounters tab for problem based medical decision making. Patient discussed with Dr. Eppie Gibson.

## 2015-09-19 NOTE — Assessment & Plan Note (Signed)
Patient denies chest pain or shortness of breath. No anginal symptoms. She is compliant with plavix, ASA, rosuvastatin 20 mg daily, and atenolol. No ACEI/ARB due to cough.   Plan: -Continue present management

## 2015-09-19 NOTE — Assessment & Plan Note (Signed)
Patient admits to pain her her posterior right ankle localized in the achilles tendon for the past one week. Pain is worse with ambulation and better at rest. Tendon is non-tender on palpation. Patient has chronic 1+ edema in her right ankle. No increased warmth or erythema. She has not tried anything for this pain. Likely from achilles tendinitis or calcaneal spur.   Plan: -Voltaren Gel QID -Supportive care

## 2015-09-19 NOTE — Assessment & Plan Note (Signed)
Last DEXA in 2010 showed osteopenia. We will plan to repeat DEXA.  Plan: -DEXA

## 2015-09-19 NOTE — Assessment & Plan Note (Addendum)
Lab Results  Component Value Date   HGBA1C 8.1 09/19/2015   HGBA1C 6.9 06/06/2015   HGBA1C 7.5 02/28/2015     Assessment: Diabetes control:  Uncontrolled Progress toward A1C goal:   Above goal Comments: Patient is currently on Lantus 44 units QHS, and Novolog 14 units, 10 units and 14 units. Metformin discontinued last visit due to CKD with GFR near 30. No recurrent hypoglycemia on current regimen, but frequent post-prandial hyperglycemic episodes.   Plan: Medications:  Continue current insulin regimen. Add Glipizide 5 mg daily.  Home glucose monitoring: Frequency:  TID Timing:  ACHS Instruction/counseling given: reminded to bring blood glucose meter & log to each visit and reminded to bring medications to each visit Other plans: Follow up in 3 months

## 2015-09-19 NOTE — Addendum Note (Signed)
Addended by: Martyn Malay R on: 09/19/2015 04:47 PM   Modules accepted: Orders

## 2015-09-19 NOTE — Patient Instructions (Signed)
[name],  For your blood pressure, continue to take atenolol 12.5 mg once a day and increase your amlodipine to 10 mg once a day. You can take 2 of your 5 mg pills until you receive the new prescription.   For your diabetes, Continue to take Lantus 44 units at night.  Take Novolog 14 units in the morning, 10 units before lunch, and 14 units before dinner.  I have added a new pill called glipizide. Please take one pill each morning.   We will order you a DEXA scan for osteoporosis.   Please follow up in 3 months.

## 2015-09-20 NOTE — Progress Notes (Signed)
Case discussed with Dr. Burns soon after the resident saw the patient.  We reviewed the resident's history and exam and pertinent patient test results.  I agree with the assessment, diagnosis and plan of care documented in the resident's note. 

## 2015-09-26 ENCOUNTER — Other Ambulatory Visit: Payer: Self-pay | Admitting: Oncology

## 2015-09-26 DIAGNOSIS — E118 Type 2 diabetes mellitus with unspecified complications: Secondary | ICD-10-CM

## 2015-09-28 LAB — GLUCOSE, POCT (MANUAL RESULT ENTRY): POC GLUCOSE: 303 mg/dL — AB (ref 70–99)

## 2015-11-03 ENCOUNTER — Other Ambulatory Visit: Payer: Self-pay | Admitting: Internal Medicine

## 2015-11-03 DIAGNOSIS — E118 Type 2 diabetes mellitus with unspecified complications: Secondary | ICD-10-CM

## 2015-11-08 ENCOUNTER — Other Ambulatory Visit: Payer: Self-pay | Admitting: Internal Medicine

## 2015-11-08 DIAGNOSIS — E1122 Type 2 diabetes mellitus with diabetic chronic kidney disease: Secondary | ICD-10-CM

## 2015-11-08 DIAGNOSIS — Z794 Long term (current) use of insulin: Principal | ICD-10-CM

## 2015-11-08 DIAGNOSIS — N183 Chronic kidney disease, stage 3 unspecified: Secondary | ICD-10-CM

## 2015-11-12 ENCOUNTER — Telehealth: Payer: Self-pay | Admitting: Internal Medicine

## 2015-11-12 NOTE — Telephone Encounter (Signed)
APT. REMINDER CALL, LMTCB °

## 2015-11-13 ENCOUNTER — Ambulatory Visit (INDEPENDENT_AMBULATORY_CARE_PROVIDER_SITE_OTHER): Payer: PPO | Admitting: Internal Medicine

## 2015-11-13 VITALS — BP 147/60 | HR 57 | Temp 97.9°F | Wt 139.3 lb

## 2015-11-13 DIAGNOSIS — M654 Radial styloid tenosynovitis [de Quervain]: Secondary | ICD-10-CM | POA: Diagnosis not present

## 2015-11-13 HISTORY — DX: Radial styloid tenosynovitis (de quervain): M65.4

## 2015-11-13 NOTE — Patient Instructions (Addendum)
[name] it was nice meeting you today.  -Apply Voltaren gel 4 times a day to your wrist and shoulder   -You may also take Tylenol as needed  -Wear a forearm-based thumb spica splint   -Return for a follow-up visit in 2-3 weeks    De Quervain Tenosynovitis Tendons attach muscles to bones. They also help with joint movements. When tendons become irritated or swollen, it is called tendinitis. The extensor pollicis brevis (EPB) tendon connects the EPB muscle to a bone that is near the base of the thumb. The EPB muscle helps to straighten and extend the thumb. De Quervain tenosynovitis is a condition in which the EPB tendon lining (sheath) becomes irritated, thickened, and swollen. This condition is sometimes called stenosing tenosynovitis. This condition causes pain on the thumb side of the back of the wrist. CAUSES Causes of this condition include:  Activities that repeatedly cause your thumb and wrist to extend.  A sudden increase in activity or change in activity that affects your wrist. RISK FACTORS: This condition is more likely to develop in:  Females.  People who have diabetes.  Women who have recently given birth.  People who are over 33 years of age.  People who do activities that involve repeated hand and wrist motions, such as tennis, racquetball, volleyball, gardening, and taking care of children.  People who do heavy labor.  People who have poor wrist strength and flexibility.  People who do not warm up properly before activities. SYMPTOMS Symptoms of this condition include:  Pain or tenderness over the thumb side of the back of the wrist when your thumb and wrist are not moving.  Pain that gets worse when you straighten your thumb or extend your thumb or wrist.  Pain when the injured area is touched.  Locking or catching of the thumb joint while you bend and straighten your thumb.  Decreased thumb motion due to pain.  Swelling over the affected  area. DIAGNOSIS This condition is diagnosed with a medical history and physical exam. Your health care provider will ask for details about your injury and ask about your symptoms. TREATMENT Treatment may include the use of icing and medicines to reduce pain and swelling. You may also be advised to wear a splint or brace to limit your thumb and wrist motion. In less severe cases, treatment may also include working with a physical therapist to strengthen your wrist and calm the irritation around your EPB tendon sheath. In severe cases, surgery may be needed. HOME CARE INSTRUCTIONS If You Have a Splint or Brace:  Wear it as told by your health care provider. Remove it only as told by your health care provider.  Loosen the splint or brace if your fingers become numb and tingle, or if they turn cold and blue.  Keep the splint or brace clean and dry. Managing Pain, Stiffness, and Swelling   If directed, apply ice to the injured area.  Put ice in a plastic bag.  Place a towel between your skin and the bag.  Leave the ice on for 20 minutes, 2-3 times per day.  Move your fingers often to avoid stiffness and to lessen swelling.  Raise (elevate) the injured area above the level of your heart while you are sitting or lying down. General Instructions  Return to your normal activities as told by your health care provider. Ask your health care provider what activities are safe for you.  Take over-the-counter and prescription medicines only as told  by your health care provider.  Keep all follow-up visits as told by your health care provider. This is important.  Do not drive or operate heavy machinery while taking prescription pain medicine. SEEK MEDICAL CARE IF:  Your pain, tenderness, or swelling gets worse, even if you have had treatment.  You have numbness or tingling in your wrist, hand, or fingers on the injured side.   This information is not intended to replace advice given to you by  your health care provider. Make sure you discuss any questions you have with your health care provider.   Document Released: 12/30/2004 Document Revised: 09/20/2014 Document Reviewed: 03/07/2014 Elsevier Interactive Patient Education [employer].

## 2015-11-15 NOTE — Progress Notes (Signed)
   CC: Pt is c/o pain in her L wrist.   HPI:  [name] is a 75 y.o. F with a PMHx of conditions listed below presenting to the clinic to discuss pain in her L wrist. Please see problem based charting for the status of the patient's current and chronic medical conditions.   Past Medical History:  Diagnosis Date  . CAD (coronary artery disease) 2006   s/p Quadrupal CABG 2006  . CHF (congestive heart failure) ([place]) 2010   EF 30-35% from Echo 06/2008  . CKD (chronic kidney disease) stage 4, GFR 15-29 ml/min (HCC)    fluctuating between stage 3 and 4 depending on GFR  . Diverticulosis   . DIVERTICULOSIS OF COLON 04/01/2007   Qualifier: Diagnosis of  By: Nelson-Smith CMA (AAMA), [name]    . DM (diabetes mellitus) ([place])    insulin dependent  . History of colonic polyps   . HLD (hyperlipidemia)   . HTN (hypertension)   . Lumbar spinal stenosis   . MI (myocardial infarction) 2003   . [place]  . Mouth ulcer adjacent to jaw bone 08/22/2010   S/p removal by ENT 09/2010   . MYOCARDIAL INFARCTION, HX OF 09/09/2006   Qualifier: Diagnosis of  By: Marinda Elk MD, Sonia Side    . Nephrolithiasis    hx of  . NEPHROLITHIASIS 04/01/2007   Qualifier: Diagnosis of  By: Harlon Ditty CMA (AAMA), [name]    . OA (osteoarthritis)   . Osteopenia 2010   T score of -1.8  . Psoriasis   . PVD (peripheral vascular disease) (HCC)    ABI indicated moderated reduction arterial flow on the left.  . Tricuspid regurgitation 2009   moderate to severe, EF 55%    Review of Systems:  Pertinent positives mentioned in HPI. Remainder of all ROS negative.   Physical Exam:  Vitals:   11/13/15 1037  BP: (!) 147/60  Pulse: (!) 57  Temp: 97.9 F (36.6 C)  TempSrc: Oral  SpO2: 100%  Weight: 139 lb 4.8 oz (63.2 kg)   Physical Exam  Constitutional: She is oriented to person, place, and time. She appears well-developed and well-nourished. No distress.  HENT:  Mouth/Throat: Oropharynx is clear and moist.    Eyes: EOM are normal.  Neck: Neck supple. No tracheal deviation present.  Cardiovascular: Normal rate, regular rhythm and intact distal pulses.   Pulmonary/Chest: Effort normal and breath sounds normal. No respiratory distress.  Abdominal: Soft. Bowel sounds are normal. She exhibits no distension. There is no tenderness.  Musculoskeletal:  L wirst: Slight pain upon flexion and extension of the wrist. Finkelstein's test positive. No thenar atrophy. Tinnel's sign negative.   Neurological: She is alert and oriented to person, place, and time.  Skin: Skin is warm and dry.    Assessment & Plan:   See Encounters Tab for problem based charting.  Patient discussed with Dr. Angelia Mould

## 2015-11-15 NOTE — Assessment & Plan Note (Signed)
HPI Patient reports having pain in her L wrist/ distal forearm for he past few weeks. States she is not able to lift any objects with this hand 2/2 pain. Denies any injury to the area. She is R handed and does not use her L hand to engage in activities that require repetitive motion. Reports having hx of b/l carpal tunnel surgeries in the past but states this pain is different. Denies experiencing any sharp, shooting pain in her wrist. Also c/o chronic L shoulder pain and stiffness for which she was previously prescribed voltaren gel, tramadol, and tylenol. Patient states she has not used any of these medications.   A De Quervain's tenosynovitis. Finkelstein's test positive on exam.  P -Voltaren gel 4 times a day for wrist and shoulder pain -Tylenol prn -Forearm-based thumb spica splint  -Explained to the patient that if 2-6 weeks of conservative therapy does not help, corticosteorid injections are an option.

## 2015-11-19 NOTE — Progress Notes (Signed)
Internal Medicine Clinic Attending  Case discussed with Dr. Rathoreat the time of the visit. We reviewed the resident's history and exam and pertinent patient test results. I agree with the assessment, diagnosis, and plan of care documented in the resident's note.  

## 2015-11-20 ENCOUNTER — Other Ambulatory Visit: Payer: Self-pay | Admitting: Vascular Surgery

## 2015-11-23 ENCOUNTER — Other Ambulatory Visit: Payer: Self-pay | Admitting: Internal Medicine

## 2015-11-23 DIAGNOSIS — E118 Type 2 diabetes mellitus with unspecified complications: Secondary | ICD-10-CM

## 2015-11-26 ENCOUNTER — Other Ambulatory Visit: Payer: Self-pay | Admitting: Internal Medicine

## 2015-11-26 DIAGNOSIS — Z1231 Encounter for screening mammogram for malignant neoplasm of breast: Secondary | ICD-10-CM

## 2015-11-26 NOTE — Addendum Note (Signed)
Addended by: Hulan Fray on: 11/26/2015 07:41 PM   Modules accepted: Orders

## 2015-11-27 ENCOUNTER — Other Ambulatory Visit: Payer: Self-pay | Admitting: Internal Medicine

## 2015-11-27 DIAGNOSIS — Z794 Long term (current) use of insulin: Principal | ICD-10-CM

## 2015-11-27 DIAGNOSIS — N183 Chronic kidney disease, stage 3 (moderate): Principal | ICD-10-CM

## 2015-11-27 DIAGNOSIS — E1122 Type 2 diabetes mellitus with diabetic chronic kidney disease: Secondary | ICD-10-CM

## 2015-11-27 NOTE — Telephone Encounter (Signed)
Received refill request for metformin-which was discontinued last visit due to CKD with GFR near 30, please advise.Despina Hidden Cassady11/14/20174:31 PM

## 2015-11-28 ENCOUNTER — Encounter: Payer: PPO | Admitting: Internal Medicine

## 2015-12-04 LAB — HM DIABETES EYE EXAM

## 2015-12-04 NOTE — Assessment & Plan Note (Addendum)
Patient was recently seen on 11/13/2015 with left wrist and distal forearm pain for the past several weeks. Finkelstein's test was positive on exam and she was diagnosed with De Quervain's Tenosynovitis. She was told to use Voltaren gel, Tylenol and a spica splint. If not improvement, plans were to discuss corticosteroid injection. Patient states she continues to have pain, but has not used the voltaren gel or tylenol as she didn't know to use those. She does not want to pursue steroid injection at this time.  Plan: -Start voltaren gel QID -Start Tylenol 500-650 mg Q6H prn  -Continue brace

## 2015-12-04 NOTE — Progress Notes (Signed)
CC: Follow up for follow up for DeQuervain Tenosynovitis  HPI: [name] is a 75 y.o. female with PMHx of HTN and T2DM who presents to the clinic for follow up for T2DM. Please see problem based assessment and plan for more information.   Patient was recently seen on 11/13/2015 with left wrist and distal forearm pain for the past several weeks. Finkelstein's test was positive on exam and she was diagnosed with De Quervain's Tenosynovitis. She was told to use Voltaren gel, Tylenol and a spica splint. If not improvement, plans were to discuss corticosteroid injection. Patient states she continues to have pain, but has not used the voltaren gel or tylenol as she didn't know to use those. She does not want to pursue steroid injection at this time.  Her blood pressure had previously been uncontrolled, so her amlodipine was increased to 10 mg daily in addition to continuing her atenolol 12.5 mg QD. HR was in the 50s. If BP continued to be uncontrolled, we discussed adding hydralazine or terazosin.   Patient has moderately uncontrolled T2DM. Last A1c was 8.1 in September. At that time, we added glipizide 5 mg daily in addition to her Lantus 44 units QHS and Novolog 14/14/14.   Past Medical History:  Diagnosis Date  . CAD (coronary artery disease) 2006   s/p Quadrupal CABG 2006  . CHF (congestive heart failure) ([place]) 2010   EF 30-35% from Echo 06/2008  . CKD (chronic kidney disease) stage 4, GFR 15-29 ml/min (HCC)    fluctuating between stage 3 and 4 depending on GFR  . Diverticulosis   . DIVERTICULOSIS OF COLON 04/01/2007   Qualifier: Diagnosis of  By: Nelson-Smith CMA (AAMA), [name]    . DM (diabetes mellitus) ([place])    insulin dependent  . History of colonic polyps   . HLD (hyperlipidemia)   . HTN (hypertension)   . Lumbar spinal stenosis   . MI (myocardial infarction) 2003   . [place]  . Mouth ulcer adjacent to jaw bone 08/22/2010   S/p removal by ENT 09/2010   .  MYOCARDIAL INFARCTION, HX OF 09/09/2006   Qualifier: Diagnosis of  By: Marinda Elk MD, Sonia Side    . Nephrolithiasis    hx of  . NEPHROLITHIASIS 04/01/2007   Qualifier: Diagnosis of  By: Harlon Ditty CMA (AAMA), [name]    . OA (osteoarthritis)   . Osteopenia 2010   T score of -1.8  . Psoriasis   . PVD (peripheral vascular disease) (HCC)    ABI indicated moderated reduction arterial flow on the left.  . Tricuspid regurgitation 2009   moderate to severe, EF 55%     Review of Systems: Please see pertinent ROS reviewed in HPI and problem based charting.   Physical Exam: Vitals:   12/05/15 0949  BP: (!) 149/57  Pulse: (!) 57  Temp: 98.1 F (36.7 C)  TempSrc: Oral  SpO2: 95%  Weight: 141 lb 12.8 oz (64.3 kg)  Height: 5\' 1"  (1.549 m)   General: Vital signs reviewed.  Patient is well-developed and well-nourished, in no acute distress and cooperative with exam.  Cardiovascular: Bradycardic, regular rhythm Pulmonary/Chest: Clear to auscultation bilaterally, no wheezes, rales, or rhonchi. Abdominal: Soft, non-tender, non-distended, BS + Musculoskeletal: + tenderness along anatomical snuff box. Finkelstein's test positive. Negative Tinel's sign. Extremities: No lower extremity edema bilaterally Skin: Warm, dry and intact. No rashes or erythema. Psychiatric: Normal mood and affect. speech and behavior is normal. Cognition and memory are normal.   Assessment &  Plan:  See encounters tab for problem based medical decision making. Patient discussed with Dr. Eppie Gibson

## 2015-12-04 NOTE — Assessment & Plan Note (Addendum)
Her blood pressure had previously been uncontrolled, so her amlodipine was increased to 10 mg daily in addition to continuing her atenolol 12.5 mg QD. HR was in the 50s. If BP continued to be uncontrolled, we discussed adding hydralazine or terazosin. BP today 149/57.   Plan: -Continue current medications

## 2015-12-04 NOTE — Assessment & Plan Note (Addendum)
Patient has moderately uncontrolled T2DM. Last A1c was 8.1 in September. At that time, we added glipizide 5 mg daily in addition to her Lantus 44 units QHS and Novolog 14/14/14. She is worried about taking the glipizide due to sporadic hypoglycemia in the past. Her main hyperglycemic events tend to occur after lunch.   Plan: -Increase lunchtime insulin to 16 units -Continue Novolog 14 units in the morning and 14 units before dinner -Continue Lantus 44 units at night

## 2015-12-05 ENCOUNTER — Encounter: Payer: Self-pay | Admitting: Internal Medicine

## 2015-12-05 ENCOUNTER — Ambulatory Visit (INDEPENDENT_AMBULATORY_CARE_PROVIDER_SITE_OTHER): Payer: PPO | Admitting: Internal Medicine

## 2015-12-05 DIAGNOSIS — I10 Essential (primary) hypertension: Secondary | ICD-10-CM

## 2015-12-05 DIAGNOSIS — N183 Chronic kidney disease, stage 3 (moderate): Secondary | ICD-10-CM

## 2015-12-05 DIAGNOSIS — Z794 Long term (current) use of insulin: Secondary | ICD-10-CM

## 2015-12-05 DIAGNOSIS — M654 Radial styloid tenosynovitis [de Quervain]: Secondary | ICD-10-CM

## 2015-12-05 DIAGNOSIS — I129 Hypertensive chronic kidney disease with stage 1 through stage 4 chronic kidney disease, or unspecified chronic kidney disease: Secondary | ICD-10-CM

## 2015-12-05 DIAGNOSIS — Z79899 Other long term (current) drug therapy: Secondary | ICD-10-CM

## 2015-12-05 DIAGNOSIS — E1165 Type 2 diabetes mellitus with hyperglycemia: Secondary | ICD-10-CM | POA: Diagnosis not present

## 2015-12-05 DIAGNOSIS — E1122 Type 2 diabetes mellitus with diabetic chronic kidney disease: Secondary | ICD-10-CM

## 2015-12-05 MED ORDER — FUROSEMIDE 40 MG PO TABS: 40.0000 mg | ORAL_TABLET | Freq: Every day | ORAL | 3 refills | Status: DC

## 2015-12-05 MED ORDER — ATENOLOL 25 MG PO TABS: 12.5000 mg | ORAL_TABLET | Freq: Every day | ORAL | 3 refills | Status: DC

## 2015-12-05 MED ORDER — POTASSIUM CHLORIDE ER 10 MEQ PO TBCR: 10.0000 meq | EXTENDED_RELEASE_TABLET | Freq: Two times a day (BID) | ORAL | 3 refills | Status: DC

## 2015-12-05 MED ORDER — INSULIN REGULAR HUMAN 100 UNIT/ML IJ SOLN: INTRAMUSCULAR | 11 refills | Status: DC

## 2015-12-05 MED ORDER — DICLOFENAC SODIUM 1 % TD GEL: 2.0000 g | Freq: Four times a day (QID) | TRANSDERMAL | 3 refills | Status: DC

## 2015-12-05 MED ORDER — "INSULIN SYRINGE-NEEDLE U-100 31G X 15/64"" 0.3 ML MISC": 3 refills | Status: DC

## 2015-12-05 NOTE — Patient Instructions (Signed)
TAKE 14 UNITS OF INSULIN BEFORE BREAKFAST, 16 BEFORE LUNCH AND 14 UNITS BEFORE DINNER.  FOLLOW UP IN 2 MONTHS.   TAKE TYLENOL 500-650 MG EVERY 6 HOURS AS NEEDED FOR PAIN. USE VOLTAREN GEL ON YOUR WRIST AS WELL.

## 2015-12-11 NOTE — Progress Notes (Signed)
Case discussed with Dr. Burns soon after the resident saw the patient.  We reviewed the resident's history and exam and pertinent patient test results.  I agree with the assessment, diagnosis and plan of care documented in the resident's note. 

## 2015-12-19 ENCOUNTER — Encounter: Payer: PPO | Admitting: Internal Medicine

## 2015-12-19 ENCOUNTER — Encounter: Payer: Self-pay | Admitting: Family

## 2015-12-24 ENCOUNTER — Ambulatory Visit: Payer: PPO | Admitting: Family

## 2015-12-24 ENCOUNTER — Encounter (HOSPITAL_COMMUNITY): Payer: PPO

## 2015-12-24 ENCOUNTER — Encounter: Payer: Self-pay | Admitting: Family

## 2015-12-25 ENCOUNTER — Ambulatory Visit (INDEPENDENT_AMBULATORY_CARE_PROVIDER_SITE_OTHER): Payer: PPO | Admitting: Family

## 2015-12-25 ENCOUNTER — Encounter: Payer: Self-pay | Admitting: Family

## 2015-12-25 ENCOUNTER — Other Ambulatory Visit: Payer: Self-pay

## 2015-12-25 ENCOUNTER — Ambulatory Visit (HOSPITAL_COMMUNITY)
Admission: RE | Admit: 2015-12-25 | Discharge: 2015-12-25 | Disposition: A | Discharge status: Home / Self Care | Payer: PPO | Source: Ambulatory Visit | Attending: Family | Admitting: Family

## 2015-12-25 VITALS — BP 149/74 | HR 56 | Temp 97.4°F | Resp 18 | Wt 140.0 lb

## 2015-12-25 DIAGNOSIS — N183 Chronic kidney disease, stage 3 (moderate): Principal | ICD-10-CM

## 2015-12-25 DIAGNOSIS — E1151 Type 2 diabetes mellitus with diabetic peripheral angiopathy without gangrene: Secondary | ICD-10-CM | POA: Diagnosis not present

## 2015-12-25 DIAGNOSIS — I739 Peripheral vascular disease, unspecified: Secondary | ICD-10-CM | POA: Diagnosis present

## 2015-12-25 DIAGNOSIS — R0989 Other specified symptoms and signs involving the circulatory and respiratory systems: Secondary | ICD-10-CM

## 2015-12-25 DIAGNOSIS — I779 Disorder of arteries and arterioles, unspecified: Secondary | ICD-10-CM

## 2015-12-25 DIAGNOSIS — E1122 Type 2 diabetes mellitus with diabetic chronic kidney disease: Secondary | ICD-10-CM

## 2015-12-25 DIAGNOSIS — Z95828 Presence of other vascular implants and grafts: Secondary | ICD-10-CM | POA: Diagnosis not present

## 2015-12-25 NOTE — Patient Instructions (Signed)

## 2015-12-25 NOTE — Progress Notes (Signed)
[facility] [place]   CC: Follow up peripheral artery occlusive disease  History of Present Illness [name] is a 75 y.o. female patient of Dr. Scot Dock who had presented with progressive ischemia of the left lower extremity and significant rest pain. She had no left femoral pulse. Of note her ABI was 25% and on exam she had evidence of multilevel arterial occlusive disease.  She was taken to the peripheral vascular lab on 07/03/2014. She underwent PTA and 6 mm x 29 mm stent of the left common iliac artery for a chronic total occlusion. There was no residual stenosis.  Pt denies claudication sx's with walking, denies non healing wounds.  She has started walking a little more since her last visit, she has no barriers to walking other than likely deconditioning.   She has low and mid back pain aggravated by cold, denies radiculopathy symptoms.   She has done well since the procedure and denies rest pain in her left foot now. He is not a smoker. She is on 81 mg of aspirin. She is on Plavix. She is on a statin.  Pt denies any hx of stroke or TIA.   The patient denies New Medical or Surgical History.  Pt Diabetic: Yes, A1C was 7.4 in November 2016, 8.1 in September 2017 (review of records) Pt smoker: non-smoker  Pt meds include: Statin :Yes Betablocker: Yes ASA: Yes Other anticoagulants/antiplatelets: Plavix      Past Medical History:  Diagnosis Date  . CAD (coronary artery disease) 2006   s/p Quadrupal CABG 2006  . CHF (congestive heart failure) ([place]) 2010   EF 30-35% from Echo 06/2008  . CKD (chronic kidney disease) stage 4, GFR 15-29 ml/min (HCC)    fluctuating between stage 3 and 4 depending on GFR  . Diverticulosis   . DIVERTICULOSIS OF COLON 04/01/2007   Qualifier: Diagnosis of  By: Nelson-Smith CMA (AAMA), [name]    . DM (diabetes mellitus) ([place])    insulin dependent  . History of colonic polyps   . HLD (hyperlipidemia)   . HTN  (hypertension)   . Lumbar spinal stenosis   . MI (myocardial infarction) 2003   . [place]  . Mouth ulcer adjacent to jaw bone 08/22/2010   S/p removal by ENT 09/2010   . MYOCARDIAL INFARCTION, HX OF 09/09/2006   Qualifier: Diagnosis of  By: Marinda Elk MD, Sonia Side    . Nephrolithiasis    hx of  . NEPHROLITHIASIS 04/01/2007   Qualifier: Diagnosis of  By: Harlon Ditty CMA (AAMA), [name]    . OA (osteoarthritis)   . Osteopenia 2010   T score of -1.8  . Psoriasis   . PVD (peripheral vascular disease) (HCC)    ABI indicated moderated reduction arterial flow on the left.  . Tricuspid regurgitation 2009   moderate to severe, EF 55%    Social History Social History  Substance Use Topics  . Smoking status: Never Smoker  . Smokeless tobacco: Not on file  . Alcohol use No    Family History Family History  Problem Relation Age of Onset  . Cirrhosis Brother   . Heart disease Brother   . Heart attack Brother   . Hypertension Mother   . Hyperlipidemia Mother   . Hyperlipidemia Son   . Hypertension Son     Past Surgical History:  Procedure Laterality Date  . CATARACT EXTRACTION  2008   left eye  . CORONARY ANGIOPLASTY WITH STENT PLACEMENT  2003  . CORONARY  ARTERY BYPASS GRAFT  2006   4 vessel  . LAPAROSCOPIC SUPRACERVICAL HYSTERECTOMY    . PERIPHERAL VASCULAR CATHETERIZATION N/A 07/03/2014   Procedure: Abdominal Aortogram w/Lower Extremity;  Surgeon: Angelia Mould, MD;  Location: [facility];  Service: Cardiovascular;  Laterality: N/A;    Allergies  Allergen Reactions  . Valsartan Cough    Current Outpatient Prescriptions  Medication Sig Dispense Refill  . ACCU-CHEK FASTCLIX LANCETS MISC Check blood sugar 3 times a day 102 each 12  . amLODipine (NORVASC) 10 MG tablet Take 0.5 tablets (5 mg total) by mouth daily. 90 tablet 3  . aspirin EC 81 MG tablet Take 81 mg by mouth daily.    Marland Kitchen atenolol (TENORMIN) 25 MG tablet Take 0.5 tablets (12.5 mg total) by mouth  daily. 45 tablet 3  . Blood Glucose Monitoring Suppl (FREESTYLE LITE) DEVI Check blood sugar up to 4 times a day 1 each 0  . Calcium Carb-Cholecalciferol (CALCIUM 600 + D PO) Take 1 tablet by mouth 2 (two) times daily.    . clopidogrel (PLAVIX) 75 MG tablet TAKE 1 TABLET BY MOUTH DAILY. 90 tablet 6  . diclofenac sodium (VOLTAREN) 1 % GEL Apply 2 g topically 4 (four) times daily. 100 g 3  . famotidine (PEPCID) 20 MG tablet Take 1 tablet (20 mg total) by mouth 2 (two) times daily. 180 tablet 3  . furosemide (LASIX) 40 MG tablet Take 1 tablet (40 mg total) by mouth daily. 90 tablet 3  . glucose blood (FREESTYLE LITE) test strip 1 each by Other route 4 (four) times daily -  before meals and at bedtime. 100 each 11  . HUMIRA PEN 40 MG/0.8ML PNKT     . insulin regular (NOVOLIN R) 250 units/2.22mL (100 units/mL) injection INJECT 14 UNITS BEFORE BREAKFAST, 16 UNITS BEFORE LUNCH AND 14 UNITS BEFORE DINNER 15 mL 11  . Insulin Syringe-Needle U-100 31G X 15/64" 0.3 ML MISC Use to inject mealtime insulin 3 times a day Dx code 250.00 insulin requiring 300 each 3  . Lancets (FREESTYLE) lancets 1 each by Other route 4 (four) times daily -  before meals and at bedtime. 100 each 11  . LANTUS SOLOSTAR 100 UNIT/ML Solostar Pen INJECT 44 UNITS INTO THE SKIN DAILY AT 10PM 45 mL 11  . potassium chloride (K-DUR) 10 MEQ tablet Take 1 tablet (10 mEq total) by mouth 2 (two) times daily. 180 tablet 3  . rosuvastatin (CRESTOR) 20 MG tablet Take 1 tablet (20 mg total) by mouth daily. 90 tablet 3  . traMADol (ULTRAM) 50 MG tablet Take 1 tablet (50 mg total) by mouth daily as needed for severe pain. 30 tablet 3   No current facility-administered medications for this visit.     ROS: See HPI for pertinent positives and negatives.   Physical Examination  Vitals:   12/25/15 1035  BP: (!) 149/74  Pulse: (!) 56  Resp: 18  Temp: 97.4 F (36.3 C)  SpO2: 97%  Weight: 140 lb (63.5 kg)   Body mass index is 26.45  kg/m.  General: A&O x 3, WDWN, female. Gait: normal Eyes: PERRLA. Pulmonary: Respirations are non labored, CTAB, no wheezes , rales or rhonchi. Occasional dry cough. Cardiac: regular rhythm, no detected murmur.     Carotid Bruits Right Left   positive Negative  Aorta is not palpable. Radial pulses: 2+ palpable and =   VASCULAR EXAM: Extremities without ischemic changes, without Gangrene; without open wounds.     LE Pulses  Right Left   FEMORAL 2+ palpable 2+ palpable    POPLITEAL not palpable  not palpable   POSTERIOR TIBIAL not palpable  not palpable    DORSALIS PEDIS  ANTERIOR TIBIAL 2+ palpable  2+ palpable    Abdomen: soft, NT, no palpable masses. Skin: no rashes, no ulcers. Musculoskeletal: no muscle wasting or atrophy. Neurologic: A&O X 3; Appropriate Affect ; SENSATION: normal; MOTOR FUNCTION: moving all extremities equally, motor strength 5/5 throughout in upper extremities, 4/5 in LE's. Speech is fluent/normal.  CN 2-12 intact    ASSESSMENT: SAMANTHAMARIE EZZELL is a 75 y.o. female who is s/p stent placement in the left common iliac artery for a chronic total occlusion on 07/03/14. She no longer has claudication sx's with walking, has no signs of ischemia in her lower extremities.   Right carotid bruit present, no carotid duplex result on file, no history of stroke or TIA, see Plan.  Her primary atherosclerotic risk factor is her uncontrolled DM; other risk factors include CKD  and CAD.  Fortunately she has never used tobacco.   Pt requested referral to and endocrinologist to help her manage her DM; see Plan.  DATA ABI's today indicate improved to no (0.99 today, was 0.88 on 06-19-15) arterial occlusive disease of the right leg with  biphasic waveforms, and moderate (0.70 today, was 0.74) arterial occlusive disease of the left leg, with mono (PT) and triphasic(DP) waveforms. Slight improvement of ABI in right leg, stable in left. Right TBI is 0.57, left is 0.66.   PLAN:  Referral to Dr. Elyse Hsu, endocrinologist.  Graduated walking program discussed and how to achieve.  Based on the patient's vascular studies and examination, pt will return to clinic in 6 months with ABI's and carotid duplex.  I discussed in depth with the patient the nature of atherosclerosis, and emphasized the importance of maximal medical management including strict control of blood pressure, blood glucose, and lipid levels, obtaining regular exercise, and continued cessation of smoking.  The patient is aware that without maximal medical management the underlying atherosclerotic disease process will progress, limiting the benefit of any interventions.  The patient was given information about PAD including signs, symptoms, treatment, what symptoms should prompt the patient to seek immediate medical care, and risk reduction measures to take.  Clemon Chambers, RN, MSN, FNP-C [facility] Phone: [phone]  Clinic MD: Early  12/25/15 10:43 AM

## 2015-12-26 ENCOUNTER — Other Ambulatory Visit: Payer: Self-pay

## 2015-12-26 ENCOUNTER — Telehealth: Payer: Self-pay

## 2015-12-26 DIAGNOSIS — N183 Chronic kidney disease, stage 3 (moderate): Principal | ICD-10-CM

## 2015-12-26 DIAGNOSIS — E1122 Type 2 diabetes mellitus with diabetic chronic kidney disease: Secondary | ICD-10-CM

## 2015-12-26 NOTE — Telephone Encounter (Signed)
Please call pt back regarding meds.  

## 2015-12-27 NOTE — Telephone Encounter (Signed)
Lm at both ph#

## 2015-12-27 NOTE — Telephone Encounter (Signed)
Lm for rtc 

## 2015-12-28 ENCOUNTER — Other Ambulatory Visit: Payer: Self-pay

## 2015-12-28 DIAGNOSIS — E1122 Type 2 diabetes mellitus with diabetic chronic kidney disease: Secondary | ICD-10-CM

## 2015-12-28 DIAGNOSIS — Z794 Long term (current) use of insulin: Principal | ICD-10-CM

## 2015-12-28 DIAGNOSIS — N183 Chronic kidney disease, stage 3 (moderate): Principal | ICD-10-CM

## 2015-12-28 MED ORDER — PEN NEEDLES 32G X 4 MM MISC: 1.0000 | Freq: Three times a day (TID) | 11 refills | Status: DC

## 2015-12-28 MED ORDER — INSULIN REGULAR HUMAN 100 UNIT/ML IJ SOLN: INTRAMUSCULAR | 11 refills | Status: DC

## 2015-12-28 NOTE — Telephone Encounter (Signed)
Call from pt - Wants to know if she should be taking Glipizide or not. States she has been taking it; CBG this morning was 164. Also refill on pen needles and 3 month of Novolin. Thanks

## 2015-12-28 NOTE — Telephone Encounter (Signed)
Pt asking about whether she should be taking Glipizide or not. Thanks

## 2015-12-28 NOTE — Telephone Encounter (Signed)
Please call pt back regarding meds.  

## 2015-12-31 ENCOUNTER — Other Ambulatory Visit: Payer: Self-pay | Admitting: Internal Medicine

## 2015-12-31 NOTE — Telephone Encounter (Signed)
Pt called - no answer; left message she can stop taking Glipizide which was discontinued at last visit per Dr Quay Burow. And call for any questions.

## 2015-12-31 NOTE — Telephone Encounter (Signed)
Hi Glenda,  Can you please let the patient know that she can stop taking the glipizide? We discontinued it last visit.   Thank you, Alexa

## 2016-01-01 NOTE — Addendum Note (Signed)
Addended by: Lianne Cure A on: 01/01/2016 09:19 AM   Modules accepted: Orders

## 2016-01-04 ENCOUNTER — Other Ambulatory Visit: Payer: Self-pay | Admitting: Internal Medicine

## 2016-01-04 ENCOUNTER — Ambulatory Visit: Payer: PPO

## 2016-01-04 DIAGNOSIS — I10 Essential (primary) hypertension: Secondary | ICD-10-CM

## 2016-01-15 ENCOUNTER — Encounter: Payer: Self-pay | Admitting: *Deleted

## 2016-02-04 ENCOUNTER — Ambulatory Visit
Admission: RE | Admit: 2016-02-04 | Discharge: 2016-02-04 | Disposition: A | Discharge status: Home / Self Care | Payer: PPO | Source: Ambulatory Visit | Attending: Internal Medicine | Admitting: Internal Medicine

## 2016-02-04 DIAGNOSIS — Z1231 Encounter for screening mammogram for malignant neoplasm of breast: Secondary | ICD-10-CM

## 2016-02-19 ENCOUNTER — Telehealth: Payer: Self-pay | Admitting: Internal Medicine

## 2016-02-20 ENCOUNTER — Ambulatory Visit (INDEPENDENT_AMBULATORY_CARE_PROVIDER_SITE_OTHER): Payer: PPO | Admitting: Internal Medicine

## 2016-02-20 ENCOUNTER — Encounter: Payer: Self-pay | Admitting: Internal Medicine

## 2016-02-20 VITALS — BP 134/60 | HR 60 | Temp 98.8°F | Ht 61.0 in | Wt 139.8 lb

## 2016-02-20 DIAGNOSIS — I129 Hypertensive chronic kidney disease with stage 1 through stage 4 chronic kidney disease, or unspecified chronic kidney disease: Secondary | ICD-10-CM | POA: Diagnosis not present

## 2016-02-20 DIAGNOSIS — E1122 Type 2 diabetes mellitus with diabetic chronic kidney disease: Secondary | ICD-10-CM

## 2016-02-20 DIAGNOSIS — M8580 Other specified disorders of bone density and structure, unspecified site: Secondary | ICD-10-CM

## 2016-02-20 DIAGNOSIS — Z794 Long term (current) use of insulin: Secondary | ICD-10-CM | POA: Diagnosis not present

## 2016-02-20 DIAGNOSIS — N183 Chronic kidney disease, stage 3 unspecified: Secondary | ICD-10-CM

## 2016-02-20 DIAGNOSIS — Z79899 Other long term (current) drug therapy: Secondary | ICD-10-CM

## 2016-02-20 DIAGNOSIS — I10 Essential (primary) hypertension: Secondary | ICD-10-CM

## 2016-02-20 LAB — GLUCOSE, CAPILLARY: Glucose-Capillary: 145 mg/dL — ABNORMAL HIGH (ref 65–99)

## 2016-02-20 LAB — POCT GLYCOSYLATED HEMOGLOBIN (HGB A1C): HEMOGLOBIN A1C: 8

## 2016-02-20 MED ORDER — INSULIN REGULAR HUMAN 100 UNIT/ML IJ SOLN: INTRAMUSCULAR | 11 refills | Status: DC

## 2016-02-20 NOTE — Assessment & Plan Note (Signed)
BP Readings from Last 3 Encounters:  02/20/16 134/60  12/25/15 (!) 149/74  12/05/15 (!) 149/57    Lab Results  Component Value Date   NA 144 06/06/2015   K 4.1 06/06/2015   CREATININE 1.57 (H) 06/06/2015    Assessment: Blood pressure control:  controlled Progress toward BP goal:   at goal Comments: Patient reports compliance with amlodipine 10 mg once a day and atenolol 12.5 mg once a day.  Plan: Medications:  continue current medications Other plans: Follow-up in 3 months

## 2016-02-20 NOTE — Progress Notes (Signed)
    CC: Follow-up for diabetes  HPI: [name] is a 76 y.o. female with PMHx of type 2 diabetes, CKD, hypertension who presents to the clinic for follow-up for type 2 diabetes.   Please see problem based assessment and plan for more information of her chronic medical conditions. Patient denies chest pain, shortness of breath, nausea, vomiting, abdominal pain, new swelling or pain in her legs.  Past Medical History:  Diagnosis Date  . CAD (coronary artery disease) 2006   s/p Quadrupal CABG 2006  . CHF (congestive heart failure) ([place]) 2010   EF 30-35% from Echo 06/2008  . CKD (chronic kidney disease) stage 4, GFR 15-29 ml/min (HCC)    fluctuating between stage 3 and 4 depending on GFR  . Diverticulosis   . DIVERTICULOSIS OF COLON 04/01/2007   Qualifier: Diagnosis of  By: Nelson-Smith CMA (AAMA), [name]    . DM (diabetes mellitus) ([place])    insulin dependent  . History of colonic polyps   . HLD (hyperlipidemia)   . HTN (hypertension)   . Lumbar spinal stenosis   . MI (myocardial infarction) 2003   . [place]  . Mouth ulcer adjacent to jaw bone 08/22/2010   S/p removal by ENT 09/2010   . MYOCARDIAL INFARCTION, HX OF 09/09/2006   Qualifier: Diagnosis of  By: Marinda Elk MD, Sonia Side    . Nephrolithiasis    hx of  . NEPHROLITHIASIS 04/01/2007   Qualifier: Diagnosis of  By: Harlon Ditty CMA (AAMA), [name]    . OA (osteoarthritis)   . Osteopenia 2010   T score of -1.8  . Psoriasis   . PVD (peripheral vascular disease) (HCC)    ABI indicated moderated reduction arterial flow on the left.  . Tricuspid regurgitation 2009   moderate to severe, EF 55%     Review of Systems: Please see pertinent ROS reviewed in HPI and problem based charting.   Physical Exam: Vitals:   02/20/16 1508  BP: 134/60  Pulse: 60  Temp: 98.8 F (37.1 C)  TempSrc: Oral  SpO2: 98%  Weight: 139 lb 12.8 oz (63.4 kg)  Height: 5\' 1"  (1.549 m)   General: Vital signs reviewed.  Patient is Elderly, in  no acute distress and cooperative with exam.  Cardiovascular: RRR, S1 normal, S2 normal, no murmurs, gallops, or rubs. Pulmonary/Chest: Clear to auscultation bilaterally, no wheezes, rales, or rhonchi. Abdominal: Soft, non-tender, non-distended, BS + Extremities: No lower extremity edema bilaterally Skin: Warm, dry and intact. No rashes or erythema. Psychiatric: Normal mood and affect. speech and behavior is normal.   Assessment & Plan:  See encounters tab for problem based medical decision making. Patient discussed with Dr. Dareen Piano

## 2016-02-20 NOTE — Assessment & Plan Note (Signed)
Patient has a history of chronic kidney disease stage III. Last evaluated in March 2017, we will repeat a basic metabolic panel today.

## 2016-02-20 NOTE — Assessment & Plan Note (Signed)
We scheduled patient for a DEXA scan today.

## 2016-02-20 NOTE — Assessment & Plan Note (Signed)
Lab Results  Component Value Date   HGBA1C 8.0 02/20/2016   HGBA1C 8.1 09/19/2015   HGBA1C 6.9 06/06/2015     Assessment: Diabetes control:  slightly above goal Progress toward A1C goal:   some improvement Comments: Patient reports compliance with her NovoLog 14 units in the morning, 16 units before lunch, 14 units before dinner. She also reports compliance with Lantus 44 units at night. She does a great job checking her blood sugars throughout the day she consistently checks them before each meal. Her fasting blood sugars run from 60s to 160 in the morning. Her glucose before lunch is more elevated in the upper 100s to 300s. Blood glucose before dinner is in the 100s to 200s. We discussed how to better control her blood glucose throughout the day. I gave her the option of oral medication such as by mouth glitazone considering her heart failure symptoms are well controlled or we could fine tune her insulin regimen. I know that vascular surgeon referred her to an endocrinologist; however, I feel that we can adequately manage her in the clinic. The patient does not want to drive to [facility] which I think is reasonable. Her A1c is only 8.0, and I do think we could improve her given her other comorbidities, however I do think we should take into consideration that she is 76 years old and has a history of hypoglycemia. Patient would prefer to fine tune her insulin regimen rather than starting an oral medication. She would like to continue until for the internal medicine clinic to address her diabetes.  Plan: Medications:  Increase morning NovoLog to 16 units before breakfast, continue exceeding units before lunch and 14 units before dinner. Continue Lantus 44 units at dinner. Instruction/counseling given: reminded to bring blood glucose meter & log to each visit, reminded to bring medications to each visit and discussed diet Other plans: Follow up in 3 months. This patient is to be more active during  the day such as exercising or moving furniture as she did recently, she should take 2 less units per meal to avoid hypoglycemia. She should also check her blood sugars more frequently.

## 2016-02-20 NOTE — Patient Instructions (Signed)
Continue all medication as prescribed.   For your diabetes, we will increase your novolog to 16 units in the morning. Continue taking 16 units before lunch and 14 units before dinner. Continue Lantus 44 units at night. You are doing a great job checking your blood sugar!  Please follow up in 3 months.

## 2016-02-21 ENCOUNTER — Encounter: Payer: Self-pay | Admitting: Internal Medicine

## 2016-02-21 ENCOUNTER — Other Ambulatory Visit: Payer: Self-pay | Admitting: Internal Medicine

## 2016-02-21 DIAGNOSIS — E1122 Type 2 diabetes mellitus with diabetic chronic kidney disease: Secondary | ICD-10-CM

## 2016-02-21 DIAGNOSIS — N183 Chronic kidney disease, stage 3 (moderate): Principal | ICD-10-CM

## 2016-02-21 DIAGNOSIS — Z794 Long term (current) use of insulin: Principal | ICD-10-CM

## 2016-02-21 LAB — BMP8+ANION GAP
Anion Gap: 21 mmol/L — ABNORMAL HIGH (ref 10.0–18.0)
BUN / CREAT RATIO: 12 (ref 12–28)
BUN: 18 mg/dL (ref 8–27)
CHLORIDE: 96 mmol/L (ref 96–106)
CO2: 28 mmol/L (ref 18–29)
Calcium: 9.3 mg/dL (ref 8.7–10.3)
Creatinine, Ser: 1.5 mg/dL — ABNORMAL HIGH (ref 0.57–1.00)
GFR calc non Af Amer: 33 mL/min/{1.73_m2} — ABNORMAL LOW (ref >59)
GFR, EST AFRICAN AMERICAN: 38 mL/min/{1.73_m2} — AB (ref >59)
GLUCOSE: 119 mg/dL — AB (ref 65–99)
POTASSIUM: 3.4 mmol/L — AB (ref 3.5–5.2)
SODIUM: 145 mmol/L — AB (ref 134–144)

## 2016-02-26 ENCOUNTER — Ambulatory Visit
Admission: RE | Admit: 2016-02-26 | Discharge: 2016-02-26 | Disposition: A | Discharge status: Home / Self Care | Payer: PPO | Source: Ambulatory Visit | Attending: Internal Medicine | Admitting: Internal Medicine

## 2016-02-26 ENCOUNTER — Encounter: Payer: Self-pay | Admitting: Internal Medicine

## 2016-02-26 DIAGNOSIS — M8580 Other specified disorders of bone density and structure, unspecified site: Secondary | ICD-10-CM

## 2016-02-27 NOTE — Progress Notes (Signed)
Internal Medicine Clinic Attending  Case discussed with Dr. Quay Burow at the time of the visit.  We reviewed the resident's history and exam and pertinent patient test results.  I agree with the assessment, diagnosis, and plan of care documented in the resident's note. Of note, patient is on 16 units of novolog before lunch

## 2016-03-07 ENCOUNTER — Other Ambulatory Visit: Payer: Self-pay | Admitting: Internal Medicine

## 2016-03-07 DIAGNOSIS — K219 Gastro-esophageal reflux disease without esophagitis: Secondary | ICD-10-CM

## 2016-05-21 ENCOUNTER — Encounter (INDEPENDENT_AMBULATORY_CARE_PROVIDER_SITE_OTHER): Payer: Self-pay

## 2016-05-21 ENCOUNTER — Encounter: Payer: Self-pay | Admitting: Internal Medicine

## 2016-05-21 ENCOUNTER — Ambulatory Visit (INDEPENDENT_AMBULATORY_CARE_PROVIDER_SITE_OTHER): Payer: PPO | Admitting: Internal Medicine

## 2016-05-21 VITALS — BP 142/52 | HR 60 | Temp 97.7°F | Ht 61.0 in | Wt 139.4 lb

## 2016-05-21 DIAGNOSIS — Z794 Long term (current) use of insulin: Secondary | ICD-10-CM | POA: Diagnosis not present

## 2016-05-21 DIAGNOSIS — I130 Hypertensive heart and chronic kidney disease with heart failure and stage 1 through stage 4 chronic kidney disease, or unspecified chronic kidney disease: Secondary | ICD-10-CM

## 2016-05-21 DIAGNOSIS — N183 Chronic kidney disease, stage 3 unspecified: Secondary | ICD-10-CM

## 2016-05-21 DIAGNOSIS — R001 Bradycardia, unspecified: Secondary | ICD-10-CM

## 2016-05-21 DIAGNOSIS — Z7902 Long term (current) use of antithrombotics/antiplatelets: Secondary | ICD-10-CM

## 2016-05-21 DIAGNOSIS — E1151 Type 2 diabetes mellitus with diabetic peripheral angiopathy without gangrene: Secondary | ICD-10-CM | POA: Diagnosis not present

## 2016-05-21 DIAGNOSIS — I5032 Chronic diastolic (congestive) heart failure: Secondary | ICD-10-CM

## 2016-05-21 DIAGNOSIS — Z79899 Other long term (current) drug therapy: Secondary | ICD-10-CM | POA: Diagnosis not present

## 2016-05-21 DIAGNOSIS — Z7982 Long term (current) use of aspirin: Secondary | ICD-10-CM | POA: Diagnosis not present

## 2016-05-21 DIAGNOSIS — E1122 Type 2 diabetes mellitus with diabetic chronic kidney disease: Secondary | ICD-10-CM | POA: Diagnosis not present

## 2016-05-21 DIAGNOSIS — Z0000 Encounter for general adult medical examination without abnormal findings: Secondary | ICD-10-CM

## 2016-05-21 DIAGNOSIS — I739 Peripheral vascular disease, unspecified: Secondary | ICD-10-CM

## 2016-05-21 DIAGNOSIS — I5030 Unspecified diastolic (congestive) heart failure: Secondary | ICD-10-CM

## 2016-05-21 DIAGNOSIS — I10 Essential (primary) hypertension: Secondary | ICD-10-CM

## 2016-05-21 DIAGNOSIS — M8580 Other specified disorders of bone density and structure, unspecified site: Secondary | ICD-10-CM | POA: Diagnosis not present

## 2016-05-21 LAB — POCT GLYCOSYLATED HEMOGLOBIN (HGB A1C): HEMOGLOBIN A1C: 8

## 2016-05-21 LAB — GLUCOSE, CAPILLARY: GLUCOSE-CAPILLARY: 298 mg/dL — AB (ref 65–99)

## 2016-05-21 MED ORDER — CLOPIDOGREL BISULFATE 75 MG PO TABS
75.0000 mg | ORAL_TABLET | Freq: Every day | ORAL | 3 refills | Status: DC
Start: 2016-05-21 — End: 2016-10-22

## 2016-05-21 MED ORDER — ROSUVASTATIN CALCIUM 20 MG PO TABS: 20.0000 mg | ORAL_TABLET | Freq: Every day | ORAL | 3 refills | Status: DC

## 2016-05-21 MED ORDER — AMLODIPINE BESYLATE 10 MG PO TABS: 10.0000 mg | ORAL_TABLET | Freq: Every day | ORAL | 3 refills | Status: DC

## 2016-05-21 MED ORDER — TRAMADOL HCL 50 MG PO TABS: 50.0000 mg | ORAL_TABLET | Freq: Every day | ORAL | 2 refills | Status: DC | PRN

## 2016-05-21 MED ORDER — NITROGLYCERIN 0.3 MG SL SUBL
0.3000 mg | SUBLINGUAL_TABLET | SUBLINGUAL | 12 refills | Status: DC | PRN
Start: 2016-05-21 — End: 2019-09-14

## 2016-05-21 NOTE — Assessment & Plan Note (Addendum)
Patient has a history of heart failure with preserved ejection fraction. She is euvolemic on examination. She reports compliance with amlodipine, aspirin, atenolol and Lasix. She is not on a acei or ARB due to allergy.  Assessment: Stable heart failure with preserved ejection fraction  Plan: -Continue current medications

## 2016-05-21 NOTE — Assessment & Plan Note (Signed)
Lab Results  Component Value Date   HGBA1C 8.0 05/21/2016   HGBA1C 8.0 02/20/2016   HGBA1C 8.1 09/19/2015     Assessment: Diabetes control:  at goal Progress toward A1C goal:   stable Comments: Patient reports compliance with Lantus 44 units at night, NovoLog 14 units before breakfast, 16 units before lunch, 16 units before dinner. On review of her meter, patient has hypoglycemic episodes at lunchtime and a few hypoglycemic episodes in the 60s to 90s before dinner. Her A1c is at goal.  Plan: Medications: We will change her NovoLog regimen to 16 units before breakfast and 14 units before lunch. Continue 16 units before dinner. Continue lanuts Self management tools provided: copy of home glucose meter download Other plans: Follow up in 3 months

## 2016-05-21 NOTE — Assessment & Plan Note (Signed)
BP Readings from Last 3 Encounters:  05/21/16 (!) 142/52  02/20/16 134/60  12/25/15 (!) 149/74    Lab Results  Component Value Date   NA 145 (H) 02/20/2016   K 3.4 (L) 02/20/2016   CREATININE 1.50 (H) 02/20/2016    Assessment: Blood pressure control:  slightly above goal Progress toward BP goal:   deteriorated Comments: Patient reports compliance with amlodipine 5 mg daily and atenolol 12.5 mg daily.  Plan: Medications:  Increase amlodipine to 10 mg daily and continue atenolol 12.5 mg daily. Other plans: Follow up in 3 months

## 2016-05-21 NOTE — Assessment & Plan Note (Signed)
Patient has a history of osteopenia. She reports compliance with calcium and vitamin D supplementation. We will check a vitamin D level to see if she is vitamin D deficient. Otherwise, current USPSTF recommendations do not recommend calcium and vitamin D supplementation and osteopenia.  Assessment: Osteopenia  Plan: -Check vitamin D level

## 2016-05-21 NOTE — Progress Notes (Signed)
    CC: Follow-up for diabetes  HPI: [name] is a 76 y.o. female with PMHx of hypertension, PAD, osteopenia, type 2 diabetes who presents to the clinic for follow-up for type 2 diabetes.  Patient is eating and drinking well. She denies chest pain or shortness of breath. She denies weight gain.   Please see problem based assessment and plan for more information of patient's chronic medical conditions.   Past Medical History:  Diagnosis Date  . CAD (coronary artery disease) 2006   s/p Quadrupal CABG 2006  . CHF (congestive heart failure) ([place]) 2010   EF 30-35% from Echo 06/2008  . CKD (chronic kidney disease) stage 4, GFR 15-29 ml/min (HCC)    fluctuating between stage 3 and 4 depending on GFR  . Diverticulosis   . DIVERTICULOSIS OF COLON 04/01/2007   Qualifier: Diagnosis of  By: Nelson-Smith CMA (AAMA), [name]    . DM (diabetes mellitus) ([place])    insulin dependent  . History of colonic polyps   . HLD (hyperlipidemia)   . HTN (hypertension)   . Lumbar spinal stenosis   . MI (myocardial infarction) ([place]) 2003   . [place]   . Mouth ulcer adjacent to jaw bone 08/22/2010   S/p removal by ENT 09/2010   . MYOCARDIAL INFARCTION, HX OF 09/09/2006   Qualifier: Diagnosis of  By: Marinda Elk MD, Sonia Side    . Nephrolithiasis    hx of  . NEPHROLITHIASIS 04/01/2007   Qualifier: Diagnosis of  By: Harlon Ditty CMA (AAMA), [name]    . OA (osteoarthritis)   . Osteopenia 2010   T score of -1.8  . Psoriasis   . PVD (peripheral vascular disease) (HCC)    ABI indicated moderated reduction arterial flow on the left.  . Tricuspid regurgitation 2009   moderate to severe, EF 55%    Review of Systems: Please see pertinent ROS reviewed in HPI and problem based charting.   Physical Exam: Blood pressure (!) 142/52, pulse 60, temperature 97.7 F (36.5 C), temperature source Oral, height 5\' 1"  (1.549 m), weight 139 lb 6.4 oz (63.2 kg), SpO2 97 %. General: Vital signs reviewed.  Patient is in  no acute distress and cooperative with exam.  Eyes: conjunctivae normal, no scleral icterus.  Cardiovascular: Bradycardic, regular rhythm,  no murmurs, gallops, or rubs. No JVD or carotid bruit present. No lower extremity edema bilaterally. Bilateral radial and pedal pulses are intact and symmetric bilaterally.  Pulmonary: Clear to auscultation bilaterally, no wheezes, rales, or rhonchi. No accessory muscle use. Gastrointestinal: Soft, non-tender, non-distended, BS + Neurologic: Awake, alert. Answers all questions appropriately. Moving all extremities equally.   Assessment & Plan:  See encounters tab for problem based medical decision making. Patient discussed with Dr. Dareen Piano

## 2016-05-21 NOTE — Assessment & Plan Note (Signed)
Patient has a history of peripheral arterial disease and follows closely with vascular surgery. She admits to claudication symptoms with exertion which resolves with rest. She denies any new or worsening symptoms. She reports compliance with aspirin, rosuvastatin, Plavix.  Assessment: Stable peripheral arterial disease  Plan: -Continue aspirin -Continue Plavix 75 mg daily -Continue rosuvastatin 20 mg daily -Follow-up with vascular surgery on 06/25/2016

## 2016-05-21 NOTE — Assessment & Plan Note (Signed)
DEXA completed 2/18 Mammogram completed 01/2016

## 2016-05-21 NOTE — Patient Instructions (Signed)
[name],  It was a pleasure to see you today.  For your diabetes: TAKE 16 UNITS BEFORE BREAKFAST TAKE 14 UNITS BEFORE LUNCH TAKE 16 UNITS BEFORE DINNER  CONTINUE TO TAKE LANTUS 30 UNITS AT NIGHT  We will continue all other medications as prescribed.   Follow up in 3 months.

## 2016-05-22 LAB — BMP8+ANION GAP
ANION GAP: 20 mmol/L — AB (ref 10.0–18.0)
BUN/Creatinine Ratio: 18 (ref 12–28)
BUN: 21 mg/dL (ref 8–27)
CALCIUM: 9.3 mg/dL (ref 8.7–10.3)
CHLORIDE: 96 mmol/L (ref 96–106)
CO2: 27 mmol/L (ref 18–29)
Creatinine, Ser: 1.16 mg/dL — ABNORMAL HIGH (ref 0.57–1.00)
GFR calc non Af Amer: 45 mL/min/{1.73_m2} — ABNORMAL LOW (ref >59)
GFR, EST AFRICAN AMERICAN: 52 mL/min/{1.73_m2} — AB (ref >59)
GLUCOSE: 327 mg/dL — AB (ref 65–99)
POTASSIUM: 3.8 mmol/L (ref 3.5–5.2)
Sodium: 143 mmol/L (ref 134–144)

## 2016-05-22 LAB — VITAMIN D 25 HYDROXY (VIT D DEFICIENCY, FRACTURES): VIT D 25 HYDROXY: 38.1 ng/mL (ref 30.0–100.0)

## 2016-05-22 NOTE — Progress Notes (Signed)
Internal Medicine Clinic Attending  Case discussed with Dr. Burns soon after the resident saw the patient.  We reviewed the resident's history and exam and pertinent patient test results.  I agree with the assessment, diagnosis, and plan of care documented in the resident's note. 

## 2016-06-03 ENCOUNTER — Other Ambulatory Visit: Payer: Self-pay | Admitting: Internal Medicine

## 2016-06-03 DIAGNOSIS — E785 Hyperlipidemia, unspecified: Secondary | ICD-10-CM

## 2016-06-03 DIAGNOSIS — Z951 Presence of aortocoronary bypass graft: Secondary | ICD-10-CM

## 2016-06-03 NOTE — Telephone Encounter (Signed)
Call made to pharmacy as pt should have refill on file from 05/21/2016 #90 with 3 refills.  Pharmacy states they did not get rx-so rx was phoned into the pharmacy.Despina Hidden Cassady5/22/201811:58 AM

## 2016-06-18 ENCOUNTER — Encounter: Payer: Self-pay | Admitting: *Deleted

## 2016-06-19 ENCOUNTER — Encounter: Payer: Self-pay | Admitting: Family

## 2016-06-25 ENCOUNTER — Ambulatory Visit (INDEPENDENT_AMBULATORY_CARE_PROVIDER_SITE_OTHER): Payer: PPO | Admitting: Family

## 2016-06-25 ENCOUNTER — Ambulatory Visit (INDEPENDENT_AMBULATORY_CARE_PROVIDER_SITE_OTHER)
Admission: RE | Admit: 2016-06-25 | Discharge: 2016-06-25 | Disposition: A | Discharge status: Home / Self Care | Payer: PPO | Source: Ambulatory Visit | Attending: Family | Admitting: Family

## 2016-06-25 ENCOUNTER — Ambulatory Visit (HOSPITAL_COMMUNITY)
Admission: RE | Admit: 2016-06-25 | Discharge: 2016-06-25 | Disposition: A | Discharge status: Home / Self Care | Payer: PPO | Source: Ambulatory Visit | Attending: Family | Admitting: Family

## 2016-06-25 ENCOUNTER — Encounter: Payer: Self-pay | Admitting: Family

## 2016-06-25 VITALS — BP 142/64 | HR 54 | Temp 97.4°F | Resp 18 | Ht 60.5 in | Wt 142.5 lb

## 2016-06-25 DIAGNOSIS — I779 Disorder of arteries and arterioles, unspecified: Secondary | ICD-10-CM

## 2016-06-25 DIAGNOSIS — E1151 Type 2 diabetes mellitus with diabetic peripheral angiopathy without gangrene: Secondary | ICD-10-CM | POA: Insufficient documentation

## 2016-06-25 DIAGNOSIS — Z95828 Presence of other vascular implants and grafts: Secondary | ICD-10-CM

## 2016-06-25 DIAGNOSIS — R0989 Other specified symptoms and signs involving the circulatory and respiratory systems: Secondary | ICD-10-CM

## 2016-06-25 DIAGNOSIS — I6523 Occlusion and stenosis of bilateral carotid arteries: Secondary | ICD-10-CM | POA: Diagnosis not present

## 2016-06-25 NOTE — Patient Instructions (Signed)

## 2016-06-25 NOTE — Progress Notes (Signed)
[facility] [place]   CC: Follow up peripheral artery occlusive disease  History of Present Illness [name] is a 76 y.o. female patient of Dr. Scot Dock who had presented with progressive ischemia of the left lower extremity and significant rest pain. She had no left femoral pulse. Of note her ABI was 25% and on exam she had evidence of multilevel arterial occlusive disease.  She was taken to the peripheral vascular lab on 07/03/2014. She underwent PTA and 6 mm x 29 mm stent of the left common iliac artery for a chronic total occlusion. There was no residual stenosis.  Pt denies claudication sx's with walking, denies non healing wounds.  She has started walking a little more since her last visit, she has no barriers to walking other than likely deconditioning.  She walks for exercise about 20 minutes daily.   She reports intermittent left lateral hip pain with walking for years.   She has done well since the procedure and denies rest pain in her left foot now. He is not a smoker. She is on 81 mg of aspirin. She is on Plavix. She is on a statin.  Pt denies any hx of stroke or TIA.   Pt Diabetic: Yes, A1C was 8.0 on 05-21-16, 8.1 in September 2017 (review of records) Pt smoker: non-smoker  Pt meds include: Statin :Yes Betablocker: Yes ASA: Yes Other anticoagulants/antiplatelets: Plavix    Past Medical History:  Diagnosis Date  . CAD (coronary artery disease) 2006   s/p Quadrupal CABG 2006  . CHF (congestive heart failure) ([place]) 2010   EF 30-35% from Echo 06/2008  . CKD (chronic kidney disease) stage 4, GFR 15-29 ml/min (HCC)    fluctuating between stage 3 and 4 depending on GFR  . Diverticulosis   . DIVERTICULOSIS OF COLON 04/01/2007   Qualifier: Diagnosis of  By: Nelson-Smith CMA (AAMA), [name]    . 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 History of colonic polyps   . HLD (hyperlipidemia)   . HTN (hypertension)   . Lumbar  spinal stenosis   . MI (myocardial infarction) ([place]) 2003   . [place]  . Mouth ulcer adjacent to jaw bone 08/22/2010   S/p removal by ENT 09/2010   . MYOCARDIAL INFARCTION, HX OF 09/09/2006   Qualifier: Diagnosis of  By: Marinda Elk MD, Sonia Side    . Nephrolithiasis    hx of  . NEPHROLITHIASIS 04/01/2007   Qualifier: Diagnosis of  By: Harlon Ditty CMA (AAMA), [name]    . OA (osteoarthritis)   . Osteopenia 2010   T score of -1.8  . Psoriasis   . PVD (peripheral vascular disease) (HCC)    ABI indicated moderated reduction arterial flow on the left.  . Tricuspid regurgitation 2009   moderate to severe, EF 55%    Social History Social History  Substance Use Topics  . Smoking status: Never Smoker  . Smokeless tobacco: Never Used  . Alcohol use No    Family History Family History  Problem Relation Age of Onset  . Cirrhosis Brother   . Heart disease Brother   . Heart attack Brother   . Hypertension Mother   . Hyperlipidemia Mother   . Hyperlipidemia Son   . Hypertension Son     Past Surgical History:  Procedure Laterality Date  . CATARACT EXTRACTION  2008   left eye  . CORONARY ANGIOPLASTY WITH STENT PLACEMENT  2003  . CORONARY ARTERY BYPASS GRAFT  2006  4 vessel  . LAPAROSCOPIC SUPRACERVICAL HYSTERECTOMY    . PERIPHERAL VASCULAR CATHETERIZATION N/A 07/03/2014   Procedure: Abdominal Aortogram w/Lower Extremity;  Surgeon: Angelia Mould, MD;  Location: [facility];  Service: Cardiovascular;  Laterality: N/A;    Allergies  Allergen Reactions  . Valsartan Cough    Current Outpatient Prescriptions  Medication Sig Dispense Refill  . ACCU-CHEK FASTCLIX LANCETS MISC Check blood sugar 3 times a day 102 each 12  . amLODipine (NORVASC) 10 MG tablet Take 1 tablet (10 mg total) by mouth daily. 90 tablet 3  . aspirin EC 81 MG tablet Take 81 mg by mouth daily.    Marland Kitchen atenolol (TENORMIN) 25 MG tablet Take 0.5 tablets (12.5 mg total) by mouth daily. 45 tablet 3  . Blood  Glucose Monitoring Suppl (FREESTYLE LITE) DEVI Check blood sugar up to 4 times a day 1 each 0  . Calcium Carb-Cholecalciferol (CALCIUM 600 + D PO) Take 1 tablet by mouth 2 (two) times daily.    . clopidogrel (PLAVIX) 75 MG tablet Take 1 tablet (75 mg total) by mouth daily. 90 tablet 3  . diclofenac sodium (VOLTAREN) 1 % GEL Apply 2 g topically 4 (four) times daily. 100 g 3  . famotidine (PEPCID) 20 MG tablet TAKE 1 TABLET(20 MG) BY MOUTH TWICE DAILY 180 tablet 3  . furosemide (LASIX) 40 MG tablet Take 1 tablet (40 mg total) by mouth daily. 90 tablet 3  . glucose blood (FREESTYLE LITE) test strip 1 each by Other route 4 (four) times daily -  before meals and at bedtime. 100 each 11  . HUMIRA PEN 40 MG/0.8ML PNKT     . Insulin Pen Needle (PEN NEEDLES) 32G X 4 MM MISC 1 each by Does not apply route 4 (four) times daily -  before meals and at bedtime. 100 each 11  . insulin regular (NOVOLIN R) 250 units/2.56mL (100 units/mL) injection INJECT 16 UNITS BEFORE BREAKFAST, 16 UNITS BEFORE LUNCH AND 14 UNITS BEFORE DINNER 15 mL 11  . Insulin Syringe-Needle U-100 31G X 15/64" 0.3 ML MISC Use to inject mealtime insulin 3 times a day Dx code 250.00 insulin requiring 300 each 3  . Lancets (FREESTYLE) lancets 1 each by Other route 4 (four) times daily -  before meals and at bedtime. 100 each 11  . LANTUS SOLOSTAR 100 UNIT/ML Solostar Pen INJECT 44 UNITS INTO THE SKIN DAILY AT 10PM 45 mL 11  . nitroGLYCERIN (NITROSTAT) 0.3 MG SL tablet Place 1 tablet (0.3 mg total) under the tongue every 5 (five) minutes as needed for chest pain. 90 tablet 12  . potassium chloride (K-DUR) 10 MEQ tablet Take 1 tablet (10 mEq total) by mouth 2 (two) times daily. 180 tablet 3  . rosuvastatin (CRESTOR) 20 MG tablet Take 1 tablet (20 mg total) by mouth daily. 90 tablet 3  . traMADol (ULTRAM) 50 MG tablet Take 1 tablet (50 mg total) by mouth daily as needed. 30 tablet 2  . rosuvastatin (CRESTOR) 20 MG tablet TAKE 1 TABLET(20 MG) BY MOUTH  DAILY (Patient not taking: Reported on 06/25/2016) 90 tablet 3   No current facility-administered medications for this visit.     ROS: See HPI for pertinent positives and negatives.   Physical Examination  Vitals:   06/25/16 1057 06/25/16 1100 06/25/16 1104  BP: (!) 152/74 (!) 146/68 (!) 142/64  Pulse: (!) 54    Resp: 18    Temp: 97.4 F (36.3 C)    TempSrc: Oral  SpO2: 98%    Weight: 142 lb 8 oz (64.6 kg)    Height: 5' 0.5" (1.537 m)     Body mass index is 27.37 kg/m.  General: A&O x 3, WDWN, female. Gait: normal Eyes: PERRLA. Pulmonary: Respirations are non labored, CTAB, no wheezes , rales or rhonchi. Occasional dry cough. Cardiac: regular rhythm, no detected murmur.     Carotid Bruits Right Left   positive Negative  Aorta is not palpable. Radial pulses: 2+ palpable and =   VASCULAR EXAM: Extremitieswithout ischemic changes, without Gangrene; without open wounds.     LE Pulses Right Left   FEMORAL 2+ palpable 2+ palpable    POPLITEAL not palpable  not palpable   POSTERIOR TIBIAL not palpable  not palpable    DORSALIS PEDIS  ANTERIOR TIBIAL 2+ palpable  2+ palpable    Abdomen: soft, NT, no palpable masses. Skin: no rashes, no ulcers. Musculoskeletal: no muscle wasting or atrophy. Neurologic: A&O X 3; Appropriate Affect ; SENSATION: normal; MOTOR FUNCTION: moving all extremities equally, motor strength 5/5 throughout in upper extremities, 4/5 in LE's. Speech is fluent/normal. CN 2-12 intact   ASSESSMENT: [name] is a 76 y.o. female who is s/p stent placement in the left common iliac artery for a chronic total occlusion on 07/03/14. She no longer has claudication sx's with walking, has no signs of ischemia in her lower  extremities.   Right carotid bruit present, no history of stroke or TIA. Her primary atherosclerotic risk factor is her uncontrolled DM; other risk factors include CKD  and CAD.  Fortunately she has never used tobacco.    DATA  Carotid Duplex (06/25/16): Right ICA: <40% stenosis. Left ICA: 40-59% stenosis. Bilateral vertebral artery flow is antegrade.  Bilateral subclavian artery waveforms are normal.  No prior exam for comparison.     ABI (Date: 06/25/2016)  R:   ABI: 0.91 (0.99 on 12-25-15),   PT: mono  DP: bi  TBI:  0.58 (was 0.57)  L:   ABI: 0.72 (was 0.70),   PT: mono  DP: bi  TBI: 0.67 (was 0.66)  Right LE ABI has declined form normal to mild arterial occlusive disease, Left LE remains stable with moderate disease. Bilateral TBI remain stable.    PLAN:  Graduated walking program discussed and how to achieve.  Based on the patient's vascular studies and examination, pt will return to clinic in 1 yearwith ABI's and carotid duplex.  I advised her to notify us if she develops concerns re the circulation in her feet or legs.   I discussed in depth with the patient the nature of atherosclerosis, and emphasized the importance of maximal medical management including strict control of blood pressure, blood glucose, and lipid levels, obtaining regular exercise, and cessation of smoking.  The patient is aware that without maximal medical management the underlying atherosclerotic disease process will progress, limiting the benefit of any interventions.  The patient was given information about PAD including signs, symptoms, treatment, what symptoms should prompt the patient to seek immediate medical care, and risk reduction measures to take.  Clemon Chambers, RN, MSN, FNP-C [facility] Phone: [phone]  Clinic MD: Scot Dock  06/25/16 11:07 AM

## 2016-07-03 NOTE — Addendum Note (Signed)
Addended by: Lianne Cure A on: 07/03/2016 10:13 AM   Modules accepted: Orders

## 2016-08-25 ENCOUNTER — Other Ambulatory Visit: Payer: Self-pay | Admitting: Otolaryngology

## 2016-08-25 DIAGNOSIS — K1379 Other lesions of oral mucosa: Secondary | ICD-10-CM

## 2016-08-26 ENCOUNTER — Ambulatory Visit
Admission: RE | Admit: 2016-08-26 | Discharge: 2016-08-26 | Disposition: A | Discharge status: Home / Self Care | Payer: PPO | Source: Ambulatory Visit | Attending: Otolaryngology | Admitting: Otolaryngology

## 2016-08-26 ENCOUNTER — Other Ambulatory Visit: Payer: Self-pay | Admitting: *Deleted

## 2016-08-26 DIAGNOSIS — N183 Chronic kidney disease, stage 3 unspecified: Secondary | ICD-10-CM

## 2016-08-26 DIAGNOSIS — K1379 Other lesions of oral mucosa: Secondary | ICD-10-CM

## 2016-08-26 DIAGNOSIS — Z794 Long term (current) use of insulin: Principal | ICD-10-CM

## 2016-08-26 DIAGNOSIS — E1122 Type 2 diabetes mellitus with diabetic chronic kidney disease: Secondary | ICD-10-CM

## 2016-08-26 MED ORDER — "INSULIN SYRINGE-NEEDLE U-100 31G X 15/64"" 0.3 ML MISC": 3 refills | Status: DC

## 2016-08-26 MED ORDER — IOPAMIDOL (ISOVUE-300) INJECTION 61%
75.0000 mL | Freq: Once | INTRAVENOUS | Status: AC | PRN
  Administered 2016-08-26: 75 mL via INTRAVENOUS

## 2016-08-26 NOTE — Telephone Encounter (Signed)
Pls sch 1st available PCP DM F/U

## 2016-10-08 ENCOUNTER — Other Ambulatory Visit: Payer: Self-pay | Admitting: *Deleted

## 2016-10-08 DIAGNOSIS — I10 Essential (primary) hypertension: Secondary | ICD-10-CM

## 2016-10-08 MED ORDER — FUROSEMIDE 40 MG PO TABS: 40.0000 mg | ORAL_TABLET | Freq: Every day | ORAL | 0 refills | Status: DC

## 2016-10-15 ENCOUNTER — Encounter: Payer: PPO | Admitting: Internal Medicine

## 2016-10-22 ENCOUNTER — Ambulatory Visit (INDEPENDENT_AMBULATORY_CARE_PROVIDER_SITE_OTHER): Payer: PPO | Admitting: Internal Medicine

## 2016-10-22 ENCOUNTER — Encounter: Payer: Self-pay | Admitting: Internal Medicine

## 2016-10-22 VITALS — BP 150/55 | HR 58 | Temp 98.0°F | Wt 140.5 lb

## 2016-10-22 DIAGNOSIS — Z23 Encounter for immunization: Secondary | ICD-10-CM | POA: Diagnosis not present

## 2016-10-22 DIAGNOSIS — N183 Chronic kidney disease, stage 3 unspecified: Secondary | ICD-10-CM

## 2016-10-22 DIAGNOSIS — E785 Hyperlipidemia, unspecified: Secondary | ICD-10-CM

## 2016-10-22 DIAGNOSIS — I739 Peripheral vascular disease, unspecified: Secondary | ICD-10-CM | POA: Diagnosis not present

## 2016-10-22 DIAGNOSIS — I129 Hypertensive chronic kidney disease with stage 1 through stage 4 chronic kidney disease, or unspecified chronic kidney disease: Secondary | ICD-10-CM

## 2016-10-22 DIAGNOSIS — I5032 Chronic diastolic (congestive) heart failure: Secondary | ICD-10-CM | POA: Diagnosis not present

## 2016-10-22 DIAGNOSIS — I5030 Unspecified diastolic (congestive) heart failure: Secondary | ICD-10-CM

## 2016-10-22 DIAGNOSIS — E1122 Type 2 diabetes mellitus with diabetic chronic kidney disease: Secondary | ICD-10-CM | POA: Diagnosis not present

## 2016-10-22 DIAGNOSIS — M8580 Other specified disorders of bone density and structure, unspecified site: Secondary | ICD-10-CM | POA: Diagnosis not present

## 2016-10-22 DIAGNOSIS — E119 Type 2 diabetes mellitus without complications: Secondary | ICD-10-CM | POA: Diagnosis not present

## 2016-10-22 DIAGNOSIS — I2510 Atherosclerotic heart disease of native coronary artery without angina pectoris: Secondary | ICD-10-CM | POA: Diagnosis not present

## 2016-10-22 DIAGNOSIS — I252 Old myocardial infarction: Secondary | ICD-10-CM | POA: Diagnosis not present

## 2016-10-22 DIAGNOSIS — I10 Essential (primary) hypertension: Secondary | ICD-10-CM

## 2016-10-22 DIAGNOSIS — I130 Hypertensive heart and chronic kidney disease with heart failure and stage 1 through stage 4 chronic kidney disease, or unspecified chronic kidney disease: Secondary | ICD-10-CM

## 2016-10-22 DIAGNOSIS — K219 Gastro-esophageal reflux disease without esophagitis: Secondary | ICD-10-CM | POA: Diagnosis not present

## 2016-10-22 DIAGNOSIS — Z951 Presence of aortocoronary bypass graft: Secondary | ICD-10-CM

## 2016-10-22 DIAGNOSIS — Z794 Long term (current) use of insulin: Secondary | ICD-10-CM | POA: Diagnosis not present

## 2016-10-22 DIAGNOSIS — E118 Type 2 diabetes mellitus with unspecified complications: Secondary | ICD-10-CM

## 2016-10-22 LAB — GLUCOSE, CAPILLARY: GLUCOSE-CAPILLARY: 103 mg/dL — AB (ref 65–99)

## 2016-10-22 LAB — POCT GLYCOSYLATED HEMOGLOBIN (HGB A1C): HEMOGLOBIN A1C: 6.9

## 2016-10-22 MED ORDER — POTASSIUM CHLORIDE ER 10 MEQ PO TBCR: 10.0000 meq | EXTENDED_RELEASE_TABLET | Freq: Two times a day (BID) | ORAL | 3 refills | Status: DC

## 2016-10-22 MED ORDER — ROSUVASTATIN CALCIUM 20 MG PO TABS: ORAL_TABLET | ORAL | 3 refills | Status: DC

## 2016-10-22 MED ORDER — AMLODIPINE BESYLATE 10 MG PO TABS: 10.0000 mg | ORAL_TABLET | Freq: Every day | ORAL | 3 refills | Status: DC

## 2016-10-22 MED ORDER — FAMOTIDINE 20 MG PO TABS: ORAL_TABLET | ORAL | 3 refills | Status: DC

## 2016-10-22 MED ORDER — INSULIN GLARGINE 100 UNIT/ML SOLOSTAR PEN: PEN_INJECTOR | SUBCUTANEOUS | 11 refills | Status: DC

## 2016-10-22 MED ORDER — FUROSEMIDE 40 MG PO TABS: 40.0000 mg | ORAL_TABLET | Freq: Every day | ORAL | 0 refills | Status: DC

## 2016-10-22 MED ORDER — CLOPIDOGREL BISULFATE 75 MG PO TABS: 75.0000 mg | ORAL_TABLET | Freq: Every day | ORAL | 3 refills | Status: DC

## 2016-10-22 MED ORDER — ATENOLOL 25 MG PO TABS: 12.5000 mg | ORAL_TABLET | Freq: Every day | ORAL | 3 refills | Status: DC

## 2016-10-22 NOTE — Assessment & Plan Note (Signed)
Assessment: BP 150/55 today. Patient advised to continue her current medications and that we would further discuss this at a later date. She stated that she typically runs a slightly lower blood pressure and believed it to be elevated today secondary to anxiety over meeting her doctor.  Plan: Continue her current medication regimen.

## 2016-10-22 NOTE — Progress Notes (Signed)
CC: Routine visit  HPI:  [name] is a pleasant 76 y.o. female who presents today for a routine clinic follow-up. She states that she is not currently experiencing any pain, shortness of breath, fatigue, changes in urination, or defication. As part of her routine visit she desires the Pneumovax and flu vaccine today if amenable. The patient stated that her current health was "good" although she has not been walking recently as she has been "lazy". She was not concerned with any recent events, would like to have her medications refilled today for her CHF, CKDIII, DMII, and hypertension. She informed the MD that her blood pressure is often elevated when in the presence of a doctor. She has been compliant with her medication regimen taking 14 units of novolog at breakfast and 16 units at noon and in the evening.   The patient provided a copy of her most recent CBG recordings from her glucose monitor for review. She is accompanied by her friend of several years.   Past Medical History:  Diagnosis Date  . CAD (coronary artery disease) 2006   s/p Quadrupal CABG 2006  . CHF (congestive heart failure) ([place]) 2010   EF 30-35% from Echo 06/2008  . CKD (chronic kidney disease) stage 4, GFR 15-29 ml/min (HCC)    fluctuating between stage 3 and 4 depending on GFR  . Diverticulosis   . DIVERTICULOSIS OF COLON 04/01/2007   Qualifier: Diagnosis of  By: Nelson-Smith CMA (AAMA), [name]    . 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 History of colonic polyps   . HLD (hyperlipidemia)   . HTN (hypertension)   . Lumbar spinal stenosis   . MI (myocardial infarction) ([place]) 2003   . [place]  . Mouth ulcer adjacent to jaw bone 08/22/2010   S/p removal by ENT 09/2010   . MYOCARDIAL INFARCTION, HX OF 09/09/2006   Qualifier: Diagnosis of  By: Marinda Elk MD, Sonia Side    . Nephrolithiasis    hx of  . NEPHROLITHIASIS 04/01/2007   Qualifier: Diagnosis of  By: Harlon Ditty CMA (AAMA), [name]    .  OA (osteoarthritis)   . Osteopenia 2010   T score of -1.8  . Psoriasis   . PVD (peripheral vascular disease) (HCC)    ABI indicated moderated reduction arterial flow on the left.  . Tricuspid regurgitation 2009   moderate to severe, EF 55%   Review of Systems:  ROS Constitutional: Patient denies weight loss, weakness, fatigue, diaphoresis HEENT: Patient denied visual changes, blurred vision, eye pain  sore throat, stridor, cardiovascular: Patient denied chest pain, palpations, or worsening swelling of her legs Pulmonary: Patient denies cough, shortness of breath, sputum production GI: Patient denied heartburn, nausea, vomiting, abdominal pain Musculoskeletal: Patient denied muscle aches or pain Neurological: Patient denies dizziness, headaches, tingling  Physical Exam:  Vitals:   10/22/16 1533  BP: (!) 150/55  Pulse: (!) 58  Temp: 98 F (36.7 C)  TempSrc: Oral  SpO2: 98%  Weight: 140 lb 8 oz (63.7 kg)   Physical Exam  Nursing note and vitals reviewed. Constitutional: Patient is well-developed, well-nourished, no acute distress HEENT, extraocular movements intact, conjunctiva normal, without scleral icterus CV: Regular rate and rhythm, no murmur appreciated Pulmonary: Breath sounds normal without wheezes Abdomen: Soft, nontender, nondistended, with normal bowel sounds Musculoskeletal: Bilateral nonpitting edema of the feet and ankles, neck tenderness to palpation Psychiatric: Mood and affect normal  Assessment & Plan:   See Encounters Tab for problem based  charting.  Patient seen with Dr. Eppie Gibson

## 2016-10-22 NOTE — Patient Instructions (Addendum)
Thank you for your visit to the [facility] today.  Your prescriptions have been revealed, but if you observe that any were missed, please call and we will fill them.  You received the Pneumovax vaccine and the flu vaccine today.  Your hemoglobin A1c has greatly improved. I would like for you to continue regulating your dietary intake as you have been. Please attempt to maintain a regular diet with each meal.  In addition, we discussed your diabetes, chronic kidney disease, heart failure and osteopenia.

## 2016-10-22 NOTE — Assessment & Plan Note (Addendum)
Assessment: Most recent DEXA scan shows increased risk of hip fracture greater than 3% indicating a probable need for bisphosphonate. Have evaluated the patient's medications for risk of worsening of this condition and at this time I believe her current regimen to be acceptable.  Plan: Discussed the patient's vitamin D findings 38, will further discuss the need for bisphosphonates on subsequent visit

## 2016-10-22 NOTE — Assessment & Plan Note (Addendum)
Assessment: Patient denied SOB, denied fatigue with regular activity and denied worsening ankle edema. Most recent Echo was in 12/2014 indicating grade one diastolic heart failure.   Plan: continue to monitor and reconsider Echo if symptoms worsen

## 2016-10-22 NOTE — Assessment & Plan Note (Addendum)
Assessment: Patient provided a copy of her CG B levels as per her machine over the past 6 weeks. This demonstrated a tight pattern for her a.m. Preprandial measurements, a broad pattern ranging from 82-375 at noon, and a slightly better pattern her p.m. Meal.  HgbA1c 6.9% down from the previous 8.0%  Plan: Will continue the current insulin and dietary modifications given her A1c, with guidance on maintaining a more regular diet and carb counting to better control her noon BG levels. The patient agreed to consider speaking with the dietician at a future visit.

## 2016-10-24 NOTE — Progress Notes (Signed)
Patient ID: [name], female   DOB: 08-11-1940, 76 y.o.   MRN: [id number]  I saw and evaluated the patient.  I personally confirmed the key portions of Dr. Nelma Rothman history and exam and reviewed pertinent patient test results.  The assessment, diagnosis, and plan were formulated together and I agree with the documentation in the resident's note.

## 2016-10-28 ENCOUNTER — Other Ambulatory Visit: Payer: Self-pay | Admitting: *Deleted

## 2016-10-28 DIAGNOSIS — Z794 Long term (current) use of insulin: Principal | ICD-10-CM

## 2016-10-28 DIAGNOSIS — N183 Chronic kidney disease, stage 3 unspecified: Secondary | ICD-10-CM

## 2016-10-28 DIAGNOSIS — E1122 Type 2 diabetes mellitus with diabetic chronic kidney disease: Secondary | ICD-10-CM

## 2016-10-28 MED ORDER — GLUCOSE BLOOD VI STRP: 1.0000 | ORAL_STRIP | Freq: Three times a day (TID) | 4 refills | Status: DC

## 2016-11-20 ENCOUNTER — Ambulatory Visit: Payer: PPO | Admitting: Internal Medicine

## 2016-11-20 ENCOUNTER — Telehealth: Payer: Self-pay | Admitting: *Deleted

## 2016-11-20 DIAGNOSIS — M79605 Pain in left leg: Secondary | ICD-10-CM

## 2016-11-20 DIAGNOSIS — Z7982 Long term (current) use of aspirin: Secondary | ICD-10-CM

## 2016-11-20 DIAGNOSIS — Z7902 Long term (current) use of antithrombotics/antiplatelets: Secondary | ICD-10-CM

## 2016-11-20 DIAGNOSIS — I739 Peripheral vascular disease, unspecified: Secondary | ICD-10-CM | POA: Diagnosis not present

## 2016-11-20 NOTE — Telephone Encounter (Signed)
[name] at [facility] who wants to refer patient to be seen in [facility] office. Patient seen at [facility] today and had ABI's done for c/o left leg pain and swelling. [name] is Vascular lab recommended patient get Duplex doppler studies done.[name] said she would schedule this for patient, (call time to patient) notify us of the date and fax today's office notes. Plan to schedule appointment with Vinnie Level to follow.

## 2016-11-20 NOTE — Addendum Note (Signed)
Addended by: [place] Lions on: 11/20/2016 04:28 PM   Modules accepted: Orders

## 2016-11-20 NOTE — Progress Notes (Signed)
   CC: Left leg pain  HPI:  [name] is a 76 y.o. female with a past medical history listed below here today with complaints of left leg pain.  Reports that she developed left popliteal and calf pain acutely 2 weeks ago. Denies any falls or trauma. Describes the pain as an achy pain. Pain occurs at rest but is much worse with ambulation. She is unable to ambulate currently secondary to severe pain. Reports some swelling in her left leg but reports it is chronic. No wounds or ulcers. Denies any weakness, numbness, tingling. Reports compliance with her ASA and Plavix. Reports never experiencing any similar pain in the past.   Past Medical History:  Diagnosis Date  . CAD (coronary artery disease) 2006   s/p Quadrupal CABG 2006  . CHF (congestive heart failure) ([place]) 2010   EF 30-35% from Echo 06/2008  . CKD (chronic kidney disease) stage 4, GFR 15-29 ml/min (HCC)    fluctuating between stage 3 and 4 depending on GFR  . Diverticulosis   . DIVERTICULOSIS OF COLON 04/01/2007   Qualifier: Diagnosis of  By: Nelson-Smith CMA (AAMA), [name]    . 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 History of colonic polyps   . HLD (hyperlipidemia)   . HTN (hypertension)   . Lumbar spinal stenosis   . MI (myocardial infarction) ([place]) 2003   . [place]  . Mouth ulcer adjacent to jaw bone 08/22/2010   S/p removal by ENT 09/2010   . MYOCARDIAL INFARCTION, HX OF 09/09/2006   Qualifier: Diagnosis of  By: Marinda Elk MD, Sonia Side    . Nephrolithiasis    hx of  . NEPHROLITHIASIS 04/01/2007   Qualifier: Diagnosis of  By: Harlon Ditty CMA (AAMA), [name]    . OA (osteoarthritis)   . Osteopenia 2010   T score of -1.8  . Psoriasis   . PVD (peripheral vascular disease) (HCC)    ABI indicated moderated reduction arterial flow on the left.  . Tricuspid regurgitation 2009   moderate to severe, EF 55%   Review of Systems:   Negative except as noted in HPI  Physical Exam:  Vitals:   11/20/16  1315  BP: (!) 141/57  Pulse: (!) 54  Temp: 97.6 F (36.4 C)  TempSrc: Oral  SpO2: 98%  Weight: 138 lb 3.2 oz (62.7 kg)  Height: 5\' 5"  (1.651 m)    GENERAL- alert, co-operative, appears as stated age, not in any distress. CARDIAC- RRR, no murmurs, rubs or gallops. RESP- Moving equal volumes of air, and clear to auscultation bilaterally, no wheezes or crackles. NEURO- Strength and sensation intact throughout EXTREMITIES- Unable to palpate peripheral pulses, faint pulse by doppler. Bilateral legs warm. No tenderness to palpation. 1+ edema to ankles bilaterally and symmetric.  SKIN - Warm. No wounds or ulcers. PSYCH- Normal mood and affect, appropriate thought content and speech.   Assessment & Plan:   See Encounters Tab for problem based charting.  Patient discussed with Dr. Evette Doffing

## 2016-11-20 NOTE — Assessment & Plan Note (Signed)
Patient with 2 week history of left leg pain in the posterior aspect of her left leg in the popliteal and calf area.   Concerning given her PAD history. Able to get weak doppler pulse on left. ABI in clinic today 0.99 bilaterally though question this result given her 0.72 left ABI in June of this year. She is having claudication symptoms with improvement with rest but concerning as she is having symptoms at rest as well.  Called her vascular surgeon's office who report they will contact the patient directly and have her seen urgently.

## 2016-11-21 NOTE — Progress Notes (Signed)
Internal Medicine Clinic Attending  Case discussed with Dr. Boswell at the time of the visit.  We reviewed the resident's history and exam and pertinent patient test results.  I agree with the assessment, diagnosis, and plan of care documented in the resident's note.  

## 2016-11-21 NOTE — Addendum Note (Signed)
Addended by: [place] Lions on: 11/21/2016 10:56 AM   Modules accepted: Orders

## 2016-11-27 ENCOUNTER — Other Ambulatory Visit: Payer: Self-pay | Admitting: Family

## 2016-11-27 ENCOUNTER — Ambulatory Visit (INDEPENDENT_AMBULATORY_CARE_PROVIDER_SITE_OTHER)
Admission: RE | Admit: 2016-11-27 | Discharge: 2016-11-27 | Disposition: A | Discharge status: Home / Self Care | Payer: PPO | Source: Ambulatory Visit | Attending: Family | Admitting: Family

## 2016-11-27 ENCOUNTER — Ambulatory Visit (HOSPITAL_COMMUNITY)
Admission: RE | Admit: 2016-11-27 | Discharge: 2016-11-27 | Disposition: A | Discharge status: Home / Self Care | Payer: PPO | Source: Ambulatory Visit | Attending: Student in an Organized Health Care Education/Training Program | Admitting: Student in an Organized Health Care Education/Training Program

## 2016-11-27 ENCOUNTER — Encounter: Payer: Self-pay | Admitting: Family

## 2016-11-27 ENCOUNTER — Ambulatory Visit: Payer: PPO | Admitting: Family

## 2016-11-27 VITALS — BP 162/73 | HR 96 | Temp 97.1°F | Resp 19 | Wt 137.1 lb

## 2016-11-27 DIAGNOSIS — I70202 Unspecified atherosclerosis of native arteries of extremities, left leg: Secondary | ICD-10-CM | POA: Insufficient documentation

## 2016-11-27 DIAGNOSIS — I6523 Occlusion and stenosis of bilateral carotid arteries: Secondary | ICD-10-CM

## 2016-11-27 DIAGNOSIS — Z95828 Presence of other vascular implants and grafts: Secondary | ICD-10-CM | POA: Diagnosis not present

## 2016-11-27 DIAGNOSIS — M7989 Other specified soft tissue disorders: Secondary | ICD-10-CM | POA: Insufficient documentation

## 2016-11-27 DIAGNOSIS — I779 Disorder of arteries and arterioles, unspecified: Secondary | ICD-10-CM

## 2016-11-27 DIAGNOSIS — M79662 Pain in left lower leg: Secondary | ICD-10-CM

## 2016-11-27 DIAGNOSIS — E1151 Type 2 diabetes mellitus with diabetic peripheral angiopathy without gangrene: Secondary | ICD-10-CM

## 2016-11-27 DIAGNOSIS — I83812 Varicose veins of left lower extremities with pain: Secondary | ICD-10-CM | POA: Diagnosis not present

## 2016-11-27 DIAGNOSIS — M79605 Pain in left leg: Secondary | ICD-10-CM | POA: Insufficient documentation

## 2016-11-27 LAB — VAS US LOWER EXTREMITY ARTERIAL DUPLEX
LEFT PERO DIST SYS: 53 cm/s
LSFDPSV: -45 cm/s
LSFMPSV: -162 cm/s
Left ant tibial distal sys: 74 cm/s
Left super femoral prox sys PSV: 210 cm/s
left post tibial dist sys: 11 cm/s

## 2016-11-27 NOTE — Patient Instructions (Addendum)
Before your next abdominal ultrasound:  Take two Extra-Strength Gas-X capsules at bedtime the night before the test. Take another two Extra-Strength Gas-X capsules 3 hours before the test.  Avoid gas forming foods the day before the test.        Peripheral Vascular Disease Peripheral vascular disease (PVD) is a disease of the blood vessels that are not part of your heart and brain. A simple term for PVD is poor circulation. In most cases, PVD narrows the blood vessels that carry blood from your heart to the rest of your body. This can result in a decreased supply of blood to your arms, legs, and internal organs, like your stomach or kidneys. However, it most often affects a person's lower legs and feet. There are two types of PVD.  Organic PVD. This is the more common type. It is caused by damage to the structure of blood vessels.  Functional PVD. This is caused by conditions that make blood vessels contract and tighten (spasm).  Without treatment, PVD tends to get worse over time. PVD can also lead to acute ischemic limb. This is when an arm or limb suddenly has trouble getting enough blood. This is a medical emergency. Follow these instructions at home:  Take medicines only as told by your doctor.  Do not use any tobacco products, including cigarettes, chewing tobacco, or electronic cigarettes. If you need help quitting, ask your doctor.  Lose weight if you are overweight, and maintain a healthy weight as told by your doctor.  Eat a diet that is low in fat and cholesterol. If you need help, ask your doctor.  Exercise regularly. Ask your doctor for some good activities for you.  Take good care of your feet. ? Wear comfortable shoes that fit well. ? Check your feet often for any cuts or sores. Contact a doctor if:  You have cramps in your legs while walking.  You have leg pain when you are at rest.  You have coldness in a leg or foot.  Your skin changes.  You are unable to  get or have an erection (erectile dysfunction).  You have cuts or sores on your feet that are not healing. Get help right away if:  Your arm or leg turns cold and blue.  Your arms or legs become red, warm, swollen, painful, or numb.  You have chest pain or trouble breathing.  You suddenly have weakness in your face, arm, or leg.  You become very confused or you cannot speak.  You suddenly have a very bad headache.  You suddenly cannot see. This information is not intended to replace advice given to you by your health care provider. Make sure you discuss any questions you have with your health care provider. Document Released: 03/26/2009 Document Revised: 06/07/2015 Document Reviewed: 06/09/2013 Elsevier Interactive Patient Education  2017 [employer].     Stroke Prevention Some health problems and behaviors may make it more likely for you to have a stroke. Below are ways to lessen your risk of having a stroke.  Be active for at least 30 minutes on most or all days.  Do not smoke. Try not to be around others who smoke.  Do not drink too much alcohol. ? Do not have more than 2 drinks a day if you are a man. ? Do not have more than 1 drink a day if you are a woman and are not pregnant.  Eat healthy foods, such as fruits and vegetables. If you were  put on a specific diet, follow the diet as told.  Keep your cholesterol levels under control through diet and medicines. Look for foods that are low in saturated fat, trans fat, cholesterol, and are high in fiber.  If you have diabetes, follow all diet plans and take your medicine as told.  Ask your doctor if you need treatment to lower your blood pressure. If you have high blood pressure (hypertension), follow all diet plans and take your medicine as told by your doctor.  If you are 58-55 years old, have your blood pressure checked every 3-5 years. If you are age 71 or older, have your blood pressure checked every year.  Keep a  healthy weight. Eat foods that are low in calories, salt, saturated fat, trans fat, and cholesterol.  Do not take drugs.  Avoid birth control pills, if this applies. Talk to your doctor about the risks of taking birth control pills.  Talk to your doctor if you have sleep problems (sleep apnea).  Take all medicine as told by your doctor. ? You may be told to take aspirin or blood thinner medicine. Take this medicine as told by your doctor. ? Understand your medicine instructions.  Make sure any other conditions you have are being taken care of.  Get help right away if:  You suddenly lose feeling (you feel numb) or have weakness in your face, arm, or leg.  Your face or eyelid hangs down to one side.  You suddenly feel confused.  You have trouble talking (aphasia) or understanding what people are saying.  You suddenly have trouble seeing in one or both eyes.  You suddenly have trouble walking.  You are dizzy.  You lose your balance or your movements are clumsy (uncoordinated).  You suddenly have a very bad headache and you do not know the cause.  You have new chest pain.  Your heart feels like it is fluttering or skipping a beat (irregular heartbeat). Do not wait to see if the symptoms above go away. Get help right away. Call your local emergency services (911 in [place] Do not drive yourself to the hospital. This information is not intended to replace advice given to you by your health care provider. Make sure you discuss any questions you have with your health care provider. Document Released: 07/01/2011 Document Revised: 06/07/2015 Document Reviewed: 07/02/2012 Elsevier Interactive Patient Education  [employer].

## 2016-11-27 NOTE — Progress Notes (Signed)
[facility] [place]   CC: Follow up peripheral artery occlusive disease  History of Present Illness [name] is a 76 y.o. female patient of Dr. Scot Dock who had presented with progressive ischemia of the left lower extremity and significant rest pain. She had no left femoral pulse. Of note her ABI was 25% and on exam she had evidence of multilevel arterial occlusive disease.  She was taken to the peripheral vascular lab on 07/03/2014. She underwent PTA and 6 mm x 29 mm stent of the left common iliac artery for a chronic total occlusion. There was no residual stenosis.  Pt returns today after received phone call on 11-20-16 from [facility] at [facility] who wants to refer patient to be seen in [facility] office. Patient seen at [facility] that day and had ABI's done (were 0.99 bilaterally, review of records) for c/o left leg pain and swelling. [name] in Vascular lab recommended patient get Duplex doppler studies done.[name] said she would schedule this for patient. Her left calf started hurting in October 2018, has been getting worse, left calf hurts after walking 40 yards to her mailbox, pain does not resolve with resting, does resolve with propping up left leg. Pain in left calf continues on standing. There is mild swelling in her left calf and ankle.  She denies any dyspnea.    Pt denies any hx of stroke or TIA.   Pt Diabetic: Yes, A1C was 6.9 on 10-22-16 (review of records) Pt smoker: non-smoker  Pt meds include: Statin :Yes Betablocker: Yes ASA: Yes Other anticoagulants/antiplatelets: Plavix     Past Medical History:  Diagnosis Date  . CAD (coronary artery disease) 2006   s/p Quadrupal CABG 2006  . CHF (congestive heart failure) ([place]) 2010   EF 30-35% from Echo 06/2008  . CKD (chronic kidney disease) stage 4, GFR 15-29 ml/min (HCC)    fluctuating between stage 3 and 4 depending on GFR  . Diverticulosis   . DIVERTICULOSIS OF COLON 04/01/2007    Qualifier: Diagnosis of  By: Nelson-Smith CMA (AAMA), [name]    . 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 History of colonic polyps   . HLD (hyperlipidemia)   . HTN (hypertension)   . Lumbar spinal stenosis   . MI (myocardial infarction) ([place]) 2003   . [place]  . Mouth ulcer adjacent to jaw bone 08/22/2010   S/p removal by ENT 09/2010   . MYOCARDIAL INFARCTION, HX OF 09/09/2006   Qualifier: Diagnosis of  By: Marinda Elk MD, Sonia Side    . Nephrolithiasis    hx of  . NEPHROLITHIASIS 04/01/2007   Qualifier: Diagnosis of  By: Harlon Ditty CMA (AAMA), [name]    . OA (osteoarthritis)   . Osteopenia 2010   T score of -1.8  . Psoriasis   . PVD (peripheral vascular disease) (HCC)    ABI indicated moderated reduction arterial flow on the left.  . Tricuspid regurgitation 2009   moderate to severe, EF 55%    Social History Social History   Tobacco Use  . Smoking status: Never Smoker  . Smokeless tobacco: Never Used  Substance Use Topics  . Alcohol use: No    Alcohol/week: 0.0 oz  . Drug use: No    Family History Family History  Problem Relation Age of Onset  . Cirrhosis Brother   . Heart disease Brother   . Heart attack Brother   . Hypertension Mother   . Hyperlipidemia Mother   .  Hyperlipidemia Son   . Hypertension Son     Past Surgical History:  Procedure Laterality Date  . CATARACT EXTRACTION  2008   left eye  . CORONARY ANGIOPLASTY WITH STENT PLACEMENT  2003  . CORONARY ARTERY BYPASS GRAFT  2006   4 vessel  . LAPAROSCOPIC SUPRACERVICAL HYSTERECTOMY    . PERIPHERAL VASCULAR CATHETERIZATION N/A 07/03/2014   Procedure: Abdominal Aortogram w/Lower Extremity;  Surgeon: Angelia Mould, MD;  Location: [facility];  Service: Cardiovascular;  Laterality: N/A;    Allergies  Allergen Reactions  . Valsartan Cough    Current Outpatient Medications  Medication Sig Dispense Refill  . ACCU-CHEK FASTCLIX LANCETS MISC Check blood sugar 3 times a day 102  each 12  . amLODipine (NORVASC) 10 MG tablet Take 1 tablet (10 mg total) by mouth daily. 90 tablet 3  . aspirin EC 81 MG tablet Take 81 mg by mouth daily.    Marland Kitchen atenolol (TENORMIN) 25 MG tablet Take 0.5 tablets (12.5 mg total) by mouth daily. 45 tablet 3  . B-D INS SYR HALF-UNIT .3CC/31G 31G X 5/16" 0.3 ML MISC     . Blood Glucose Monitoring Suppl (FREESTYLE LITE) DEVI Check blood sugar up to 4 times a day 1 each 0  . Calcium Carb-Cholecalciferol (CALCIUM 600 + D PO) Take 1 tablet by mouth 2 (two) times daily.    . clopidogrel (PLAVIX) 75 MG tablet Take 1 tablet (75 mg total) by mouth daily. 90 tablet 3  . diclofenac sodium (VOLTAREN) 1 % GEL Apply 2 g topically 4 (four) times daily. 100 g 3  . famotidine (PEPCID) 20 MG tablet TAKE 1 TABLET(20 MG) BY MOUTH TWICE DAILY 180 tablet 3  . furosemide (LASIX) 40 MG tablet Take 1 tablet (40 mg total) by mouth daily. 90 tablet 0  . glucose blood (FREESTYLE LITE) test strip 1 each by Other route 4 (four) times daily -  before meals and at bedtime. Diagnosis code E11.8, Z79.4 300 each 4  . HUMIRA PEN 40 MG/0.8ML PNKT     . Insulin Glargine (LANTUS SOLOSTAR) 100 UNIT/ML Solostar Pen INJECT 44 UNITS INTO THE SKIN DAILY AT 10PM 45 mL 11  . Insulin Pen Needle (PEN NEEDLES) 32G X 4 MM MISC 1 each by Does not apply route 4 (four) times daily -  before meals and at bedtime. 100 each 11  . insulin regular (NOVOLIN R) 100 units/mL injection     . insulin regular (NOVOLIN R) 250 units/2.32mL (100 units/mL) injection INJECT 16 UNITS BEFORE BREAKFAST, 16 UNITS BEFORE LUNCH AND 14 UNITS BEFORE DINNER 15 mL 11  . Insulin Syringe-Needle U-100 (BD VEO INSULIN SYR ULTRAFINE) 31G X 15/64" 0.3 ML MISC U TO INJ MEALTIME INSULIN TID    . Insulin Syringe-Needle U-100 31G X 15/64" 0.3 ML MISC Use to inject mealtime insulin 3 times a day Dx code 250.00 insulin requiring 300 each 3  . Lancets (FREESTYLE) lancets 1 each by Other route 4 (four) times daily -  before meals and at  bedtime. 100 each 11  . nitroGLYCERIN (NITROSTAT) 0.3 MG SL tablet Place 1 tablet (0.3 mg total) under the tongue every 5 (five) minutes as needed for chest pain. 90 tablet 12  . potassium chloride (K-DUR) 10 MEQ tablet Take 1 tablet (10 mEq total) by mouth 2 (two) times daily. 180 tablet 3  . rosuvastatin (CRESTOR) 20 MG tablet TAKE 1 TABLET(20 MG) BY MOUTH DAILY 90 tablet 3  . traMADol (ULTRAM) 50 MG  tablet Take 1 tablet (50 mg total) by mouth daily as needed. 30 tablet 2   No current facility-administered medications for this visit.     ROS: See HPI for pertinent positives and negatives.   Physical Examination  Vitals:   11/27/16 1350 11/27/16 1352  BP: (!) 160/78 (!) 162/73  Pulse: 96   Resp: 19   Temp: (!) 97.1 F (36.2 C)   TempSrc: Oral   SpO2: 96%   Weight: 137 lb 1.6 oz (62.2 kg)    Body mass index is 22.81 kg/m.  General: A&O x 3, WDWN, female. Gait: normal Eyes: PERRLA. Pulmonary: Respirations are non labored, CTAB, no wheezes, rales, or rhonchi.  Cardiac: regular rhythm, no detected murmur.     Carotid Bruits Right Left   negative Negative  Aorta is not palpable. Radial pulses: 2+ palpable and =   VASCULAR EXAM: Extremitieswithout ischemic changes, without Gangrene; without open wounds. Trace to 1+ non pitting edema in left lower leg and ankle. Mild tenderness to moderate palpation of left calf, no erythema.       LE Pulses Right Left   FEMORAL 2+ palpable 2+ palpable    POPLITEAL not palpable  not palpable   POSTERIOR TIBIAL not palpable  not palpable    DORSALIS PEDIS  ANTERIOR TIBIAL 2+ palpable  not palpable    Abdomen: soft, NT, no palpable masses. Skin: no rashes, no ulcers. Musculoskeletal: no muscle wasting or  atrophy. Neurologic: A&O X 3; Appropriate Affect ; SENSATION: normal; MOTOR FUNCTION: moving all extremities equally, motor strength 5/5 throughout in upper extremities, 4/5 in LE's. Speech is fluent/normal. CN 2-12 intact.     ASSESSMENT: GIANNI FUCHS is a 76 y.o. female who is s/p stent placement in the left common iliac artery for a chronic total occlusion on 07/03/14.  She returns today with c/o 1 month hx of worsening left calf pain with standing, worse with walking, relieved by elevating her left leg.   No history of stroke or TIA. Her primary atherosclerotic risk factor is DM; other risk factors include CKD, and CAD.  Fortunately she has never used tobacco.   No left lower extremity DVT or SVT today. She does have varicosities noted on venous duplex that are the likely source of pain in her calf the longer she stands;  I advised knee high graduated compression hose to help with these symptoms.   I discussed with Dr. Scot Dock that pt had 99% bilateral ABI on 11-20-16, that her DVT duplex was negative for DVT, that her bilateral femoral pulses are palpable, and that she had a 519 cm/s velocity at the left proximal SFA. Pt has more than adequate collateral perfusion around the stenosis to achieved a 99% ABI on 11-20-16.  Dr. Scot Dock advised pt to perform graduated walking program, return in 6 months to evaluate left aortoiliac arteries (s/p left CIA stent with PTA on 07-03-14).  DATA  Left LE DVT duplex (11/27/16): Negative for DVT, and negative for superficial venous thrombosis. Varicosities noted in the left popliteal fossa and calf. Left profunda and tibial veins appear dilated, without thrombus visualized.   Left LE Arterial Duplex (11/27/16): Velocities in the proximal to mid segment of the left SFA suggest 50-74% stenosis, with diffuse plaque throughout the vessel. Minimal color flow with low velocities in the left posterior tibial artery with visualized collaterals  suggest occlusion or near occlusion (519 cm/s).  Left anterior tibial artery and peroneal artery appear patent..     Carotid Duplex (  06/25/16): Right ICA: <40% stenosis. Left ICA: 40-59% stenosis. Bilateral vertebral artery flow is antegrade.  Bilateral subclavian artery waveforms are normal.  No prior exam for comparison.     ABI (Date: 06/25/2016):  R:  ? ABI: 0.91 (0.99 on 12-25-15),  ? PT: mono ? DP: bi ? TBI:  0.58 (was 0.57)  L:  ? ABI: 0.72 (was 0.70),  ? PT: mono ? DP: bi ? TBI: 0.67 (was 0.66) ? Right LE ABI has declined form normal to mild arterial occlusive disease, Left LE remains stable with moderate disease. Bilateral TBI remain stable.      PLAN:  Graduated walking program discussed and how to achieve.  Based on the patient's vascular studies and examination, pt will return to clinic in 6 monthswith ABI's, left aortoiliac arterial duplex, and carotid duplex.  I advised her to notify us if she develops concerns re the circulation in her feet or legs.   I discussed in depth with the patient the nature of atherosclerosis, and emphasized the importance of maximal medical management including strict control of blood pressure, blood glucose, and lipid levels, obtaining regular exercise, and continued cessation of smoking.  The patient is aware that without maximal medical management the underlying atherosclerotic disease process will progress, limiting the benefit of any interventions.  The patient was given information about PAD including signs, symptoms, treatment, what symptoms should prompt the patient to seek immediate medical care, and risk reduction measures to take.  Clemon Chambers, RN, MSN, FNP-C [facility] Phone: [phone]  Clinic MD: Scot Dock  11/27/16 2:01 PM

## 2016-12-01 ENCOUNTER — Ambulatory Visit: Payer: PPO | Admitting: Family

## 2016-12-01 NOTE — Addendum Note (Signed)
Addended by: Lianne Cure A on: 12/01/2016 10:16 AM   Modules accepted: Orders

## 2016-12-11 ENCOUNTER — Other Ambulatory Visit: Payer: Self-pay

## 2016-12-11 ENCOUNTER — Encounter: Payer: Self-pay | Admitting: Family Medicine

## 2016-12-11 ENCOUNTER — Ambulatory Visit (INDEPENDENT_AMBULATORY_CARE_PROVIDER_SITE_OTHER): Payer: PPO | Admitting: Family Medicine

## 2016-12-11 VITALS — BP 133/76 | HR 65 | Temp 98.2°F | Resp 16 | Ht 65.0 in | Wt 137.0 lb

## 2016-12-11 DIAGNOSIS — R05 Cough: Secondary | ICD-10-CM | POA: Diagnosis not present

## 2016-12-11 DIAGNOSIS — R058 Other specified cough: Secondary | ICD-10-CM

## 2016-12-11 MED ORDER — HYDROCODONE-HOMATROPINE 5-1.5 MG/5ML PO SYRP: 5.0000 mL | ORAL_SOLUTION | ORAL | 0 refills | Status: DC | PRN

## 2016-12-11 MED ORDER — PREDNISONE 20 MG PO TABS: ORAL_TABLET | ORAL | 0 refills | Status: DC

## 2016-12-11 MED ORDER — BENZONATATE 100 MG PO CAPS: 100.0000 mg | ORAL_CAPSULE | Freq: Three times a day (TID) | ORAL | 0 refills | Status: DC | PRN

## 2016-12-11 MED ORDER — AZITHROMYCIN 250 MG PO TABS: ORAL_TABLET | ORAL | 0 refills | Status: DC

## 2016-12-11 NOTE — Patient Instructions (Addendum)
Drink plenty of fluids and get enough rest  Take the benzonatate cough pills 1 or 2 pills 3 times daily as needed for daytime cough  Take the Hycodan cough syrup 1 teaspoon every 4-6 hours as needed for nighttime cough  Take the prednisone 20 mg 3 pills of the first morning, then to the next morning and one the third morning.  Best taken after breakfast.  Take the azithromycin 2 pills initially, then 1 daily for 4 days for antibiotic  If you are not improving over the next week or 10 days return for reassessment and a probable chest x-ray.  If your sugar goes up over the next few days on the prednisone, you may need to take a little extra insulin as discussed.    IF you received an x-ray today, you will receive an invoice from [facility]. Please contact [facility] at [phone] with questions or concerns regarding your invoice.   IF you received labwork today, you will receive an invoice from [employer]. Please contact [facility] at [phone] with questions or concerns regarding your invoice.   Our billing staff will not be able to assist you with questions regarding bills from these companies.  You will be contacted with the lab results as soon as they are available. The fastest way to get your results is to activate your My Chart account. Instructions are located on the last page of this paperwork. If you have not heard from [place] regarding the results in 2 weeks, please contact this office.

## 2016-12-11 NOTE — Progress Notes (Signed)
Patient ID: [name], female    DOB: November 22, 1940  Age: 76 y.o. MRN: [id number]  Chief Complaint  Patient presents with  . URI    cough, yellowish phelgm since x 3 weeks     Subjective:   76 year old lady with a history of having flown back from [place] about 3 weeks ago.  A day or 2 later she got ill with a cough.  She has persisted with cough.  It bothers her sleep.  It is productive of yellowish phlegm in the morning, then clears up more as the day goes on.  She does not smoke.  Her friend had a similar infection prior to her.  She does not feel good but is not been really ill with infection and has not had any fever  Current allergies, medications, problem list, past/family and social histories reviewed.  Objective:  BP 133/76   Pulse 65   Temp 98.2 F (36.8 C)   Resp 16   Ht 5\' 5"  (1.651 m)   Wt 137 lb (62.1 kg)   SpO2 94%   BMI 22.80 kg/m   Pleasant lady, alert and oriented.  TMs normal.  Throat clear.  Neck supple without significant nodes.  Chest is clear to auscultation without rhonchi, rales, or wheezes.  She is coughing..  Assessment & Plan:   Assessment: 1. Post-viral cough syndrome       Plan: See instructions.  No orders of the defined types were placed in this encounter.   Meds ordered this encounter  Medications  . azithromycin (ZITHROMAX) 250 MG tablet    Sig: Take 2 initially, then one daily for 4 days    Dispense:  6 tablet    Refill:  0  . predniSONE (DELTASONE) 20 MG tablet    Sig: Take 3 on first day, then 2 on second day, then one on third day    Dispense:  6 tablet    Refill:  0  . benzonatate (TESSALON) 100 MG capsule    Sig: Take 1-2 capsules (100-200 mg total) by mouth 3 (three) times daily as needed for cough.    Dispense:  40 capsule    Refill:  0  . HYDROcodone-homatropine (HYCODAN) 5-1.5 MG/5ML syrup    Sig: Take 5 mLs by mouth every 4 (four) hours as needed.    Dispense:  60 mL    Refill:  0         Patient  Instructions   Drink plenty of fluids and get enough rest  Take the benzonatate cough pills 1 or 2 pills 3 times daily as needed for daytime cough  Take the Hycodan cough syrup 1 teaspoon every 4-6 hours as needed for nighttime cough  Take the prednisone 20 mg 3 pills of the first morning, then to the next morning and one the third morning.  Best taken after breakfast.  Take the azithromycin 2 pills initially, then 1 daily for 4 days for antibiotic  If you are not improving over the next week or 10 days return for reassessment and a probable chest x-ray.  If your sugar goes up over the next few days on the prednisone, you may need to take a little extra insulin as discussed.    IF you received an x-ray today, you will receive an invoice from [facility]. Please contact [facility] at [phone] with questions or concerns regarding your invoice.   IF you received labwork today, you will receive an invoice from [facility]. Please contact  [facility] at [phone] with questions or concerns regarding your invoice.   Our billing staff will not be able to assist you with questions regarding bills from these companies.  You will be contacted with the lab results as soon as they are available. The fastest way to get your results is to activate your My Chart account. Instructions are located on the last page of this paperwork. If you have not heard from [place] regarding the results in 2 weeks, please contact this office.         Return if symptoms worsen or fail to improve.   Aleasha Fregeau, MD 12/11/2016

## 2016-12-25 ENCOUNTER — Encounter: Payer: Self-pay | Admitting: Family Medicine

## 2016-12-25 ENCOUNTER — Other Ambulatory Visit: Payer: Self-pay

## 2016-12-25 ENCOUNTER — Ambulatory Visit (INDEPENDENT_AMBULATORY_CARE_PROVIDER_SITE_OTHER): Payer: PPO | Admitting: Family Medicine

## 2016-12-25 DIAGNOSIS — R058 Other specified cough: Secondary | ICD-10-CM

## 2016-12-25 DIAGNOSIS — R05 Cough: Secondary | ICD-10-CM

## 2016-12-25 MED ORDER — GUAIFENESIN ER 600 MG PO TB12: 600.0000 mg | ORAL_TABLET | Freq: Two times a day (BID) | ORAL | 0 refills | Status: DC

## 2016-12-25 MED ORDER — BENZONATATE 100 MG PO CAPS: 100.0000 mg | ORAL_CAPSULE | Freq: Three times a day (TID) | ORAL | 0 refills | Status: DC | PRN

## 2016-12-25 NOTE — Patient Instructions (Addendum)
Avoid medications with D or DM as those meds like Mucinex DM or sudafed D can raise your blood pressure and pulse.     IF you received an x-ray today, you will receive an invoice from [facility]. Please contact [facility] at [phone] with questions or concerns regarding your invoice.   IF you received labwork today, you will receive an invoice from [facility]. Please contact [facility] at [phone] with questions or concerns regarding your invoice.   Our billing staff will not be able to assist you with questions regarding bills from these companies.  You will be contacted with the lab results as soon as they are available. The fastest way to get your results is to activate your My Chart account. Instructions are located on the last page of this paperwork. If you have not heard from [place] regarding the results in 2 weeks, please contact this office.     Cough, Adult Coughing is a reflex that clears your throat and your airways. Coughing helps to heal and protect your lungs. It is normal to cough occasionally, but a cough that happens with other symptoms or lasts a long time may be a sign of a condition that needs treatment. A cough may last only 2-3 weeks (acute), or it may last longer than 8 weeks (chronic). What are the causes? Coughing is commonly caused by:  Breathing in substances that irritate your lungs.  A viral or bacterial respiratory infection.  Allergies.  Asthma.  Postnasal drip.  Smoking.  Acid backing up from the stomach into the esophagus (gastroesophageal reflux).  Certain medicines.  Chronic lung problems, including COPD (or rarely, lung cancer).  Other medical conditions such as heart failure.  Follow these instructions at home: Pay attention to any changes in your symptoms. Take these actions to help with your discomfort:  Take medicines only as told by your health care provider. ? If you were prescribed an antibiotic medicine, take  it as told by your health care provider. Do not stop taking the antibiotic even if you start to feel better. ? Talk with your health care provider before you take a cough suppressant medicine.  Drink enough fluid to keep your urine clear or pale yellow.  If the air is dry, use a cold steam vaporizer or humidifier in your bedroom or your home to help loosen secretions.  Avoid anything that causes you to cough at work or at home.  If your cough is worse at night, try sleeping in a semi-upright position.  Avoid cigarette smoke. If you smoke, quit smoking. If you need help quitting, ask your health care provider.  Avoid caffeine.  Avoid alcohol.  Rest as needed.  Contact a health care provider if:  You have new symptoms.  You cough up pus.  Your cough does not get better after 2-3 weeks, or your cough gets worse.  You cannot control your cough with suppressant medicines and you are losing sleep.  You develop pain that is getting worse or pain that is not controlled with pain medicines.  You have a fever.  You have unexplained weight loss.  You have night sweats. Get help right away if:  You cough up blood.  You have difficulty breathing.  Your heartbeat is very fast. This information is not intended to replace advice given to you by your health care provider. Make sure you discuss any questions you have with your health care provider. Document Released: 06/28/2010 Document Revised: 06/07/2015 Document Reviewed: 03/08/2014 Elsevier Interactive  Patient Education  2017 Elsevier Inc.  

## 2016-12-25 NOTE — Progress Notes (Signed)
Chief Complaint  Patient presents with  . still having cold sxs, seen by hopper on 12/10/16 but worsen    Early mornings drainage is yellowish in color and during the day the drainage is clear    HPI   Pt with cough and nasal drainage for the past month She reports that she went to [place] and returned on 11/18/2016 and around that time started to feel ill She saw Dr. Linna Darner for post viral cough syndrome and was given zpak and tessalon  She reports that she took all the meds but still is coughing She reports that the cough is productive of yellow sputum in the morning by the end of the sputum gets clear She denies chest pain, fevers or chills She ran out of the cough syrup prescribed She took some tusson DM  She denies tobacco use or history of asthma   4 review of systems  Past Medical History:  Diagnosis Date  . CAD (coronary artery disease) 2006   s/p Quadrupal CABG 2006  . CHF (congestive heart failure) ([place]) 2010   EF 30-35% from Echo 06/2008  . CKD (chronic kidney disease) stage 4, GFR 15-29 ml/min (HCC)    fluctuating between stage 3 and 4 depending on GFR  . Diverticulosis   . DIVERTICULOSIS OF COLON 04/01/2007   Qualifier: Diagnosis of  By: Nelson-Smith CMA (AAMA), [name]    . 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 History of colonic polyps   . HLD (hyperlipidemia)   . HTN (hypertension)   . Lumbar spinal stenosis   . MI (myocardial infarction) ([facility]) 2003   . [place]  . Mouth ulcer adjacent to jaw bone 08/22/2010   S/p removal by ENT 09/2010   . MYOCARDIAL INFARCTION, HX OF 09/09/2006   Qualifier: Diagnosis of  By: Marinda Elk MD, Sonia Side    . Nephrolithiasis    hx of  . NEPHROLITHIASIS 04/01/2007   Qualifier: Diagnosis of  By: Harlon Ditty CMA (AAMA), [name]    . OA (osteoarthritis)   . Osteopenia 2010   T score of -1.8  . Psoriasis   . PVD (peripheral vascular disease) (HCC)    ABI indicated moderated reduction arterial flow on the left.  .  Tricuspid regurgitation 2009   moderate to severe, EF 55%    Current Outpatient Medications  Medication Sig Dispense Refill  . ACCU-CHEK FASTCLIX LANCETS MISC Check blood sugar 3 times a day 102 each 12  . amLODipine (NORVASC) 10 MG tablet Take 1 tablet (10 mg total) by mouth daily. 90 tablet 3  . aspirin EC 81 MG tablet Take 81 mg by mouth daily.    Marland Kitchen atenolol (TENORMIN) 25 MG tablet Take 0.5 tablets (12.5 mg total) by mouth daily. 45 tablet 3  . B-D INS SYR HALF-UNIT .3CC/31G 31G X 5/16" 0.3 ML MISC     . benzonatate (TESSALON) 100 MG capsule Take 1-2 capsules (100-200 mg total) by mouth 3 (three) times daily as needed for cough. 40 capsule 0  . Blood Glucose Monitoring Suppl (FREESTYLE LITE) DEVI Check blood sugar up to 4 times a day 1 each 0  . Calcium Carb-Cholecalciferol (CALCIUM 600 + D PO) Take 1 tablet by mouth 2 (two) times daily.    . clopidogrel (PLAVIX) 75 MG tablet Take 1 tablet (75 mg total) by mouth daily. 90 tablet 3  . diclofenac sodium (VOLTAREN) 1 % GEL Apply 2 g topically 4 (four) times daily. 100 g 3  . famotidine (  PEPCID) 20 MG tablet TAKE 1 TABLET(20 MG) BY MOUTH TWICE DAILY 180 tablet 3  . furosemide (LASIX) 40 MG tablet Take 1 tablet (40 mg total) by mouth daily. 90 tablet 0  . glucose blood (FREESTYLE LITE) test strip 1 each by Other route 4 (four) times daily -  before meals and at bedtime. Diagnosis code E11.8, Z79.4 300 each 4  . HUMIRA PEN 40 MG/0.8ML PNKT     . Insulin Glargine (LANTUS SOLOSTAR) 100 UNIT/ML Solostar Pen INJECT 44 UNITS INTO THE SKIN DAILY AT 10PM 45 mL 11  . Insulin Pen Needle (PEN NEEDLES) 32G X 4 MM MISC 1 each by Does not apply route 4 (four) times daily -  before meals and at bedtime. 100 each 11  . insulin regular (NOVOLIN R) 100 units/mL injection     . insulin regular (NOVOLIN R) 250 units/2.84mL (100 units/mL) injection INJECT 16 UNITS BEFORE BREAKFAST, 16 UNITS BEFORE LUNCH AND 14 UNITS BEFORE DINNER 15 mL 11  . Insulin Syringe-Needle  U-100 (BD VEO INSULIN SYR ULTRAFINE) 31G X 15/64" 0.3 ML MISC U TO INJ MEALTIME INSULIN TID    . Insulin Syringe-Needle U-100 31G X 15/64" 0.3 ML MISC Use to inject mealtime insulin 3 times a day Dx code 250.00 insulin requiring 300 each 3  . Lancets (FREESTYLE) lancets 1 each by Other route 4 (four) times daily -  before meals and at bedtime. 100 each 11  . nitroGLYCERIN (NITROSTAT) 0.3 MG SL tablet Place 1 tablet (0.3 mg total) under the tongue every 5 (five) minutes as needed for chest pain. 90 tablet 12  . potassium chloride (K-DUR) 10 MEQ tablet Take 1 tablet (10 mEq total) by mouth 2 (two) times daily. 180 tablet 3  . predniSONE (DELTASONE) 20 MG tablet Take 3 on first day, then 2 on second day, then one on third day 6 tablet 0  . rosuvastatin (CRESTOR) 20 MG tablet TAKE 1 TABLET(20 MG) BY MOUTH DAILY 90 tablet 3  . traMADol (ULTRAM) 50 MG tablet Take 1 tablet (50 mg total) by mouth daily as needed. 30 tablet 2  . guaiFENesin (MUCINEX) 600 MG 12 hr tablet Take 1 tablet (600 mg total) by mouth 2 (two) times daily. 60 tablet 0   No current facility-administered medications for this visit.     Allergies:  Allergies  Allergen Reactions  . Valsartan Cough    Past Surgical History:  Procedure Laterality Date  . CATARACT EXTRACTION  2008   left eye  . CORONARY ANGIOPLASTY WITH STENT PLACEMENT  2003  . CORONARY ARTERY BYPASS GRAFT  2006   4 vessel  . LAPAROSCOPIC SUPRACERVICAL HYSTERECTOMY    . PERIPHERAL VASCULAR CATHETERIZATION N/A 07/03/2014   Procedure: Abdominal Aortogram w/Lower Extremity;  Surgeon: Angelia Mould, MD;  Location: [facility];  Service: Cardiovascular;  Laterality: N/A;    Social History   Socioeconomic History  . Marital status: Widowed    Spouse name: None  . Number of children: None  . Years of education: None  . Highest education level: None  Social Needs  . Financial resource strain: None  . Food insecurity - worry: None  . Food  insecurity - inability: None  . Transportation needs - medical: None  . Transportation needs - non-medical: None  Occupational History  . None  Tobacco Use  . Smoking status: Never Smoker  . Smokeless tobacco: Never Used  Substance and Sexual Activity  . Alcohol use: No    Alcohol/week:  0.0 oz  . Drug use: No  . Sexual activity: None  Other Topics Concern  . None  Social History Narrative   Retired Event organiser   Widow/Widower   lives in [place]   4 kids (2 sons, 2 daughters) one son died many years ago in car accident.  other kids are all in [place].   Has friend that helps take care of her    Family History  Problem Relation Age of Onset  . Cirrhosis Brother   . Heart disease Brother   . Heart attack Brother   . Hypertension Mother   . Hyperlipidemia Mother   . Hyperlipidemia Son   . Hypertension Son      ROS Review of Systems See HPI [place]: No fevers or chills No malaise No diaphoresis Skin: No rash or itching Eyes: no blurry vision, no double vision GU: no dysuria or hematuria Neuro: no dizziness or headaches all others reviewed and negative   Objective: Vitals:   12/25/16 1328  BP: 130/60  Pulse: 68  Resp: 16  Temp: 98.7 F (37.1 C)  TempSrc: Oral  SpO2: 96%  Weight: 133 lb 12.8 oz (60.7 kg)  Height: 5\' 5"  (1.651 m)    Physical Exam General: alert, oriented, in NAD Head: normocephalic, atraumatic, no sinus tenderness Eyes: EOM intact, no scleral icterus or conjunctival injection Ears: TM clear bilaterally Nose: mucosa nonerythematous, nonedematous Throat: no pharyngeal exudate or erythema Lymph: no posterior auricular, submental or cervical lymph adenopathy Heart: normal rate, normal sinus rhythm, no murmurs Lungs: clear to auscultation bilaterally, no wheezing   Assessment and Plan [place] was seen today for still having cold sxs, seen by hopper on 12/10/16 but worsen.  Diagnoses and all orders for this  visit:  Post-viral cough syndrome -     benzonatate (TESSALON) 100 MG capsule; Take 1-2 capsules (100-200 mg total) by mouth 3 (three) times daily as needed for cough.  Other orders -     guaiFENesin (MUCINEX) 600 MG 12 hr tablet; Take 1 tablet (600 mg total) by mouth 2 (two) times daily.    Continue supportive care abx not indicated  Alice

## 2016-12-30 ENCOUNTER — Other Ambulatory Visit: Payer: Self-pay | Admitting: Internal Medicine

## 2016-12-30 DIAGNOSIS — Z1231 Encounter for screening mammogram for malignant neoplasm of breast: Secondary | ICD-10-CM

## 2017-01-02 ENCOUNTER — Encounter: Payer: Self-pay | Admitting: *Deleted

## 2017-01-14 ENCOUNTER — Encounter: Payer: PPO | Admitting: Internal Medicine

## 2017-01-22 ENCOUNTER — Other Ambulatory Visit: Payer: Self-pay | Admitting: *Deleted

## 2017-01-22 DIAGNOSIS — E1122 Type 2 diabetes mellitus with diabetic chronic kidney disease: Secondary | ICD-10-CM

## 2017-01-22 DIAGNOSIS — N183 Chronic kidney disease, stage 3 unspecified: Secondary | ICD-10-CM

## 2017-01-22 DIAGNOSIS — Z794 Long term (current) use of insulin: Principal | ICD-10-CM

## 2017-01-22 MED ORDER — FREESTYLE LANCETS MISC: 1.0000 | Freq: Three times a day (TID) | 11 refills | Status: DC

## 2017-01-22 NOTE — Telephone Encounter (Signed)
Next appt scheduled  04/01/17 with PCP.

## 2017-01-23 ENCOUNTER — Encounter: Payer: Self-pay | Admitting: Family

## 2017-01-23 ENCOUNTER — Ambulatory Visit: Payer: PPO | Admitting: Family

## 2017-01-23 VITALS — BP 163/71 | HR 55 | Temp 97.7°F | Resp 18 | Ht 65.0 in | Wt 136.5 lb

## 2017-01-23 DIAGNOSIS — Z95828 Presence of other vascular implants and grafts: Secondary | ICD-10-CM | POA: Diagnosis not present

## 2017-01-23 DIAGNOSIS — I6523 Occlusion and stenosis of bilateral carotid arteries: Secondary | ICD-10-CM

## 2017-01-23 DIAGNOSIS — I779 Disorder of arteries and arterioles, unspecified: Secondary | ICD-10-CM | POA: Diagnosis not present

## 2017-01-23 DIAGNOSIS — E1151 Type 2 diabetes mellitus with diabetic peripheral angiopathy without gangrene: Secondary | ICD-10-CM

## 2017-01-23 NOTE — Patient Instructions (Signed)
Peripheral Vascular Disease Peripheral vascular disease (PVD) is a disease of the blood vessels that are not part of your heart and brain. A simple term for PVD is poor circulation. In most cases, PVD narrows the blood vessels that carry blood from your heart to the rest of your body. This can result in a decreased supply of blood to your arms, legs, and internal organs, like your stomach or kidneys. However, it most often affects a person's lower legs and feet. There are two types of PVD.  Organic PVD. This is the more common type. It is caused by damage to the structure of blood vessels.  Functional PVD. This is caused by conditions that make blood vessels contract and tighten (spasm).  Without treatment, PVD tends to get worse over time. PVD can also lead to acute ischemic limb. This is when an arm or limb suddenly has trouble getting enough blood. This is a medical emergency. Follow these instructions at home:  Take medicines only as told by your doctor.  Do not use any tobacco products, including cigarettes, chewing tobacco, or electronic cigarettes. If you need help quitting, ask your doctor.  Lose weight if you are overweight, and maintain a healthy weight as told by your doctor.  Eat a diet that is low in fat and cholesterol. If you need help, ask your doctor.  Exercise regularly. Ask your doctor for some good activities for you.  Take good care of your feet. ? Wear comfortable shoes that fit well. ? Check your feet often for any cuts or sores. Contact a doctor if:  You have cramps in your legs while walking.  You have leg pain when you are at rest.  You have coldness in a leg or foot.  Your skin changes.  You are unable to get or have an erection (erectile dysfunction).  You have cuts or sores on your feet that are not healing. Get help right away if:  Your arm or leg turns cold and blue.  Your arms or legs become red, warm, swollen, painful, or numb.  You have  chest pain or trouble breathing.  You suddenly have weakness in your face, arm, or leg.  You become very confused or you cannot speak.  You suddenly have a very bad headache.  You suddenly cannot see. This information is not intended to replace advice given to you by your health care provider. Make sure you discuss any questions you have with your health care provider. Document Released: 03/26/2009 Document Revised: 06/07/2015 Document Reviewed: 06/09/2013 Elsevier Interactive Patient Education  2017 Elsevier Inc.       Stroke Prevention Some health problems and behaviors may make it more likely for you to have a stroke. Below are ways to lessen your risk of having a stroke.  Be active for at least 30 minutes on most or all days.  Do not smoke. Try not to be around others who smoke.  Do not drink too much alcohol. ? Do not have more than 2 drinks a day if you are a man. ? Do not have more than 1 drink a day if you are a woman and are not pregnant.  Eat healthy foods, such as fruits and vegetables. If you were put on a specific diet, follow the diet as told.  Keep your cholesterol levels under control through diet and medicines. Look for foods that are low in saturated fat, trans fat, cholesterol, and are high in fiber.  If you have diabetes, follow   all diet plans and take your medicine as told.  Ask your doctor if you need treatment to lower your blood pressure. If you have high blood pressure (hypertension), follow all diet plans and take your medicine as told by your doctor.  If you are 18-39 years old, have your blood pressure checked every 3-5 years. If you are age 40 or older, have your blood pressure checked every year.  Keep a healthy weight. Eat foods that are low in calories, salt, saturated fat, trans fat, and cholesterol.  Do not take drugs.  Avoid birth control pills, if this applies. Talk to your doctor about the risks of taking birth control pills.  Talk to  your doctor if you have sleep problems (sleep apnea).  Take all medicine as told by your doctor. ? You may be told to take aspirin or blood thinner medicine. Take this medicine as told by your doctor. ? Understand your medicine instructions.  Make sure any other conditions you have are being taken care of.  Get help right away if:  You suddenly lose feeling (you feel numb) or have weakness in your face, arm, or leg.  Your face or eyelid hangs down to one side.  You suddenly feel confused.  You have trouble talking (aphasia) or understanding what people are saying.  You suddenly have trouble seeing in one or both eyes.  You suddenly have trouble walking.  You are dizzy.  You lose your balance or your movements are clumsy (uncoordinated).  You suddenly have a very bad headache and you do not know the cause.  You have new chest pain.  Your heart feels like it is fluttering or skipping a beat (irregular heartbeat). Do not wait to see if the symptoms above go away. Get help right away. Call your local emergency services (911 in [place] Do not drive yourself to the hospital. This information is not intended to replace advice given to you by your health care provider. Make sure you discuss any questions you have with your health care provider. Document Released: 07/01/2011 Document Revised: 06/07/2015 Document Reviewed: 07/02/2012 Elsevier Interactive Patient Education  2018 Elsevier Inc.  

## 2017-01-23 NOTE — Progress Notes (Signed)
[facility] [place] HISTORY AND PHYSICAL   MRN : [id number]  History of Present Illness:   [name] is a 78 y.o. female patient of Dr. Scot Dock who had presented with progressive ischemia of the left lower extremity and significant rest pain. She had no left femoral pulse. Of note her ABI was 25% and on exam she had evidence of multilevel arterial occlusive disease.  She was taken to the peripheral vascular lab on 07/03/2014. She underwent PTA and 6 mm x 29 mm stent of the left common iliac artery for a chronic total occlusion. There was no residual stenosis.  Pt returned on 11-27-16 after received phone call on 11-20-16 from [facility] at [facility] who wants to refer patient to be seen in [facility] office. Patient seen at [facility] that day and had ABI's done (were 0.99 bilaterally, review of records) for c/o left leg pain and swelling. [name] in Vascular lab recommended patient get Duplex doppler studies done.[name] said she would schedule this for patient. Her left calf started hurting in October 2018, had been getting worse, left calf hurt after walking 40 yards to her mailbox, pain did not resolve with resting, did resolve with propping up left leg. Pain in left calf continued on standing. There was mild swelling in her left calf and ankle.  She denied any dyspnea.   She returns today with c/o increasing pain in left anterior thigh, starting in December 2018, hurts with sitting or walking. Occasionally pain will radiate from her left buttock posteriorly and distally to left foot. She denies any known back problems, but her documented PMHx includes lumbar spinal stenosis. She denies any long periods of sitting, travel, or immobilization. She had a negative DVT study in November 2018.   She was seen by her PCP in December 2018 for cough.   Pt denies any hx of stroke or TIA.   Pt Diabetic: Yes, A1C was6.9 on 10-22-16 (review of records) Pt smoker:  non-smoker  Pt meds include: Statin :Yes Betablocker: Yes ASA: Yes Other anticoagulants/antiplatelets: Plavix     Current Outpatient Medications  Medication Sig Dispense Refill  . ACCU-CHEK FASTCLIX LANCETS MISC Check blood sugar 3 times a day 102 each 12  . amLODipine (NORVASC) 10 MG tablet Take 1 tablet (10 mg total) by mouth daily. 90 tablet 3  . aspirin EC 81 MG tablet Take 81 mg by mouth daily.    Marland Kitchen atenolol (TENORMIN) 25 MG tablet Take 0.5 tablets (12.5 mg total) by mouth daily. 45 tablet 3  . B-D INS SYR HALF-UNIT .3CC/31G 31G X 5/16" 0.3 ML MISC     . benzonatate (TESSALON) 100 MG capsule Take 1-2 capsules (100-200 mg total) by mouth 3 (three) times daily as needed for cough. 40 capsule 0  . Blood Glucose Monitoring Suppl (FREESTYLE LITE) DEVI Check blood sugar up to 4 times a day 1 each 0  . Calcium Carb-Cholecalciferol (CALCIUM 600 + D PO) Take 1 tablet by mouth 2 (two) times daily.    . clopidogrel (PLAVIX) 75 MG tablet Take 1 tablet (75 mg total) by mouth daily. 90 tablet 3  . diclofenac sodium (VOLTAREN) 1 % GEL Apply 2 g topically 4 (four) times daily. 100 g 3  . famotidine (PEPCID) 20 MG tablet TAKE 1 TABLET(20 MG) BY MOUTH TWICE DAILY 180 tablet 3  . furosemide (LASIX) 40 MG tablet Take 1 tablet (40 mg total) by mouth daily. 90 tablet 0  . glucose blood (FREESTYLE  LITE) test strip 1 each by Other route 4 (four) times daily -  before meals and at bedtime. Diagnosis code E11.8, Z79.4 300 each 4  . guaiFENesin (MUCINEX) 600 MG 12 hr tablet Take 1 tablet (600 mg total) by mouth 2 (two) times daily. 60 tablet 0  . HUMIRA PEN 40 MG/0.8ML PNKT     . Insulin Glargine (LANTUS SOLOSTAR) 100 UNIT/ML Solostar Pen INJECT 44 UNITS INTO THE SKIN DAILY AT 10PM 45 mL 11  . Insulin Pen Needle (PEN NEEDLES) 32G X 4 MM MISC 1 each by Does not apply route 4 (four) times daily -  before meals and at bedtime. 100 each 11  . insulin regular (NOVOLIN R) 100 units/mL injection     . insulin  regular (NOVOLIN R) 250 units/2.39mL (100 units/mL) injection INJECT 16 UNITS BEFORE BREAKFAST, 16 UNITS BEFORE LUNCH AND 14 UNITS BEFORE DINNER 15 mL 11  . Insulin Syringe-Needle U-100 (BD VEO INSULIN SYR ULTRAFINE) 31G X 15/64" 0.3 ML MISC U TO INJ MEALTIME INSULIN TID    . Insulin Syringe-Needle U-100 31G X 15/64" 0.3 ML MISC Use to inject mealtime insulin 3 times a day Dx code 250.00 insulin requiring 300 each 3  . Lancets (FREESTYLE) lancets 1 each by Other route 4 (four) times daily -  before meals and at bedtime. 100 each 11  . nitroGLYCERIN (NITROSTAT) 0.3 MG SL tablet Place 1 tablet (0.3 mg total) under the tongue every 5 (five) minutes as needed for chest pain. 90 tablet 12  . potassium chloride (K-DUR) 10 MEQ tablet Take 1 tablet (10 mEq total) by mouth 2 (two) times daily. 180 tablet 3  . predniSONE (DELTASONE) 20 MG tablet Take 3 on first day, then 2 on second day, then one on third day 6 tablet 0  . rosuvastatin (CRESTOR) 20 MG tablet TAKE 1 TABLET(20 MG) BY MOUTH DAILY 90 tablet 3  . traMADol (ULTRAM) 50 MG tablet Take 1 tablet (50 mg total) by mouth daily as needed. 30 tablet 2   No current facility-administered medications for this visit.     Past Medical History:  Diagnosis Date  . CAD (coronary artery disease) 2006   s/p Quadrupal CABG 2006  . CHF (congestive heart failure) ([place]) 2010   EF 30-35% from Echo 06/2008  . CKD (chronic kidney disease) stage 4, GFR 15-29 ml/min (HCC)    fluctuating between stage 3 and 4 depending on GFR  . Diverticulosis   . DIVERTICULOSIS OF COLON 04/01/2007   Qualifier: Diagnosis of  By: Nelson-Smith CMA (AAMA), [name]    . 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 History of colonic polyps   . HLD (hyperlipidemia)   . HTN (hypertension)   . Lumbar spinal stenosis   . MI (myocardial infarction) ([place]) 2003   . [place]  . Mouth ulcer adjacent to jaw bone 08/22/2010   S/p removal by ENT 09/2010   . MYOCARDIAL INFARCTION, HX  OF 09/09/2006   Qualifier: Diagnosis of  By: Marinda Elk MD, Sonia Side    . Nephrolithiasis    hx of  . NEPHROLITHIASIS 04/01/2007   Qualifier: Diagnosis of  By: Harlon Ditty CMA (AAMA), [name]    . OA (osteoarthritis)   . Osteopenia 2010   T score of -1.8  . Psoriasis   . PVD (peripheral vascular disease) (HCC)    ABI indicated moderated reduction arterial flow on the left.  . Tricuspid regurgitation 2009   moderate to severe, EF 55%  Social History Social History   Tobacco Use  . Smoking status: Never Smoker  . Smokeless tobacco: Never Used  Substance Use Topics  . Alcohol use: No    Alcohol/week: 0.0 oz  . Drug use: No    Family History Family History  Problem Relation Age of Onset  . Cirrhosis Brother   . Heart disease Brother   . Heart attack Brother   . Hypertension Mother   . Hyperlipidemia Mother   . Hyperlipidemia Son   . Hypertension Son     Surgical History Past Surgical History:  Procedure Laterality Date  . CATARACT EXTRACTION  2008   left eye  . CORONARY ANGIOPLASTY WITH STENT PLACEMENT  2003  . CORONARY ARTERY BYPASS GRAFT  2006   4 vessel  . LAPAROSCOPIC SUPRACERVICAL HYSTERECTOMY    . PERIPHERAL VASCULAR CATHETERIZATION N/A 07/03/2014   Procedure: Abdominal Aortogram w/Lower Extremity;  Surgeon: Angelia Mould, MD;  Location: [place] [facility];  Service: Cardiovascular;  Laterality: N/A;    Allergies  Allergen Reactions  . Valsartan Cough    Current Outpatient Medications  Medication Sig Dispense Refill  . ACCU-CHEK FASTCLIX LANCETS MISC Check blood sugar 3 times a day 102 each 12  . amLODipine (NORVASC) 10 MG tablet Take 1 tablet (10 mg total) by mouth daily. 90 tablet 3  . aspirin EC 81 MG tablet Take 81 mg by mouth daily.    Marland Kitchen atenolol (TENORMIN) 25 MG tablet Take 0.5 tablets (12.5 mg total) by mouth daily. 45 tablet 3  . B-D INS SYR HALF-UNIT .3CC/31G 31G X 5/16" 0.3 ML MISC     . benzonatate (TESSALON) 100 MG capsule Take 1-2  capsules (100-200 mg total) by mouth 3 (three) times daily as needed for cough. 40 capsule 0  . Blood Glucose Monitoring Suppl (FREESTYLE LITE) DEVI Check blood sugar up to 4 times a day 1 each 0  . Calcium Carb-Cholecalciferol (CALCIUM 600 + D PO) Take 1 tablet by mouth 2 (two) times daily.    . clopidogrel (PLAVIX) 75 MG tablet Take 1 tablet (75 mg total) by mouth daily. 90 tablet 3  . diclofenac sodium (VOLTAREN) 1 % GEL Apply 2 g topically 4 (four) times daily. 100 g 3  . famotidine (PEPCID) 20 MG tablet TAKE 1 TABLET(20 MG) BY MOUTH TWICE DAILY 180 tablet 3  . furosemide (LASIX) 40 MG tablet Take 1 tablet (40 mg total) by mouth daily. 90 tablet 0  . glucose blood (FREESTYLE LITE) test strip 1 each by Other route 4 (four) times daily -  before meals and at bedtime. Diagnosis code E11.8, Z79.4 300 each 4  . guaiFENesin (MUCINEX) 600 MG 12 hr tablet Take 1 tablet (600 mg total) by mouth 2 (two) times daily. 60 tablet 0  . HUMIRA PEN 40 MG/0.8ML PNKT     . Insulin Glargine (LANTUS SOLOSTAR) 100 UNIT/ML Solostar Pen INJECT 44 UNITS INTO THE SKIN DAILY AT 10PM 45 mL 11  . Insulin Pen Needle (PEN NEEDLES) 32G X 4 MM MISC 1 each by Does not apply route 4 (four) times daily -  before meals and at bedtime. 100 each 11  . insulin regular (NOVOLIN R) 100 units/mL injection     . insulin regular (NOVOLIN R) 250 units/2.54mL (100 units/mL) injection INJECT 16 UNITS BEFORE BREAKFAST, 16 UNITS BEFORE LUNCH AND 14 UNITS BEFORE DINNER 15 mL 11  . Insulin Syringe-Needle U-100 (BD VEO INSULIN SYR ULTRAFINE) 31G X 15/64" 0.3 ML MISC U TO  INJ MEALTIME INSULIN TID    . Insulin Syringe-Needle U-100 31G X 15/64" 0.3 ML MISC Use to inject mealtime insulin 3 times a day Dx code 250.00 insulin requiring 300 each 3  . Lancets (FREESTYLE) lancets 1 each by Other route 4 (four) times daily -  before meals and at bedtime. 100 each 11  . nitroGLYCERIN (NITROSTAT) 0.3 MG SL tablet Place 1 tablet (0.3 mg total) under the tongue  every 5 (five) minutes as needed for chest pain. 90 tablet 12  . potassium chloride (K-DUR) 10 MEQ tablet Take 1 tablet (10 mEq total) by mouth 2 (two) times daily. 180 tablet 3  . predniSONE (DELTASONE) 20 MG tablet Take 3 on first day, then 2 on second day, then one on third day 6 tablet 0  . rosuvastatin (CRESTOR) 20 MG tablet TAKE 1 TABLET(20 MG) BY MOUTH DAILY 90 tablet 3  . traMADol (ULTRAM) 50 MG tablet Take 1 tablet (50 mg total) by mouth daily as needed. 30 tablet 2   No current facility-administered medications for this visit.      REVIEW OF SYSTEMS: See HPI for pertinent positives and negatives.  Physical Examination Vitals:   01/23/17 0940 01/23/17 0943  BP: (!) 151/74 (!) 163/71  Pulse: (!) 55   Resp: 18   Temp: 97.7 F (36.5 C)   TempSrc: Oral   SpO2: 97%   Weight: 136 lb 8 oz (61.9 kg)   Height: 5\' 5"  (1.651 m)    Body mass index is 22.71 kg/m.  General:  A&O x 3, WDWN, female. Gait: normal Eyes: PERRLA. Pulmonary: Respirations are non labored, CTAB, no wheezes, rales, or rhonchi.  Cardiac: regular rhythm, no detected murmur.     Carotid Bruits Right Left   negative negative   Abdominal aortic pulse is not palpable. Radial pulses: 2+ palpable and =   VASCULAR EXAM: Extremitieswithout ischemic changes, without Gangrene; without open wounds. No peripheral edema, no cellulitis of legs.       LE Pulses Right Left   FEMORAL 2+ palpable 1+ palpable    POPLITEAL not palpable  not palpable   POSTERIOR TIBIAL not palpable  not palpable    DORSALIS PEDIS  ANTERIOR TIBIAL 2+ palpable  1+ palpable    Abdomen: soft, NT, no palpable masses. Skin: no rashes, no ulcers. Musculoskeletal: no muscle wasting or atrophy. Neurologic:  A&O X 3; Appropriate Affect ; SENSATION: normal; MOTOR FUNCTION: moving all extremities equally, motor strength 5/5 throughout in upper extremities, 4/5 in LE's. Speech is fluent/normal. CN 2-12 intact. No pain elicited with straight leg raise against pressure.  Psychiatric: Mood appropriate for clinical situation.    ASSESSMENT:  [name] is a 77 y.o. female who is s/p stent placement in the left common iliac artery for a chronic total occlusion on 07/03/14.  She returns today with c/o increasing pain in left anterior thigh, starting in December 2018, hurts with sitting or walking. Occasionally pain will radiate from her left buttock posteriorly and distally to left foot. She denies any known back problems, but her documented PMHx includes lumbar spinal stenosis. She denies any long periods of sitting, travel, or immobilization. She had a negative DVT study in November 2018.    No history of stroke or TIA. Her primary atherosclerotic risk factor is DM; other risk factors include CKD, and CAD.  Fortunately she has never used tobacco.   No left lower extremity DVT or SVT on 11-27-16. She does have varicosities noted on venous  duplex that are the likely source of pain in her calf the longer she stands;  I advised knee high graduated compression hose to help with these symptoms.   At pt visit on 11-27-16 I discussed with Dr. Scot Dock that pt had 99% bilateral ABI on 11-20-16, that her DVT duplex was negative for DVT, that her bilateral femoral pulses are palpable, and that she had a 519 cm/s velocity at the left proximal SFA. Pt has more than adequate collateral perfusion around the stenosis to achieve a 99% ABI on 11-20-16.  Dr. Scot Dock advised pt to perform graduated walking program, return in 6 months to evaluate left aortoiliac arteries (s/p left CIA stent with PTA on 07-03-14).  Today I discussed with Dr. Bridgett Larsson pt HPI, palpable bilateral DP pulses, negative DVT study 2 months ago, no  dyspnea, documented hx of lumbar spine stenosis, and occasional radiating pain from left posterior buttock to left foot. I advised pt to discuss with her PCP a possible evaluation of her lumbar spine and or left hip as the source of pain and sx's.   DATA  Left LE DVT duplex (11/27/16): Negative for DVT, and negative for superficial venous thrombosis. Varicosities noted in the left popliteal fossa and calf. Left profunda and tibial veins appear dilated, without thrombus visualized.   Left LE Arterial Duplex (11/27/16): Velocities in the proximal to mid segment of the left SFA suggest 50-74% stenosis, with diffuse plaque throughout the vessel. Minimal color flow with low velocities in the left posterior tibial artery with visualized collaterals suggest occlusion or near occlusion (519 cm/s).  Left anterior tibial artery and peroneal artery appear patent.    Carotid Duplex (06/25/16): Right ICA: <40% stenosis. Left ICA: 40-59% stenosis. Bilateral vertebral artery flow is antegrade.  Bilateral subclavian artery waveforms are normal. No prior exam for comparison.   ABI(Date:06/25/2016):  R:  ? ABI:0.91(0.99 on 12-25-15),  ? PP:IRJJ ? DP:bi ? TBI:0.58 (was 0.57)  L:  ? ABI:0.72(was 0.70),  ? OA:CZYS ? DP:bi ? TBI:0.67 (was 0.66) ? Right LE ABI has declined form normal to mild arterial occlusive disease, Left LE remains stable with moderate disease. Bilateral TBI remain stable.    PLAN:   Graduated walking program discussed and how to achieve.  Based on the patient's vascular studies and examination, pt will return to clinic in June 2019 as already advisedwith ABI's, left aortoiliac arterial duplex, and carotid duplex.  I advised her to notify us if she develops concerns re the circulation in her feet or legs.  I discussed in depth with the patient the nature of atherosclerosis, and emphasized the importance of maximal medical management including strict  control of blood pressure, blood glucose, and lipid levels, obtaining regular exercise, and cessation of smoking.  The patient is aware that without maximal medical management the underlying atherosclerotic disease process will progress, limiting the benefit of any interventions.  The patient was given information about stroke prevention and what symptoms should prompt the patient to seek immediate medical care.  The patient was given information about PAD including signs, symptoms, treatment, what symptoms should prompt the patient to seek immediate medical care, and risk reduction measures to take.  Thank you for allowing [place] to participate in this patient's care.  Clemon Chambers, RN, MSN, FNP-C [facility] Office: [phone]  Clinic MD: Chen/Cain 01/23/2017 10:01 AM

## 2017-01-24 ENCOUNTER — Encounter: Payer: Self-pay | Admitting: Family

## 2017-01-27 LAB — HM DIABETES EYE EXAM

## 2017-01-29 ENCOUNTER — Ambulatory Visit
Admission: RE | Admit: 2017-01-29 | Discharge: 2017-01-29 | Disposition: A | Discharge status: Home / Self Care | Payer: PPO | Source: Ambulatory Visit | Attending: Internal Medicine | Admitting: Internal Medicine

## 2017-01-29 DIAGNOSIS — Z1231 Encounter for screening mammogram for malignant neoplasm of breast: Secondary | ICD-10-CM

## 2017-02-09 ENCOUNTER — Ambulatory Visit: Payer: PPO

## 2017-02-10 ENCOUNTER — Encounter: Payer: Self-pay | Admitting: *Deleted

## 2017-02-10 ENCOUNTER — Ambulatory Visit: Payer: PPO

## 2017-02-17 ENCOUNTER — Ambulatory Visit
Admission: RE | Admit: 2017-02-17 | Discharge: 2017-02-17 | Disposition: A | Discharge status: Home / Self Care | Payer: PPO | Source: Ambulatory Visit | Attending: Internal Medicine | Admitting: Internal Medicine

## 2017-02-18 ENCOUNTER — Encounter: Payer: PPO | Admitting: Internal Medicine

## 2017-04-01 ENCOUNTER — Other Ambulatory Visit: Payer: Self-pay

## 2017-04-01 ENCOUNTER — Ambulatory Visit (INDEPENDENT_AMBULATORY_CARE_PROVIDER_SITE_OTHER): Payer: PPO | Admitting: Internal Medicine

## 2017-04-01 ENCOUNTER — Encounter (INDEPENDENT_AMBULATORY_CARE_PROVIDER_SITE_OTHER): Payer: Self-pay

## 2017-04-01 ENCOUNTER — Encounter: Payer: Self-pay | Admitting: Internal Medicine

## 2017-04-01 VITALS — BP 162/59 | HR 55 | Temp 98.2°F | Ht 62.0 in | Wt 141.8 lb

## 2017-04-01 DIAGNOSIS — I70298 Other atherosclerosis of native arteries of extremities, other extremity: Secondary | ICD-10-CM | POA: Diagnosis not present

## 2017-04-01 DIAGNOSIS — Z794 Long term (current) use of insulin: Secondary | ICD-10-CM | POA: Diagnosis not present

## 2017-04-01 DIAGNOSIS — E010 Iodine-deficiency related diffuse (endemic) goiter: Secondary | ICD-10-CM | POA: Diagnosis not present

## 2017-04-01 DIAGNOSIS — I130 Hypertensive heart and chronic kidney disease with heart failure and stage 1 through stage 4 chronic kidney disease, or unspecified chronic kidney disease: Secondary | ICD-10-CM | POA: Diagnosis not present

## 2017-04-01 DIAGNOSIS — I10 Essential (primary) hypertension: Secondary | ICD-10-CM | POA: Diagnosis not present

## 2017-04-01 DIAGNOSIS — I739 Peripheral vascular disease, unspecified: Secondary | ICD-10-CM

## 2017-04-01 DIAGNOSIS — I5030 Unspecified diastolic (congestive) heart failure: Secondary | ICD-10-CM | POA: Diagnosis not present

## 2017-04-01 DIAGNOSIS — N183 Chronic kidney disease, stage 3 (moderate): Secondary | ICD-10-CM

## 2017-04-01 DIAGNOSIS — E1122 Type 2 diabetes mellitus with diabetic chronic kidney disease: Secondary | ICD-10-CM

## 2017-04-01 DIAGNOSIS — Z79899 Other long term (current) drug therapy: Secondary | ICD-10-CM

## 2017-04-01 DIAGNOSIS — E785 Hyperlipidemia, unspecified: Secondary | ICD-10-CM | POA: Diagnosis not present

## 2017-04-01 DIAGNOSIS — E1151 Type 2 diabetes mellitus with diabetic peripheral angiopathy without gangrene: Secondary | ICD-10-CM | POA: Diagnosis not present

## 2017-04-01 DIAGNOSIS — I2510 Atherosclerotic heart disease of native coronary artery without angina pectoris: Secondary | ICD-10-CM

## 2017-04-01 LAB — GLUCOSE, CAPILLARY: Glucose-Capillary: 235 mg/dL — ABNORMAL HIGH (ref 65–99)

## 2017-04-01 LAB — POCT GLYCOSYLATED HEMOGLOBIN (HGB A1C): HEMOGLOBIN A1C: 7.1

## 2017-04-01 MED ORDER — OLMESARTAN MEDOXOMIL 5 MG PO TABS: 5.0000 mg | ORAL_TABLET | Freq: Every day | ORAL | 0 refills | Status: DC

## 2017-04-01 MED ORDER — FUROSEMIDE 40 MG PO TABS: 40.0000 mg | ORAL_TABLET | Freq: Every day | ORAL | 0 refills | Status: DC

## 2017-04-01 NOTE — Progress Notes (Signed)
CC: Routine visit and evaluation of her chronic conditions.  HPI:[name] is a 77 y.o.    HTN:  Patient's blood pressure is 139/63 and evaluation today.  Continue to have a target goal of 140/90 but would tolerate slightly higher given her age.  However, considering patient's heart failure I believe attending her medication regimen would be appropriate. Continue Amlodipine 10mg  daily Initiate olmesartan 5 mg daily would like to titrate this up as quickly as possible Informed patient to return in 1 month for blood pressure check. Treatment was checked patient's creatinine is 1.34 with a GFR of 38.  This is slightly worse than her previous evaluation 10 months ago but significantly improved from her evaluation one year prior.  We will continue to monitor  Diabetes: HgbA1c ordered, 7.1 up from 6.9. No changes indicated.   HLD: Lipid profile ordered for cholesterol 151, triglyceride 237, HDL 46, and LDL 58.  We will continue rosuvastatin 20 mg daily  CHF: Patient is concerned with increased dyspnea on exertion, and increasing peripheral edema.  Given her extensive coronary artery disease history and previous diagnosis of heart failure with preserved ejection fraction I believe her current presentation warrants a repeat echocardiogram as was previous on record is from 01/03/2015.  At that time her EF or her LV was 55-60%, and was notable grade 1 diastolic dysfunction.  She continues to experience progressively worsening dyspnea on exertion I am concerned that her heart failure may have worsened. Patient follows with cardiology and has an appointment in July established Echo ordered Atenolol 12.5 mg daily Furosemide 40 mg daily  Peripheral vascular disease/claudication: Patient was seen and evaluated by vascular and determined she had a 50-74% occlusion of the left superficial femoral artery.  This may be contributing to her leg pain after initiating ambulation repeat of time.   Symptoms are consistent with claudication described as aching pain in the posterior portion of her cath and distal posterior thigh following significant walking.  The pain eases at rest as improved with ceasing of activity.  There is no pain on palpation to the associated area. Vascular recommended a graduated walking program.  Will place referral to cardiac rehab to assist her with this.  Patient informed to call back if the pain worsens, or fevers or fail to resolve, of her extremities demonstrate blue discoloration or extreme pain.  Past Medical History:  Diagnosis Date  . CAD (coronary artery disease) 2006   s/p Quadrupal CABG 2006  . CHF (congestive heart failure) ([place]) 2010   EF 30-35% from Echo 06/2008  . CKD (chronic kidney disease) stage 4, GFR 15-29 ml/min (HCC)    fluctuating between stage 3 and 4 depending on GFR  . Diverticulosis   . DIVERTICULOSIS OF COLON 04/01/2007   Qualifier: Diagnosis of  By: Nelson-Smith CMA (AAMA), [name]    . 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 History of colonic polyps   . HLD (hyperlipidemia)   . HTN (hypertension)   . Lumbar spinal stenosis   . MI (myocardial infarction) ([place]) 2003   . [facility]  . Mouth ulcer adjacent to jaw bone 08/22/2010   S/p removal by ENT 09/2010   . MYOCARDIAL INFARCTION, HX OF 09/09/2006   Qualifier: Diagnosis of  By: Marinda Elk MD, Sonia Side    . Nephrolithiasis    hx of  . NEPHROLITHIASIS 04/01/2007   Qualifier: Diagnosis of  By: Harlon Ditty CMA (AAMA), [name]    . OA (osteoarthritis)   .  Osteopenia 2010   T score of -1.8  . Psoriasis   . PVD (peripheral vascular disease) (HCC)    ABI indicated moderated reduction arterial flow on the left.  . Tricuspid regurgitation 2009   moderate to severe, EF 55%   Review of Systems: ROS negative except as per HPI  Physical Exam:  Vitals:   04/01/17 1631  BP: (!) 159/63  Pulse: (!) 56  Temp: 98.2 F (36.8 C)  TempSrc: Oral  SpO2: 96%  Weight: 141 lb  12.8 oz (64.3 kg)  Height: 5\' 2"  (1.575 m)   General: In her chair Neuro: No acute focal deficits HEENT: No JVD noted on exam, thyromegaly noted Cardiovascular: RRR, grade 2 systolic ejection murmur Pulmonary: Lungs clear to auscultation bilaterally no wheezing or crackles observed GI: Abdomen is soft, nontender, nondistended, Musculoskeletal: 70s demonstrate +1 pitting edema a little bit below the knees and no significant skin changes   Assessment & Plan:   See Encounters Tab for problem based charting.  Patient discussed with Dr. Lynnae January

## 2017-04-01 NOTE — Patient Instructions (Addendum)
FOLLOW-UP INSTRUCTIONS When: One month for a BP check, again in three months with me   Thank you for your visit to the [facility].   If you have any questions please feel free to contact us at any time.

## 2017-04-02 LAB — LIPID PANEL
Chol/HDL Ratio: 3.3 ratio (ref 0.0–4.4)
Cholesterol, Total: 151 mg/dL (ref 100–199)
HDL: 46 mg/dL (ref >39)
LDL Calculated: 58 mg/dL (ref 0–99)
Triglycerides: 237 mg/dL — ABNORMAL HIGH (ref 0–149)
VLDL Cholesterol Cal: 47 mg/dL — ABNORMAL HIGH (ref 5–40)

## 2017-04-02 LAB — BMP8+ANION GAP
Anion Gap: 15 mmol/L (ref 10.0–18.0)
BUN/Creatinine Ratio: 13 (ref 12–28)
BUN: 18 mg/dL (ref 8–27)
CHLORIDE: 100 mmol/L (ref 96–106)
CO2: 27 mmol/L (ref 20–29)
Calcium: 9.2 mg/dL (ref 8.7–10.3)
Creatinine, Ser: 1.34 mg/dL — ABNORMAL HIGH (ref 0.57–1.00)
GFR calc Af Amer: 43 mL/min/{1.73_m2} — ABNORMAL LOW (ref >59)
GFR calc non Af Amer: 38 mL/min/{1.73_m2} — ABNORMAL LOW (ref >59)
GLUCOSE: 226 mg/dL — AB (ref 65–99)
Potassium: 4.4 mmol/L (ref 3.5–5.2)
Sodium: 142 mmol/L (ref 134–144)

## 2017-04-02 LAB — MICROALBUMIN / CREATININE URINE RATIO
Creatinine, Urine: 61.3 mg/dL
Microalb/Creat Ratio: 124.3 mg/g creat — ABNORMAL HIGH (ref 0.0–30.0)
Microalbumin, Urine: 76.2 ug/mL

## 2017-04-02 NOTE — Assessment & Plan Note (Signed)
CHF: Patient is concerned with increased dyspnea on exertion, and increasing peripheral edema.  Given her extensive coronary artery disease history and previous diagnosis of heart failure with preserved ejection fraction I believe her current presentation warrants a repeat echocardiogram as was previous on record is from 01/03/2015.  At that time her EF or her LV was 55-60%, and was notable grade 1 diastolic dysfunction.  She continues to experience progressively worsening dyspnea on exertion I am concerned that her heart failure may have worsened. Patient follows with cardiology and has an appointment in July established Echo ordered Atenolol 12.5 mg daily Furosemide 40 mg daily

## 2017-04-02 NOTE — Assessment & Plan Note (Addendum)
Diabetes: HgbA1c ordered, 7.1 up from 6.9. No changes indicated.  Continue to inject for 4 units of Lantus nightly Continue to inject 16 units of Novolin before breakfast before lunch 14 units before dinner.

## 2017-04-02 NOTE — Assessment & Plan Note (Signed)
Peripheral vascular disease/claudication: Patient was seen and evaluated by vascular and determined she had a 50-74% occlusion of the left superficial femoral artery.  This may be contributing to her leg pain after initiating ambulation repeat of time.  Symptoms are consistent with claudication described as aching pain in the posterior portion of her cath and distal posterior thigh following significant walking.  The pain eases at rest as improved with ceasing of activity.  There is no pain on palpation to the associated area. Vascular recommended a graduated walking program.  Will place referral to cardiac rehab to assist her with this.  Patient informed to call back if the pain worsens, or fevers or fail to resolve, of her extremities demonstrate blue discoloration or extreme pain.

## 2017-04-02 NOTE — Assessment & Plan Note (Signed)
HLD: Lipid profile ordered for cholesterol 151, triglyceride 237, HDL 46, and LDL 58.  We will continue rosuvastatin 20 mg daily

## 2017-04-03 NOTE — Progress Notes (Signed)
Internal Medicine Clinic Attending  Case discussed with Dr. Harbrecht at the time of the visit.  We reviewed the resident's history and exam and pertinent patient test results.  I agree with the assessment, diagnosis, and plan of care documented in the resident's note.   

## 2017-04-09 ENCOUNTER — Telehealth: Payer: Self-pay | Admitting: *Deleted

## 2017-04-09 NOTE — Telephone Encounter (Signed)
SPOKE WITH HANNAH, REFERRAL IN REVIEW.

## 2017-04-10 ENCOUNTER — Telehealth (HOSPITAL_COMMUNITY): Payer: Self-pay

## 2017-04-10 NOTE — Telephone Encounter (Signed)
Patients insurance is active and benefits verified through [place] - $15.00 co-pay, no deductible, out of pocket amount of $3,400/$40.00 has been met, no co-insurance, and no pre-authorization is required. Reference [phone]

## 2017-04-14 ENCOUNTER — Other Ambulatory Visit: Payer: Self-pay

## 2017-04-14 NOTE — Telephone Encounter (Signed)
Pt needs to speak with a nurse about getting a different bp med. Please call pt back.

## 2017-04-14 NOTE — Telephone Encounter (Signed)
LM to call us back

## 2017-04-14 NOTE — Telephone Encounter (Signed)
Patient was told by [facility] that Olmesartan was not available @ the time it was ordered by MD. Requesting an alternative. Cucumber & was informed med was ordered for her & is now available.  Left msg to call her pharmacy & call us back for any other questions/issues.

## 2017-04-22 NOTE — Addendum Note (Signed)
Addended by: Nicola Girt on: 04/22/2017 04:12 PM   Modules accepted: Orders

## 2017-04-28 ENCOUNTER — Other Ambulatory Visit: Payer: Self-pay | Admitting: *Deleted

## 2017-04-28 DIAGNOSIS — Z794 Long term (current) use of insulin: Principal | ICD-10-CM

## 2017-04-28 DIAGNOSIS — N183 Chronic kidney disease, stage 3 unspecified: Secondary | ICD-10-CM

## 2017-04-28 DIAGNOSIS — E1122 Type 2 diabetes mellitus with diabetic chronic kidney disease: Secondary | ICD-10-CM

## 2017-04-28 MED ORDER — INSULIN REGULAR HUMAN 100 UNIT/ML IJ SOLN: INTRAMUSCULAR | 11 refills | Status: DC

## 2017-05-14 NOTE — Progress Notes (Signed)
Called patient to discuss her Echo order. She was unsure as to when this was to be scheduled and stated that she was unaware of receiving a call with an anticipated procedure date.   Please assist with scheduling the Echo ASAP so that we may better assist her.   Thank you,  [name], MD Internal Medicine, PGY-1 Pager # [phone]

## 2017-05-18 ENCOUNTER — Other Ambulatory Visit: Payer: Self-pay | Admitting: Internal Medicine

## 2017-06-10 ENCOUNTER — Encounter: Payer: Self-pay | Admitting: Internal Medicine

## 2017-06-10 ENCOUNTER — Other Ambulatory Visit: Payer: Self-pay

## 2017-06-10 ENCOUNTER — Ambulatory Visit (INDEPENDENT_AMBULATORY_CARE_PROVIDER_SITE_OTHER): Payer: PPO | Admitting: Internal Medicine

## 2017-06-10 VITALS — BP 135/50 | HR 58 | Temp 98.0°F | Ht 62.0 in | Wt 140.3 lb

## 2017-06-10 DIAGNOSIS — N183 Chronic kidney disease, stage 3 unspecified: Secondary | ICD-10-CM

## 2017-06-10 DIAGNOSIS — Z794 Long term (current) use of insulin: Secondary | ICD-10-CM | POA: Diagnosis not present

## 2017-06-10 DIAGNOSIS — I5032 Chronic diastolic (congestive) heart failure: Secondary | ICD-10-CM

## 2017-06-10 DIAGNOSIS — I130 Hypertensive heart and chronic kidney disease with heart failure and stage 1 through stage 4 chronic kidney disease, or unspecified chronic kidney disease: Secondary | ICD-10-CM

## 2017-06-10 DIAGNOSIS — E118 Type 2 diabetes mellitus with unspecified complications: Secondary | ICD-10-CM

## 2017-06-10 DIAGNOSIS — E1122 Type 2 diabetes mellitus with diabetic chronic kidney disease: Secondary | ICD-10-CM | POA: Diagnosis not present

## 2017-06-10 DIAGNOSIS — I10 Essential (primary) hypertension: Secondary | ICD-10-CM

## 2017-06-10 DIAGNOSIS — I739 Peripheral vascular disease, unspecified: Secondary | ICD-10-CM | POA: Diagnosis not present

## 2017-06-10 LAB — POCT GLYCOSYLATED HEMOGLOBIN (HGB A1C): Hemoglobin A1C: 7.3 % — AB (ref 4.0–5.6)

## 2017-06-10 LAB — GLUCOSE, CAPILLARY: Glucose-Capillary: 253 mg/dL — ABNORMAL HIGH (ref 65–99)

## 2017-06-10 MED ORDER — INSULIN GLARGINE 100 UNIT/ML SOLOSTAR PEN: PEN_INJECTOR | SUBCUTANEOUS | 11 refills | Status: DC

## 2017-06-10 NOTE — Progress Notes (Signed)
Medicine attending: Medical history, presenting problems, physical findings, and medications, reviewed with resident physician Dr Lawrence Harbrecht on the day of the patient visit and I concur with his evaluation and management plan. 

## 2017-06-10 NOTE — Assessment & Plan Note (Signed)
  HFpEF: Last echocardiogram performed January 03, 2015 indicated LVEF of 55 to 60% with grade 1 diastolic dysfunction, calcified annulus of the mitral valve, mildly dilated left atrium and the PA pressure 35 mmHg. Able to repeat echocardiogram, however, patient has not is completed as of her most recent visit as she was never contacted by the department. Patient denied nausea, vomiting, dyspnea on exertion, chest pain, chills, fever, bilateral lower extremity edema, chronic.  Plan: Continue atenolol 25 mg daily Continue furosemide 40 mg daily Follow-up echo when completed by patient

## 2017-06-10 NOTE — Assessment & Plan Note (Signed)
  Diabetes: Last to move A1c 7.1 up slightly from 6.9 no changes indicated or made at that time.  Plan: Hemoglobin A1c ordered for today to compare to her prior measurements--up to 7.3 Continue Lantus nightly but increase to 46U Continue to inject 16 units of Novolin before breakfast and 14 units before dinner. Completed diabetic foot exam

## 2017-06-10 NOTE — Patient Instructions (Addendum)
FOLLOW-UP INSTRUCTIONS When: three months For: Routine visit What to bring: All of your medications  Thank you for your visit to the [facility] today.   I will increase your lantus to 46U daily due to the increasing A1c to 7.3 today but will need your glucometer readings to better adjust your regimen. NO other changes are being made today.   We will have someone call you with regard to your echocardiogram that was ordered.  Please contact us with any questions or concerns.

## 2017-06-10 NOTE — Assessment & Plan Note (Signed)
PAD: Stable. Follows with Vascular, scheduled for doppler US on 6/21. NO acute changes.

## 2017-06-10 NOTE — Assessment & Plan Note (Signed)
  CKD stage III: Repeat BMP today Most recent microalbumin to creatinine ratio 124.3 appears stable when compared to the value pain approximate 6 years prior.  Although this is out of the range of normal so markedly elevated in which repeat in approximately 9 months

## 2017-06-10 NOTE — Progress Notes (Signed)
CC: Routine evaluation of her diabetes, hypertension, and CHF  HPI:[name] who presents today for routine evaluation of her chronic medical conditions as below.  Hypertension: BP today at 135/50. NO changes indicated  Plan: Continue Amlodipine 10 mg daily Continue atenolol 12.5 mg daily  Diabetes: Last to move A1c 7.1 up slightly from 6.9 no changes indicated or made at that time.  Plan: Hemoglobin A1c ordered for today to compare to her prior measurements--up to 7.3 Continue Lantus nightly but increase to 46U Continue to inject 16 units of Novolin before breakfast and 14 units before dinner. Completed diabetic foot exam  PAD: Stable. Follows with Vascular, scheduled for doppler US on 6/21. NO acute changes.   HFpEF: Last echocardiogram performed January 03, 2015 indicated LVEF of 55 to 60% with grade 1 diastolic dysfunction, calcified annulus of the mitral valve, mildly dilated left atrium and the PA pressure 35 mmHg. Able to repeat echocardiogram, however, patient has not is completed as of her most recent visit as she was never contacted by the department. Patient denied nausea, vomiting, dyspnea on exertion, chest pain, chills, fever, bilateral lower extremity edema, chronic.  Plan: Continue atenolol 25 mg daily Continue furosemide 40 mg daily Follow-up echo when completed by patient  CKD stage III: Repeat BMP today Most recent microalbumin to creatinine ratio 124.3 appears stable when compared to the value pain approximate 6 years prior.  Although this is out of the range of normal so markedly elevated in which repeat in approximately 9 months  Past Medical History:  Diagnosis Date  . CAD (coronary artery disease) 2006   s/p Quadrupal CABG 2006  . CHF (congestive heart failure) ([place]) 2010   EF 30-35% from Echo 06/2008  . CKD (chronic kidney disease) stage 4, GFR 15-29 ml/min (HCC)    fluctuating between stage 3 and 4 depending on  GFR  . Diverticulosis   . DIVERTICULOSIS OF COLON 04/01/2007   Qualifier: Diagnosis of  By: Nelson-Smith CMA (AAMA), [name]    . 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 History of colonic polyps   . HLD (hyperlipidemia)   . HTN (hypertension)   . Lumbar spinal stenosis   . MI (myocardial infarction) ([place]) 2003   . [place]  . Mouth ulcer adjacent to jaw bone 08/22/2010   S/p removal by ENT 09/2010   . MYOCARDIAL INFARCTION, HX OF 09/09/2006   Qualifier: Diagnosis of  By: Marinda Elk MD, Sonia Side    . Nephrolithiasis    hx of  . NEPHROLITHIASIS 04/01/2007   Qualifier: Diagnosis of  By: Harlon Ditty CMA (AAMA), [name]    . OA (osteoarthritis)   . Osteopenia 2010   T score of -1.8  . Psoriasis   . PVD (peripheral vascular disease) (HCC)    ABI indicated moderated reduction arterial flow on the left.  . Tricuspid regurgitation 2009   moderate to severe, EF 55%   Review of Systems:  ROS negative except as per HPI.  Physical Exam:  Vitals:   06/10/17 1413  BP: (!) 135/50  Pulse: (!) 58  Temp: 98 F (36.7 C)  TempSrc: Oral  SpO2: 98%  Weight: 140 lb 4.8 oz (63.6 kg)  Height: 5\' 2"  (1.575 m)   Physical Exam  Constitutional: She is oriented to person, place, and time. She appears well-developed and well-nourished. No distress.  HENT:  Head: Normocephalic and atraumatic.  Cardiovascular: Normal rate and regular rhythm.  Murmur heard.  Pulmonary/Chest: Effort normal and breath sounds normal. No stridor. No respiratory distress.  Abdominal: Soft. Bowel sounds are normal. She exhibits no distension. There is no tenderness.  Musculoskeletal: She exhibits no edema or tenderness.  Neurological: She is alert and oriented to person, place, and time.  Skin: Skin is warm. Capillary refill takes less than 2 seconds. She is not diaphoretic.  Psychiatric: She has a normal mood and affect.    Assessment & Plan:   See Encounters Tab for problem based charting.  Patient  discussed with Dr. Beryle Beams

## 2017-06-10 NOTE — Assessment & Plan Note (Signed)
  Hypertension: BP today at 135/50. NO changes indicated  Plan: Continue Amlodipine 10 mg daily Continue atenolol 12.5 mg daily

## 2017-06-11 ENCOUNTER — Encounter: Payer: Self-pay | Admitting: Internal Medicine

## 2017-06-11 LAB — BMP8+ANION GAP
Anion Gap: 16 mmol/L (ref 10.0–18.0)
BUN/Creatinine Ratio: 13 (ref 12–28)
BUN: 17 mg/dL (ref 8–27)
CHLORIDE: 100 mmol/L (ref 96–106)
CO2: 26 mmol/L (ref 20–29)
CREATININE: 1.36 mg/dL — AB (ref 0.57–1.00)
Calcium: 9.3 mg/dL (ref 8.7–10.3)
GFR calc non Af Amer: 37 mL/min/{1.73_m2} — ABNORMAL LOW (ref >59)
GFR, EST AFRICAN AMERICAN: 43 mL/min/{1.73_m2} — AB (ref >59)
GLUCOSE: 244 mg/dL — AB (ref 65–99)
Potassium: 4.8 mmol/L (ref 3.5–5.2)
Sodium: 142 mmol/L (ref 134–144)

## 2017-06-14 ENCOUNTER — Other Ambulatory Visit: Payer: Self-pay | Admitting: Internal Medicine

## 2017-06-14 DIAGNOSIS — I10 Essential (primary) hypertension: Secondary | ICD-10-CM

## 2017-06-15 ENCOUNTER — Other Ambulatory Visit: Payer: Self-pay | Admitting: *Deleted

## 2017-06-15 DIAGNOSIS — Z951 Presence of aortocoronary bypass graft: Secondary | ICD-10-CM

## 2017-06-15 DIAGNOSIS — E785 Hyperlipidemia, unspecified: Secondary | ICD-10-CM

## 2017-06-17 ENCOUNTER — Other Ambulatory Visit: Payer: Self-pay

## 2017-06-17 DIAGNOSIS — I779 Disorder of arteries and arterioles, unspecified: Secondary | ICD-10-CM

## 2017-06-17 MED ORDER — ROSUVASTATIN CALCIUM 20 MG PO TABS: ORAL_TABLET | ORAL | 3 refills | Status: DC

## 2017-06-25 ENCOUNTER — Ambulatory Visit (HOSPITAL_COMMUNITY)
Admission: RE | Admit: 2017-06-25 | Discharge: 2017-06-25 | Disposition: A | Discharge status: Home / Self Care | Payer: PPO | Source: Ambulatory Visit | Attending: Internal Medicine | Admitting: Internal Medicine

## 2017-06-25 DIAGNOSIS — R0601 Orthopnea: Secondary | ICD-10-CM | POA: Diagnosis not present

## 2017-06-25 DIAGNOSIS — R609 Edema, unspecified: Secondary | ICD-10-CM | POA: Diagnosis not present

## 2017-06-25 DIAGNOSIS — Z951 Presence of aortocoronary bypass graft: Secondary | ICD-10-CM | POA: Insufficient documentation

## 2017-06-25 DIAGNOSIS — I5030 Unspecified diastolic (congestive) heart failure: Secondary | ICD-10-CM | POA: Insufficient documentation

## 2017-06-25 DIAGNOSIS — I081 Rheumatic disorders of both mitral and tricuspid valves: Secondary | ICD-10-CM | POA: Diagnosis not present

## 2017-06-25 DIAGNOSIS — R0609 Other forms of dyspnea: Secondary | ICD-10-CM | POA: Diagnosis not present

## 2017-06-25 DIAGNOSIS — I371 Nonrheumatic pulmonary valve insufficiency: Secondary | ICD-10-CM | POA: Insufficient documentation

## 2017-06-25 NOTE — Progress Notes (Signed)
  Echocardiogram 2D Echocardiogram has been performed.  Johny Chess 06/25/2017, 10:58 AM

## 2017-07-01 ENCOUNTER — Encounter (HOSPITAL_COMMUNITY): Payer: PPO

## 2017-07-01 ENCOUNTER — Ambulatory Visit: Payer: PPO | Admitting: Family

## 2017-07-02 ENCOUNTER — Encounter (HOSPITAL_COMMUNITY): Payer: PPO

## 2017-07-02 ENCOUNTER — Ambulatory Visit: Payer: PPO | Admitting: Family

## 2017-07-03 ENCOUNTER — Ambulatory Visit (INDEPENDENT_AMBULATORY_CARE_PROVIDER_SITE_OTHER)
Admission: RE | Admit: 2017-07-03 | Discharge: 2017-07-03 | Disposition: A | Discharge status: Home / Self Care | Payer: PPO | Source: Ambulatory Visit | Attending: Family | Admitting: Family

## 2017-07-03 ENCOUNTER — Encounter: Payer: Self-pay | Admitting: Family

## 2017-07-03 ENCOUNTER — Ambulatory Visit (INDEPENDENT_AMBULATORY_CARE_PROVIDER_SITE_OTHER): Payer: PPO | Admitting: Family

## 2017-07-03 ENCOUNTER — Ambulatory Visit (HOSPITAL_COMMUNITY)
Admission: RE | Admit: 2017-07-03 | Discharge: 2017-07-03 | Disposition: A | Discharge status: Home / Self Care | Payer: PPO | Source: Ambulatory Visit | Attending: Family | Admitting: Family

## 2017-07-03 ENCOUNTER — Other Ambulatory Visit: Payer: Self-pay

## 2017-07-03 VITALS — BP 154/72 | HR 50 | Resp 18 | Ht 62.0 in | Wt 137.6 lb

## 2017-07-03 DIAGNOSIS — I779 Disorder of arteries and arterioles, unspecified: Secondary | ICD-10-CM

## 2017-07-03 DIAGNOSIS — Z95828 Presence of other vascular implants and grafts: Secondary | ICD-10-CM

## 2017-07-03 DIAGNOSIS — I700 Atherosclerosis of aorta: Secondary | ICD-10-CM | POA: Diagnosis not present

## 2017-07-03 DIAGNOSIS — I6523 Occlusion and stenosis of bilateral carotid arteries: Secondary | ICD-10-CM | POA: Insufficient documentation

## 2017-07-03 DIAGNOSIS — E1151 Type 2 diabetes mellitus with diabetic peripheral angiopathy without gangrene: Secondary | ICD-10-CM

## 2017-07-03 NOTE — Progress Notes (Signed)
[facility] [place]   CC: Follow up peripheral artery occlusive disease  History of Present Illness [name] is a 77 y.o. female whom Dr. Scot Dock evaluated for progressive ischemia of the left lower extremity and significant rest pain. She had no left femoral pulse. Of note her ABI was 25% and on exam she had evidence of multilevel arterial occlusive disease.  She was taken to the peripheral vascular lab on 07/03/2014. She underwent PTA and 6 mm x 29 mm stent of the left common iliac artery for a chronic total occlusion. There was no residual stenosis.  Pt returned on 11-27-16 after received phone call on 11-20-16 from[facility]ie at [facility] who wants to refer patient to be seen in [facility] office. Patient seen at [facility] and had ABI's done(were 0.99 bilaterally, review of records)for c/o left leg pain and swelling. Negative DVT duplex on 11-27-16.   She denies any known back problems, but her low back hurts if she walks longer than usual. Cold weather makes her neck and low back feel stiff.  Both groins hurt after walking about 200 feet on a slight incline, relieved by rest.   She returned on 01-23-17 with c/o increasing pain in left anterior thigh, starting in December 2018, hurts with sitting or walking. Occasionally pain will radiate from her left buttock posteriorly and distally to left foot. She denies any known back problems, but her documented PMHx includes lumbar spinal stenosis. She denied any long periods of sitting, travel, or immobilization. She had a negative DVT study in November 2018.   She denies chest pain or headache, states her dyspnea is no worse than usual.  She was seen by her PCP in December 2018 for cough.   GFR was 37, serum creatinine was 1.36 on 06-10-17, stage 3B CKD.    Pt denies any hx of stroke or TIA.   Diabetic: Yes, A1C was 7.3 on 06-10-17(review of records) Tobacco use: non-smoker  Pt meds  include: Statin :Yes Betablocker: Yes ASA: Yes Other anticoagulants/antiplatelets: Plavix     Past Medical History:  Diagnosis Date  . CAD (coronary artery disease) 2006   s/p Quadrupal CABG 2006  . CHF (congestive heart failure) ([place]) 2010   EF 30-35% from Echo 06/2008  . CKD (chronic kidney disease) stage 4, GFR 15-29 ml/min (HCC)    fluctuating between stage 3 and 4 depending on GFR  . Diverticulosis   . DIVERTICULOSIS OF COLON 04/01/2007   Qualifier: Diagnosis of  By: Nelson-Smith CMA (AAMA), [name]    . 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 History of colonic polyps   . HLD (hyperlipidemia)   . HTN (hypertension)   . Lumbar spinal stenosis   . MI (myocardial infarction) ([place]) 2003   . [place]  . Mouth ulcer adjacent to jaw bone 08/22/2010   S/p removal by ENT 09/2010   . MYOCARDIAL INFARCTION, HX OF 09/09/2006   Qualifier: Diagnosis of  By: Marinda Elk MD, Sonia Side    . Nephrolithiasis    hx of  . NEPHROLITHIASIS 04/01/2007   Qualifier: Diagnosis of  By: Harlon Ditty CMA (AAMA), [name]    . OA (osteoarthritis)   . Osteopenia 2010   T score of -1.8  . Psoriasis   . PVD (peripheral vascular disease) (HCC)    ABI indicated moderated reduction arterial flow on the left.  . Tricuspid regurgitation 2009   moderate to severe, EF 55%    Social History  Social History   Tobacco Use  . Smoking status: Never Smoker  . Smokeless tobacco: Never Used  Substance Use Topics  . Alcohol use: No    Alcohol/week: 0.0 oz  . Drug use: No    Family History Family History  Problem Relation Age of Onset  . Cirrhosis Brother   . Heart disease Brother   . Heart attack Brother   . Hypertension Mother   . Hyperlipidemia Mother   . Hyperlipidemia Son   . Hypertension Son     Past Surgical History:  Procedure Laterality Date  . CATARACT EXTRACTION  2008   left eye  . CORONARY ANGIOPLASTY WITH STENT PLACEMENT  2003  . CORONARY ARTERY BYPASS GRAFT  2006   4  vessel  . LAPAROSCOPIC SUPRACERVICAL HYSTERECTOMY    . PERIPHERAL VASCULAR CATHETERIZATION N/A 07/03/2014   Procedure: Abdominal Aortogram w/Lower Extremity;  Surgeon: Angelia Mould, MD;  Location: [facility];  Service: Cardiovascular;  Laterality: N/A;    Allergies  Allergen Reactions  . Valsartan Cough    Current Outpatient Medications  Medication Sig Dispense Refill  . ACCU-CHEK FASTCLIX LANCETS MISC Check blood sugar 3 times a day 102 each 12  . amLODipine (NORVASC) 10 MG tablet Take 1 tablet (10 mg total) by mouth daily. 90 tablet 3  . aspirin EC 81 MG tablet Take 81 mg by mouth daily.    Marland Kitchen atenolol (TENORMIN) 25 MG tablet Take 0.5 tablets (12.5 mg total) by mouth daily. 45 tablet 3  . B-D INS SYR HALF-UNIT .3CC/31G 31G X 5/16" 0.3 ML MISC     . BD PEN NEEDLE NANO U/F 32G X 4 MM MISC USE FOUR TIMES DAILY AS DIRECTED( BEFORE MEALS AND AT BEDTIME AS DIRECTED) 100 each 0  . benzonatate (TESSALON) 100 MG capsule Take 1-2 capsules (100-200 mg total) by mouth 3 (three) times daily as needed for cough. 40 capsule 0  . Blood Glucose Monitoring Suppl (FREESTYLE LITE) DEVI Check blood sugar up to 4 times a day 1 each 0  . Calcium Carb-Cholecalciferol (CALCIUM 600 + D PO) Take 1 tablet by mouth 2 (two) times daily.    . clopidogrel (PLAVIX) 75 MG tablet Take 1 tablet (75 mg total) by mouth daily. 90 tablet 3  . diclofenac sodium (VOLTAREN) 1 % GEL Apply 2 g topically 4 (four) times daily. 100 g 3  . famotidine (PEPCID) 20 MG tablet TAKE 1 TABLET(20 MG) BY MOUTH TWICE DAILY 180 tablet 3  . furosemide (LASIX) 40 MG tablet TAKE 1 TABLET(40 MG) BY MOUTH DAILY 90 tablet 0  . glucose blood (FREESTYLE LITE) test strip 1 each by Other route 4 (four) times daily -  before meals and at bedtime. Diagnosis code E11.8, Z79.4 300 each 4  . guaiFENesin (MUCINEX) 600 MG 12 hr tablet Take 1 tablet (600 mg total) by mouth 2 (two) times daily. 60 tablet 0  . HUMIRA PEN 40 MG/0.8ML PNKT     .  Insulin Glargine (LANTUS SOLOSTAR) 100 UNIT/ML Solostar Pen INJECT 46 UNITS INTO THE SKIN DAILY AT 10PM 45 mL 11  . insulin regular (NOVOLIN R) 100 units/mL injection INJECT 16 UNITS BEFORE BREAKFAST, 16 UNITS BEFORE LUNCH AND 14 UNITS BEFORE DINNER 15 mL 11  . Insulin Syringe-Needle U-100 (BD VEO INSULIN SYR ULTRAFINE) 31G X 15/64" 0.3 ML MISC U TO INJ MEALTIME INSULIN TID    . Insulin Syringe-Needle U-100 31G X 15/64" 0.3 ML MISC Use to inject mealtime insulin 3 times  a day Dx code 250.00 insulin requiring 300 each 3  . Lancets (FREESTYLE) lancets 1 each by Other route 4 (four) times daily -  before meals and at bedtime. 100 each 11  . nitroGLYCERIN (NITROSTAT) 0.3 MG SL tablet Place 1 tablet (0.3 mg total) under the tongue every 5 (five) minutes as needed for chest pain. 90 tablet 12  . olmesartan (BENICAR) 5 MG tablet Take 1 tablet (5 mg total) by mouth daily. 90 tablet 0  . potassium chloride (K-DUR) 10 MEQ tablet Take 1 tablet (10 mEq total) by mouth 2 (two) times daily. 180 tablet 3  . rosuvastatin (CRESTOR) 20 MG tablet TAKE 1 TABLET(20 MG) BY MOUTH DAILY 90 tablet 3   No current facility-administered medications for this visit.     ROS: See HPI for pertinent positives and negatives.   Physical Examination  Vitals:   07/03/17 1055 07/03/17 1057  BP: (!) 159/70 (!) 154/72  Pulse: (!) 50   Resp: 18   SpO2: 94%   Weight: 137 lb 9.6 oz (62.4 kg)   Height: 5\' 2"  (1.575 m)    Body mass index is 25.17 kg/m.  General: A&O x 3, WDWN, female. Gait: normal HENT: no gross abnormalities  Eyes: PERRLA. Pulmonary: Respirations are non labored, CTAB, good air movement in all fields, no wheezes, rales,or rhonchi.  Cardiac: regular rhythm, no detected murmur.     Carotid Bruits Right Left   negative negative   Abdominal aortic pulse is not palpable. Radial pulses: 2+ palpable and =   VASCULAR EXAM: Extremitieswithout ischemic changes, without  Gangrene; without open wounds.No peripheral edema, no cellulitis of legs.       LE Pulses Right Left   FEMORAL 2+ palpable 3+ palpable    POPLITEAL not palpable  not palpable   POSTERIOR TIBIAL not palpable  not palpable    DORSALIS PEDIS  ANTERIOR TIBIAL not palpable  notpalpable    Abdomen:soft, NT, no palpable masses. Musculoskeletal:no muscle wasting or atrophy. Skin: no rashes, no cellulitis, no ulcers noted. Neurologic: A&O X 3; appropriate affect, Sensation is normal; MOTOR FUNCTION:  moving all extremities equally, motor strength 5/5 5/5 in upper extremities, 4/5 in LE's. Speech is fluent/normal. CN 2-12 intact. Psychiatric: Thought content is normal, mood appropriate for clinical situation.     ASSESSMENT: [name] is a 77 y.o. female who is s/p stent placement in the left common iliac artery for a chronic total occlusion on 07/03/14.  Occasionally pain will radiate from her left buttock posteriorly and distally to left foot. She denies any known back problems, but her documented PMHx includes lumbar spinal stenosis. She denies any long periods of sitting, travel, or immobilization. She had a negative DVT study in November 2018.    No history of stroke or TIA. Her atherosclerotic risk factors include almost in controle DM, stage 3B CKD,and CAD.  Fortunately she has never used tobacco.  She takes a daily statin, ASA, and Plavix.   She does have varicosities noted on venous duplex that are the likely source of pain in her calf the longer she stands. I advised knee high graduated compression hose to help with these symptoms.  At pt visit on 11-27-16 I discussed with Dr. Thana Farr pt had 99% bilateral ABI on 11-20-16, that her DVT duplex was negative for DVT, that  her bilateral femoral pulses are palpable, and that she had a 519 cm/s velocity at the left proximal SFA. Pt has more than adequate collateral perfusion around  the stenosis to achieve a 99% ABI on 11-20-16.  Dr. Scot Dock advised pt to perform graduated walking program, return in 6 months to evaluate left aortoiliac arteries (s/p left CIA stent with PTA on 07-03-14).  I advised pt to discuss with her PCP a possible evaluation of her lumbar spine and or left hip as the source of pain and sx's.   DATA  Carotid Duplex (07-03-17): Bilateral ICA with 40-59% stenosis. Bilateral vertebral artery flow is antegrade.  Bilateral subclavian artery waveforms are normal.  Increased stenosis in the right ICA compared to the exam on 06-25-16.    Left Aortoiliac Duplex (07-03-17): Left CIA stent with 332 cm/s PSV at the proximal stent. Biphasic waveforms except at distal stent which is monophasic.    Left LE Arterial Duplex (11/27/16): Velocities in the proximal to mid segment of the left SFA suggest 50-74% stenosis, with diffuse plaque throughout the vessel. Minimal color flow with low velocities in the left posterior tibial artery with visualized collaterals suggest occlusion or near occlusion (519 cm/s).  Left anterior tibial artery and peroneal artery appear patent.   ABI (Date: 07/03/2017):  R:   ABI: 0.86 (was 0.91 on 06-25-16),   PT: mono  DP: mono  TBI:  0.85 (was 0.58)  L:   ABI: 0.82 (was 0.72),   PT: bi  DP: bi  TBI: 0.91 (was 0.67)  Stable ABI on the right with mild disease and monophasic waveforms, improved on the left with mild disease and biphasic waveforms. Improved bilateral TBI.    Left LE DVT duplex (11/27/16): Negative for DVT, and negative for superficial venous thrombosis. Varicosities noted in the left popliteal fossa and calf. Left profunda and tibial veins appear dilated, without thrombus visualized.     PLAN:   Graduated walking program discussed and  how to achieve.  Based on the patient's vascular studies and examination, and after discussing with Dr. Donzetta Matters, pt will return to clinic in 1 year with ABI's, left aortoiliac arterial duplex,and carotid duplex.  I advised her to notify us if she develops concerns re the circulation in her feet or legs   I discussed in depth with the patient the nature of atherosclerosis, and emphasized the importance of maximal medical management including strict control of blood pressure, blood glucose, and lipid levels, obtaining regular exercise, and continued cessation of smoking.  The patient is aware that without maximal medical management the underlying atherosclerotic disease process will progress, limiting the benefit of any interventions.  The patient was given information about PAD including signs, symptoms, treatment, what symptoms should prompt the patient to seek immediate medical care, and risk reduction measures to take.  Clemon Chambers, RN, MSN, FNP-C [facility] Phone: [phone]  Clinic MD: Donzetta Matters  07/03/17 12:19 PM

## 2017-07-03 NOTE — Patient Instructions (Addendum)
Peripheral Vascular Disease Peripheral vascular disease (PVD) is a disease of the blood vessels that are not part of your heart and brain. A simple term for PVD is poor circulation. In most cases, PVD narrows the blood vessels that carry blood from your heart to the rest of your body. This can result in a decreased supply of blood to your arms, legs, and internal organs, like your stomach or kidneys. However, it most often affects a person's lower legs and feet. There are two types of PVD.  Organic PVD. This is the more common type. It is caused by damage to the structure of blood vessels.  Functional PVD. This is caused by conditions that make blood vessels contract and tighten (spasm).  Without treatment, PVD tends to get worse over time. PVD can also lead to acute ischemic limb. This is when an arm or limb suddenly has trouble getting enough blood. This is a medical emergency. Follow these instructions at home:  Take medicines only as told by your doctor.  Do not use any tobacco products, including cigarettes, chewing tobacco, or electronic cigarettes. If you need help quitting, ask your doctor.  Lose weight if you are overweight, and maintain a healthy weight as told by your doctor.  Eat a diet that is low in fat and cholesterol. If you need help, ask your doctor.  Exercise regularly. Ask your doctor for some good activities for you.  Take good care of your feet. ? Wear comfortable shoes that fit well. ? Check your feet often for any cuts or sores. Contact a doctor if:  You have cramps in your legs while walking.  You have leg pain when you are at rest.  You have coldness in a leg or foot.  Your skin changes.  You are unable to get or have an erection (erectile dysfunction).  You have cuts or sores on your feet that are not healing. Get help right away if:  Your arm or leg turns cold and blue.  Your arms or legs become red, warm, swollen, painful, or numb.  You have  chest pain or trouble breathing.  You suddenly have weakness in your face, arm, or leg.  You become very confused or you cannot speak.  You suddenly have a very bad headache.  You suddenly cannot see. This information is not intended to replace advice given to you by your health care provider. Make sure you discuss any questions you have with your health care provider. Document Released: 03/26/2009 Document Revised: 06/07/2015 Document Reviewed: 06/09/2013 Elsevier Interactive Patient Education  2017 Elsevier Inc.       Stroke Prevention Some health problems and behaviors may make it more likely for you to have a stroke. Below are ways to lessen your risk of having a stroke.  Be active for at least 30 minutes on most or all days.  Do not smoke. Try not to be around others who smoke.  Do not drink too much alcohol. ? Do not have more than 2 drinks a day if you are a man. ? Do not have more than 1 drink a day if you are a woman and are not pregnant.  Eat healthy foods, such as fruits and vegetables. If you were put on a specific diet, follow the diet as told.  Keep your cholesterol levels under control through diet and medicines. Look for foods that are low in saturated fat, trans fat, cholesterol, and are high in fiber.  If you have diabetes, follow   all diet plans and take your medicine as told.  Ask your doctor if you need treatment to lower your blood pressure. If you have high blood pressure (hypertension), follow all diet plans and take your medicine as told by your doctor.  If you are 18-39 years old, have your blood pressure checked every 3-5 years. If you are age 40 or older, have your blood pressure checked every year.  Keep a healthy weight. Eat foods that are low in calories, salt, saturated fat, trans fat, and cholesterol.  Do not take drugs.  Avoid birth control pills, if this applies. Talk to your doctor about the risks of taking birth control pills.  Talk to  your doctor if you have sleep problems (sleep apnea).  Take all medicine as told by your doctor. ? You may be told to take aspirin or blood thinner medicine. Take this medicine as told by your doctor. ? Understand your medicine instructions.  Make sure any other conditions you have are being taken care of.  Get help right away if:  You suddenly lose feeling (you feel numb) or have weakness in your face, arm, or leg.  Your face or eyelid hangs down to one side.  You suddenly feel confused.  You have trouble talking (aphasia) or understanding what people are saying.  You suddenly have trouble seeing in one or both eyes.  You suddenly have trouble walking.  You are dizzy.  You lose your balance or your movements are clumsy (uncoordinated).  You suddenly have a very bad headache and you do not know the cause.  You have new chest pain.  Your heart feels like it is fluttering or skipping a beat (irregular heartbeat). Do not wait to see if the symptoms above go away. Get help right away. Call your local emergency services (911 in [place] Do not drive yourself to the hospital. This information is not intended to replace advice given to you by your health care provider. Make sure you discuss any questions you have with your health care provider. Document Released: 07/01/2011 Document Revised: 06/07/2015 Document Reviewed: 07/02/2012 Elsevier Interactive Patient Education  2018 Elsevier Inc.  

## 2017-07-06 ENCOUNTER — Other Ambulatory Visit: Payer: Self-pay | Admitting: Internal Medicine

## 2017-07-06 DIAGNOSIS — I10 Essential (primary) hypertension: Secondary | ICD-10-CM

## 2017-07-21 ENCOUNTER — Other Ambulatory Visit: Payer: Self-pay | Admitting: Internal Medicine

## 2017-07-21 DIAGNOSIS — Z794 Long term (current) use of insulin: Principal | ICD-10-CM

## 2017-07-21 DIAGNOSIS — E1122 Type 2 diabetes mellitus with diabetic chronic kidney disease: Secondary | ICD-10-CM

## 2017-07-21 DIAGNOSIS — I739 Peripheral vascular disease, unspecified: Secondary | ICD-10-CM

## 2017-07-21 DIAGNOSIS — I10 Essential (primary) hypertension: Secondary | ICD-10-CM

## 2017-07-21 DIAGNOSIS — N183 Chronic kidney disease, stage 3 (moderate): Principal | ICD-10-CM

## 2017-07-22 MED ORDER — INSULIN PEN NEEDLE 32G X 4 MM MISC: 2 refills | Status: DC

## 2017-07-22 MED ORDER — FUROSEMIDE 40 MG PO TABS: ORAL_TABLET | ORAL | 1 refills | Status: DC

## 2017-09-23 ENCOUNTER — Encounter: Payer: PPO | Admitting: Internal Medicine

## 2017-10-01 ENCOUNTER — Other Ambulatory Visit: Payer: Self-pay | Admitting: Internal Medicine

## 2017-10-01 DIAGNOSIS — I10 Essential (primary) hypertension: Secondary | ICD-10-CM

## 2017-10-01 NOTE — Telephone Encounter (Signed)
Will address at her next visit. Needs BMP prior to continuing.

## 2017-10-01 NOTE — Telephone Encounter (Signed)
Next appt scheduled 10/2 with PCP.

## 2017-10-14 ENCOUNTER — Ambulatory Visit: Payer: PPO | Admitting: Pharmacist

## 2017-10-14 ENCOUNTER — Other Ambulatory Visit: Payer: Self-pay | Admitting: *Deleted

## 2017-10-14 ENCOUNTER — Encounter (INDEPENDENT_AMBULATORY_CARE_PROVIDER_SITE_OTHER): Payer: Self-pay

## 2017-10-14 ENCOUNTER — Ambulatory Visit (INDEPENDENT_AMBULATORY_CARE_PROVIDER_SITE_OTHER): Payer: PPO | Admitting: Internal Medicine

## 2017-10-14 ENCOUNTER — Other Ambulatory Visit: Payer: Self-pay

## 2017-10-14 ENCOUNTER — Encounter: Payer: Self-pay | Admitting: Internal Medicine

## 2017-10-14 VITALS — BP 115/52 | HR 61 | Temp 98.8°F | Wt 137.4 lb

## 2017-10-14 DIAGNOSIS — Z794 Long term (current) use of insulin: Secondary | ICD-10-CM

## 2017-10-14 DIAGNOSIS — I10 Essential (primary) hypertension: Secondary | ICD-10-CM

## 2017-10-14 DIAGNOSIS — Z951 Presence of aortocoronary bypass graft: Secondary | ICD-10-CM

## 2017-10-14 DIAGNOSIS — Z7982 Long term (current) use of aspirin: Secondary | ICD-10-CM

## 2017-10-14 DIAGNOSIS — R011 Cardiac murmur, unspecified: Secondary | ICD-10-CM

## 2017-10-14 DIAGNOSIS — Z7902 Long term (current) use of antithrombotics/antiplatelets: Secondary | ICD-10-CM

## 2017-10-14 DIAGNOSIS — K219 Gastro-esophageal reflux disease without esophagitis: Secondary | ICD-10-CM | POA: Diagnosis not present

## 2017-10-14 DIAGNOSIS — Z23 Encounter for immunization: Secondary | ICD-10-CM

## 2017-10-14 DIAGNOSIS — E118 Type 2 diabetes mellitus with unspecified complications: Secondary | ICD-10-CM | POA: Diagnosis not present

## 2017-10-14 DIAGNOSIS — I2510 Atherosclerotic heart disease of native coronary artery without angina pectoris: Secondary | ICD-10-CM

## 2017-10-14 DIAGNOSIS — Z79899 Other long term (current) drug therapy: Secondary | ICD-10-CM

## 2017-10-14 LAB — POCT GLYCOSYLATED HEMOGLOBIN (HGB A1C): HEMOGLOBIN A1C: 7.9 % — AB (ref 4.0–5.6)

## 2017-10-14 LAB — GLUCOSE, CAPILLARY: GLUCOSE-CAPILLARY: 110 mg/dL — AB (ref 70–99)

## 2017-10-14 MED ORDER — AMLODIPINE BESYLATE 10 MG PO TABS: 10.0000 mg | ORAL_TABLET | Freq: Every day | ORAL | 3 refills | Status: DC

## 2017-10-14 MED ORDER — LIRAGLUTIDE 18 MG/3ML ~~LOC~~ SOPN: 1.2000 mg | PEN_INJECTOR | Freq: Every day | SUBCUTANEOUS | 0 refills | Status: DC

## 2017-10-14 MED ORDER — INSULIN GLARGINE 100 UNIT/ML SOLOSTAR PEN: PEN_INJECTOR | SUBCUTANEOUS | 11 refills | Status: DC

## 2017-10-14 NOTE — Progress Notes (Signed)
   CC: Routine evaluation for HTN and diabetes  HPI:[name] is a 77 y.o. female who presents today for routine evaluation of her HTN and diabetes. Please see problem based A/P for details.   Diabetes: Previously moderately well controlled on Lantus QHS 40-46U and Novolog 16U BID WC. However, given the increase to 7.9% from 7.3% today We will make further adjustments. The patient endorses moderate dietary modifications and compliance with her insulin. She has elected to enrol in the CGM study.  Plan: Anastasio Auerbach is contraindicated due to her reduced GFR. Will order victoza daily and titrate this upward given the cardiac benefits associated. Hopefully we will be able to decrease the Lantus over time as well Repeat A1c at next visit. Decrease Lantus to 45U QHS Continue Rosuvastatin 20mg  daily CGM placed today.  HTN: Significant lower than at her last visit at 115/52. Patient remained asymptomatic however. Denied orthostatic symptoms. States that it fluctuates notably at home ranging from 656 to 812 systolic. Plan: Continue her Atenolol 25mg , Amlodipine 10mg , Olmesartan 5mg , and lasix 40mg  daily.  CABG/CAD: Denied recent chest pain or dyspnea on exertion. Tolerated her trip to [place], [place] the week prior to this visit including the drive.  Continue ASA/Plavix as per Vascular. Will attempt to reassess this at her next appointment.    Past Medical History:  Diagnosis Date  . CAD (coronary artery disease) 2006   s/p Quadrupal CABG 2006  . CHF (congestive heart failure) ([place]) 2010   EF 30-35% from Echo 06/2008  . CKD (chronic kidney disease) stage 4, GFR 15-29 ml/min (HCC)    fluctuating between stage 3 and 4 depending on GFR  . Diverticulosis   . DIVERTICULOSIS OF COLON 04/01/2007   Qualifier: Diagnosis of  By: Nelson-Smith CMA (AAMA), [name]    . 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 History of colonic polyps   . HLD (hyperlipidemia)   . HTN (hypertension)    . Lumbar spinal stenosis   . MI (myocardial infarction) ([place]) 2003   . [place]  . Mouth ulcer adjacent to jaw bone 08/22/2010   S/p removal by ENT 09/2010   . MYOCARDIAL INFARCTION, HX OF 09/09/2006   Qualifier: Diagnosis of  By: Marinda Elk MD, Sonia Side    . Nephrolithiasis    hx of  . NEPHROLITHIASIS 04/01/2007   Qualifier: Diagnosis of  By: Harlon Ditty CMA (AAMA), [name]    . OA (osteoarthritis)   . Osteopenia 2010   T score of -1.8  . Psoriasis   . PVD (peripheral vascular disease) (HCC)    ABI indicated moderated reduction arterial flow on the left.  . Tricuspid regurgitation 2009   moderate to severe, EF 55%   Review of Systems:  ROS negative except as per HPI.  Physical Exam:  Vitals:   10/14/17 1435  BP: (!) 115/52  Pulse: 61  Temp: 98.8 F (37.1 C)  TempSrc: Oral  SpO2: 95%  Weight: 137 lb 6.4 oz (62.3 kg)   General: A/O x4, in no acute distress, afebrile, nondiaphoretic  Cardio: RRR, GII systolic ejection murmur equally audible in the R/L upper sternal boarders  Pulm: CTA bilaterally MSK: Bilateral lower extremities nontender nonedematous   Assessment & Plan:   See Encounters Tab for problem based charting.  Patient discussed with Dr. Eppie Gibson

## 2017-10-14 NOTE — Patient Instructions (Signed)
FOLLOW-UP INSTRUCTIONS When: In three months or sooner in enrolled into the CGM program with Dr. Maudie Mercury For: Routine visit and A1c What to bring: All of your medications  Thank you for your visit to the [facility] today.  As we discussed, we will adjust your medications today to better control your diabetes. As always, please remember to reduce the amount of sugar, bread, potato's, sodas, sweat tea, and other starches that you eat daily and increase the amount of vegetables and lean meats.  As always, please call us with any questions or concerns.

## 2017-10-14 NOTE — Progress Notes (Signed)
Documentation for Freestyle Libre Pro Continuous glucose monitoring Freestyle Libre Pro CGM sensor placed and started on [name] who was identified by name and date of birth.  Patient was educated about wearing sensor, keeping food, activity and medication log and when to call office.She was educated about how to care for the sensor and not to have an MRI, CT or Diathermy while wearing the sensor. Follow up was arranged with the patient for 10/22/17.   Lot #:[id number] Serial #:[id number] Expiration Date:04/13/2018 [name], Student-PharmD 10/14/2017 3:39 PM.

## 2017-10-15 MED ORDER — ATENOLOL 25 MG PO TABS: ORAL_TABLET | ORAL | 3 refills | Status: DC

## 2017-10-15 MED ORDER — FAMOTIDINE 20 MG PO TABS: ORAL_TABLET | ORAL | 3 refills | Status: DC

## 2017-10-15 MED ORDER — OLMESARTAN MEDOXOMIL 5 MG PO TABS: ORAL_TABLET | ORAL | 0 refills | Status: DC

## 2017-10-15 MED ORDER — POTASSIUM CHLORIDE ER 10 MEQ PO TBCR: 10.0000 meq | EXTENDED_RELEASE_TABLET | Freq: Two times a day (BID) | ORAL | 3 refills | Status: DC

## 2017-10-15 NOTE — Assessment & Plan Note (Signed)
  CABG/CAD: Denied recent chest pain or dyspnea on exertion. Tolerated her trip to [place], [place] the week prior to this visit including the drive.  Continue ASA/Plavix as per Vascular. Will attempt to reassess this at her next appointment.

## 2017-10-15 NOTE — Assessment & Plan Note (Signed)
  HTN: Significant lower than at her last visit at 115/52. Patient remained asymptomatic however. Denied orthostatic symptoms. States that it fluctuates notably at home ranging from 897 to 915 systolic. Plan: Continue her Atenolol 25mg , Amlodipine 10mg , Olmesartan 5mg , and lasix 40mg  daily.

## 2017-10-15 NOTE — Assessment & Plan Note (Signed)
Refilled Famotidine

## 2017-10-15 NOTE — Assessment & Plan Note (Signed)
Diabetes: Previously moderately well controlled on Lantus QHS 40-46U and Novolog 16U BID WC. However, given the increase to 7.9% from 7.3% today We will make further adjustments. The patient endorses moderate dietary modifications and compliance with her insulin. She has elected to enrol in the CGM study.  Plan: Anastasio Auerbach is contraindicated due to her reduced GFR. Will order victoza daily and titrate this upward given the cardiac benefits associated. Hopefully we will be able to decrease the Lantus over time as well Repeat A1c at next visit. Decrease Lantus to 45U QHS Continue Rosuvastatin 20mg  daily CGM placed today.

## 2017-10-15 NOTE — Progress Notes (Signed)
Internal Medicine Clinic Attending  Case discussed with Dr. Harbrecht at the time of the visit.  We reviewed the resident's history and exam and pertinent patient test results.  I agree with the assessment, diagnosis, and plan of care documented in the resident's note.   

## 2017-10-19 ENCOUNTER — Other Ambulatory Visit: Payer: Self-pay | Admitting: Internal Medicine

## 2017-10-19 DIAGNOSIS — I10 Essential (primary) hypertension: Secondary | ICD-10-CM

## 2017-10-19 DIAGNOSIS — Z794 Long term (current) use of insulin: Principal | ICD-10-CM

## 2017-10-19 DIAGNOSIS — N183 Chronic kidney disease, stage 3 unspecified: Secondary | ICD-10-CM

## 2017-10-19 DIAGNOSIS — E1122 Type 2 diabetes mellitus with diabetic chronic kidney disease: Secondary | ICD-10-CM

## 2017-10-20 MED ORDER — "INSULIN SYRINGE 31G X 5/16"" 0.3 ML MISC": 11 refills | Status: DC

## 2017-10-22 ENCOUNTER — Other Ambulatory Visit: Payer: Self-pay

## 2017-10-22 ENCOUNTER — Encounter: Payer: Self-pay | Admitting: Internal Medicine

## 2017-10-22 ENCOUNTER — Ambulatory Visit (INDEPENDENT_AMBULATORY_CARE_PROVIDER_SITE_OTHER): Payer: PPO | Admitting: Internal Medicine

## 2017-10-22 ENCOUNTER — Ambulatory Visit (INDEPENDENT_AMBULATORY_CARE_PROVIDER_SITE_OTHER): Payer: PPO | Admitting: Pharmacist

## 2017-10-22 VITALS — BP 144/48 | HR 50 | Temp 97.8°F | Ht 60.0 in | Wt 138.1 lb

## 2017-10-22 DIAGNOSIS — E118 Type 2 diabetes mellitus with unspecified complications: Secondary | ICD-10-CM

## 2017-10-22 DIAGNOSIS — N183 Chronic kidney disease, stage 3 unspecified: Secondary | ICD-10-CM

## 2017-10-22 DIAGNOSIS — Z794 Long term (current) use of insulin: Secondary | ICD-10-CM

## 2017-10-22 DIAGNOSIS — E1122 Type 2 diabetes mellitus with diabetic chronic kidney disease: Secondary | ICD-10-CM

## 2017-10-22 MED ORDER — INSULIN REGULAR HUMAN 100 UNIT/ML IJ SOLN: 12.0000 [IU] | Freq: Three times a day (TID) | INTRAMUSCULAR | 11 refills | Status: DC

## 2017-10-22 MED ORDER — "INSULIN SYRINGE 31G X 5/16"" 0.3 ML MISC": 12.0000 [IU] | Freq: Three times a day (TID) | 11 refills | Status: DC

## 2017-10-22 NOTE — Progress Notes (Signed)
   CC: Diabetes  HPI:[name] who presents for evaluation of diabetes. Please see individual problem based A/P for details.  Diabetes: CGM data download #1. [name] wore the CGM for 8 days. The average reading was 183, % time in target was 51, % time below target was 2, and % time above target was. 47. Intervention will be to decrease her prandial insulin to 12U TID and have her pick up and take the Victoza. The patient will be scheduled to see Dr. Berline Lopes in [facility] in one week for a final appointment.  I was able to call and confirm that the Victoza was available for the patient.   Plan: As above  PHQ-9: Based on the patients    Office Visit from 10/14/2017 in [place]  PHQ-9 Total Score  6     score we have decided to monitor.  Past Medical History:  Diagnosis Date  . CAD (coronary artery disease) 2006   s/p Quadrupal CABG 2006  . CHF (congestive heart failure) ([place]) 2010   EF 30-35% from Echo 06/2008  . CKD (chronic kidney disease) stage 4, GFR 15-29 ml/min (HCC)    fluctuating between stage 3 and 4 depending on GFR  . Diverticulosis   . DIVERTICULOSIS OF COLON 04/01/2007   Qualifier: Diagnosis of  By: Nelson-Smith CMA (AAMA), [name]    . 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 History of colonic polyps   . HLD (hyperlipidemia)   . HTN (hypertension)   . Lumbar spinal stenosis   . MI (myocardial infarction) ([place]) 2003   . [place]  . Mouth ulcer adjacent to jaw bone 08/22/2010   S/p removal by ENT 09/2010   . MYOCARDIAL INFARCTION, HX OF 09/09/2006   Qualifier: Diagnosis of  By: Marinda Elk MD, Sonia Side    . Nephrolithiasis    hx of  . NEPHROLITHIASIS 04/01/2007   Qualifier: Diagnosis of  By: Harlon Ditty CMA (AAMA), [name]    . OA (osteoarthritis)   . Osteopenia 2010   T score of -1.8  . Psoriasis   . PVD (peripheral vascular disease) (HCC)    ABI indicated moderated reduction arterial flow  on the left.  . Tricuspid regurgitation 2009   moderate to severe, EF 55%   Review of Systems:  ROS negative except as per HPI.  Physical Exam: Vitals:   10/22/17 1440  BP: (!) 144/48  Pulse: (!) 50  Temp: 97.8 F (36.6 C)  TempSrc: Oral  SpO2: 95%  Weight: 138 lb 1.6 oz (62.6 kg)  Height: 5' (1.524 m)   General: A/O x4, in no acute distress, appear comfortable at rest Pysch: Mood and affect appropriate  Assessment & Plan:   See Encounters Tab for problem based charting.  Patient discussed with Dr. Dareen Piano

## 2017-10-22 NOTE — Patient Instructions (Signed)
FOLLOW-UP INSTRUCTIONS When: One week For: CGM follow up and download  I have changed how you take your insulin. Please only take 12U of the meal time insulin with each meal. Also, please start taking the Victoza today.   Thank you for your visit to the [name] today.

## 2017-10-22 NOTE — Patient Instructions (Signed)
Please record the time, amount and what food drinks and activities you have while wearing the continuous glucose monitor(CGM) in the folder provided.  Bring the folder with you to follow up appointments  Do not have a CT or an MRI while wearing the CGM.   Please make an appointment for 1 week with me and a doctor for the first of two CGM downloads..   You will also return in 2 weeks to have your second download and the CGM removed.  

## 2017-10-22 NOTE — Assessment & Plan Note (Signed)
Diabetes: CGM data download #1. [name] wore the CGM for 8 days. The average reading was 183, % time in target was 51, % time below target was 2, and % time above target was. 47. Intervention will be to decrease her prandial insulin to 12U TID and have her pick up and take the Victoza. The patient will be scheduled to see Dr. Berline Lopes in [facility] in one week for a final appointment.  I was able to call and confirm that the Victoza was available for the patient.   Plan: As above

## 2017-10-22 NOTE — Assessment & Plan Note (Signed)
Not addressed. Was tagged to the patients medication refills. This has been altered.

## 2017-10-22 NOTE — Progress Notes (Signed)
Patient was seen in a co-visit with PCP, Dr. Berline Lopes.   [name] sensor data was downloaded and analyzed today.   Above 180 47% of the time In target range 51% of the time Below 70 2% of the time  [name] has not yet started taking Victoza; discussed this with Dr. Berline Lopes with the recommendation to have her start Victoza at a dose of 0.6 mg weekly and titrating up to a target dose of 1.2 mg.

## 2017-10-23 NOTE — Progress Notes (Signed)
Internal Medicine Clinic Attending  Case discussed with Dr. Harbrecht at the time of the visit. We reviewed the resident's history and exam and pertinent patient test results. I personally reviewed the CGM data & the resident's interpretation. I agree with the assessment, diagnosis, and plan of care documented in the resident's note.   

## 2017-10-29 ENCOUNTER — Ambulatory Visit: Payer: PPO | Admitting: Dietician

## 2017-10-29 ENCOUNTER — Other Ambulatory Visit: Payer: Self-pay

## 2017-10-29 ENCOUNTER — Encounter: Payer: Self-pay | Admitting: Internal Medicine

## 2017-10-29 ENCOUNTER — Ambulatory Visit (INDEPENDENT_AMBULATORY_CARE_PROVIDER_SITE_OTHER): Payer: PPO | Admitting: Internal Medicine

## 2017-10-29 DIAGNOSIS — K5900 Constipation, unspecified: Secondary | ICD-10-CM | POA: Diagnosis not present

## 2017-10-29 DIAGNOSIS — Z794 Long term (current) use of insulin: Secondary | ICD-10-CM

## 2017-10-29 DIAGNOSIS — E118 Type 2 diabetes mellitus with unspecified complications: Secondary | ICD-10-CM

## 2017-10-29 MED ORDER — INSULIN GLARGINE 100 UNIT/ML SOLOSTAR PEN: PEN_INJECTOR | SUBCUTANEOUS | 11 refills | Status: DC

## 2017-10-29 MED ORDER — INSULIN GLARGINE 100 UNIT/ML SOLOSTAR PEN: 30.0000 [IU] | PEN_INJECTOR | Freq: Every day | SUBCUTANEOUS | 5 refills | Status: DC

## 2017-10-29 MED ORDER — LIRAGLUTIDE 18 MG/3ML ~~LOC~~ SOPN: 0.6000 mg | PEN_INJECTOR | Freq: Every day | SUBCUTANEOUS | 0 refills | Status: DC

## 2017-10-29 NOTE — Progress Notes (Addendum)
Documentation for CGM download 2:  Ms. Spindler says she does not feel well today and has been In bed two days because she feels so bad. She has felt bad since starting victoza. She reports constipation with no BM for 4 days.  Her CGM report was reviewed with her regarding her schedule. Unfortunately she did not bring food records and could not remember what she had eaten.  Her blood sugars are very well controlled on the Victoza with hypoglycemia which will be addressed in Dr. Nelma Rothman note. It would be ideal if she could decrease her insulin doses.However, do note recommend any weight loss with a BMI of 24 and her advanced age.  She is interested in Medical Nutrition Therapy annually if she does not get a charge like she did last time. I assured her there should not be a charge for Medical Nutrition Therapy.  Plan follow up as needed. Debera Lat, RD 10/29/2017 5:08 PM.

## 2017-10-29 NOTE — Progress Notes (Signed)
   CC: Diabetes  HPI:[name] is a 77 y.o. female who presents for evaluation of diabetes. Please see individual problem based A/P for details.  Diabetes: Berenda [name] wore the CGM for 7 days. She stated that she started taking the Victoza on the 11th. She started feeling "terrible" two days prior which is better characterized by fatigue, lightheaded, generally weak, mildly confused, and constipated. I feel strongly that this is due to hypoglycemia given her CGM data with tremendously improved and almost flat daily readings all within about 70-140 over the past 5 days with multiple episodes of lows as well. I feel as if she has responded well to the Victoza and will need far less insulin. She denied fever, chills, diarrhea, hematochezia, join pain or myalgias.   The average reading was 165, % time in target was 58, % time below target was 5, and % time above target was. 37. Intervention will be to stop the patients short acting insulin, decrease the long acting insulin to 30U daily and continue the liraglutide. The patient will be scheduled to see her PCP for a routine visit in 2 months. She will be called tomorrow and the Monday after to evaluate her symptoms related to hypoglycemia and make any further adjustments as necessary.   PHQ-9: Based on the patients    Office Visit from 10/29/2017 in [place]  PHQ-9 Total Score  7     score we have decided to monitor as patient has refused treatment.  Past Medical History:  Diagnosis Date  . CAD (coronary artery disease) 2006   s/p Quadrupal CABG 2006  . CHF (congestive heart failure) ([place]) 2010   EF 30-35% from Echo 06/2008  . CKD (chronic kidney disease) stage 4, GFR 15-29 ml/min (HCC)    fluctuating between stage 3 and 4 depending on GFR  . Diverticulosis   . DIVERTICULOSIS OF COLON 04/01/2007   Qualifier: Diagnosis of  By: Nelson-Smith CMA (AAMA), [name]    . DM (diabetes mellitus) ([place])    insulin  dependent  . History of colonic polyps   . HLD (hyperlipidemia)   . HTN (hypertension)   . Lumbar spinal stenosis   . MI (myocardial infarction) ([place]) 2003   . [place]  . Mouth ulcer adjacent to jaw bone 08/22/2010   S/p removal by ENT 09/2010   . MYOCARDIAL INFARCTION, HX OF 09/09/2006   Qualifier: Diagnosis of  By: Marinda Elk MD, Sonia Side    . Nephrolithiasis    hx of  . NEPHROLITHIASIS 04/01/2007   Qualifier: Diagnosis of  By: Harlon Ditty CMA (AAMA), [name]    . OA (osteoarthritis)   . Osteopenia 2010   T score of -1.8  . Psoriasis   . PVD (peripheral vascular disease) (HCC)    ABI indicated moderated reduction arterial flow on the left.  . Tricuspid regurgitation 2009   moderate to severe, EF 55%   Review of Systems:  ROS negative except as per HPI.  Physical Exam: Vitals:   10/29/17 1501  BP: 123/62  Pulse: 60  Temp: 97.8 F (36.6 C)  TempSrc: Oral  SpO2: 97%  Weight: 134 lb (60.8 kg)  Height: 5\' 2"  (1.575 m)   General: A/O x4, in no acute distress, afebrile, nondiaphoretic Cardio: RRR, no mrg's Pulmonary: CTA bilaterally  Assessment & Plan:   See Encounters Tab for problem based charting.  Patient discussed with Dr. Rebeca Alert

## 2017-10-29 NOTE — Patient Instructions (Addendum)
FOLLOW-UP INSTRUCTIONS When: 2 months For: Routine visit with Dr. Berline Lopes What to bring: all of your medications, the Victoza included  I have stopped your short acting meal time insulin that you take three times per day. Please continue the night time insulin at 30 units only. Please continue the Victoza, this is a 0.6mg  dose at 0.56ml of administration. Please call our clinic tomorrow to discuss the dosing and how your feel.  For your constipation, please increase the amount of water you drink daily to at least 64oz or 6-8 medium glasses of water. Please continue the stool softener. I would recommend buying Miralax and taking one dose daily until your bowel movements improve.   As always if your symptoms worsen, fail to improve, or you develop other concerning symptoms, please notify our office or visit the local ER if we are unavailable. Symptoms including confusion, weakness, passing out, severe sweating, or headache, should not be ignored and should encourage you to visit the ED.  Thank you for your visit to the [facility] today. If you have any questions or concerns please call us at [phone].

## 2017-10-29 NOTE — Assessment & Plan Note (Addendum)
Diabetes: [name] wore the CGM for 7 days. She stated that she started taking the Victoza on the 11th. She started feeling "terrible" two days prior which is better characterized by fatigue, lightheaded, generally weak, mildly confused, and constipated. I feel strongly that this is due to hypoglycemia given her CGM data with tremendously improved and almost flat daily readings all within about 70-140 over the past 5 days with multiple episodes of lows as well. I feel as if she has responded well to the Victoza and will need far less insulin. She denied fever, chills, diarrhea, hematochezia, join pain or myalgias.   The average reading was 165, % time in target was 58, % time below target was 5, and % time above target was. 37. Intervention will be to stop the patients short acting insulin, decrease the long acting insulin to 30U daily and continue the liraglutide. The patient will be scheduled to see her PCP for a routine visit in 2 months. She will be called tomorrow and the Monday after to evaluate her symptoms related to hypoglycemia and make any further adjustments as necessary.   Addendum: Called and spoke with patient. She was feeling much improved today after the medication changes. She also clarified that she was taking the 0.6mg  of Victoza daily but that her script advised her to increase this to 1.2mg  daily. I stated that I would prefer that she remain on the 0.6mg  daily until our next visit. I will attempt to obtain an appointment with her for approximately 2 months out.

## 2017-11-03 NOTE — Progress Notes (Signed)
Internal Medicine Clinic Attending  Case discussed with Dr. Harbrecht at the time of the visit.  We reviewed the resident's history and exam and pertinent patient test results.  I agree with the assessment, diagnosis, and plan of care documented in the resident's note.  Alexander Raines, M.D., Ph.D.  

## 2017-11-12 ENCOUNTER — Telehealth: Payer: Self-pay | Admitting: Internal Medicine

## 2017-12-08 ENCOUNTER — Other Ambulatory Visit: Payer: Self-pay

## 2017-12-08 ENCOUNTER — Ambulatory Visit (INDEPENDENT_AMBULATORY_CARE_PROVIDER_SITE_OTHER): Payer: PPO | Admitting: Internal Medicine

## 2017-12-08 VITALS — BP 105/57 | HR 65 | Temp 98.6°F | Ht 61.0 in | Wt 130.8 lb

## 2017-12-08 DIAGNOSIS — B001 Herpesviral vesicular dermatitis: Secondary | ICD-10-CM

## 2017-12-08 DIAGNOSIS — R059 Cough, unspecified: Secondary | ICD-10-CM

## 2017-12-08 DIAGNOSIS — R05 Cough: Secondary | ICD-10-CM

## 2017-12-08 MED ORDER — DEXTROMETHORPHAN HBR 15 MG/5ML PO SYRP: 10.0000 mL | ORAL_SOLUTION | Freq: Four times a day (QID) | ORAL | 0 refills | Status: DC | PRN

## 2017-12-08 NOTE — Patient Instructions (Signed)
Thank you for allowing [place] to provide your care today. Today we discussed your cough and fever blister. I am sorry that you are feeling bad.  We think that you have a viral bronchitis, this will take time for your symptoms to improve. We have prescribed you some cough syrup. Please make sure get plenty of rest and stay well hydrated.  For the fever blister, you can take the over the counter medication that you had before, this will just take time for this to heal. If you develop a new one or start having pain in the area we can presribe a medication to help decrease the time that it takes to heal.     Please follow-up with your regularly scheduled appointment.    Should you have any questions or concerns please call the internal medicine clinic at [phone].

## 2017-12-08 NOTE — Progress Notes (Signed)
CC: Productive cough and fever blister  HPI: [name] is a 77 y.o.  with a PMH listed below presenting for productive  cough for a few weeks. She also reports that she has a fever blister.   Please see A&P for status of the patient's chronic medical conditions  Past Medical History:  Diagnosis Date  . CAD (coronary artery disease) 2006   s/p Quadrupal CABG 2006  . CHF (congestive heart failure) ([place]) 2010   EF 30-35% from Echo 06/2008  . CKD (chronic kidney disease) stage 4, GFR 15-29 ml/min (HCC)    fluctuating between stage 3 and 4 depending on GFR  . Diverticulosis   . DIVERTICULOSIS OF COLON 04/01/2007   Qualifier: Diagnosis of  By: Nelson-Smith CMA (AAMA), [name]    . 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 History of colonic polyps   . HLD (hyperlipidemia)   . HTN (hypertension)   . Lumbar spinal stenosis   . MI (myocardial infarction) ([place]) 2003   . [place]  . Mouth ulcer adjacent to jaw bone 08/22/2010   S/p removal by ENT 09/2010   . MYOCARDIAL INFARCTION, HX OF 09/09/2006   Qualifier: Diagnosis of  By: Marinda Elk MD, Sonia Side    . Nephrolithiasis    hx of  . NEPHROLITHIASIS 04/01/2007   Qualifier: Diagnosis of  By: Harlon Ditty CMA (AAMA), [name]    . OA (osteoarthritis)   . Osteopenia 2010   T score of -1.8  . Psoriasis   . PVD (peripheral vascular disease) (HCC)    ABI indicated moderated reduction arterial flow on the left.  . Tricuspid regurgitation 2009   moderate to severe, EF 55%   Review of Systems: Refer to history of present illness and assessment and plans for pertinent review of systems, all others reviewed and negative.  Physical Exam:  Vitals:   12/08/17 1501  BP: (!) 105/57  Pulse: 65  Temp: 98.6 F (37 C)  TempSrc: Oral  SpO2: 100%  Weight: 130 lb 12.8 oz (59.3 kg)  Height: 5\' 1"  (1.549 m)    Physical Exam  HENT:  Head: Normocephalic and atraumatic.  Erythematous posterior pharynx, no exudates, no cervical  adenopathy.  Small vesicle on the lower lip  Neck: Normal range of motion. Neck supple.  Cardiovascular: Normal rate, regular rhythm and normal heart sounds.  Pulmonary/Chest: Effort normal and breath sounds normal. No respiratory distress.  Abdominal: Soft. Bowel sounds are normal. She exhibits no distension.  Lymphadenopathy:    She has no cervical adenopathy.  Skin: Skin is warm and dry.    Social History   Socioeconomic History  . Marital status: Widowed    Spouse name: Not on file  . Number of children: Not on file  . Years of education: Not on file  . Highest education level: Not on file  Occupational History  . Not on file  Social Needs  . Financial resource strain: Not on file  . Food insecurity:    Worry: Not on file    Inability: Not on file  . Transportation needs:    Medical: Not on file    Non-medical: Not on file  Tobacco Use  . Smoking status: Never Smoker  . Smokeless tobacco: Never Used  Substance and Sexual Activity  . Alcohol use: No    Alcohol/week: 0.0 standard drinks  . Drug use: No  . Sexual activity: Not on file  Lifestyle  . Physical activity:    Days  per week: Not on file    Minutes per session: Not on file  . Stress: Not on file  Relationships  . Social connections:    Talks on phone: Not on file    Gets together: Not on file    Attends religious service: Not on file    Active member of club or organization: Not on file    Attends meetings of clubs or organizations: Not on file    Relationship status: Not on file  . Intimate partner violence:    Fear of current or ex partner: Not on file    Emotionally abused: Not on file    Physically abused: Not on file    Forced sexual activity: Not on file  Other Topics Concern  . Not on file  Social History Narrative   Retired Event organiser   Widow/Widower   lives in [place]   4 kids (2 sons, 2 daughters) one son died many years ago in car accident.  other kids are all  in [place].   Has friend that helps take care of her   Family History  Problem Relation Age of Onset  . Cirrhosis Brother   . Heart disease Brother   . Heart attack Brother   . Hypertension Mother   . Hyperlipidemia Mother   . Hyperlipidemia Son   . Hypertension Son     Assessment & Plan:   See Encounters Tab for problem based charting.  Patient seen with Dr. Rebeca Alert

## 2017-12-09 DIAGNOSIS — B001 Herpesviral vesicular dermatitis: Secondary | ICD-10-CM | POA: Insufficient documentation

## 2017-12-09 NOTE — Assessment & Plan Note (Signed)
Patient also reports that she has been having a fever blister on her lower lip that started 2 days ago, she reports that it was painful when it started but that the pain has improved. She states that she often gets them when she has an upset stomach and in the winter. She normally will use a cream from the health food store, she does not remember what it is, but she ran out this. She has never taken any prescription medications for this. On exam she does have a small vesicle on the lower lip. Discussed treatment options such as valacyclovir with patient, informed her that this can decrease the time it will take to heal and that it's most effective early one. She reported that she did not want to try any medications at this time and that she wants to take the cream from the health food store.   Plan: -No further work up, can discuss valacyclovir if symptoms reoccur

## 2017-12-09 NOTE — Assessment & Plan Note (Signed)
She has been having a productive cough for about 2 weeks now with green sputum and she has been clearing her throat a lot. She reports that it has been improving a little bit. She has not tried anything for her cough. She denies any fevers, chills shortness of breath, muscle pains, congestion, runny nose, sore throat, or chest pain. She reports that she had something similar last year and was given steroids and antibiotics at that time. She reports that her friend has been sick recently. She has never had pneumonia in the past, does not have any pulmonary diseases and does not smoke. On exam she is relatively well appearing, pulmonary and cardiac exam was normal, she had some mild erythema in the posterior pharynx. Findings seem more consistent with a viral bronchitis, she does not have any concerning features for a pneumonia.   Plan: -Dextromethorphan cough syrup -Advised patient to rest and stay well hydrated

## 2017-12-09 NOTE — Progress Notes (Signed)
Internal Medicine Clinic Attending  I saw and evaluated the patient.  I personally confirmed the key portions of the history and exam documented by Dr. Sherry Ruffing and I reviewed pertinent patient test results.  The assessment, diagnosis, and plan were formulated together and I agree with the documentation in the resident's note.  Lenice Pressman, M.D., Ph.D.

## 2017-12-09 NOTE — Addendum Note (Signed)
Addended by: Oda Kilts on: 12/09/2017 02:19 PM   Modules accepted: Level of Service

## 2017-12-23 ENCOUNTER — Encounter: Payer: PPO | Admitting: Internal Medicine

## 2017-12-25 ENCOUNTER — Other Ambulatory Visit: Payer: Self-pay | Admitting: Internal Medicine

## 2017-12-25 DIAGNOSIS — K219 Gastro-esophageal reflux disease without esophagitis: Secondary | ICD-10-CM

## 2018-01-05 ENCOUNTER — Other Ambulatory Visit: Payer: Self-pay | Admitting: Internal Medicine

## 2018-01-05 DIAGNOSIS — I10 Essential (primary) hypertension: Secondary | ICD-10-CM

## 2018-01-17 ENCOUNTER — Other Ambulatory Visit: Payer: Self-pay | Admitting: Internal Medicine

## 2018-01-17 DIAGNOSIS — Z794 Long term (current) use of insulin: Principal | ICD-10-CM

## 2018-01-17 DIAGNOSIS — E1122 Type 2 diabetes mellitus with diabetic chronic kidney disease: Secondary | ICD-10-CM

## 2018-01-17 DIAGNOSIS — N183 Chronic kidney disease, stage 3 (moderate): Principal | ICD-10-CM

## 2018-01-19 ENCOUNTER — Other Ambulatory Visit: Payer: Self-pay | Admitting: Internal Medicine

## 2018-01-19 DIAGNOSIS — Z1231 Encounter for screening mammogram for malignant neoplasm of breast: Secondary | ICD-10-CM

## 2018-01-26 DIAGNOSIS — Z961 Presence of intraocular lens: Secondary | ICD-10-CM | POA: Diagnosis not present

## 2018-01-26 DIAGNOSIS — H04123 Dry eye syndrome of bilateral lacrimal glands: Secondary | ICD-10-CM | POA: Diagnosis not present

## 2018-01-26 DIAGNOSIS — E119 Type 2 diabetes mellitus without complications: Secondary | ICD-10-CM | POA: Diagnosis not present

## 2018-01-26 DIAGNOSIS — H02831 Dermatochalasis of right upper eyelid: Secondary | ICD-10-CM | POA: Diagnosis not present

## 2018-01-26 DIAGNOSIS — H02834 Dermatochalasis of left upper eyelid: Secondary | ICD-10-CM | POA: Diagnosis not present

## 2018-01-26 LAB — HM DIABETES EYE EXAM

## 2018-02-03 DIAGNOSIS — D485 Neoplasm of uncertain behavior of skin: Secondary | ICD-10-CM | POA: Diagnosis not present

## 2018-02-03 DIAGNOSIS — L570 Actinic keratosis: Secondary | ICD-10-CM | POA: Diagnosis not present

## 2018-02-03 DIAGNOSIS — Q828 Other specified congenital malformations of skin: Secondary | ICD-10-CM | POA: Diagnosis not present

## 2018-02-03 DIAGNOSIS — L853 Xerosis cutis: Secondary | ICD-10-CM | POA: Diagnosis not present

## 2018-02-03 DIAGNOSIS — Z79899 Other long term (current) drug therapy: Secondary | ICD-10-CM | POA: Diagnosis not present

## 2018-02-03 DIAGNOSIS — L720 Epidermal cyst: Secondary | ICD-10-CM | POA: Diagnosis not present

## 2018-02-03 DIAGNOSIS — L821 Other seborrheic keratosis: Secondary | ICD-10-CM | POA: Diagnosis not present

## 2018-02-03 DIAGNOSIS — L400 Psoriasis vulgaris: Secondary | ICD-10-CM | POA: Diagnosis not present

## 2018-02-05 DIAGNOSIS — L400 Psoriasis vulgaris: Secondary | ICD-10-CM | POA: Diagnosis not present

## 2018-02-16 ENCOUNTER — Other Ambulatory Visit: Payer: Self-pay

## 2018-02-16 DIAGNOSIS — Z794 Long term (current) use of insulin: Principal | ICD-10-CM

## 2018-02-16 DIAGNOSIS — E118 Type 2 diabetes mellitus with unspecified complications: Secondary | ICD-10-CM

## 2018-02-16 MED ORDER — LIRAGLUTIDE 18 MG/3ML ~~LOC~~ SOPN: 0.6000 mg | PEN_INJECTOR | Freq: Every day | SUBCUTANEOUS | 0 refills | Status: DC

## 2018-02-16 NOTE — Telephone Encounter (Signed)
Next appt scheduled 2/12 with PCP.

## 2018-02-16 NOTE — Telephone Encounter (Signed)
liraglutide (VICTOZA) 18 MG/3ML SOPN, REFILL REQUEST @  [facility] [place], [place] [phone] (Phone) [phone] (Fax)

## 2018-02-17 ENCOUNTER — Other Ambulatory Visit: Payer: Self-pay | Admitting: Internal Medicine

## 2018-02-17 DIAGNOSIS — Z794 Long term (current) use of insulin: Principal | ICD-10-CM

## 2018-02-17 DIAGNOSIS — E118 Type 2 diabetes mellitus with unspecified complications: Secondary | ICD-10-CM

## 2018-02-19 ENCOUNTER — Ambulatory Visit
Admission: RE | Admit: 2018-02-19 | Discharge: 2018-02-19 | Disposition: A | Discharge status: Home / Self Care | Payer: PPO | Source: Ambulatory Visit | Attending: Family Medicine | Admitting: Family Medicine

## 2018-02-19 DIAGNOSIS — Z1231 Encounter for screening mammogram for malignant neoplasm of breast: Secondary | ICD-10-CM

## 2018-02-22 ENCOUNTER — Other Ambulatory Visit: Payer: Self-pay | Admitting: Internal Medicine

## 2018-02-22 ENCOUNTER — Other Ambulatory Visit: Payer: Self-pay | Admitting: Family Medicine

## 2018-02-22 DIAGNOSIS — R928 Other abnormal and inconclusive findings on diagnostic imaging of breast: Secondary | ICD-10-CM

## 2018-02-23 NOTE — Progress Notes (Signed)
   CC: diabetes, HTN  HPI:[name] is a 78 y.o. female who presents for evaluation of diabetes, HTN, . Please see individual problem based A/P for details.  Past Medical History:  Diagnosis Date  . CAD (coronary artery disease) 2006   s/p Quadrupal CABG 2006  . CHF (congestive heart failure) ([place]) 2010   EF 30-35% from Echo 06/2008  . CKD (chronic kidney disease) stage 4, GFR 15-29 ml/min (HCC)    fluctuating between stage 3 and 4 depending on GFR  . Diverticulosis   . DIVERTICULOSIS OF COLON 04/01/2007   Qualifier: Diagnosis of  By: Nelson-Smith CMA (AAMA), [name]    . 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 History of colonic polyps   . HLD (hyperlipidemia)   . HTN (hypertension)   . Lumbar spinal stenosis   . MI (myocardial infarction) ([place]) 2003   . [place]  . Mouth ulcer adjacent to jaw bone 08/22/2010   S/p removal by ENT 09/2010   . MYOCARDIAL INFARCTION, HX OF 09/09/2006   Qualifier: Diagnosis of  By: Marinda Elk MD, Sonia Side    . Nephrolithiasis    hx of  . NEPHROLITHIASIS 04/01/2007   Qualifier: Diagnosis of  By: Harlon Ditty CMA (AAMA), [name]    . OA (osteoarthritis)   . Osteopenia 2010   T score of -1.8  . Psoriasis   . PVD (peripheral vascular disease) (HCC)    ABI indicated moderated reduction arterial flow on the left.  . Tricuspid regurgitation 2009   moderate to severe, EF 55%   Review of Systems:  ROS negative except as per HPI.  Physical Exam: Vitals:   02/24/18 1440  BP: (!) 124/52  Pulse: (!) 58  SpO2: 98%  Weight: 127 lb 4.8 oz (57.7 kg)   General: A/O x4, in no acute distress, afebrile, nondiaphoretic Cardio: RRR, GII systolic murmur  Pulmonary: CTA bilaterally MSK: BLE nontender, nonedematous Psych: Appropriate affect, not depressed in appearance, engages well  Assessment & Plan:   See Encounters Tab for problem based charting.  Patient discussed with Dr. Eppie Gibson

## 2018-02-24 ENCOUNTER — Encounter: Payer: Self-pay | Admitting: Internal Medicine

## 2018-02-24 ENCOUNTER — Ambulatory Visit
Admission: RE | Admit: 2018-02-24 | Discharge: 2018-02-24 | Disposition: A | Discharge status: Home / Self Care | Payer: Medicare Other | Source: Ambulatory Visit | Attending: Family Medicine | Admitting: Family Medicine

## 2018-02-24 ENCOUNTER — Ambulatory Visit (INDEPENDENT_AMBULATORY_CARE_PROVIDER_SITE_OTHER): Payer: Medicare Other | Admitting: Internal Medicine

## 2018-02-24 VITALS — BP 124/52 | HR 58 | Wt 127.3 lb

## 2018-02-24 DIAGNOSIS — E119 Type 2 diabetes mellitus without complications: Secondary | ICD-10-CM

## 2018-02-24 DIAGNOSIS — I130 Hypertensive heart and chronic kidney disease with heart failure and stage 1 through stage 4 chronic kidney disease, or unspecified chronic kidney disease: Secondary | ICD-10-CM

## 2018-02-24 DIAGNOSIS — E118 Type 2 diabetes mellitus with unspecified complications: Secondary | ICD-10-CM | POA: Diagnosis not present

## 2018-02-24 DIAGNOSIS — I5030 Unspecified diastolic (congestive) heart failure: Secondary | ICD-10-CM

## 2018-02-24 DIAGNOSIS — E1122 Type 2 diabetes mellitus with diabetic chronic kidney disease: Secondary | ICD-10-CM

## 2018-02-24 DIAGNOSIS — Z79899 Other long term (current) drug therapy: Secondary | ICD-10-CM

## 2018-02-24 DIAGNOSIS — I340 Nonrheumatic mitral (valve) insufficiency: Secondary | ICD-10-CM

## 2018-02-24 DIAGNOSIS — I10 Essential (primary) hypertension: Secondary | ICD-10-CM | POA: Diagnosis not present

## 2018-02-24 DIAGNOSIS — R011 Cardiac murmur, unspecified: Secondary | ICD-10-CM

## 2018-02-24 DIAGNOSIS — I5032 Chronic diastolic (congestive) heart failure: Secondary | ICD-10-CM

## 2018-02-24 DIAGNOSIS — R928 Other abnormal and inconclusive findings on diagnostic imaging of breast: Secondary | ICD-10-CM

## 2018-02-24 DIAGNOSIS — Z794 Long term (current) use of insulin: Secondary | ICD-10-CM | POA: Diagnosis not present

## 2018-02-24 DIAGNOSIS — N183 Chronic kidney disease, stage 3 (moderate): Secondary | ICD-10-CM | POA: Diagnosis not present

## 2018-02-24 DIAGNOSIS — Z1239 Encounter for other screening for malignant neoplasm of breast: Secondary | ICD-10-CM

## 2018-02-24 LAB — POCT GLYCOSYLATED HEMOGLOBIN (HGB A1C): HEMOGLOBIN A1C: 8.6 % — AB (ref 4.0–5.6)

## 2018-02-24 LAB — GLUCOSE, CAPILLARY: Glucose-Capillary: 262 mg/dL — ABNORMAL HIGH (ref 70–99)

## 2018-02-24 MED ORDER — GLUCOSE BLOOD VI STRP: 1.0000 | ORAL_STRIP | Freq: Three times a day (TID) | 4 refills | Status: DC

## 2018-02-24 MED ORDER — LIRAGLUTIDE 18 MG/3ML ~~LOC~~ SOPN: 1.2000 mg | PEN_INJECTOR | Freq: Every day | SUBCUTANEOUS | 5 refills | Status: DC

## 2018-02-24 MED ORDER — FREESTYLE LANCETS MISC: 11 refills | Status: DC

## 2018-02-24 MED ORDER — FUROSEMIDE 40 MG PO TABS: ORAL_TABLET | ORAL | 3 refills | Status: DC

## 2018-02-24 MED ORDER — OLMESARTAN MEDOXOMIL 5 MG PO TABS: 5.0000 mg | ORAL_TABLET | Freq: Every day | ORAL | 3 refills | Status: DC

## 2018-02-24 NOTE — Progress Notes (Signed)
Case discussed with Dr. Berline Lopes at the time of the visit. We reviewed the resident's history and exam and pertinent patient test results. I agree with the assessment, diagnosis, and plan of care documented in the resident's note

## 2018-02-24 NOTE — Patient Instructions (Addendum)
FOLLOW-UP INSTRUCTIONS When: 3-4 months For: Routine visit What to bring: All of your medications  I have increased your Victoza today to 1.2mg  (this may read as 0.13ml) please pay close attention to the dosing and call with any questions.   I will notify you of the lab results when available to me.   Thank you for your visit to the [name] today. If you have any questions or concerns please call us at [phone].

## 2018-02-24 NOTE — Assessment & Plan Note (Signed)
Breast cancer screening: Possible asymmetric right breast during routine screening mammogram. Reflex to diagnostic mammogram and [place] ordered. Diagnostic mammogram and ultrasound with findings of benign cystic structure and recommendation to repeat routine screening mammogram in one year.

## 2018-02-24 NOTE — Assessment & Plan Note (Signed)
Diabetes: Last A1c 7.9% but up to 8.6% today. She endorses three days absent Victoza and dietary indiscretion due to the stress of her son being in the ICU and now a rehab facility for such a prolonged period of time. She provide data from her glucometer with only morning readings averaging near 190. I feel that she would benefit from some afternoon values which I have asked her to obtain.   Plan:  Visited ophthalmology in January. Will increase Victoza to 1.2mg  daily, Demonstration of dose change by Debera Lat has occurred and the patient is able to teach back. Repeat A1c in 3-4 months BMP ordered for renal function

## 2018-02-24 NOTE — Assessment & Plan Note (Signed)
Hypertension: Patient's BP today is 124/52 with a goal of <140/80. The patient endorses adherence to their medications regimen. They denied, chest pain, headache, visual changes, lightheadedness, weakness, dizziness on standing, swelling in the feet or ankles.   Plan: Continue Amlodipine 10mg  daily Continue Atenolol 12.5mg  daily Continue Olmesartan 5mg  daily (Continue furosemide 40mg  daily) Affecting but not primary therapy

## 2018-02-24 NOTE — Assessment & Plan Note (Signed)
HFpEF: Last LV EF of 55-60% in 06/2017 with G1DD, mild/moderate mitral valve calcification and moderate regurgitation and mildly dilated left atrium. Denied worsening dyspnea on exertion, orthopnea or pedal edema.   Plan: Continue furosemide 40mg mg daily Continue atenolol 12.5mg  daily

## 2018-02-25 LAB — BMP8+ANION GAP
Anion Gap: 16 mmol/L (ref 10.0–18.0)
BUN / CREAT RATIO: 13 (ref 12–28)
BUN: 19 mg/dL (ref 8–27)
CO2: 27 mmol/L (ref 20–29)
CREATININE: 1.46 mg/dL — AB (ref 0.57–1.00)
Calcium: 9.7 mg/dL (ref 8.7–10.3)
Chloride: 97 mmol/L (ref 96–106)
GFR calc Af Amer: 39 mL/min/{1.73_m2} — ABNORMAL LOW (ref >59)
GFR calc non Af Amer: 34 mL/min/{1.73_m2} — ABNORMAL LOW (ref >59)
Glucose: 265 mg/dL — ABNORMAL HIGH (ref 65–99)
Potassium: 4.1 mmol/L (ref 3.5–5.2)
Sodium: 140 mmol/L (ref 134–144)

## 2018-03-01 ENCOUNTER — Encounter: Payer: Self-pay | Admitting: *Deleted

## 2018-03-01 ENCOUNTER — Telehealth: Payer: Self-pay

## 2018-03-01 DIAGNOSIS — E1122 Type 2 diabetes mellitus with diabetic chronic kidney disease: Secondary | ICD-10-CM

## 2018-03-01 DIAGNOSIS — N183 Chronic kidney disease, stage 3 unspecified: Secondary | ICD-10-CM

## 2018-03-01 DIAGNOSIS — Z794 Long term (current) use of insulin: Principal | ICD-10-CM

## 2018-03-01 MED ORDER — GLUCOSE BLOOD VI STRP: ORAL_STRIP | 3 refills | Status: DC

## 2018-03-01 MED ORDER — ACCU-CHEK FASTCLIX LANCETS MISC: 3 refills | Status: DC

## 2018-03-01 MED ORDER — ACCU-CHEK GUIDE ME W/DEVICE KIT: 1.0000 | PACK | Freq: Three times a day (TID) | 1 refills | Status: AC

## 2018-03-01 MED ORDER — ACCU-CHEK GUIDE ME W/DEVICE KIT: 1.0000 | PACK | Freq: Three times a day (TID) | Status: DC

## 2018-03-01 NOTE — Telephone Encounter (Signed)
Pt states insurance will not paid for a meter, please call pt back.

## 2018-03-01 NOTE — Telephone Encounter (Signed)
New meter prescriptions requested.

## 2018-03-01 NOTE — Telephone Encounter (Signed)
"  Insurance will not pay for meter strips that I got." She no longer has [employer] advantage, the pharmacy told her her new insurance only covers accu chek or one touch. She checks three times send rx to [facility] [address] 90 day supply.

## 2018-03-03 ENCOUNTER — Telehealth: Payer: Self-pay | Admitting: Internal Medicine

## 2018-03-03 NOTE — Telephone Encounter (Signed)
Notified patient of her lab results. She stated understanding. In addition she stated that he glucose levels have been ~160's with the exception of this morning. She denied confusion, chills, sweating, lightheadedness, dizziness or fatigue.   [name], MD [facility] Internal Medicine, PGY-2

## 2018-03-04 NOTE — Telephone Encounter (Signed)
Patient presented to our office with her new meter and supplies. She asked for help learning to use her new lancing device. Instructed her on how to use her lancing device and how to change her drum/needles. She repeated back demonstration to show understanding.

## 2018-04-16 ENCOUNTER — Other Ambulatory Visit: Payer: Self-pay | Admitting: Internal Medicine

## 2018-04-16 DIAGNOSIS — E785 Hyperlipidemia, unspecified: Secondary | ICD-10-CM

## 2018-04-16 DIAGNOSIS — Z951 Presence of aortocoronary bypass graft: Secondary | ICD-10-CM

## 2018-04-16 DIAGNOSIS — I10 Essential (primary) hypertension: Secondary | ICD-10-CM

## 2018-05-10 ENCOUNTER — Telehealth: Payer: Self-pay | Admitting: *Deleted

## 2018-05-10 NOTE — Telephone Encounter (Signed)
Patient called in. States she lives in a senior complex. Has learned that the woman who lives above her was d/c from [facility] on 05/07/2018 and learned 4/25 that she is positive for Covid-19. This woman's brother is patient's significant other. He was around his sister on 4/24 but was wearing a mask. He stayed with patient 4/24-4/26. Today he has been instructed by his PCP to check temp BID and self-quarantine for the next 7-10 days. Patient wants to know what she should do. Verified patient denies fever, chills, myalgias, SHOB, sore throat. Positive for AM cough x 15-20 years. Discussed with today's Attending Dareen Piano) and patient notified no precautions necessary at this time. Understands to call back if any of these or other symptoms occur. Discussed good hand hygiene and cough etiquette for her and anyone who enters her home. States her significant other will not be at her place for next 7-10 days as he's quarantining at his place. Patient very appreciative. Hubbard Hartshorn, RN, BSN

## 2018-05-11 NOTE — Telephone Encounter (Signed)
I agree. Thank you.

## 2018-05-13 ENCOUNTER — Other Ambulatory Visit: Payer: Self-pay | Admitting: Internal Medicine

## 2018-05-13 DIAGNOSIS — K219 Gastro-esophageal reflux disease without esophagitis: Secondary | ICD-10-CM

## 2018-05-20 ENCOUNTER — Other Ambulatory Visit: Payer: Self-pay | Admitting: Internal Medicine

## 2018-05-20 DIAGNOSIS — Z794 Long term (current) use of insulin: Secondary | ICD-10-CM

## 2018-05-20 DIAGNOSIS — E118 Type 2 diabetes mellitus with unspecified complications: Secondary | ICD-10-CM

## 2018-05-20 NOTE — Telephone Encounter (Signed)
Refill Request- Pt contacted her pharmacy already the medication listed below has not been filled yet and would ike a call back.  liraglutide (VICTOZA) 18 MG/3ML SOPN  [facility] DR[facility] STORE [place], [place]

## 2018-05-21 MED ORDER — LIRAGLUTIDE 18 MG/3ML ~~LOC~~ SOPN: 1.2000 mg | PEN_INJECTOR | Freq: Every day | SUBCUTANEOUS | 3 refills | Status: DC

## 2018-06-04 ENCOUNTER — Other Ambulatory Visit: Payer: Self-pay

## 2018-06-04 NOTE — Telephone Encounter (Signed)
Insulin Pen Needle (BD PEN NEEDLE NANO U/F) 32G X 4 MM MISC, REFILL REQUEST @  [facility] [place], [address] AT [place] [phone] (Phone) [phone] (Fax)

## 2018-06-05 MED ORDER — INSULIN PEN NEEDLE 32G X 4 MM MISC: 2 refills | Status: DC

## 2018-06-11 ENCOUNTER — Telehealth: Payer: Self-pay

## 2018-06-11 NOTE — Telephone Encounter (Signed)
06/11/2018 Name: [name]  MRN: [id number] DOB: January 13, 1941    Patient Initials: DS AGE: 78 y.o. GENDER: female  ETHNICITY: White  Patient is on antihyperglycemic medication(s) that carry hypoglycemic risk. Patient qualifies for quality improvement initiative of assessing baseline knowledge of hypoglycemia and filling education gaps, along with medication review and optimization.  Verbal consent to conduct survey received: [x] Yes  [] No  LABS: Document most recent values (within previous 12 months), but should be at least 3-6 months apart) Height (in):61        Weight (Kg):57.7            BMI:24.71  Date/eGFR:02/24/18 34   Date/UACR: 04/01/17  [] <30  [x] 30-300 []  >300 [] Not available Date/CrCL:02/24/18 28 Date/eGFR:06/10/17 37   Date/HbA1c: 02/24/18 8.6 Date/HbA1c:10/14/17 7.9  The ASCVD Risk score (Goff DC Jr., et al., 2013) failed to calculate for the following reasons:   The 2013 ASCVD risk score is only valid for ages 33 to 46   The patient has a prior MI or stroke diagnosis 10-y ASCVD risk: patient >79   Allergies: Allergies  Allergen Reactions  . Valsartan Cough    Medications:  Outpatient and Clinic-Administered Medications   ACCU-CHEK FASTCLIX LANCETS MISC  3 ordered       amLODipine (NORVASC) 10 MG tablet 10 mg, Daily 3 ordered       aspirin EC 81 MG tablet 81 mg, Daily        atenolol (TENORMIN) 25 MG tablet  1 ordered       benzonatate (TESSALON) 100 MG capsule 100-200 mg, 3 times daily PRN 0 ordered       Patient not taking. Reported on 10/22/2017     Blood Glucose Monitoring Suppl (ACCU-CHEK GUIDE ME) w/Device KIT 1 each, 3 times daily 1 ordered       Calcium Carb-Cholecalciferol (CALCIUM 600 + D PO) 1 tablet, 2 times daily        clopidogrel (PLAVIX) 75 MG tablet  3 ordered       dextromethorphan 15 MG/5ML syrup 10 mL, 4 times daily PRN 0 ordered       diclofenac sodium (VOLTAREN) 1 % GEL 2 g, 4 times daily 3 ordered       Patient not taking.  Reported on 10/22/2017     famotidine (PEPCID) 20 MG tablet  3 ordered       furosemide (LASIX) 40 MG tablet  3 ordered       glucose blood (ACCU-CHEK GUIDE) test strip  3 ordered       HUMIRA PEN 40 MG/0.8ML PNKT         Insulin Glargine (LANTUS SOLOSTAR) 100 UNIT/ML Solostar Pen 30 Units, Daily at bedtime 5 ordered       Insulin Pen Needle (BD PEN NEEDLE NANO U/F) 32G X 4 MM MISC  2 ordered            Patient not taking. Reported on 06/11/2018    liraglutide (VICTOZA) 18 MG/3ML SOPN 1.2 mg, Daily 3 ordered       nitroGLYCERIN (NITROSTAT) 0.3 MG SL tablet 0.3 mg, Every 5 min PRN 12 ordered       olmesartan (BENICAR) 5 MG tablet 5 mg, Daily 3 ordered       potassium chloride (K-DUR) 10 MEQ tablet 10 mEq, 2 times daily 3 ordered       rosuvastatin (CRESTOR) 20 MG tablet  1 ordered  Patient interview:  What is your smoking status? [x] Never [] Past  [] Current (w/in past 2 mo)  What is your current living situation? [x] Home [] Homeless  [] Shelter [] Other:   What type of diabetes do you have? 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 2006 years  Have you ever heard the term "hypoglycemia"? [] Yes [x] No  [] Not Sure   Have you ever had hypoglycemia where you felt hungry, shaky, sweaty, or other symptoms?[x] Yes [] No  [] Not Sure   Have you ever received education about hypoglycemia?[] Yes [x] No  [] Not Sure   In the past month, have you had a hypoglycemic episode?  [] Yes [x] No  [] Not Sure   What symptoms did you feel? Sick to stomach and starts shaking; normally happens when outside the home and does not have glucometer available    How did you treat the hypoglycemic episode(s)? Orange juice and candy  Do you have a glucose source with you in case you have hypoglycemia when you go out? [x] Yes [] No  [] Not Sure  Source: hard candy  What is your meal schedule? [] Once-daily [] Twice-daily  [x] Three times daily  [] ? four times daily []  Erratic What is your snack schedule? [] None [] Once-daily [x] Twice-daily  [] Three times daily  [] ? four times daily [] Erratic How many times have you EVER, in your entire life, gone to any ED or any hospital because of a hypoglycemia? [x] None [] 1-2 [] ? 3 In the past 12 months, how many times have you gone to any ED for ANY reason?  (ex. infection, car accident, back pain) [x] None [] 1 [] ? 2   Intervention: Patient [x] can [] cannot articulate what hypoglycemia is.  Patient extensively counseled on the signs and symptoms of hypoglycemia. Patient counseled on the rule of 15 carbs and to recheck blood glucose in 15 minutes to make sure hypoglycemia resolving. Patient encouraged to track future hypoglycemia events and discuss with practitioner or seek care as needed.    Patient is not having unacceptable episodes of hypoglycemia.  Follow-up: None needed at this time. Consider increasing Victoza to 1.69m to help bring HA1C to goal.   AIsaias Sakai PSherian ReinD PGY1 Pharmacy Resident  Phone [phone]/29/2020      11:40 AM

## 2018-07-15 ENCOUNTER — Other Ambulatory Visit: Payer: Self-pay | Admitting: Internal Medicine

## 2018-07-15 DIAGNOSIS — K219 Gastro-esophageal reflux disease without esophagitis: Secondary | ICD-10-CM

## 2018-08-04 ENCOUNTER — Other Ambulatory Visit: Payer: Self-pay

## 2018-08-04 ENCOUNTER — Encounter: Payer: Self-pay | Admitting: Internal Medicine

## 2018-08-04 ENCOUNTER — Encounter: Payer: Self-pay | Admitting: *Deleted

## 2018-08-04 ENCOUNTER — Ambulatory Visit (INDEPENDENT_AMBULATORY_CARE_PROVIDER_SITE_OTHER): Payer: Medicare Other | Admitting: Internal Medicine

## 2018-08-04 VITALS — BP 135/49 | HR 53 | Temp 98.6°F

## 2018-08-04 DIAGNOSIS — Z720 Tobacco use: Secondary | ICD-10-CM

## 2018-08-04 DIAGNOSIS — I129 Hypertensive chronic kidney disease with stage 1 through stage 4 chronic kidney disease, or unspecified chronic kidney disease: Secondary | ICD-10-CM

## 2018-08-04 DIAGNOSIS — Z794 Long term (current) use of insulin: Secondary | ICD-10-CM

## 2018-08-04 DIAGNOSIS — N183 Chronic kidney disease, stage 3 unspecified: Secondary | ICD-10-CM

## 2018-08-04 DIAGNOSIS — E785 Hyperlipidemia, unspecified: Secondary | ICD-10-CM | POA: Diagnosis not present

## 2018-08-04 DIAGNOSIS — W19XXXA Unspecified fall, initial encounter: Secondary | ICD-10-CM | POA: Insufficient documentation

## 2018-08-04 DIAGNOSIS — E1122 Type 2 diabetes mellitus with diabetic chronic kidney disease: Secondary | ICD-10-CM | POA: Diagnosis not present

## 2018-08-04 DIAGNOSIS — I10 Essential (primary) hypertension: Secondary | ICD-10-CM | POA: Diagnosis not present

## 2018-08-04 DIAGNOSIS — Z9181 History of falling: Secondary | ICD-10-CM

## 2018-08-04 DIAGNOSIS — Z79899 Other long term (current) drug therapy: Secondary | ICD-10-CM

## 2018-08-04 DIAGNOSIS — E782 Mixed hyperlipidemia: Secondary | ICD-10-CM

## 2018-08-04 DIAGNOSIS — Z1211 Encounter for screening for malignant neoplasm of colon: Secondary | ICD-10-CM

## 2018-08-04 LAB — POCT GLYCOSYLATED HEMOGLOBIN (HGB A1C): Hemoglobin A1C: 7.8 % — AB (ref 4.0–5.6)

## 2018-08-04 LAB — GLUCOSE, CAPILLARY: Glucose-Capillary: 301 mg/dL — ABNORMAL HIGH (ref 70–99)

## 2018-08-04 NOTE — Assessment & Plan Note (Signed)
CKDIII: Most recent sCr increased slightly to 1.46 from 1.36 prior with a minor change in GFR to 39 from 43. We will repeat this today and focus our efforts on controlling her BP and DMII.   Plan: BMP today Urine protein ordered

## 2018-08-04 NOTE — Patient Instructions (Signed)
FOLLOW-UP INSTRUCTIONS When: 3-4 months For: Routine visit What to bring: All of your medications  I have stopped your atenolol.   Today we discussed your diabetes, high blood pressure and weight loss. I will notify you of the results of any labs from today's evaluation when available to me.   Thank you for your visit to the [facility] today. If you have any questions or concerns please call us at [phone].

## 2018-08-04 NOTE — Assessment & Plan Note (Signed)
Colon cancer screening w/ polyps in 2013: Given the recommendation to discontinue colon cancer screening after the age of 6 and as the patient is not amenable in the setting of increased risk of complications from such I feel it best to forgo screening. There is notable concern for weight loss but I do think this can be attributed to her decreased reliance and dose of insulin and the administration of the GLP-1.

## 2018-08-04 NOTE — Progress Notes (Addendum)
   CC: DMII, HTN, and Chronic kidney disease  HPI:[name] is a 78 y.o. female who presents for evaluation of her long standing DMII, HTN and CKDIII. Please see individual problem based A/P for details.  Past Medical History:  Diagnosis Date  . CAD (coronary artery disease) 2006   s/p Quadrupal CABG 2006  . CHF (congestive heart failure) ([place]) 2010   EF 30-35% from Echo 06/2008  . CKD (chronic kidney disease) stage 4, GFR 15-29 ml/min (HCC)    fluctuating between stage 3 and 4 depending on GFR  . CKD (chronic kidney disease), stage III ([place]) 09/27/2013  . Diverticulosis   . DIVERTICULOSIS OF COLON 04/01/2007   Qualifier: Diagnosis of  By: Nelson-Smith CMA (AAMA), [name]    . 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 History of colonic polyps   . HLD (hyperlipidemia)   . HTN (hypertension)   . Lumbar spinal stenosis   . MI (myocardial infarction) ([place]) 2003   . [place]   . Mouth ulcer adjacent to jaw bone 08/22/2010   S/p removal by ENT 09/2010   . MYOCARDIAL INFARCTION, HX OF 09/09/2006   Qualifier: Diagnosis of  By: Marinda Elk MD, Sonia Side    . Nephrolithiasis    hx of  . NEPHROLITHIASIS 04/01/2007   Qualifier: Diagnosis of  By: Harlon Ditty CMA (AAMA), [name]    . OA (osteoarthritis)   . Osteopenia 2010   T score of -1.8  . Psoriasis   . PVD (peripheral vascular disease) (HCC)    ABI indicated moderated reduction arterial flow on the left.  . Tricuspid regurgitation 2009   moderate to severe, EF 55%   Review of Systems:  ROS negative except as per HPI.  Social Hx:  Patient continues to use tobacco products on occassion.   Physical Exam: Vitals:   08/04/18 0956  BP: (!) 135/49  Pulse: (!) 53  Temp: 98.6 F (37 C)  TempSrc: Oral  SpO2: 98%   General: A/O x4, in no acute distress, afebrile, nondiaphoretic HEENT: PEERL, EMO intact Cardio: RRR, no mrg's  Pulmonary: CTA bilaterally, no wheezing or crackles  Abdomen: Bowel sounds normal,  soft, nontender  MSK: BLE nontender, nonedematous  Neuro: Alert, normal gait Psych: Appropriate affect, not depressed in appearance, engages well  Assessment & Plan:   See Encounters Tab for problem based charting.  Patient discussed with Dr. Angelia Mould

## 2018-08-04 NOTE — Assessment & Plan Note (Signed)
  Hypertension: Patient's BP today is 135/49 with a goal of <140/80 but a HR of 56. The patient endorses adherence to good medication regimen. She denied, chest pain, headache, visual changes, weakness, swelling in the feet or ankles. Given her dizziness on standing at times, recent fall, bradycardia age absent other indication for a beta blocker we have agreed to discontinue this.   Plan: Stop atenolol 12.5mg  daily Continue Olmesartan 5mg  daily Continue Amlodipine 10mg  daily BMP today

## 2018-08-04 NOTE — Assessment & Plan Note (Addendum)
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 Current A1c 7.8%. The patient denied polyuria, polydipsia, headache, fatigue, confusion, nausea, vomiting or diaphoresis. Given her relative stability, weight loss and borderline renal function I feel that we should continue the current treatment course. We will resume the topic of initiating an SGLT-2 instead of her insulin at her next visit if her weight  remains stable and there is >300mg /g proteinuria.   Plan: Continue Victoza 1.2mg  daily Continue Insulin glargine 30U QHS Repeat A1c at next visit BMP ordered Urine Cr/Alb ratio  Addendum: sCr stable, unchanged Decreased proteinuria, continue current therapy

## 2018-08-04 NOTE — Assessment & Plan Note (Signed)
Fall w/ Left hip injury: Mechanical as described. To preceding events or aura. No head injury or LOC noted. Patient stated that while in the shower she turned around and became tangled in the curtain and slipped a little on the wet surface. Given her occasional dizziness and prior episode of weakness that we had attributed to hypoglycemia we will attempt to deescalate what medical therapy we can by beginning with the atenolol today. Exam was unremarkable.

## 2018-08-05 DIAGNOSIS — E785 Hyperlipidemia, unspecified: Secondary | ICD-10-CM | POA: Insufficient documentation

## 2018-08-05 LAB — MICROALBUMIN / CREATININE URINE RATIO
Creatinine, Urine: 84.9 mg/dL
Microalb/Creat Ratio: 69 mg/g creat — ABNORMAL HIGH (ref 0–29)
Microalbumin, Urine: 58.5 ug/mL

## 2018-08-05 LAB — LIPID PANEL
Chol/HDL Ratio: 3.7 ratio (ref 0.0–4.4)
Cholesterol, Total: 162 mg/dL (ref 100–199)
HDL: 44 mg/dL (ref >39)
LDL Calculated: 70 mg/dL (ref 0–99)
Triglycerides: 238 mg/dL — ABNORMAL HIGH (ref 0–149)
VLDL Cholesterol Cal: 48 mg/dL — ABNORMAL HIGH (ref 5–40)

## 2018-08-05 LAB — BMP8+ANION GAP
Anion Gap: 18 mmol/L (ref 10.0–18.0)
BUN/Creatinine Ratio: 15 (ref 12–28)
BUN: 22 mg/dL (ref 8–27)
CO2: 23 mmol/L (ref 20–29)
Calcium: 8.9 mg/dL (ref 8.7–10.3)
Chloride: 96 mmol/L (ref 96–106)
Creatinine, Ser: 1.46 mg/dL — ABNORMAL HIGH (ref 0.57–1.00)
GFR calc Af Amer: 39 mL/min/{1.73_m2} — ABNORMAL LOW (ref >59)
GFR calc non Af Amer: 34 mL/min/{1.73_m2} — ABNORMAL LOW (ref >59)
Glucose: 303 mg/dL — ABNORMAL HIGH (ref 65–99)
Potassium: 4.6 mmol/L (ref 3.5–5.2)
Sodium: 137 mmol/L (ref 134–144)

## 2018-08-05 NOTE — Assessment & Plan Note (Addendum)
Elevated triglycerides-  Lipid Panel     Component Value Date/Time   CHOL 162 08/04/2018 1004   TRIG 238 (H) 08/04/2018 1004   HDL 44 08/04/2018 1004   CHOLHDL 3.7 08/04/2018 1004   CHOLHDL 4.2 09/27/2013 1137   VLDL 59 (H) 09/27/2013 1137   LDLCALC 70 08/04/2018 1004   Will discuss lifestyle changes vs medical therapy at next visit.

## 2018-08-06 NOTE — Progress Notes (Signed)
Internal Medicine Clinic Attending  Case discussed with Dr. Harbrecht at the time of the visit.  We reviewed the resident's history and exam and pertinent patient test results.  I agree with the assessment, diagnosis, and plan of care documented in the resident's note.   

## 2018-08-27 ENCOUNTER — Other Ambulatory Visit: Payer: Self-pay | Admitting: Internal Medicine

## 2018-08-27 NOTE — Telephone Encounter (Signed)
Order# [id number] [place]

## 2018-08-27 NOTE — Telephone Encounter (Signed)
Pharmacy calls for refills- mail order. Informed them scripts were sent to local pharmacy

## 2018-10-10 ENCOUNTER — Other Ambulatory Visit: Payer: Self-pay | Admitting: Internal Medicine

## 2018-10-10 DIAGNOSIS — E785 Hyperlipidemia, unspecified: Secondary | ICD-10-CM

## 2018-10-10 DIAGNOSIS — I10 Essential (primary) hypertension: Secondary | ICD-10-CM

## 2018-10-10 DIAGNOSIS — I739 Peripheral vascular disease, unspecified: Secondary | ICD-10-CM

## 2018-10-10 DIAGNOSIS — Z951 Presence of aortocoronary bypass graft: Secondary | ICD-10-CM

## 2018-10-11 ENCOUNTER — Other Ambulatory Visit: Payer: Self-pay | Admitting: Internal Medicine

## 2018-10-11 DIAGNOSIS — I10 Essential (primary) hypertension: Secondary | ICD-10-CM

## 2018-10-11 NOTE — Telephone Encounter (Signed)
Requesting all meds to be filled @  [place], [place] [employer] [phone] (Phone) [phone] (Fax)

## 2018-10-12 NOTE — Addendum Note (Signed)
Addended by: Velora Heckler on: 10/12/2018 10:39 AM   Modules accepted: Orders

## 2018-10-13 ENCOUNTER — Encounter (INDEPENDENT_AMBULATORY_CARE_PROVIDER_SITE_OTHER): Payer: Self-pay

## 2018-10-13 ENCOUNTER — Other Ambulatory Visit: Payer: Self-pay

## 2018-10-13 ENCOUNTER — Telehealth: Payer: Self-pay | Admitting: *Deleted

## 2018-10-13 ENCOUNTER — Inpatient Hospital Stay (HOSPITAL_COMMUNITY)
Admission: EM | Admit: 2018-10-13 | Discharge: 2018-10-15 | DRG: 372 | Disposition: A | Discharge status: Home / Self Care | Payer: Medicare Other | Attending: Internal Medicine | Admitting: Internal Medicine

## 2018-10-13 ENCOUNTER — Emergency Department (HOSPITAL_COMMUNITY): Payer: Medicare Other

## 2018-10-13 ENCOUNTER — Ambulatory Visit (INDEPENDENT_AMBULATORY_CARE_PROVIDER_SITE_OTHER): Payer: Medicare Other | Admitting: Internal Medicine

## 2018-10-13 ENCOUNTER — Observation Stay: Admission: AD | Admit: 2018-10-13 | Payer: Medicare Other | Source: Ambulatory Visit | Admitting: Internal Medicine

## 2018-10-13 ENCOUNTER — Encounter: Payer: Self-pay | Admitting: Internal Medicine

## 2018-10-13 ENCOUNTER — Encounter (HOSPITAL_COMMUNITY): Payer: Self-pay | Admitting: Radiology

## 2018-10-13 VITALS — BP 141/62 | HR 88 | Temp 98.8°F | Ht 61.0 in | Wt 121.2 lb

## 2018-10-13 DIAGNOSIS — R197 Diarrhea, unspecified: Secondary | ICD-10-CM | POA: Insufficient documentation

## 2018-10-13 DIAGNOSIS — L409 Psoriasis, unspecified: Secondary | ICD-10-CM | POA: Diagnosis present

## 2018-10-13 DIAGNOSIS — E785 Hyperlipidemia, unspecified: Secondary | ICD-10-CM | POA: Diagnosis present

## 2018-10-13 DIAGNOSIS — Z20828 Contact with and (suspected) exposure to other viral communicable diseases: Secondary | ICD-10-CM | POA: Diagnosis present

## 2018-10-13 DIAGNOSIS — Z888 Allergy status to other drugs, medicaments and biological substances status: Secondary | ICD-10-CM

## 2018-10-13 DIAGNOSIS — M1990 Unspecified osteoarthritis, unspecified site: Secondary | ICD-10-CM | POA: Diagnosis present

## 2018-10-13 DIAGNOSIS — Z8249 Family history of ischemic heart disease and other diseases of the circulatory system: Secondary | ICD-10-CM

## 2018-10-13 DIAGNOSIS — Z87442 Personal history of urinary calculi: Secondary | ICD-10-CM

## 2018-10-13 DIAGNOSIS — K921 Melena: Secondary | ICD-10-CM | POA: Diagnosis not present

## 2018-10-13 DIAGNOSIS — E1151 Type 2 diabetes mellitus with diabetic peripheral angiopathy without gangrene: Secondary | ICD-10-CM | POA: Diagnosis present

## 2018-10-13 DIAGNOSIS — I252 Old myocardial infarction: Secondary | ICD-10-CM

## 2018-10-13 DIAGNOSIS — E1122 Type 2 diabetes mellitus with diabetic chronic kidney disease: Secondary | ICD-10-CM | POA: Diagnosis present

## 2018-10-13 DIAGNOSIS — I509 Heart failure, unspecified: Secondary | ICD-10-CM | POA: Diagnosis present

## 2018-10-13 DIAGNOSIS — A040 Enteropathogenic Escherichia coli infection: Principal | ICD-10-CM | POA: Diagnosis present

## 2018-10-13 DIAGNOSIS — I2510 Atherosclerotic heart disease of native coronary artery without angina pectoris: Secondary | ICD-10-CM | POA: Diagnosis present

## 2018-10-13 DIAGNOSIS — N183 Chronic kidney disease, stage 3 unspecified: Secondary | ICD-10-CM | POA: Diagnosis present

## 2018-10-13 DIAGNOSIS — K5730 Diverticulosis of large intestine without perforation or abscess without bleeding: Secondary | ICD-10-CM | POA: Diagnosis present

## 2018-10-13 DIAGNOSIS — K862 Cyst of pancreas: Secondary | ICD-10-CM | POA: Diagnosis present

## 2018-10-13 DIAGNOSIS — I071 Rheumatic tricuspid insufficiency: Secondary | ICD-10-CM | POA: Diagnosis present

## 2018-10-13 DIAGNOSIS — M8580 Other specified disorders of bone density and structure, unspecified site: Secondary | ICD-10-CM | POA: Diagnosis present

## 2018-10-13 DIAGNOSIS — I130 Hypertensive heart and chronic kidney disease with heart failure and stage 1 through stage 4 chronic kidney disease, or unspecified chronic kidney disease: Secondary | ICD-10-CM | POA: Diagnosis present

## 2018-10-13 DIAGNOSIS — Z8349 Family history of other endocrine, nutritional and metabolic diseases: Secondary | ICD-10-CM

## 2018-10-13 DIAGNOSIS — Z951 Presence of aortocoronary bypass graft: Secondary | ICD-10-CM

## 2018-10-13 DIAGNOSIS — K529 Noninfective gastroenteritis and colitis, unspecified: Secondary | ICD-10-CM

## 2018-10-13 DIAGNOSIS — Z794 Long term (current) use of insulin: Secondary | ICD-10-CM

## 2018-10-13 HISTORY — DX: Diarrhea, unspecified: R19.7

## 2018-10-13 LAB — COMPREHENSIVE METABOLIC PANEL
ALT: 18 U/L (ref 0–44)
AST: 20 U/L (ref 15–41)
Albumin: 4.2 g/dL (ref 3.5–5.0)
Alkaline Phosphatase: 62 U/L (ref 38–126)
Anion gap: 12 (ref 5–15)
BUN: 19 mg/dL (ref 8–23)
CO2: 28 mmol/L (ref 22–32)
Calcium: 9.6 mg/dL (ref 8.9–10.3)
Chloride: 98 mmol/L (ref 98–111)
Creatinine, Ser: 1.37 mg/dL — ABNORMAL HIGH (ref 0.44–1.00)
GFR calc Af Amer: 42 mL/min — ABNORMAL LOW (ref >60)
GFR calc non Af Amer: 36 mL/min — ABNORMAL LOW (ref >60)
Glucose, Bld: 237 mg/dL — ABNORMAL HIGH (ref 70–99)
Potassium: 3.7 mmol/L (ref 3.5–5.1)
Sodium: 138 mmol/L (ref 135–145)
Total Bilirubin: 1 mg/dL (ref 0.3–1.2)
Total Protein: 7.1 g/dL (ref 6.5–8.1)

## 2018-10-13 LAB — TYPE AND SCREEN
ABO/RH(D): AB POS
Antibody Screen: NEGATIVE

## 2018-10-13 LAB — CBC
HCT: 43 % (ref 36.0–46.0)
Hemoglobin: 15 g/dL (ref 12.0–15.0)
MCH: 31.2 pg (ref 26.0–34.0)
MCHC: 34.9 g/dL (ref 30.0–36.0)
MCV: 89.4 fL (ref 80.0–100.0)
Platelets: 174 10*3/uL (ref 150–400)
RBC: 4.81 MIL/uL (ref 3.87–5.11)
RDW: 12.2 % (ref 11.5–15.5)
WBC: 13.1 10*3/uL — ABNORMAL HIGH (ref 4.0–10.5)
nRBC: 0 % (ref 0.0–0.2)

## 2018-10-13 LAB — LIPASE, BLOOD: Lipase: 17 U/L (ref 11–51)

## 2018-10-13 LAB — ABO/RH: ABO/RH(D): AB POS

## 2018-10-13 LAB — LACTIC ACID, PLASMA: Lactic Acid, Venous: 1.9 mmol/L (ref 0.5–1.9)

## 2018-10-13 LAB — POC OCCULT BLOOD, ED: Fecal Occult Bld: POSITIVE — AB

## 2018-10-13 MED ORDER — CIPROFLOXACIN HCL 500 MG PO TABS
500.0000 mg | ORAL_TABLET | Freq: Once | ORAL | Status: AC
  Administered 2018-10-13: 23:00:00 500 mg via ORAL
  Filled 2018-10-13: qty 1

## 2018-10-13 MED ORDER — IOHEXOL 300 MG/ML  SOLN
100.0000 mL | Freq: Once | INTRAMUSCULAR | Status: AC | PRN
  Administered 2018-10-13: 100 mL via INTRAVENOUS

## 2018-10-13 MED ORDER — MORPHINE SULFATE (PF) 4 MG/ML IV SOLN
4.0000 mg | Freq: Once | INTRAVENOUS | Status: AC
  Administered 2018-10-13: 4 mg via INTRAVENOUS
  Filled 2018-10-13: qty 1

## 2018-10-13 MED ORDER — SODIUM CHLORIDE 0.9 % IV BOLUS: 500.0000 mL | Freq: Once | INTRAVENOUS | Status: DC

## 2018-10-13 MED ORDER — PANTOPRAZOLE SODIUM 40 MG IV SOLR
40.0000 mg | Freq: Once | INTRAVENOUS | Status: AC
  Administered 2018-10-13: 23:00:00 40 mg via INTRAVENOUS
  Filled 2018-10-13: qty 40

## 2018-10-13 MED ORDER — ONDANSETRON HCL 4 MG/2ML IJ SOLN
4.0000 mg | Freq: Once | INTRAMUSCULAR | Status: AC
  Administered 2018-10-13: 4 mg via INTRAVENOUS
  Filled 2018-10-13: qty 2

## 2018-10-13 NOTE — Progress Notes (Signed)
CC: bloody diarrhea  HPI:  [name] is a 78 y.o. female with PMH below.  Today we will address  bloody diarrhea  Please see A&P for status of the patient's chronic medical conditions  Past Medical History:  Diagnosis Date  . CAD (coronary artery disease) 2006   s/p Quadrupal CABG 2006  . CHF (congestive heart failure) ([place]) 2010   EF 30-35% from Echo 06/2008  . CKD (chronic kidney disease) stage 4, GFR 15-29 ml/min (HCC)    fluctuating between stage 3 and 4 depending on GFR  . CKD (chronic kidney disease), stage III ([place]) 09/27/2013  . De Quervain's tenosynovitis, left 11/13/2015  . Diverticulosis   . DIVERTICULOSIS OF COLON 04/01/2007   Qualifier: Diagnosis of  By: Nelson-Smith CMA (AAMA), [name]    . DM (diabetes mellitus) ([place])    insulin dependent  . Dyslipidemia 10/15/2005   Qualifier: Diagnosis of  By: Derrel Nip MD, Helene Kelp    . History of colonic polyps   . HLD (hyperlipidemia)   . HTN (hypertension)   . Lumbar spinal stenosis   . MI (myocardial infarction) ([place]) 2003   . [place]  . Mouth ulcer adjacent to jaw bone 08/22/2010   S/p removal by ENT 09/2010   . MYOCARDIAL INFARCTION, HX OF 09/09/2006   Qualifier: Diagnosis of  By: Marinda Elk MD, Sonia Side    . Nephrolithiasis    hx of  . NEPHROLITHIASIS 04/01/2007   Qualifier: Diagnosis of  By: Harlon Ditty CMA (AAMA), [name]    . OA (osteoarthritis)   . Osteopenia 2010   T score of -1.8  . Psoriasis   . PVD (peripheral vascular disease) (HCC)    ABI indicated moderated reduction arterial flow on the left.  . Tricuspid regurgitation 2009   moderate to severe, EF 55%   Review of Systems:  ROS: Pulmonary: pt denies increased work of breathing, shortness of breath,  Cardiac: pt denies palpitations, chest pain,  Abdominal: pt endorse, nausea and diarrhea but no vomiting   Physical Exam:  Vitals:   10/13/18 1539 10/13/18 1541  BP:  (!) 141/62  Pulse:  88  Temp:  98.8 F (37.1 C)  TempSrc:  Oral   SpO2:  96%  Weight: 121 lb 3.2 oz (55 kg)   Height: 5\' 1"  (1.549 m)    Pulmonary: no increased wob, normal rr Abdominal: Suprapubic > RLQ tenderness to palpation, no rebound or guarding Psych: Alert   Social History   Socioeconomic History  . Marital status: Widowed    Spouse name: Not on file  . Number of children: Not on file  . Years of education: Not on file  . Highest education level: Not on file  Occupational History  . Not on file  Social Needs  . Financial resource strain: Not on file  . Food insecurity    Worry: Not on file    Inability: Not on file  . Transportation needs    Medical: Not on file    Non-medical: Not on file  Tobacco Use  . Smoking status: Never Smoker  . Smokeless tobacco: Never Used  Substance and Sexual Activity  . Alcohol use: No    Alcohol/week: 0.0 standard drinks  . Drug use: No  . Sexual activity: Not on file  Lifestyle  . Physical activity    Days per week: Not on file    Minutes per session: Not on file  . Stress: Not on file  Relationships  . Social connections  Talks on phone: Not on file    Gets together: Not on file    Attends religious service: Not on file    Active member of club or organization: Not on file    Attends meetings of clubs or organizations: Not on file    Relationship status: Not on file  . Intimate partner violence    Fear of current or ex partner: Not on file    Emotionally abused: Not on file    Physically abused: Not on file    Forced sexual activity: Not on file  Other Topics Concern  . Not on file  Social History Narrative   Retired Event organiser   Widow/Widower   lives in [place]   4 kids (2 sons, 2 daughters) one son died many years ago in car accident.  other kids are all in [place].   Has friend that helps take care of her    Family History  Problem Relation Age of Onset  . Cirrhosis Brother   . Heart disease Brother   . Heart attack Brother   . Hypertension Mother    . Hyperlipidemia Mother   . Hyperlipidemia Son   . Hypertension Son     Assessment & Plan:   See Encounters Tab for problem based charting.  Patient discussed with Dr. Lynnae January

## 2018-10-13 NOTE — Telephone Encounter (Signed)
Pt calls and states 3 hrs ago appr, she started having diarrhea, it then turned into blood w/ mucous slimy stuff. She denies fevers, chest pain, shortness of breath. She does agree to nausea, no vomiting. Able to drink water is not eating. She states she is having some incontinence of stool. States she has lower abd pain. Denies weakness. Refuses ED, ACC at 1545

## 2018-10-13 NOTE — Telephone Encounter (Signed)
Ok. Thanks!

## 2018-10-13 NOTE — Assessment & Plan Note (Signed)
5am had some loose stools had about 15-20 episodes large volume watery diarrhea at first then at 1300 today had painful large volume bloody bowel movements x2 mixed with mucous.  RLQ/suprapubic pain colicky, crampy.  No fevers or chills.  No recent abx use.  She is urinating less than usual and did take her lasix this morning.  No changes in diet fixes her own food at home had shephard's pie last night and fresh lima beans.  No pets at home.  No chickens or other farm animals lives in an apartment drinks city water.  Never had this issue before.  No sick contacts and lives alone.  Has not been around any children.  Currently feels weak and is in pain.  Not able to eat but has been drinking water to try and stay hydrated.    -Given her age, comorbidities and living alone she is high risk for further decompensation and will need further workup and possibly abx therapy observation -no beds available currently she will need to be transported to the ED for expedited workup and management

## 2018-10-13 NOTE — ED Triage Notes (Signed)
Pt here for bloody diarrhea onset this morning at 0500. Pt had a few episodes of just stool and then after lunch it turned to just blood, no stool. Endorses abdominal pain this morning but none on arrival. Denies blood thinners.

## 2018-10-13 NOTE — ED Provider Notes (Signed)
[facility] EMERGENCY DEPARTMENT Provider Note   CSN: [id number] Arrival date & time: 10/13/18  1630     History   Chief Complaint Chief Complaint  Patient presents with   Diarrhea    HPI [name] is a 78 y.o. female with PMHx CAD, CHF (EF 55-60% 06/25/17), CKD stage IV, Diabetes, Divertuculosis, HTN, HLD, resents to the ED today complaining of onset, intermittent, achy, periumbilical abdominal pain that began earlier today.  Patient reports she woke up this morning with diarrhea.  She reports that after multiple episodes of diarrhea she began to only pass bright red blood.  She went to her PCPs office today and was sent here for further evaluation.  Patient reports that she went to the bathroom while in the waiting room and passed blood as well although most recently tried to go to the bathroom and could not have a bowel movement but did not notice any blood.  She reports her most recent colonoscopy was approximately 4 years ago.  She denies any vomiting but reports she has begun to feel nauseated while in the waiting room today.  Denies fever, chills, chest pain, shortness of breath, dizziness, lightheadedness, or any other associated symptoms. PSHx includes hysterectomy.  Is on Plavix. No recent abx use.        Past Medical History:  Diagnosis Date   CAD (coronary artery disease) 2006   s/p Quadrupal CABG 2006   CHF (congestive heart failure) ([place]) 2010   EF 30-35% from Echo 06/2008   CKD (chronic kidney disease) stage 4, GFR 15-29 ml/min (HCC)    fluctuating between stage 3 and 4 depending on GFR   CKD (chronic kidney disease), stage III ([place]) 09/27/2013   De Quervain's tenosynovitis, left 11/13/2015   Diverticulosis    DIVERTICULOSIS OF COLON 04/01/2007   Qualifier: Diagnosis of  By: Harlon Ditty CMA (AAMA), [name]     DM (diabetes mellitus) ([place])    insulin dependent   Dyslipidemia 10/15/2005   Qualifier: Diagnosis of  By: Derrel Nip MD,  Helene Kelp     History of colonic polyps    HLD (hyperlipidemia)    HTN (hypertension)    Lumbar spinal stenosis    MI (myocardial infarction) ([place]) 2003   . [place]    Mouth ulcer adjacent to jaw bone 08/22/2010   S/p removal by ENT 09/2010    MYOCARDIAL INFARCTION, HX OF 09/09/2006   Qualifier: Diagnosis of  By: Marinda Elk MD, Sonia Side     Nephrolithiasis    hx of   NEPHROLITHIASIS 04/01/2007   Qualifier: Diagnosis of  By: Harlon Ditty CMA (AAMA), [name]     OA (osteoarthritis)    Osteopenia 2010   T score of -1.8   Psoriasis    PVD (peripheral vascular disease) ([place])    ABI indicated moderated reduction arterial flow on the left.   Tricuspid regurgitation 2009   moderate to severe, EF 55%    Patient Active Problem List   Diagnosis Date Noted   Bloody diarrhea 10/13/2018   Hyperlipidemia 08/05/2018   Fall 08/04/2018   GERD (gastroesophageal reflux disease) 01/17/2015   CKD (chronic kidney disease), stage III 09/27/2013   Colon cancer screening 12/29/2012   (HFpEF) heart failure with preserved ejection fraction ([place]) 04/22/2010   Essential hypertension 09/09/2006   SYMPTOM, COUGH 09/09/2006   Osteoarthritis of back 12/23/2005   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/15/2005   Peripheral vascular disease ([place]) 10/15/2005   Psoriasis 10/15/2005   Osteopenia 10/15/2005  Type 2 diabetes mellitus with stage 3 chronic kidney disease, with long-term current use of insulin ([place]) 01/14/2004    Past Surgical History:  Procedure Laterality Date   CATARACT EXTRACTION  2008   left eye   CORONARY ANGIOPLASTY WITH STENT PLACEMENT  2003   CORONARY ARTERY BYPASS GRAFT  2006   4 vessel   LAPAROSCOPIC SUPRACERVICAL HYSTERECTOMY     PERIPHERAL VASCULAR CATHETERIZATION N/A 07/03/2014   Procedure: Abdominal Aortogram w/Lower Extremity;  Surgeon: Angelia Mould, MD;  Location: [facility];  Service: Cardiovascular;  Laterality: N/A;     OB  History   No obstetric history on file.      Home Medications    Prior to Admission medications   Medication Sig Start Date End Date Taking? Authorizing Provider  ACCU-CHEK FASTCLIX LANCETS MISC Check blood sugar 3 times a day 03/01/18   Lucious Groves, DO  amLODipine (NORVASC) 10 MG tablet Take 1 tablet (10 mg total) by mouth daily. 10/14/17 10/14/18  [name], MD  aspirin EC 81 MG tablet Take 81 mg by mouth daily.    [provider]  atenolol (TENORMIN) 25 MG tablet TAKE 1/2 TABLET(12.5 MG) BY MOUTH DAILY 10/11/18   [name], MD  benzonatate (TESSALON) 100 MG capsule Take 1-2 capsules (100-200 mg total) by mouth 3 (three) times daily as needed for cough. Patient not taking: Reported on 10/22/2017 12/25/16   Forrest Moron, MD  Blood Glucose Monitoring Suppl (ACCU-CHEK GUIDE ME) w/Device KIT 1 each by Does not apply route 3 (three) times daily. 03/01/18   Lucious Groves, DO  Calcium Carb-Cholecalciferol (CALCIUM 600 + D PO) Take 1 tablet by mouth 2 (two) times daily.    [provider]  clopidogrel (PLAVIX) 75 MG tablet TAKE 1 TABLET(75 MG) BY MOUTH DAILY 10/11/18   [name], MD  dextromethorphan 15 MG/5ML syrup Take 10 mLs (30 mg total) by mouth 4 (four) times daily as needed for cough. 12/08/17   Asencion Noble, MD  diclofenac sodium (VOLTAREN) 1 % GEL Apply 2 g topically 4 (four) times daily. Patient not taking: Reported on 10/22/2017 12/05/15   Florinda Marker, MD  famotidine (PEPCID) 20 MG tablet TAKE 1 TABLET BY MOUTH TWICE DAILY 07/19/18   [name], MD  furosemide (LASIX) 40 MG tablet TAKE 1 TABLET(40 MG) BY MOUTH DAILY 02/24/18   [name], MD  glucose blood (ACCU-CHEK GUIDE) test strip Check blood sugar 3 times a day 03/01/18   Lucious Groves, DO  HUMIRA PEN 40 MG/0.8ML PNKT  06/18/15   [provider]  Insulin Glargine (LANTUS SOLOSTAR) 100 UNIT/ML Solostar Pen Inject 30 Units into the skin at bedtime.  10/29/17   [name], MD  Insulin Pen Needle (BD PEN NEEDLE NANO U/F) 32G X 4 MM MISC USE FOUR TIMES DAILY AS DIRECTED( BEFORE MEALS AND AT BEDTIME AS DIRECTED) 06/05/18   [name], MD  liraglutide (VICTOZA) 18 MG/3ML SOPN Inject 0.2 mLs (1.2 mg total) into the skin daily. 05/21/18   Sid Falcon, MD  nitroGLYCERIN (NITROSTAT) 0.3 MG SL tablet Place 1 tablet (0.3 mg total) under the tongue every 5 (five) minutes as needed for chest pain. 05/21/16   Burns, Arloa Koh, MD  olmesartan (BENICAR) 5 MG tablet Take 1 tablet (5 mg total) by mouth daily. 02/24/18   [name], MD  potassium chloride (K-DUR) 10 MEQ tablet Take 1 tablet (10 mEq total) by mouth 2 (two) times daily. 10/15/17  [name], MD  rosuvastatin (CRESTOR) 20 MG tablet TAKE 1 TABLET(20 MG) BY MOUTH DAILY 10/11/18   [name], MD    Family History Family History  Problem Relation Age of Onset   Cirrhosis Brother    Heart disease Brother    Heart attack Brother    Hypertension Mother    Hyperlipidemia Mother    Hyperlipidemia Son    Hypertension Son     Social History Social History   Tobacco Use   Smoking status: Never Smoker   Smokeless tobacco: Never Used  Substance Use Topics   Alcohol use: No    Alcohol/week: 0.0 standard drinks   Drug use: No     Allergies   Valsartan   Review of Systems Review of Systems  Constitutional: Negative for chills and fever.  HENT: Negative for congestion.   Eyes: Negative for visual disturbance.  Respiratory: Negative for cough and shortness of breath.   Cardiovascular: Negative for chest pain.  Gastrointestinal: Positive for abdominal pain, blood in stool, diarrhea and nausea. Negative for vomiting.  Genitourinary: Negative for difficulty urinating, dysuria and flank pain.  Musculoskeletal: Negative for myalgias.  Skin: Negative for rash.  Neurological: Negative for dizziness, syncope, light-headedness and headaches.      Physical Exam Updated Vital Signs BP (!) 178/66 (BP Location: Right Arm)    Pulse 84    Temp 98.3 F (36.8 C) (Oral)    Resp 20    SpO2 98%   Physical Exam Vitals signs and nursing note reviewed.  Constitutional:      Appearance: She is not ill-appearing.  HENT:     Head: Normocephalic and atraumatic.  Eyes:     Conjunctiva/sclera: Conjunctivae normal.  Neck:     Musculoskeletal: Neck supple.  Cardiovascular:     Rate and Rhythm: Normal rate and regular rhythm.     Pulses: Normal pulses.  Pulmonary:     Effort: Pulmonary effort is normal.     Breath sounds: Normal breath sounds. No wheezing, rhonchi or rales.  Abdominal:     Palpations: Abdomen is soft.     Tenderness: There is abdominal tenderness in the periumbilical area. There is guarding. There is no rebound. Negative signs include Rovsing's sign, McBurney's sign, psoas sign and obturator sign.     Comments: Abdomen appears mildly distended, TTP to periumbilical region with voluntary guarding, +BS throughout, neg murphy's, neg mcburney's, no CVA TTP   Genitourinary:    Comments: Chaperone present Antonietta Barcelona, RN 2 small external hemorrhoids present Stool light brown in color without any obvious blood or melena Musculoskeletal:     Right lower leg: No edema.     Left lower leg: No edema.  Skin:    General: Skin is warm and dry.  Neurological:     Mental Status: She is alert.      ED Treatments / Results  Labs (all labs ordered are listed, but only abnormal results are displayed) Labs Reviewed  COMPREHENSIVE METABOLIC PANEL - Abnormal; Notable for the following components:      Result Value   Glucose, Bld 237 (*)    Creatinine, Ser 1.37 (*)    GFR calc non Af Amer 36 (*)    GFR calc Af Amer 42 (*)    All other components within normal limits  CBC - Abnormal; Notable for the following components:   WBC 13.1 (*)    All other components within normal limits  POC OCCULT BLOOD, ED - Abnormal; Notable for  the following components:   Fecal Occult Bld POSITIVE (*)    All other components within normal limits  SARS CORONAVIRUS 2 (TAT 6-24 HRS)  LIPASE, BLOOD  LACTIC ACID, PLASMA  LACTIC ACID, PLASMA  TYPE AND SCREEN  ABO/RH    EKG None  Radiology Ct Abdomen Pelvis W Contrast  Result Date: 10/14/2018 CLINICAL DATA:  78 year old female with abdominal pain and GI bleed. EXAM: CT ABDOMEN AND PELVIS WITH CONTRAST TECHNIQUE: Multidetector CT imaging of the abdomen and pelvis was performed using the standard protocol following bolus administration of intravenous contrast. CONTRAST:  163m OMNIPAQUE IOHEXOL 300 MG/ML  SOLN COMPARISON:  None. FINDINGS: Lower chest: The visualized lung bases are clear. No intra-abdominal free air or free fluid. Hepatobiliary: A 1 cm focal subcapsular hypodense lesion in the anterior liver (series 3, image 12) is not well characterized but may represent focus of fatty infiltration. Other etiologies not excluded. Attention on follow-up imaging recommended. No intrahepatic biliary ductal dilatation. The gallbladder is unremarkable. Pancreas: There is atrophy of the pancreas. There is a 14 mm septated cyst or 2 adjacent cystic lesions in the neck of the pancreas which are not well characterized but may represent side branch IPMN or other serous or mucinous lesions. Further evaluation with MRI on a nonemergent basis recommended. Spleen: Normal in size without focal abnormality. Adrenals/Urinary Tract: The adrenal glands are unremarkable. There is no hydronephrosis on either side. There is atrophy of the inferior pole of the kidneys bilaterally. Multiple bilateral renal cysts and smaller hypodense lesions which are too small to characterize noted. The visualized ureters and urinary bladder appear unremarkable. Stomach/Bowel: There is sigmoid diverticulosis without active inflammatory changes. There is long segment inflammatory changes and circumferential thickening of the distal colon  starting at the distal transverse colon and involving the descending colon consistent with colitis. There is no bowel obstruction. The appendix is normal. Vascular/Lymphatic: Advanced aortoiliac atherosclerotic disease. There is advanced atherosclerotic calcification of the origins of the celiac axis, SMA, and IMA. These vessels appear patent. There is however probable diminished flow in the SMA. The SMV, splenic vein, and main portal vein are patent. No portal venous gas. There is no adenopathy. Reproductive: Hysterectomy. No pelvic mass. Other: None Musculoskeletal: Osteopenia with degenerative changes of the spine. No acute osseous pathology. IMPRESSION: 1. Colitis of the distal transverse and descending colon. No bowel obstruction. Normal appendix. 2. Sigmoid diverticulosis. 3. A 14 mm septated cyst or 2 adjacent cystic lesions in the neck of the pancreas. Further evaluation with MRI on a nonemergent basis recommended. Aortic Atherosclerosis (ICD10-I70.0). Electronically Signed   By: AAnner CreteM.D.   On: 10/14/2018 00:11    Procedures Procedures (including critical care time)  Medications Ordered in ED Medications  sodium chloride 0.9 % bolus 500 mL (has no administration in time range)  ondansetron (ZOFRAN) injection 4 mg (4 mg Intravenous Given 10/13/18 2253)  pantoprazole (PROTONIX) injection 40 mg (40 mg Intravenous Given 10/13/18 2253)  ciprofloxacin (CIPRO) tablet 500 mg (500 mg Oral Given 10/13/18 2253)  morphine 4 MG/ML injection 4 mg (4 mg Intravenous Given 10/13/18 2253)  iohexol (OMNIPAQUE) 300 MG/ML solution 100 mL (100 mLs Intravenous Contrast Given 10/13/18 2356)     Initial Impression / Assessment and Plan / ED Course  I have reviewed the triage vital signs and the nursing notes.  Pertinent labs & imaging results that were available during my care of the patient were reviewed by me and considered in my medical decision making (see chart  for details).    78 year old female  who presents to the ED today with bloody diarrhea and abdominal pain.  She does have tenderness to her periumbilical region with voluntary guarding.  And still has her appendix and gallbladder.  Has had a hysterectomy.  Vital signs stable in the ED.  Afebrile without tachycardia or tachypnea.  Blood pressure stable.  Labwork was obtained prior to being seen - leukocytosis present at 13,000.  Creatinine stable compared to baseline.  FOBT performed at bedside -we will appears light in color.  No obvious melena.  Did Return positive.  She does have 2 small external hemorrhoids that do not appear thrombosed at this time.   With bloody diarrhea and leukocytosis will start on Cipro.  Morphine and Zofran with IV fluids given for symptomatic relief.  Protonix also ordered with concern for possible GI bleed.  CT scan abdomen and pelvis ordered as well.   Lipase negative. Lactic acid within normal limits.   CT scan does show colitis as well as diverticulosis. Given pt age and complaint of gross blood feel she would benefit from admission at this time.   Discussed case with Internal Medicine who agrees to accept patient for admission at this time.   This note was prepared using Dragon voice recognition software and may include unintentional dictation errors due to the inherent limitations of voice recognition software.       Final Clinical Impressions(s) / ED Diagnoses   Final diagnoses:  Colitis  Blood in stool    ED Discharge Orders    None       Eustaquio Maize, PA-C 10/14/18 0973    Gareth Morgan, MD 10/14/18 [phone]

## 2018-10-14 DIAGNOSIS — Z794 Long term (current) use of insulin: Secondary | ICD-10-CM | POA: Diagnosis not present

## 2018-10-14 DIAGNOSIS — Z8249 Family history of ischemic heart disease and other diseases of the circulatory system: Secondary | ICD-10-CM | POA: Diagnosis not present

## 2018-10-14 DIAGNOSIS — I2510 Atherosclerotic heart disease of native coronary artery without angina pectoris: Secondary | ICD-10-CM

## 2018-10-14 DIAGNOSIS — Z951 Presence of aortocoronary bypass graft: Secondary | ICD-10-CM | POA: Diagnosis not present

## 2018-10-14 DIAGNOSIS — K921 Melena: Secondary | ICD-10-CM | POA: Diagnosis present

## 2018-10-14 DIAGNOSIS — M1990 Unspecified osteoarthritis, unspecified site: Secondary | ICD-10-CM | POA: Diagnosis present

## 2018-10-14 DIAGNOSIS — A040 Enteropathogenic Escherichia coli infection: Secondary | ICD-10-CM | POA: Diagnosis present

## 2018-10-14 DIAGNOSIS — I129 Hypertensive chronic kidney disease with stage 1 through stage 4 chronic kidney disease, or unspecified chronic kidney disease: Secondary | ICD-10-CM | POA: Diagnosis not present

## 2018-10-14 DIAGNOSIS — K529 Noninfective gastroenteritis and colitis, unspecified: Secondary | ICD-10-CM

## 2018-10-14 DIAGNOSIS — E1151 Type 2 diabetes mellitus with diabetic peripheral angiopathy without gangrene: Secondary | ICD-10-CM | POA: Diagnosis present

## 2018-10-14 DIAGNOSIS — Z8349 Family history of other endocrine, nutritional and metabolic diseases: Secondary | ICD-10-CM | POA: Diagnosis not present

## 2018-10-14 DIAGNOSIS — I130 Hypertensive heart and chronic kidney disease with heart failure and stage 1 through stage 4 chronic kidney disease, or unspecified chronic kidney disease: Secondary | ICD-10-CM | POA: Diagnosis present

## 2018-10-14 DIAGNOSIS — K5730 Diverticulosis of large intestine without perforation or abscess without bleeding: Secondary | ICD-10-CM | POA: Diagnosis present

## 2018-10-14 DIAGNOSIS — L409 Psoriasis, unspecified: Secondary | ICD-10-CM | POA: Diagnosis present

## 2018-10-14 DIAGNOSIS — E1122 Type 2 diabetes mellitus with diabetic chronic kidney disease: Secondary | ICD-10-CM | POA: Diagnosis present

## 2018-10-14 DIAGNOSIS — I252 Old myocardial infarction: Secondary | ICD-10-CM | POA: Diagnosis not present

## 2018-10-14 DIAGNOSIS — E785 Hyperlipidemia, unspecified: Secondary | ICD-10-CM

## 2018-10-14 DIAGNOSIS — Z888 Allergy status to other drugs, medicaments and biological substances status: Secondary | ICD-10-CM

## 2018-10-14 DIAGNOSIS — Z79899 Other long term (current) drug therapy: Secondary | ICD-10-CM

## 2018-10-14 DIAGNOSIS — M8580 Other specified disorders of bone density and structure, unspecified site: Secondary | ICD-10-CM | POA: Diagnosis present

## 2018-10-14 DIAGNOSIS — I071 Rheumatic tricuspid insufficiency: Secondary | ICD-10-CM | POA: Diagnosis present

## 2018-10-14 DIAGNOSIS — I509 Heart failure, unspecified: Secondary | ICD-10-CM | POA: Diagnosis present

## 2018-10-14 DIAGNOSIS — Z87442 Personal history of urinary calculi: Secondary | ICD-10-CM | POA: Diagnosis not present

## 2018-10-14 DIAGNOSIS — N183 Chronic kidney disease, stage 3 unspecified: Secondary | ICD-10-CM | POA: Diagnosis present

## 2018-10-14 DIAGNOSIS — Z20828 Contact with and (suspected) exposure to other viral communicable diseases: Secondary | ICD-10-CM | POA: Diagnosis present

## 2018-10-14 DIAGNOSIS — K862 Cyst of pancreas: Secondary | ICD-10-CM | POA: Diagnosis present

## 2018-10-14 DIAGNOSIS — Z7982 Long term (current) use of aspirin: Secondary | ICD-10-CM

## 2018-10-14 HISTORY — DX: Noninfective gastroenteritis and colitis, unspecified: K52.9

## 2018-10-14 LAB — CBC
HCT: 37.9 % (ref 36.0–46.0)
HCT: 36.9 % (ref 36.0–46.0)
Hemoglobin: 13.3 g/dL (ref 12.0–15.0)
Hemoglobin: 12.7 g/dL (ref 12.0–15.0)
MCH: 31.5 pg (ref 26.0–34.0)
MCH: 31.4 pg (ref 26.0–34.0)
MCHC: 35.1 g/dL (ref 30.0–36.0)
MCHC: 34.4 g/dL (ref 30.0–36.0)
MCV: 89.8 fL (ref 80.0–100.0)
MCV: 91.1 fL (ref 80.0–100.0)
Platelets: 151 10*3/uL (ref 150–400)
Platelets: 135 10*3/uL — ABNORMAL LOW (ref 150–400)
RBC: 4.22 MIL/uL (ref 3.87–5.11)
RBC: 4.05 MIL/uL (ref 3.87–5.11)
RDW: 12.5 % (ref 11.5–15.5)
RDW: 12.5 % (ref 11.5–15.5)
WBC: 10 10*3/uL (ref 4.0–10.5)
WBC: 9.6 10*3/uL (ref 4.0–10.5)
nRBC: 0 % (ref 0.0–0.2)
nRBC: 0 % (ref 0.0–0.2)

## 2018-10-14 LAB — CBG MONITORING, ED
Glucose-Capillary: 185 mg/dL — ABNORMAL HIGH (ref 70–99)
Glucose-Capillary: 176 mg/dL — ABNORMAL HIGH (ref 70–99)
Glucose-Capillary: 207 mg/dL — ABNORMAL HIGH (ref 70–99)
Glucose-Capillary: 135 mg/dL — ABNORMAL HIGH (ref 70–99)
Glucose-Capillary: 237 mg/dL — ABNORMAL HIGH (ref 70–99)

## 2018-10-14 LAB — C DIFFICILE QUICK SCREEN W PCR REFLEX
C Diff antigen: NEGATIVE
C Diff interpretation: NOT DETECTED
C Diff toxin: NEGATIVE

## 2018-10-14 LAB — SARS CORONAVIRUS 2 (TAT 6-24 HRS): SARS Coronavirus 2: NEGATIVE

## 2018-10-14 LAB — BASIC METABOLIC PANEL
Anion gap: 10 (ref 5–15)
BUN: 17 mg/dL (ref 8–23)
CO2: 28 mmol/L (ref 22–32)
Calcium: 8.1 mg/dL — ABNORMAL LOW (ref 8.9–10.3)
Chloride: 99 mmol/L (ref 98–111)
Creatinine, Ser: 1.25 mg/dL — ABNORMAL HIGH (ref 0.44–1.00)
GFR calc Af Amer: 47 mL/min — ABNORMAL LOW (ref >60)
GFR calc non Af Amer: 41 mL/min — ABNORMAL LOW (ref >60)
Glucose, Bld: 187 mg/dL — ABNORMAL HIGH (ref 70–99)
Potassium: 4 mmol/L (ref 3.5–5.1)
Sodium: 137 mmol/L (ref 135–145)

## 2018-10-14 LAB — LACTIC ACID, PLASMA: Lactic Acid, Venous: 1.8 mmol/L (ref 0.5–1.9)

## 2018-10-14 LAB — GLUCOSE, CAPILLARY: Glucose-Capillary: 201 mg/dL — ABNORMAL HIGH (ref 70–99)

## 2018-10-14 LAB — MRSA PCR SCREENING: MRSA by PCR: NEGATIVE

## 2018-10-14 MED ORDER — SODIUM CHLORIDE 0.9 % IV SOLN
3.0000 g | Freq: Two times a day (BID) | INTRAVENOUS | Status: DC
  Administered 2018-10-14 – 2018-10-15 (×3): 3 g via INTRAVENOUS
  Filled 2018-10-14 (×5): qty 8
  Filled 2018-10-14: qty 3

## 2018-10-14 MED ORDER — ROSUVASTATIN CALCIUM 20 MG PO TABS: 20.0000 mg | ORAL_TABLET | Freq: Every day | ORAL | Status: DC

## 2018-10-14 MED ORDER — LACTATED RINGERS IV BOLUS
500.0000 mL | Freq: Once | INTRAVENOUS | Status: AC
  Administered 2018-10-14: 500 mL via INTRAVENOUS

## 2018-10-14 MED ORDER — LACTATED RINGERS IV BOLUS
1000.0000 mL | Freq: Once | INTRAVENOUS | Status: AC
  Administered 2018-10-14: 01:00:00 1000 mL via INTRAVENOUS

## 2018-10-14 MED ORDER — INSULIN GLARGINE 100 UNIT/ML ~~LOC~~ SOLN
15.0000 [IU] | Freq: Every day | SUBCUTANEOUS | Status: DC
  Administered 2018-10-14 (×2): 15 [IU] via SUBCUTANEOUS
  Filled 2018-10-14 (×4): qty 0.15

## 2018-10-14 MED ORDER — SODIUM CHLORIDE 0.9 % IV SOLN: 500.0000 mg | INTRAVENOUS | Status: DC

## 2018-10-14 MED ORDER — INSULIN ASPART 100 UNIT/ML ~~LOC~~ SOLN
0.0000 [IU] | Freq: Three times a day (TID) | SUBCUTANEOUS | Status: DC
  Administered 2018-10-14: 12:00:00 2 [IU] via SUBCUTANEOUS
  Administered 2018-10-14 – 2018-10-15 (×3): 5 [IU] via SUBCUTANEOUS
  Administered 2018-10-15: 3 [IU] via SUBCUTANEOUS

## 2018-10-14 MED ORDER — ONDANSETRON HCL 4 MG PO TABS: 4.0000 mg | ORAL_TABLET | Freq: Four times a day (QID) | ORAL | Status: DC | PRN

## 2018-10-14 MED ORDER — ONDANSETRON HCL 4 MG/2ML IJ SOLN: 4.0000 mg | Freq: Four times a day (QID) | INTRAMUSCULAR | Status: DC | PRN

## 2018-10-14 MED ORDER — ENOXAPARIN SODIUM 30 MG/0.3ML ~~LOC~~ SOLN
30.0000 mg | SUBCUTANEOUS | Status: DC
  Administered 2018-10-14: 30 mg via SUBCUTANEOUS
  Filled 2018-10-14 (×2): qty 0.3

## 2018-10-14 MED ORDER — ACETAMINOPHEN 325 MG PO TABS
650.0000 mg | ORAL_TABLET | Freq: Four times a day (QID) | ORAL | Status: DC | PRN
  Administered 2018-10-14: 650 mg via ORAL
  Filled 2018-10-14: qty 2

## 2018-10-14 MED ORDER — INSULIN ASPART 100 UNIT/ML ~~LOC~~ SOLN
0.0000 [IU] | Freq: Every day | SUBCUTANEOUS | Status: DC
  Administered 2018-10-14: 2 [IU] via SUBCUTANEOUS

## 2018-10-14 MED ORDER — ACETAMINOPHEN 650 MG RE SUPP: 650.0000 mg | Freq: Four times a day (QID) | RECTAL | Status: DC | PRN

## 2018-10-14 NOTE — ED Notes (Signed)
Dinner tray ordered.

## 2018-10-14 NOTE — Progress Notes (Signed)
Pt has arrived to [address]. Identified appropriately, alert and oriented x 4, VS stable, no signs of acute distress. Pt ambulated from stretcher to be, steady gait. Cardiac monitor in place and CCMD notified. Pt oriented to room and equipment, advised about belongings policy, and instructed how to use call bell. Call bell left within reach. Will continue to monitor and treat pt per MD orders.

## 2018-10-14 NOTE — ED Notes (Signed)
Lunch tray ordered 

## 2018-10-14 NOTE — ED Notes (Signed)
Floor unable to take report at this time. Will call back,

## 2018-10-14 NOTE — H&P (Addendum)
Date: 10/14/2018               Patient Name:  [name] MRN: [id number]  DOB: 1940/07/24 Age / Sex: 78 y.o., female   PCP: [name], MD         Medical Service: Internal Medicine Teaching Service         Attending Physician: Dr. Velna Ochs, MD    First Contact: Dr. Mitzi Hansen  Pager: [phone]  Second Contact: Dr. Hayden Rasmussen Pager: [phone]        After Hours (After 5p/  First Contact Pager: [phone]  weekends / holidays): Second Contact Pager: [phone]   Chief Complaint: Bloody diarrhea  History of Present Illness: 78 y.o. yo female w/ PMH significant for CAD, HTN, DM, HLD, and CKDIII presenting with abdominal pain. History obtained via patient and chart review.   Patient in normal state of health until yesterday morning at 5am when she developed right sided abdominal pain and started having profuse watery diarrhea. After several episodes of watery diarrhea, she had a large bloody bowel movement which prompted her to seek medical care. She continued to have bloody bowel movements throughout the day which she reports were too many to quantify. She endorses abdominal pain prior to defecation that is slightly improved with defecation. The pain otherwise remains unchanged and is not relieved or exacerbated by movement. She reports the pain as a sharp/crampy feeling that is located primarily in the right lower quadrant. She reports decrease in PO intake as a result of the pain resulting in increased generalized fatigue.She denies any nausea or vomiting, fever or chills, recent sick contacts, recent antibiotic use, eating at a new restaurant or eating any foods with mayonnaise. She ate She[employer] pie and fresh lima beans on Tuesday evening. No headaches, dysuria, urinary or fecal incontinence, myalgias, arthralgias, or oral ulcers.   Patient was evaluated in clinic today for these complaints and was referred to the ED for expedited workup and management  given her high risk of decompensation and need for antibiotic therapy and observation. In the ED, patient was started on ciprofloxacin for colitis and admitted to IMTS for further work up.    Meds:  No outpatient medications have been marked as taking for the 10/13/18 encounter [facility] Encounter).     Allergies: Allergies as of 10/13/2018 - Review Complete 10/13/2018  Allergen Reaction Noted  . Valsartan Cough 09/19/2015   Past Medical History:  Diagnosis Date  . CAD (coronary artery disease) 2006   s/p Quadrupal CABG 2006  . CHF (congestive heart failure) ([place]) 2010   EF 30-35% from Echo 06/2008  . CKD (chronic kidney disease) stage 4, GFR 15-29 ml/min (HCC)    fluctuating between stage 3 and 4 depending on GFR  . CKD (chronic kidney disease), stage III ([place]) 09/27/2013  . De Quervain's tenosynovitis, left 11/13/2015  . Diverticulosis   . DIVERTICULOSIS OF COLON 04/01/2007   Qualifier: Diagnosis of  By: Nelson-Smith CMA (AAMA), [name]    . 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 Dyslipidemia 10/15/2005   Qualifier: Diagnosis of  By: Derrel Nip MD, Helene Kelp    . History of colonic polyps   . HLD (hyperlipidemia)   . HTN (hypertension)   . Lumbar spinal stenosis   . MI (myocardial infarction) ([place]) 2003   . [place]  . Mouth ulcer adjacent to jaw bone 08/22/2010   S/p removal by ENT 09/2010   . MYOCARDIAL INFARCTION, HX  OF 09/09/2006   Qualifier: Diagnosis of  By: Marinda Elk MD, Sonia Side    . Nephrolithiasis    hx of  . NEPHROLITHIASIS 04/01/2007   Qualifier: Diagnosis of  By: Harlon Ditty CMA (AAMA), [name]    . OA (osteoarthritis)   . Osteopenia 2010   T score of -1.8  . Psoriasis   . PVD (peripheral vascular disease) (HCC)    ABI indicated moderated reduction arterial flow on the left.  . Tricuspid regurgitation 2009   moderate to severe, EF 55%    Family History:  Family History  Problem Relation Age of Onset  . Cirrhosis Brother   . Heart disease Brother    . Heart attack Brother   . Hypertension Mother   . Hyperlipidemia Mother   . Hyperlipidemia Son   . Hypertension Son      Social History:  Social History   Tobacco Use  . Smoking status: Never Smoker  . Smokeless tobacco: Never Used  Substance Use Topics  . Alcohol use: No    Alcohol/week: 0.0 standard drinks  . Drug use: No     Review of Systems: A complete ROS was negative except as per HPI.   Physical Exam: Blood pressure (!) 139/53, pulse 69, temperature 98.3 F (36.8 C), temperature source Oral, resp. rate 18, SpO2 97 %. Physical Exam Constitutional:      General: She is in acute distress.     Appearance: Normal appearance. She is not diaphoretic.  HENT:     Head: Normocephalic and atraumatic.     Mouth/Throat:     Mouth: Mucous membranes are dry.     Pharynx: Oropharynx is clear. No oropharyngeal exudate or posterior oropharyngeal erythema.  Eyes:     General: No scleral icterus.    Extraocular Movements: Extraocular movements intact.     Conjunctiva/sclera: Conjunctivae normal.     Pupils: Pupils are equal, round, and reactive to light.  Neck:     Musculoskeletal: Normal range of motion and neck supple.  Cardiovascular:     Rate and Rhythm: Normal rate and regular rhythm.     Pulses: Normal pulses.     Heart sounds: No murmur. No friction rub. No gallop.   Pulmonary:     Effort: Pulmonary effort is normal. No respiratory distress.     Breath sounds: Normal breath sounds. No wheezing or rales.  Abdominal:     General: Bowel sounds are normal.     Palpations: Abdomen is soft. There is no mass.     Tenderness: There is abdominal tenderness. There is guarding. There is no rebound.     Comments: Tenderness to palpation in the RLQ with guarding   Musculoskeletal: Normal range of motion.        General: No swelling or tenderness.  Skin:    General: Skin is warm and dry.     Capillary Refill: Capillary refill takes less than 2 seconds.     Findings: No  erythema, lesion or rash.  Neurological:     General: No focal deficit present.     Mental Status: She is alert and oriented to person, place, and time. Mental status is at baseline.     Cranial Nerves: No cranial nerve deficit.     Sensory: No sensory deficit.     Motor: No weakness.     CT Abdomen Pelvis w/Contrast:  IMPRESSION: 1. Colitis of the distal transverse and descending colon. No bowel obstruction. Normal appendix. 2. Sigmoid diverticulosis. 3. A 14 mm septated  cyst or 2 adjacent cystic lesions in the neck ofthe pancreas. Further evaluation with MRI on a nonemergent basis recommended.   Assessment & Plan by Problem: Active Problems:   Acute colitis  Patient is an 78yo female with Hx of CAD, CKD III, HTN, HLD, and DM presenting with one day of bloody diarrhea and abdominal pain consistent with colitis on CT Abdomen.   Acute colitis Patient with one day duration of multiple episodes of bloody diarrhea and abdominal pain. She reports right lower quadrant abdominal pain sharp/cramping in nature that is slightly relieved with defecation. No prior history of similar episodes, no recent antibiotic use, no sick contacts, no unusual food consumption. On examination, patient appears to be volume down with dry mucus membranes. She is otherwise alert and oriented. She has normoactive bowel sounds and tenderness to palpation of the right lower quadrant with guarding. Mild abdominal distension noted on exam. CT Abdomen with colitis of distal transverse and descending colon w/o bowel obstruction or appendicitis. WBC 13.1 on CBC. On chart review, patient had colonoscopy in 2008 with removal of 6 polyps (sessile vs tubular adenomas) and patient had colonoscopy in 2013 but report unavailable. - Unasyn 3g IV q12h - Zofran q6h prn - IVF  - CBC and lactic acid - GI pathogen stool PCR - C.diff PCR   DM: Patient with Hx of DM. Home insulin regimen of Lantus 30U qHS, Victoza 1.2mg  qd - Lantus  15U subQ qHS - SSI - CBG monitoring  HTN: Patient on amlodipine 10mg , olmesartan 5mg  at home. BP 134/47 here.  - Continue home meds - Monitor vitals  CAD:  Continuing home medication of rosuvastatin 20mg  qd and BP meds as listed above.  Holding aspirin in setting of GI bleed. - Continue to monitor  CKDIII: BUN/Cr 19/1.37. Baseline Cr about 1.4-1.6.  - Continue to monitor with daily BMP  Diet: Clear liquid diet  Code: Full VTE Prophylaxis: Lovenox   Dispo: Admit patient to Inpatient with expected length of stay greater than 2 midnights.  SignedHarvie Heck, MD 10/14/2018, 2:00 AM  Pager: [phone]

## 2018-10-14 NOTE — Progress Notes (Signed)
  Date: 10/14/2018  Patient name: [name]  Medical record number: [id number]  Date of birth: 04-19-1940   This patient's plan of care was discussed with the house staff. Please see their note for complete details. I concur with their findings.  Please see my separate H&P for details.    Velna Ochs, MD 10/14/2018, 7:27 PM

## 2018-10-14 NOTE — Progress Notes (Signed)
Pharmacy Antibiotic Note  [name] is a 78 y.o. female admitted on 10/13/2018 with intra-abdominal infection.  Pharmacy has been consulted for unasyn dosing.  Presenting with abdominal pain and diarrhea. Received 1 dose of cipro. WBC 13.1, Scr 1.37 (CrCl 24 mL/min), LA 1.9, afebrile.   Plan: Unasyn 3 g IV every 12 hours  Monitor clinical pic, cx results, renal function     Temp (24hrs), Avg:98.6 F (37 C), Min:98.3 F (36.8 C), Max:98.8 F (37.1 C)  Recent Labs  Lab 10/13/18 1718 10/13/18 2314  WBC 13.1*  --   CREATININE 1.37*  --   LATICACIDVEN  --  1.9    Estimated Creatinine Clearance: 24.7 mL/min (A) (by C-G formula based on SCr of 1.37 mg/dL (H)).    Allergies  Allergen Reactions  . Valsartan Cough    Antimicrobials this admission: Cipro 9/30 x1 Unasyn 10/1 >>   Dose adjustments this admission: N/A  Microbiology results: 10/1 COVID: sent 10/1 C diff cx: sent   Thank you for allowing pharmacy to be a part of this patient's care.  Antonietta Jewel, PharmD, BCCCP Clinical Pharmacist  Phone: [phone]  Please check AMION for all [place] phone numbers After 10:00 PM, call [place] [phone] 10/14/2018 1:46 AM

## 2018-10-14 NOTE — ED Notes (Signed)
CBG Results of 237 reported to [place], Therapist, sports.

## 2018-10-14 NOTE — ED Notes (Signed)
Called and requested pt's lunch tray per new diet order.

## 2018-10-14 NOTE — ED Notes (Signed)
ED TO INPATIENT HANDOFF REPORT  ED Nurse Name and Phone #: Anabel Halon [phone]  S Name/Age/Gender [name] 78 y.o. female Room/Bed: 031C/031C  Code Status   Code Status: Full Code  Home/SNF/Other Home Patient oriented to: self, place, time and situation Is this baseline? No   Triage Complete: Triage complete  Chief Complaint Diarrhea; Rectal bleeding  Triage Note Pt here for bloody diarrhea onset this morning at 0500. Pt had a few episodes of just stool and then after lunch it turned to just blood, no stool. Endorses abdominal pain this morning but none on arrival. Denies blood thinners.    Allergies Allergies  Allergen Reactions  . Valsartan Cough    Level of Care/Admitting Diagnosis ED Disposition    ED Disposition Condition [facility]: [place] [100100]  Level of Care: Progressive [102]  Covid Evaluation: Asymptomatic Screening Protocol (No Symptoms)  Diagnosis: Acute colitis [283662]  Admitting Physician: Velna Ochs [[id number]]  Attending Physician: Velna Ochs [[id number]]  Estimated length of stay: past midnight tomorrow  Certification:: I certify this patient will need inpatient services for at least 2 midnights  PT Class (Do Not Modify): Inpatient [101]  PT Acc Code (Do Not Modify): Private [1]       B Medical/Surgery History Past Medical History:  Diagnosis Date  . CAD (coronary artery disease) 2006   s/p Quadrupal CABG 2006  . CHF (congestive heart failure) ([place]) 2010   EF 30-35% from Echo 06/2008  . CKD (chronic kidney disease) stage 4, GFR 15-29 ml/min (HCC)    fluctuating between stage 3 and 4 depending on GFR  . CKD (chronic kidney disease), stage III ([place]) 09/27/2013  . De Quervain's tenosynovitis, left 11/13/2015  . Diverticulosis   . DIVERTICULOSIS OF COLON 04/01/2007   Qualifier: Diagnosis of  By: Nelson-Smith CMA (AAMA), [name]    . 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 Dyslipidemia 10/15/2005   Qualifier: Diagnosis of  By: Derrel Nip MD, Helene Kelp    . History of colonic polyps   . HLD (hyperlipidemia)   . HTN (hypertension)   . Lumbar spinal stenosis   . MI (myocardial infarction) ([place]) 2003   . Barnett Vernon  . Mouth ulcer adjacent to jaw bone 08/22/2010   S/p removal by ENT 09/2010   . MYOCARDIAL INFARCTION, HX OF 09/09/2006   Qualifier: Diagnosis of  By: Marinda Elk MD, Sonia Side    . Nephrolithiasis    hx of  . NEPHROLITHIASIS 04/01/2007   Qualifier: Diagnosis of  By: Harlon Ditty CMA (AAMA), [name]    . OA (osteoarthritis)   . Osteopenia 2010   T score of -1.8  . Psoriasis   . PVD (peripheral vascular disease) (HCC)    ABI indicated moderated reduction arterial flow on the left.  . Tricuspid regurgitation 2009   moderate to severe, EF 55%   Past Surgical History:  Procedure Laterality Date  . CATARACT EXTRACTION  2008   left eye  . CORONARY ANGIOPLASTY WITH STENT PLACEMENT  2003  . CORONARY ARTERY BYPASS GRAFT  2006   4 vessel  . LAPAROSCOPIC SUPRACERVICAL HYSTERECTOMY    . PERIPHERAL VASCULAR CATHETERIZATION N/A 07/03/2014   Procedure: Abdominal Aortogram w/Lower Extremity;  Surgeon: Angelia Mould, MD;  Location: [facility];  Service: Cardiovascular;  Laterality: N/A;     A IV Location/Drains/Wounds Patient Lines/Drains/Airways Status   Active Line/Drains/Airways    Name:   Placement date:  Placement time:   Site:   Days:   Peripheral IV 10/13/18 Left Antecubital   10/13/18    2251    Antecubital   1          Intake/Output Last 24 hours  Intake/Output Summary (Last 24 hours) at 10/14/2018 1711 Last data filed at 10/14/2018 1100 Gross per 24 hour  Intake 100 ml  Output 300 ml  Net -200 ml    Labs/Imaging Results for orders placed or performed during the hospital encounter of 10/13/18 (from the past 48 hour(s))  Comprehensive metabolic panel     Status: Abnormal   Collection Time: 10/13/18  5:18 PM  Result  Value Ref Range   Sodium 138 135 - 145 mmol/L   Potassium 3.7 3.5 - 5.1 mmol/L   Chloride 98 98 - 111 mmol/L   CO2 28 22 - 32 mmol/L   Glucose, Bld 237 (H) 70 - 99 mg/dL   BUN 19 8 - 23 mg/dL   Creatinine, Ser 1.37 (H) 0.44 - 1.00 mg/dL   Calcium 9.6 8.9 - 10.3 mg/dL   Total Protein 7.1 6.5 - 8.1 g/dL   Albumin 4.2 3.5 - 5.0 g/dL   AST 20 15 - 41 U/L   ALT 18 0 - 44 U/L   Alkaline Phosphatase 62 38 - 126 U/L   Total Bilirubin 1.0 0.3 - 1.2 mg/dL   GFR calc non Af Amer 36 (L) >60 mL/min   GFR calc Af Amer 42 (L) >60 mL/min   Anion gap 12 5 - 15    Comment: Performed at [facility], [address], [place], [place] [address]  CBC     Status: Abnormal   Collection Time: 10/13/18  5:18 PM  Result Value Ref Range   WBC 13.1 (H) 4.0 - 10.5 K/uL   RBC 4.81 3.87 - 5.11 MIL/uL   Hemoglobin 15.0 12.0 - 15.0 g/dL   HCT 43.0 36.0 - 46.0 %   MCV 89.4 80.0 - 100.0 fL   MCH 31.2 26.0 - 34.0 pg   MCHC 34.9 30.0 - 36.0 g/dL   RDW 12.2 11.5 - 15.5 %   Platelets 174 150 - 400 K/uL   nRBC 0.0 0.0 - 0.2 %    Comment: Performed at [facility], [place] [address] [place], [place] [address]  Type and screen [place]     Status: None   Collection Time: 10/13/18  5:18 PM  Result Value Ref Range   ABO/RH(D) AB POS    Antibody Screen NEG    Sample Expiration      10/16/2018,2359 Performed at [facility], [place] [address], [place], [place] [address]   ABO/Rh     Status: None   Collection Time: 10/13/18  5:18 PM  Result Value Ref Range   ABO/RH(D)      AB POS Performed at [facility] [address] [place], [place] [address]   POC occult blood, ED     Status: Abnormal   Collection Time: 10/13/18 10:20 PM  Result Value Ref Range   Fecal Occult Bld POSITIVE (A) NEGATIVE  Lipase, blood     Status: None   Collection Time: 10/13/18 10:59 PM  Result Value Ref Range   Lipase 17 11 - 51 U/L    Comment: Performed at [facility] [address], [place], [place] [address]  Lactic acid, plasma     Status: None  Collection Time: 10/13/18 11:14 PM  Result Value Ref Range   Lactic Acid, Venous 1.9 0.5 - 1.9 mmol/L    Comment: Performed at [facility] [address] [place], [place] [address]  SARS CORONAVIRUS 2 (TAT 6-24 HRS) Nasopharyngeal Nasopharyngeal Swab     Status: None   Collection Time: 10/14/18  1:17 AM   Specimen: Nasopharyngeal Swab  Result Value Ref Range   SARS Coronavirus 2 NEGATIVE NEGATIVE    Comment: (NOTE) SARS-CoV-2 target nucleic acids are NOT DETECTED. The SARS-CoV-2 RNA is generally detectable in upper and lower respiratory specimens during the acute phase of infection. Negative results do not preclude SARS-CoV-2 infection, do not rule out co-infections with other pathogens, and should not be used as the sole basis for treatment or other patient management decisions. Negative results must be combined with clinical observations, patient history, and epidemiological information. The expected result is Negative. Fact Sheet for Patients: SugarRoll.be Fact Sheet for Healthcare Providers: https://www.woods-mathews.com/ This test is not yet approved or cleared by the [place] FDA and  has been authorized for detection and/or diagnosis of SARS-CoV-2 by FDA under an Emergency Use Authorization (EUA). This EUA will remain  in effect (meaning this test can be used) for the duration of the COVID-19 declaration under Section 56 4(b)(1) of the Act, 21 U.S.C. section 360bbb-3(b)(1), unless the authorization is terminated or revoked sooner. Performed at [facility], [place] [address] [place], [place] [address]   CBG monitoring, ED     Status: Abnormal   Collection Time: 10/14/18  3:01 AM  Result Value Ref Range   Glucose-Capillary 185 (H) 70 - 99 mg/dL  Lactic acid, plasma     Status: None   Collection Time: 10/14/18  3:22 AM  Result Value Ref Range    Lactic Acid, Venous 1.8 0.5 - 1.9 mmol/L    Comment: Performed at [facility], [place] [address] [place], [place] [address]  Basic metabolic panel     Status: Abnormal   Collection Time: 10/14/18  3:22 AM  Result Value Ref Range   Sodium 137 135 - 145 mmol/L   Potassium 4.0 3.5 - 5.1 mmol/L   Chloride 99 98 - 111 mmol/L   CO2 28 22 - 32 mmol/L   Glucose, Bld 187 (H) 70 - 99 mg/dL   BUN 17 8 - 23 mg/dL   Creatinine, Ser 1.25 (H) 0.44 - 1.00 mg/dL   Calcium 8.1 (L) 8.9 - 10.3 mg/dL   GFR calc non Af Amer 41 (L) >60 mL/min   GFR calc Af Amer 47 (L) >60 mL/min   Anion gap 10 5 - 15    Comment: Performed at [facility] [address], [place], [place] [address]  CBC     Status: None   Collection Time: 10/14/18  3:22 AM  Result Value Ref Range   WBC 10.0 4.0 - 10.5 K/uL   RBC 4.22 3.87 - 5.11 MIL/uL   Hemoglobin 13.3 12.0 - 15.0 g/dL   HCT 37.9 36.0 - 46.0 %   MCV 89.8 80.0 - 100.0 fL   MCH 31.5 26.0 - 34.0 pg   MCHC 35.1 30.0 - 36.0 g/dL   RDW 12.5 11.5 - 15.5 %   Platelets 151 150 - 400 K/uL   nRBC 0.0 0.0 - 0.2 %    Comment: Performed at [facility], [place] [address], [place],  [address]  CBG monitoring, ED  Status: Abnormal   Collection Time: 10/14/18  5:11 AM  Result Value Ref Range   Glucose-Capillary 176 (H) 70 - 99 mg/dL  CBG monitoring, ED     Status: Abnormal   Collection Time: 10/14/18  8:22 AM  Result Value Ref Range   Glucose-Capillary 207 (H) 70 - 99 mg/dL  CBG monitoring, ED     Status: Abnormal   Collection Time: 10/14/18 12:13 PM  Result Value Ref Range   Glucose-Capillary 135 (H) 70 - 99 mg/dL  C difficile quick scan w PCR reflex     Status: None   Collection Time: 10/14/18  2:55 PM   Specimen: STOOL  Result Value Ref Range   C Diff antigen NEGATIVE NEGATIVE   C Diff toxin NEGATIVE NEGATIVE   C Diff interpretation No C. difficile detected.     Comment: Performed at [facility], [place] [address] [place], [place] [address]    Ct Abdomen Pelvis W Contrast  Result Date: 10/14/2018 CLINICAL DATA:  78 year old female with abdominal pain and GI bleed. EXAM: CT ABDOMEN AND PELVIS WITH CONTRAST TECHNIQUE: Multidetector CT imaging of the abdomen and pelvis was performed using the standard protocol following bolus administration of intravenous contrast. CONTRAST:  160mL OMNIPAQUE IOHEXOL 300 MG/ML  SOLN COMPARISON:  None. FINDINGS: Lower chest: The visualized lung bases are clear. No intra-abdominal free air or free fluid. Hepatobiliary: A 1 cm focal subcapsular hypodense lesion in the anterior liver (series 3, image 12) is not well characterized but may represent focus of fatty infiltration. Other etiologies not excluded. Attention on follow-up imaging recommended. No intrahepatic biliary ductal dilatation. The gallbladder is unremarkable. Pancreas: There is atrophy of the pancreas. There is a 14 mm septated cyst or 2 adjacent cystic lesions in the neck of the pancreas which are not well characterized but may represent side branch IPMN or other serous or mucinous lesions. Further evaluation with MRI on a nonemergent basis recommended. Spleen: Normal in size without focal abnormality. Adrenals/Urinary Tract: The adrenal glands are unremarkable. There is no hydronephrosis on either side. There is atrophy of the inferior pole of the kidneys bilaterally. Multiple bilateral renal cysts and smaller hypodense lesions which are too small to characterize noted. The visualized ureters and urinary bladder appear unremarkable. Stomach/Bowel: There is sigmoid diverticulosis without active inflammatory changes. There is long segment inflammatory changes and circumferential thickening of the distal colon starting at the distal transverse colon and involving the descending colon consistent with colitis. There is no bowel obstruction. The appendix is normal. Vascular/Lymphatic: Advanced aortoiliac atherosclerotic disease. There is advanced atherosclerotic  calcification of the origins of the celiac axis, SMA, and IMA. These vessels appear patent. There is however probable diminished flow in the SMA. The SMV, splenic vein, and main portal vein are patent. No portal venous gas. There is no adenopathy. Reproductive: Hysterectomy. No pelvic mass. Other: None Musculoskeletal: Osteopenia with degenerative changes of the spine. No acute osseous pathology. IMPRESSION: 1. Colitis of the distal transverse and descending colon. No bowel obstruction. Normal appendix. 2. Sigmoid diverticulosis. 3. A 14 mm septated cyst or 2 adjacent cystic lesions in the neck of the pancreas. Further evaluation with MRI on a nonemergent basis recommended. Aortic Atherosclerosis (ICD10-I70.0). Electronically Signed   By: Anner Crete M.D.   On: 10/14/2018 00:11    Pending Labs Unresulted Labs (From admission, onward)    Start     Ordered   10/14/18 0500  CBC  Now then every 12 hours,   R (  with STAT occurrences)     10/14/18 0118   10/14/18 0114  GI pathogen panel by PCR, stool  (Gastrointestinal Panel by PCR, Stool                                                                                                                                                     *Does Not include CLOSTRIDIUM DIFFICILE testing.**If CDIFF testing is needed, select the C Difficile Quick Screen w PCR reflex order below)  Once,   STAT     10/14/18 0113          Vitals/Pain Today's Vitals   10/14/18 1400 10/14/18 1500 10/14/18 1501 10/14/18 1602  BP: 129/61 (!) 157/54  (!) 140/55  Pulse: 74 70  93  Resp:    18  Temp:      TempSrc:      SpO2: 97% 94%  93%  PainSc:   0-No pain     Isolation Precautions Enteric precautions (UV disinfection)  Medications Medications  rosuvastatin (CRESTOR) tablet 20 mg (has no administration in time range)  enoxaparin (LOVENOX) injection 30 mg (30 mg Subcutaneous Given 10/14/18 0826)  acetaminophen (TYLENOL) tablet 650 mg (has no administration in time  range)    Or  acetaminophen (TYLENOL) suppository 650 mg (has no administration in time range)  ondansetron (ZOFRAN) tablet 4 mg (has no administration in time range)    Or  ondansetron (ZOFRAN) injection 4 mg (has no administration in time range)  insulin aspart (novoLOG) injection 0-15 Units (2 Units Subcutaneous Given 10/14/18 1225)  insulin glargine (LANTUS) injection 15 Units (15 Units Subcutaneous Given 10/14/18 0824)  insulin aspart (novoLOG) injection 0-5 Units (0 Units Subcutaneous Not Given 10/14/18 0533)  Ampicillin-Sulbactam (UNASYN) 3 g in sodium chloride 0.9 % 100 mL IVPB (0 g Intravenous Stopped 10/14/18 0709)  ondansetron (ZOFRAN) injection 4 mg (4 mg Intravenous Given 10/13/18 2253)  pantoprazole (PROTONIX) injection 40 mg (40 mg Intravenous Given 10/13/18 2253)  ciprofloxacin (CIPRO) tablet 500 mg (500 mg Oral Given 10/13/18 2253)  morphine 4 MG/ML injection 4 mg (4 mg Intravenous Given 10/13/18 2253)  iohexol (OMNIPAQUE) 300 MG/ML solution 100 mL (100 mLs Intravenous Contrast Given 10/13/18 2356)  lactated ringers bolus 1,000 mL (0 mLs Intravenous Stopped 10/14/18 0457)    Followed by  lactated ringers bolus 500 mL (0 mLs Intravenous Stopped 10/14/18 0540)    Mobility walks Moderate fall risk   Focused Assessments GI: c/o diarrhea   R Recommendations: See Admitting Provider Note  Report given to:   Additional Notes:

## 2018-10-14 NOTE — Progress Notes (Addendum)
   Subjective:  Patient states she is feeling much better. She endorses some weakness but denies abdominal pain or nausea. She has not had a bowel movement since yesterday when she states she was having diarrhea and blood in her stool.    Objective:  Vital signs in last 24 hours: Vitals:   10/14/18 1300 10/14/18 1400 10/14/18 1500 10/14/18 1602  BP: (!) 135/54 129/61 (!) 157/54 (!) 140/55  Pulse: 60 74 70 93  Resp:    18  Temp:      TempSrc:      SpO2: 93% 97% 94% 93%   Constitution: NAD, supine in bed Cardio: RRR, no m/r/g, no LE edema   Respiratory: CTA, no w/r/r  Abdominal: NTTP, soft, non-distended  Neuro: a&o, normal affect Skin: c/d/i    Assessment/Plan:  Principal Problem:   Acute colitis  78yo female with PMH CAD, CKD III, HLD, TIIDM presenting with one day of bloody diarrhea and abdominal pain with colitis on CT.   Acute Colitis Diarrhea has resolved. Symptoms likely secondary to infectious colitis with consistent findings on CT.Will cont. IV abx and monitor CBC and plan to likely switch to po tomorrow.   - am CBC, BMP  - cont. Unasyn  - GI pathogen stool PCR and C. Diff  - advance to regular diet   DM - Lantus 15U subQ qHS - SSI - CBG monitoring  HTN: Patient on amlodipine 10mg , olmesartan 5mg  at home. .  - holding home meds in setting of gi bleed  CAD:  Home meds include rosuvastatin 20mg  qd and BP meds as listed above.  - Holding aspirin in setting of GI bleed, cont. Statin   CKDIII: BUN/Cr 19/1.37. Baseline Cr about 1.4-1.6.  - Continue to monitor with daily BMP  VTE: lovenox  IVF: none Diet: regular  Code: full   Dispo: Anticipated discharge in approximately tomorrow.   Marty Heck, DO 10/14/2018, 4:52 PM Pager: [phone]

## 2018-10-14 NOTE — Evaluation (Signed)
Physical Therapy Evaluation Patient Details Name: Nataliya Graig MRN: [id number] DOB: 10-Mar-1940 Today's Date: 10/14/2018   History of Present Illness  Pt is an 78 y/o female admitted secondary to bloody diarrhea. Found to have colitis. PMH includes HTN, CAD s/p CABG, CKD, PVD, and DM.   Clinical Impression  Pt admitted secondary to problem above with deficits below. Pt requiring HHA and min guard for mobility tasks within the room this session. Pt reports some increased weakness in BLEs during mobility tasks, but did not seem to affect mobility. Pt currently lives alone, but reports a friend can check on her. Will continue to follow acutely to maximize functional mobility independence and safety.     Follow Up Recommendations Home health PT(for safety eval)    Equipment Recommendations  None recommended by PT    Recommendations for Other Services       Precautions / Restrictions Precautions Precautions: Fall Precaution Comments: Reports fall in bathtub 2-3 months ago.  Restrictions Weight Bearing Restrictions: No      Mobility  Bed Mobility Overal bed mobility: Needs Assistance Bed Mobility: Supine to Sit     Supine to sit: Min assist     General bed mobility comments: Min A for trunk elevation. Increased time to come to EOB.   Transfers Overall transfer level: Needs assistance Equipment used: 1 person hand held assist Transfers: Sit to/from Stand Sit to Stand: Min guard         General transfer comment: Min guard for safety.   Ambulation/Gait Ambulation/Gait assistance: Min guard Gait Distance (Feet): 30 Feet Assistive device: 1 person hand held assist Gait Pattern/deviations: Step-through pattern;Decreased stride length Gait velocity: Decreased   General Gait Details: Slow, cautious gait. Pt overall steady with HHA; no LOB noted. Gait limited to within the room as pt in the ED and RN instructed to stay in the room as pt with Cdiff precautions.    Stairs            Wheelchair Mobility    Modified Rankin (Stroke Patients Only)       Balance Overall balance assessment: Needs assistance Sitting-balance support: No upper extremity supported;Feet supported Sitting balance-Leahy Scale: Fair     Standing balance support: Single extremity supported;During functional activity Standing balance-Leahy Scale: Fair                               Pertinent Vitals/Pain Pain Assessment: No/denies pain    Home Living Family/patient expects to be discharged to:: Private residence Living Arrangements: Alone Available Help at Discharge: Friend(s);Available PRN/intermittently Type of Home: Apartment Home Access: Level entry     Home Layout: One level Home Equipment: Walker - 2 wheels;Walker - 4 wheels;Shower seat      Prior Function Level of Independence: Independent               Hand Dominance        Extremity/Trunk Assessment   Upper Extremity Assessment Upper Extremity Assessment: Defer to OT evaluation    Lower Extremity Assessment Lower Extremity Assessment: Generalized weakness    Cervical / Trunk Assessment Cervical / Trunk Assessment: Normal  Communication   Communication: No difficulties  Cognition Arousal/Alertness: Awake/alert Behavior During Therapy: WFL for tasks assessed/performed Overall Cognitive Status: Within Functional Limits for tasks assessed  General Comments      Exercises     Assessment/Plan    PT Assessment Patient needs continued PT services  PT Problem List Decreased strength;Decreased activity tolerance;Decreased mobility;Decreased knowledge of use of DME       PT Treatment Interventions Gait training;Functional mobility training;Therapeutic exercise;Therapeutic activities;Balance training;Patient/family education    PT Goals (Current goals can be found in the Care Plan section)  Acute Rehab PT  Goals Patient Stated Goal: to feel better PT Goal Formulation: With patient Time For Goal Achievement: 10/28/18 Potential to Achieve Goals: Good    Frequency Min 3X/week   Barriers to discharge Decreased caregiver support      Co-evaluation               AM-PAC PT "6 Clicks" Mobility  Outcome Measure Help needed turning from your back to your side while in a flat bed without using bedrails?: A Little Help needed moving from lying on your back to sitting on the side of a flat bed without using bedrails?: A Little Help needed moving to and from a bed to a chair (including a wheelchair)?: A Little Help needed standing up from a chair using your arms (e.g., wheelchair or bedside chair)?: A Little Help needed to walk in hospital room?: A Little Help needed climbing 3-5 steps with a railing? : A Little 6 Click Score: 18    End of Session   Activity Tolerance: Patient tolerated treatment well Patient left: in bed;with call bell/phone within reach;with nursing/sitter in room(sitting EOB with RN in room ) Nurse Communication: Mobility status PT Visit Diagnosis: Muscle weakness (generalized) (M62.81)    Time: 5638-7564 PT Time Calculation (min) (ACUTE ONLY): 15 min   Charges:   PT Evaluation $PT Eval Low Complexity: [place], PT, DPT  Acute Rehabilitation Services  Pager: [phone] Office: [phone]   Rudean Hitt 10/14/2018, 3:30 PM

## 2018-10-15 DIAGNOSIS — A040 Enteropathogenic Escherichia coli infection: Principal | ICD-10-CM

## 2018-10-15 LAB — CBC
HCT: 32.7 % — ABNORMAL LOW (ref 36.0–46.0)
Hemoglobin: 11.3 g/dL — ABNORMAL LOW (ref 12.0–15.0)
MCH: 31 pg (ref 26.0–34.0)
MCHC: 34.6 g/dL (ref 30.0–36.0)
MCV: 89.8 fL (ref 80.0–100.0)
Platelets: 135 10*3/uL — ABNORMAL LOW (ref 150–400)
RBC: 3.64 MIL/uL — ABNORMAL LOW (ref 3.87–5.11)
RDW: 12.4 % (ref 11.5–15.5)
WBC: 8.5 10*3/uL (ref 4.0–10.5)
nRBC: 0 % (ref 0.0–0.2)

## 2018-10-15 LAB — GLUCOSE, CAPILLARY
Glucose-Capillary: 153 mg/dL — ABNORMAL HIGH (ref 70–99)
Glucose-Capillary: 208 mg/dL — ABNORMAL HIGH (ref 70–99)

## 2018-10-15 MED ORDER — AMOXICILLIN-POT CLAVULANATE 875-125 MG PO TABS: 1.0000 | ORAL_TABLET | Freq: Every day | ORAL | 0 refills | Status: AC

## 2018-10-15 MED ORDER — INFLUENZA VAC A&B SA ADJ QUAD 0.5 ML IM PRSY: 0.5000 mL | PREFILLED_SYRINGE | INTRAMUSCULAR | Status: DC

## 2018-10-15 NOTE — Progress Notes (Signed)
  Date: 10/15/2018  Patient name: [name]  Medical record number: [id number]  Date of birth: April 05, 1940        I have seen and evaluated this patient and I have discussed the plan of care with the house staff. Please see their note for complete details. I concur with their findings.  Diarrhea improving, C. Diff studies negative. GI PCR panel is pending. She denies further episodes of bloody diarrhea. Tolerating PO. Will discharge with po antibiotics to complete course. She is overdue for screening colonoscopy, she will need outpatient follow up with GI.   Velna Ochs, MD 10/15/2018, 12:02 PM

## 2018-10-15 NOTE — Plan of Care (Signed)

## 2018-10-15 NOTE — Progress Notes (Signed)
10/15/18 1219  PT Visit Information  Last PT Received On 10/15/18  Assistance Needed +1  History of Present Illness Pt is an 78 y/o female admitted secondary to bloody diarrhea. Found to have colitis. PMH includes HTN, CAD s/p CABG, CKD, PVD, and DM.   Subjective Data  Patient Stated Goal go home  Precautions  Precautions Fall  Precaution Comments Reports fall in bathtub 2-3 months ago.   Restrictions  Weight Bearing Restrictions No  Pain Assessment  Pain Assessment No/denies pain  Cognition  Arousal/Alertness Awake/alert  Behavior During Therapy WFL for tasks assessed/performed  Overall Cognitive Status Within Functional Limits for tasks assessed  Bed Mobility  General bed mobility comments Sitting EOB upon entry.   Transfers  Overall transfer level Needs assistance  Equipment used None  Transfers Sit to/from Stand  Sit to Stand Supervision  General transfer comment Supervision for safety.   Ambulation/Gait  Ambulation/Gait assistance Supervision  Gait Distance (Feet) 150 Feet  Assistive device None  Gait Pattern/deviations Step-through pattern;Decreased stride length;Drifts right/left  General Gait Details Overall steady gait. One mild LOB noted, however, pt able to self recover. Overall required supervision for safety.   Gait velocity Decreased   Balance  Overall balance assessment Needs assistance  Sitting-balance support No upper extremity supported;Feet supported  Sitting balance-Leahy Scale Good  Standing balance support No upper extremity supported;During functional activity  Standing balance-Leahy Scale Good  Exercises  Exercises General Lower Extremity  General Exercises - Lower Extremity  Long Arc Quad AROM;Both;10 reps;Seated  Hip Flexion/Marching AROM;Both;10 reps;Seated  PT - End of Session  Activity Tolerance Patient tolerated treatment well  Patient left in bed;with call bell/phone within reach;Other (comment) (seated EOB )  Nurse Communication  Mobility status   PT - Assessment/Plan  PT Plan Current plan remains appropriate  PT Visit Diagnosis Muscle weakness (generalized) (M62.81)  PT Frequency (ACUTE ONLY) Min 3X/week  Follow Up Recommendations Home health PT (for safety eval)  PT equipment None recommended by PT  AM-PAC PT "6 Clicks" Mobility Outcome Measure (Version 2)  Help needed turning from your back to your side while in a flat bed without using bedrails? 4  Help needed moving from lying on your back to sitting on the side of a flat bed without using bedrails? 4  Help needed moving to and from a bed to a chair (including a wheelchair)? 3  Help needed standing up from a chair using your arms (e.g., wheelchair or bedside chair)? 3  Help needed to walk in hospital room? 3  Help needed climbing 3-5 steps with a railing?  3  6 Click Score 20  Consider Recommendation of Discharge To: Home with no services  PT Goal Progression  Progress towards PT goals Progressing toward goals  Acute Rehab PT Goals  PT Goal Formulation With patient  Time For Goal Achievement 10/28/18  Potential to Achieve Goals Good  PT Time Calculation  PT Start Time (ACUTE ONLY) 1145  PT Stop Time (ACUTE ONLY) 1158  PT Time Calculation (min) (ACUTE ONLY) 13 min  PT General Charges  $$ ACUTE PT VISIT 1 Visit  PT Treatments  $Gait Training 8-22 mins     10/15/18 1219  PT Visit Information  Last PT Received On 10/15/18  Assistance Needed +1  History of Present Illness Pt is an 78 y/o female admitted secondary to bloody diarrhea. Found to have colitis. PMH includes HTN, CAD s/p CABG, CKD, PVD, and DM.   Subjective Data  Patient Stated Goal  go home  Precautions  Precautions Fall  Precaution Comments Reports fall in bathtub 2-3 months ago.   Restrictions  Weight Bearing Restrictions No  Pain Assessment  Pain Assessment No/denies pain  Cognition  Arousal/Alertness Awake/alert  Behavior During Therapy WFL for tasks assessed/performed  Overall  Cognitive Status Within Functional Limits for tasks assessed  Bed Mobility  General bed mobility comments Sitting EOB upon entry.   Transfers  Overall transfer level Needs assistance  Equipment used None  Transfers Sit to/from Stand  Sit to Stand Supervision  General transfer comment Supervision for safety.   Ambulation/Gait  Ambulation/Gait assistance Supervision  Gait Distance (Feet) 150 Feet  Assistive device None  Gait Pattern/deviations Step-through pattern;Decreased stride length;Drifts right/left  General Gait Details Overall steady gait. One mild LOB noted, however, pt able to self recover. Overall required supervision for safety.   Gait velocity Decreased   Balance  Overall balance assessment Needs assistance  Sitting-balance support No upper extremity supported;Feet supported  Sitting balance-Leahy Scale Good  Standing balance support No upper extremity supported;During functional activity  Standing balance-Leahy Scale Good  Exercises  Exercises General Lower Extremity  General Exercises - Lower Extremity  Long Arc Quad AROM;Both;10 reps;Seated  Hip Flexion/Marching AROM;Both;10 reps;Seated  PT - End of Session  Activity Tolerance Patient tolerated treatment well  Patient left in bed;with call bell/phone within reach;Other (comment) (seated EOB )  Nurse Communication Mobility status   PT - Assessment/Plan  PT Plan Current plan remains appropriate  PT Visit Diagnosis Muscle weakness (generalized) (M62.81)  PT Frequency (ACUTE ONLY) Min 3X/week  Follow Up Recommendations Home health PT (for safety eval)  PT equipment None recommended by PT  AM-PAC PT "6 Clicks" Mobility Outcome Measure (Version 2)  Help needed turning from your back to your side while in a flat bed without using bedrails? 4  Help needed moving from lying on your back to sitting on the side of a flat bed without using bedrails? 4  Help needed moving to and from a bed to a chair (including a  wheelchair)? 3  Help needed standing up from a chair using your arms (e.g., wheelchair or bedside chair)? 3  Help needed to walk in hospital room? 3  Help needed climbing 3-5 steps with a railing?  3  6 Click Score 20  Consider Recommendation of Discharge To: Home with no services  PT Goal Progression  Progress towards PT goals Progressing toward goals  Acute Rehab PT Goals  PT Goal Formulation With patient  Time For Goal Achievement 10/28/18  Potential to Achieve Goals Good  PT Time Calculation  PT Start Time (ACUTE ONLY) 1145  PT Stop Time (ACUTE ONLY) 1158  PT Time Calculation (min) (ACUTE ONLY) 13 min  PT General Charges  $$ ACUTE PT VISIT 1 Visit  PT Treatments  $Gait Training 8-22 mins   Pt progressing towards goals. Overall requiring supervision for mobility tasks. One mild LOB noted, however, pt able to self recover. Reviewed seated HEP with pt. Current recommendations appropriate. Will continue to follow acutely to maximize functional mobility independence and safety.   Leighton Ruff, PT, DPT  Acute Rehabilitation Services  Pager: [phone] Office: [phone]

## 2018-10-15 NOTE — Evaluation (Signed)
Occupational Therapy Evaluation Patient Details Name: [name] MRN: [id number] DOB: 1940-04-01 Today's Date: 10/15/2018    History of Present Illness Pt is an 78 y/o female admitted secondary to bloody diarrhea. Found to have colitis. PMH includes HTN, CAD s/p CABG, CKD, PVD, and DM.    Clinical Impression   Pt is at Independent - Mod I level with ADLs/selfcare and mobility with no AD, sup for safety. Pt ready to d/c home. All education completed and no further acute OT is indicated at this time    Follow Up Recommendations  No OT follow up;Supervision - Intermittent    Equipment Recommendations  None recommended by OT    Recommendations for Other Services       Precautions / Restrictions Precautions Precautions: Fall Precaution Comments: Reports fall in bathtub 2-3 months ago.  Restrictions Weight Bearing Restrictions: No      Mobility Bed Mobility Overal bed mobility: Needs Assistance Bed Mobility: Supine to Sit;Sit to Supine     Supine to sit: Supervision Sit to supine: Supervision      Transfers Overall transfer level: Independent Equipment used: 1 person hand held assist Transfers: Sit to/from Stand Sit to Stand: Supervision              Balance Overall balance assessment: Needs assistance Sitting-balance support: No upper extremity supported;Feet supported Sitting balance-Leahy Scale: Good     Standing balance support: Single extremity supported;During functional activity Standing balance-Leahy Scale: Fair                             ADL either performed or assessed with clinical judgement   ADL Overall ADL's : Modified independent;Independent                                             Vision Baseline Vision/History: Wears glasses Wears Glasses: Reading only Patient Visual Report: No change from baseline       Perception     Praxis      Pertinent Vitals/Pain       Hand Dominance  Right   Extremity/Trunk Assessment Upper Extremity Assessment Upper Extremity Assessment: Generalized weakness   Lower Extremity Assessment Lower Extremity Assessment: Defer to PT evaluation   Cervical / Trunk Assessment Cervical / Trunk Assessment: Normal   Communication Communication Communication: No difficulties   Cognition Arousal/Alertness: Awake/alert Behavior During Therapy: WFL for tasks assessed/performed Overall Cognitive Status: Within Functional Limits for tasks assessed                                     General Comments       Exercises     Shoulder Instructions      Home Living Family/patient expects to be discharged to:: Private residence Living Arrangements: Alone Available Help at Discharge: Friend(s);Available PRN/intermittently Type of Home: Apartment Home Access: Level entry     Home Layout: One level     Bathroom Shower/Tub: Teacher, early years/pre: Handicapped height     Home Equipment: Environmental consultant - 2 wheels;Walker - 4 wheels;Shower seat          Prior Functioning/Environment Level of Independence: Independent                 OT Problem List:  Decreased activity tolerance      OT Treatment/Interventions:      OT Goals(Current goals can be found in the care plan section) Acute Rehab OT Goals Patient Stated Goal: go home OT Goal Formulation: With patient  OT Frequency:     Barriers to D/C:  none          Co-evaluation              AM-PAC OT "6 Clicks" Daily Activity     Outcome Measure Help from another person eating meals?: None Help from another person taking care of personal grooming?: None Help from another person toileting, which includes using toliet, bedpan, or urinal?: None Help from another person bathing (including washing, rinsing, drying)?: None Help from another person to put on and taking off regular upper body clothing?: None Help from another person to put on and taking off  regular lower body clothing?: None 6 Click Score: 24   End of Session Equipment Utilized During Treatment: Gait belt Nurse Communication: Mobility status  Activity Tolerance: Patient tolerated treatment well Patient left: in bed;with call bell/phone within reach  OT Visit Diagnosis: Muscle weakness (generalized) (M62.81)                Time: 0277-4128 OT Time Calculation (min): 20 min Charges:  OT General Charges $OT Visit: 1 Visit OT Evaluation $OT Eval Low Complexity: 1 Low    Britt Bottom 10/15/2018, 12:04 PM

## 2018-10-15 NOTE — Progress Notes (Signed)
   Subjective: Patient states she is feeling much better.  She states that her nausea and terminal pain have subsided.  She has not had any more diarrhea since yesterday.  Tolerating p.o. intake well.  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  Patient expressed understanding.  Objective:  Vital signs in last 24 hours: Vitals:   10/14/18 1900 10/14/18 2012 10/15/18 0019 10/15/18 0100  BP: (!) 145/58 139/62 (!) 135/47   Pulse: 80 77 66   Resp: 16 14 16    Temp:   98.6 F (37 C)   TempSrc:      SpO2: 95% 96% 96%   Weight:    56.1 kg  Height:    5\' 1"  (1.549 m)   Physical Exam  Constitutional: She is oriented to person, place, and time. No distress.  Neck: Normal range of motion.  Cardiovascular: Normal rate, regular rhythm, normal heart sounds and intact distal pulses. Exam reveals no gallop and no friction rub.  No murmur heard. Pulmonary/Chest: Effort normal and breath sounds normal. No respiratory distress. She has no wheezes.  Abdominal: Soft. Bowel sounds are normal. She exhibits no distension. There is no abdominal tenderness.  Neurological: She is alert and oriented to person, place, and time.  Skin: She is not diaphoretic.     Assessment/Plan:  Principal Problem:   Acute colitis  Principal Problem:   Acute colitis  78yo female with PMH CAD, CKD III, HLD, TIIDM presenting with one day of bloody diarrhea and abdominal pain with colitis on CT.   Acute Colitis Patient's symptoms have resolved.  We will discharge her today with a 5-day course of Augmentin.  We will have the patient follow-up in our outpatient clinic in 1 to 2 weeks.  Patient was informed to call us or return to the ED if her symptoms do not improve or worsen.  DM: Darted back on home medication  HTN: Darted back on home medications  CAD:  Back on home medication  CKDIII: Started back on home medications  VTE: lovenox  IVF: none  Diet: regular  Code: full   Dispo: Anticipated discharge today.  Marianna Payment, MD 10/15/2018, 6:39 AM Pager: [phone]

## 2018-10-15 NOTE — Discharge Summary (Addendum)
Name: [name] MRN: [id number] DOB: July 11, 1940 78 y.o. PCP: [name] Ludwig, MD  Date of Admission: 10/13/2018  5:10 PM Date of Discharge:  Attending Physician: [name] Ochs, MD  Discharge Diagnosis: 1. Infectious Colitis  Discharge Medications: Allergies as of 10/15/2018      Reactions   Valsartan Cough      Medication List    TAKE these medications   Accu-Chek FastClix Lancets Misc Check blood sugar 3 times a day   Accu-Chek Guide Me w/Device Kit 1 each by Does not apply route 3 (three) times daily.   amLODipine 10 MG tablet Commonly known as: NORVASC Take 1 tablet (10 mg total) by mouth daily.   amoxicillin-clavulanate 875-125 MG tablet Commonly known as: Augmentin Take 1 tablet by mouth daily for 5 days.   aspirin EC 81 MG tablet Take 81 mg by mouth daily.   atenolol 25 MG tablet Commonly known as: TENORMIN TAKE 1/2 TABLET(12.5 MG) BY MOUTH DAILY What changed: See the new instructions.   benzonatate 100 MG capsule Commonly known as: TESSALON Take 1-2 capsules (100-200 mg total) by mouth 3 (three) times daily as needed for cough.   CALCIUM 600 + D PO Take 1 tablet by mouth 2 (two) times daily.   clopidogrel 75 MG tablet Commonly known as: PLAVIX TAKE 1 TABLET(75 MG) BY MOUTH DAILY What changed: See the new instructions.   dextromethorphan 15 MG/5ML syrup Take 10 mLs (30 mg total) by mouth 4 (four) times daily as needed for cough.   diclofenac sodium 1 % Gel Commonly known as: VOLTAREN Apply 2 g topically 4 (four) times daily.   famotidine 20 MG tablet Commonly known as: PEPCID TAKE 1 TABLET BY MOUTH TWICE DAILY What changed:   how much to take  how to take this  when to take this  additional instructions   furosemide 40 MG tablet Commonly known as: LASIX TAKE 1 TABLET(40 MG) BY MOUTH DAILY What changed:   how much to take  how to take this  when to take this  additional instructions   glucose blood test  strip Commonly known as: Accu-Chek Guide Check blood sugar 3 times a day   Insulin Glargine 100 UNIT/ML Solostar Pen Commonly known as: Lantus SoloStar Inject 30 Units into the skin at bedtime. What changed: how much to take   Insulin Pen Needle 32G X 4 MM Misc Commonly known as: BD Pen Needle Nano U/F USE FOUR TIMES DAILY AS DIRECTED( BEFORE MEALS AND AT BEDTIME AS DIRECTED)   liraglutide 18 MG/3ML Sopn Commonly known as: Victoza Inject 0.2 mLs (1.2 mg total) into the skin daily.   nitroGLYCERIN 0.3 MG SL tablet Commonly known as: NITROSTAT Place 1 tablet (0.3 mg total) under the tongue every 5 (five) minutes as needed for chest pain.   olmesartan 5 MG tablet Commonly known as: BENICAR Take 1 tablet (5 mg total) by mouth daily.   potassium chloride 10 MEQ tablet Commonly known as: KLOR-CON Take 1 tablet (10 mEq total) by mouth 2 (two) times daily.   rosuvastatin 20 MG tablet Commonly known as: CRESTOR TAKE 1 TABLET(20 MG) BY MOUTH DAILY What changed: See the new instructions.       Disposition and follow-up:   [name] was discharged from [facility] in stable condition.  At the hospital follow up visit please address:  1. Resolution of GI symptoms  2. Labs / imaging needed at time of follow-up:Patients creatinine is at baseline, but  may need a follow up BMP   3.  Pending labs/ test needing follow-up: none  Follow-up Appointments: Hospital follow up appointment with her PCP, Dr. Berline [name] on 11/17/2018   Hospital Course by problem list: 1. Acute Colitis: Patient likely has infectious colitis in the setting of clinical improvement on antibiotic therapy. Patient has improved with IVFs and IV Unasyn. Patient was negative for Clostridium dificile. Patient CT scan showed colitis of the distal and transverse colon. Patient's diarrhea, nausea, and vomiting resolved once starting IV antidotic therapy. She is now tolerating PO intake without  symptoms. Patient was transitions to oral antibiotics consisting of Augmentin for 5 days and to follow up in out outpatient clinic in a couple of weeks.Patient was restarted on home medications.    Discharge Vitals:   BP (!) 131/54 (BP Location: Left Arm)   Pulse 65   Temp 98.2 F (36.8 C)   Resp 18   Ht _0  (1.549 m)   Wt 56.1 kg   SpO2 93%   BMI 23.37 kg/m   Pertinent Labs, Studies, and Procedures:    CT abdomen:  1. Colitis of the distal transverse and descending colon. No bowel obstruction. Normal appendix. 2. Sigmoid diverticulosis. 3. A 14 mm septated cyst or 2 adjacent cystic lesions in the neck of the pancreas. Further evaluation with MRI on a nonemergent basis Recommended.  CBC Latest Ref Rng & Units 10/15/2018 10/14/2018 10/14/2018  WBC 4.0 - 10.5 K/uL 8.5 9.6 10.0  Hemoglobin 12.0 - 15.0 g/dL 11.3(L) 12.7 13.3  Hematocrit 36.0 - 46.0 % 32.7(L) 36.9 37.9  Platelets 150 - 400 K/uL 135(L) 135(L) 151   CMP Latest Ref Rng & Units 10/14/2018 10/13/2018 08/04/2018  Glucose 70 - 99 mg/dL 187(H) 237(H) 303(H)  BUN 8 - 23 mg/dL _1 Creatinine 0.44 - 1.00 mg/dL 1.25(H) 1.37(H) 1.46(H)  Sodium 135 - 145 mmol/L 137 138 137  Potassium 3.5 - 5.1 mmol/L 4.0 3.7 4.6  Chloride 98 - 111 mmol/L 99 98 96  CO2 22 - 32 mmol/L _2 Calcium 8.9 - 10.3 mg/dL 8.1(L) 9.6 8.9  Total Protein 6.5 - 8.1 g/dL - 7.1 -  Total Bilirubin 0.3 - 1.2 mg/dL - 1.0 -  Alkaline Phos 38 - 126 U/L - 62 -  AST 15 - 41 U/L - 20 -  ALT 0 - 44 U/L - 18 -    Discharge Instructions: Discharge Instructions    Diet - low sodium heart healthy   Complete by: As directed    Discharge instructions   Complete by: As directed    You were hospitalized for likely viral gastroenteritis. Thank you for allowing [place] to be part of your care.   We arranged for the Internal Medicine Clinic at [facility] to call you to make a hospital follow up appointment.    Please note these changes made to your  medications:   Please START taking: Please continue all of your home medications at this time   Please make sure to come back if your symptoms do not improve or if they worsen.   Please call our clinic if you have any questions or concerns, we may be able to help and keep you from a long and expensive emergency room wait. Our clinic and after hours phone number is [phone], the best time to call is Monday through Friday 9 am to 4 pm but there is always someone available 24/7 if you have an emergency. If you  need medication refills please notify your pharmacy one week in advance and they will send [place] a request.   Increase activity slowly   Complete by: As directed       Signed: Marianna Payment, MD 10/15/2018, 11:10 AM   Pager: [phone]

## 2018-10-15 NOTE — Progress Notes (Signed)
Discharge instructions given with stated understanding.  Patient waiting for transportation home at this time 

## 2018-10-17 NOTE — Progress Notes (Signed)
Internal Medicine Clinic Attending  Case discussed with Dr. Winfrey  at the time of the visit.  We reviewed the resident's history and exam and pertinent patient test results.  I agree with the assessment, diagnosis, and plan of care documented in the resident's note.  

## 2018-10-19 LAB — GI PATHOGEN PANEL BY PCR, STOOL

## 2018-11-01 ENCOUNTER — Other Ambulatory Visit: Payer: Self-pay | Admitting: Internal Medicine

## 2018-11-01 DIAGNOSIS — I10 Essential (primary) hypertension: Secondary | ICD-10-CM

## 2018-11-03 ENCOUNTER — Encounter: Payer: Medicare Other | Admitting: Internal Medicine

## 2018-11-08 ENCOUNTER — Other Ambulatory Visit: Payer: Self-pay | Admitting: Internal Medicine

## 2018-11-08 DIAGNOSIS — I10 Essential (primary) hypertension: Secondary | ICD-10-CM

## 2018-11-17 ENCOUNTER — Other Ambulatory Visit: Payer: Self-pay

## 2018-11-17 ENCOUNTER — Encounter: Payer: Self-pay | Admitting: Internal Medicine

## 2018-11-17 ENCOUNTER — Ambulatory Visit (INDEPENDENT_AMBULATORY_CARE_PROVIDER_SITE_OTHER): Payer: Medicare Other | Admitting: Internal Medicine

## 2018-11-17 VITALS — BP 123/53 | HR 78 | Temp 98.2°F | Ht 60.0 in | Wt 122.2 lb

## 2018-11-17 DIAGNOSIS — E1122 Type 2 diabetes mellitus with diabetic chronic kidney disease: Secondary | ICD-10-CM | POA: Diagnosis not present

## 2018-11-17 DIAGNOSIS — Z9111 Patient's noncompliance with dietary regimen: Secondary | ICD-10-CM

## 2018-11-17 DIAGNOSIS — Z79899 Other long term (current) drug therapy: Secondary | ICD-10-CM

## 2018-11-17 DIAGNOSIS — I739 Peripheral vascular disease, unspecified: Secondary | ICD-10-CM

## 2018-11-17 DIAGNOSIS — I130 Hypertensive heart and chronic kidney disease with heart failure and stage 1 through stage 4 chronic kidney disease, or unspecified chronic kidney disease: Secondary | ICD-10-CM

## 2018-11-17 DIAGNOSIS — N183 Chronic kidney disease, stage 3 unspecified: Secondary | ICD-10-CM | POA: Diagnosis not present

## 2018-11-17 DIAGNOSIS — Z8719 Personal history of other diseases of the digestive system: Secondary | ICD-10-CM

## 2018-11-17 DIAGNOSIS — Z23 Encounter for immunization: Secondary | ICD-10-CM | POA: Diagnosis not present

## 2018-11-17 DIAGNOSIS — I5030 Unspecified diastolic (congestive) heart failure: Secondary | ICD-10-CM | POA: Diagnosis not present

## 2018-11-17 DIAGNOSIS — Z794 Long term (current) use of insulin: Secondary | ICD-10-CM | POA: Diagnosis not present

## 2018-11-17 DIAGNOSIS — E1121 Type 2 diabetes mellitus with diabetic nephropathy: Secondary | ICD-10-CM

## 2018-11-17 DIAGNOSIS — K219 Gastro-esophageal reflux disease without esophagitis: Secondary | ICD-10-CM

## 2018-11-17 DIAGNOSIS — I10 Essential (primary) hypertension: Secondary | ICD-10-CM

## 2018-11-17 LAB — POCT GLYCOSYLATED HEMOGLOBIN (HGB A1C): Hemoglobin A1C: 8.2 % — AB (ref 4.0–5.6)

## 2018-11-17 LAB — GLUCOSE, CAPILLARY: Glucose-Capillary: 196 mg/dL — ABNORMAL HIGH (ref 70–99)

## 2018-11-17 NOTE — Progress Notes (Signed)
   CC: HTN, DMII  HPI:[name] is a 78 y.o. female who presents for evaluation of HTN and DMII. Please see individual problem based A/P for details.  Change all scripts to [place] today per patient's request.  Enteropathogenic E. Coli infection: The patient has recovered from her recent diarrheal illness.  She said no recent episodes of bloody diarrhea.  She completed her antibiotic course.  No additional treatment recommended.  Past Medical History:  Diagnosis Date  . CAD (coronary artery disease) 2006   s/p Quadrupal CABG 2006  . CHF (congestive heart failure) ([place]) 2010   EF 30-35% from Echo 06/2008  . CKD (chronic kidney disease) stage 4, GFR 15-29 ml/min (HCC)    fluctuating between stage 3 and 4 depending on GFR  . CKD (chronic kidney disease), stage III 09/27/2013  . De Quervain's tenosynovitis, left 11/13/2015  . Diverticulosis   . DIVERTICULOSIS OF COLON 04/01/2007   Qualifier: Diagnosis of  By: Nelson-Smith CMA (AAMA), [name]    . 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 Dyslipidemia 10/15/2005   Qualifier: Diagnosis of  By: Derrel Nip MD, Helene Kelp    . History of colonic polyps   . HLD (hyperlipidemia)   . HTN (hypertension)   . Lumbar spinal stenosis   . MI (myocardial infarction) ([place]) 2003   . [place] [employer]  . Mouth ulcer adjacent to jaw bone 08/22/2010   S/p removal by ENT 09/2010   . MYOCARDIAL INFARCTION, HX OF 09/09/2006   Qualifier: Diagnosis of  By: Marinda Elk MD, Sonia Side    . Nephrolithiasis    hx of  . NEPHROLITHIASIS 04/01/2007   Qualifier: Diagnosis of  By: Harlon Ditty CMA (AAMA), [name]    . OA (osteoarthritis)   . Osteopenia 2010   T score of -1.8  . Psoriasis   . PVD (peripheral vascular disease) (HCC)    ABI indicated moderated reduction arterial flow on the left.  . Tricuspid regurgitation 2009   moderate to severe, EF 55%   Review of Systems:  ROS negative except as per HPI.  Physical Exam: Vitals:   11/17/18 1313   BP: (!) 123/53  Pulse: 78  Temp: 98.2 F (36.8 C)  TempSrc: Oral  SpO2: 97%   General: A/O x4, in no acute distress, afebrile, nondiaphoretic HEENT: PEERL, EMO intact Cardio: RRR, no mrg's  Pulmonary: CTA bilaterally, no wheezing or crackles  Abdomen: Bowel sounds normal, soft, nontender  MSK: BLE nontender, nonedematous Psych: Appropriate affect, not depressed in appearance, engages well  Assessment & Plan:   See Encounters Tab for problem based charting.  Patient discussed with Dr. Lynnae January

## 2018-11-17 NOTE — Patient Instructions (Signed)
FOLLOW-UP INSTRUCTIONS When: 3-4 months For: Routine visit What to bring: All of your medications  I have Increased your dose of Lantus to 35U nightly. Please continue taking the same dose of all of your other medications.   Please continue to avoid large crowds or gatherings and to wear a mask to stay health.   Thank you for your visit to the [facility] today. If you have any questions or concerns please call us at [phone].

## 2018-11-18 ENCOUNTER — Encounter: Payer: Self-pay | Admitting: Internal Medicine

## 2018-11-18 DIAGNOSIS — Z23 Encounter for immunization: Secondary | ICD-10-CM | POA: Insufficient documentation

## 2018-11-18 MED ORDER — LANTUS SOLOSTAR 100 UNIT/ML ~~LOC~~ SOPN: 35.0000 [IU] | PEN_INJECTOR | Freq: Every day | SUBCUTANEOUS | 5 refills | Status: DC

## 2018-11-18 MED ORDER — ACCU-CHEK FASTCLIX LANCETS MISC: 3 refills | Status: DC

## 2018-11-18 MED ORDER — FUROSEMIDE 40 MG PO TABS: 40.0000 mg | ORAL_TABLET | Freq: Every day | ORAL | 3 refills | Status: DC

## 2018-11-18 MED ORDER — FAMOTIDINE 20 MG PO TABS: 20.0000 mg | ORAL_TABLET | Freq: Two times a day (BID) | ORAL | 3 refills | Status: DC

## 2018-11-18 MED ORDER — CLOPIDOGREL BISULFATE 75 MG PO TABS: ORAL_TABLET | ORAL | 3 refills | Status: DC

## 2018-11-18 MED ORDER — CALCIUM CARBONATE-VITAMIN D 600-400 MG-UNIT PO TABS: 1.0000 | ORAL_TABLET | Freq: Every day | ORAL | 1 refills | Status: DC

## 2018-11-18 MED ORDER — OLMESARTAN MEDOXOMIL 5 MG PO TABS: 5.0000 mg | ORAL_TABLET | Freq: Every day | ORAL | 3 refills | Status: DC

## 2018-11-18 MED ORDER — AMLODIPINE BESYLATE 10 MG PO TABS: ORAL_TABLET | ORAL | 3 refills | Status: DC

## 2018-11-18 MED ORDER — ASPIRIN EC 81 MG PO TBEC: 81.0000 mg | DELAYED_RELEASE_TABLET | Freq: Every day | ORAL | 3 refills | Status: AC

## 2018-11-18 NOTE — Assessment & Plan Note (Signed)
Hypertension: Patient's BP today is 123/53 with a goal of <140/80. The patient endorses adherence to her medication regimen. She denied, chest pain, headache, visual changes, lightheadedness, weakness, dizziness on standing, swelling in the feet or ankles.  As previously discussed she had discontinue atenolol.  She will need to continue this given her mild diastolic heart failure and hypertension.  Please recent BMP as below: BMP Latest Ref Rng & Units 10/14/2018 10/13/2018 08/04/2018  Glucose 70 - 99 mg/dL 187(H) 237(H) 303(H)  BUN 8 - 23 mg/dL 17 19 22   Creatinine 0.44 - 1.00 mg/dL 1.25(H) 1.37(H) 1.46(H)  BUN/Creat Ratio 12 - 28 - - 15  Sodium 135 - 145 mmol/L 137 138 137  Potassium 3.5 - 5.1 mmol/L 4.0 3.7 4.6  Chloride 98 - 111 mmol/L 99 98 96  CO2 22 - 32 mmol/L 28 28 23   Calcium 8.9 - 10.3 mg/dL 8.1(L) 9.6 8.9   Plan: Continue olmesartan 5 mg daily Amlodipine 10 mg daily Repeat BMP in next visit visit

## 2018-11-18 NOTE — Assessment & Plan Note (Signed)
GERD: Stable.  Continue famotidine.

## 2018-11-18 NOTE — Assessment & Plan Note (Signed)
  Influenza vaccination: Patient is agreeable to an appropriate for the influenza vaccination.  Plan:  Vaccination administered

## 2018-11-18 NOTE — Assessment & Plan Note (Signed)
DMII: Hgb A1c 7.8 % at the last. Current A1c 8.2%. The patient denied polyuria, polydipsia, headache, fatigue, confusion, nausea, vomiting or diaphoresis. She endorses close adherence to her medication regimen but has noted some dietary indiscretions that may have lead to the increase. Given the multiple medication dose adjustments over the past year as well has the relative stability in her A1c I will make only a minor adjustment given the high likelihood that her diet will further change over the upcoming holidays.   Plan: Continue Lantus increased to 35U daily Continue Liraglutide Repeat A1c at next visit

## 2018-11-19 NOTE — Progress Notes (Signed)
Internal Medicine Clinic Attending  Case discussed with Dr. Harbrecht at the time of the visit.  We reviewed the resident's history and exam and pertinent patient test results.  I agree with the assessment, diagnosis, and plan of care documented in the resident's note.   

## 2018-11-30 ENCOUNTER — Other Ambulatory Visit: Payer: Self-pay | Admitting: Internal Medicine

## 2019-01-18 ENCOUNTER — Other Ambulatory Visit: Payer: Self-pay | Admitting: Internal Medicine

## 2019-01-18 DIAGNOSIS — Z1231 Encounter for screening mammogram for malignant neoplasm of breast: Secondary | ICD-10-CM

## 2019-01-20 LAB — HM DIABETES EYE EXAM

## 2019-01-21 ENCOUNTER — Encounter: Payer: Self-pay | Admitting: *Deleted

## 2019-01-26 ENCOUNTER — Other Ambulatory Visit: Payer: Self-pay | Admitting: Internal Medicine

## 2019-01-26 DIAGNOSIS — Z951 Presence of aortocoronary bypass graft: Secondary | ICD-10-CM

## 2019-01-26 DIAGNOSIS — E118 Type 2 diabetes mellitus with unspecified complications: Secondary | ICD-10-CM

## 2019-01-26 DIAGNOSIS — E785 Hyperlipidemia, unspecified: Secondary | ICD-10-CM

## 2019-02-28 ENCOUNTER — Ambulatory Visit: Payer: Medicare Other

## 2019-03-04 ENCOUNTER — Other Ambulatory Visit: Payer: Self-pay | Admitting: Internal Medicine

## 2019-03-04 DIAGNOSIS — Z794 Long term (current) use of insulin: Secondary | ICD-10-CM

## 2019-03-16 ENCOUNTER — Ambulatory Visit (INDEPENDENT_AMBULATORY_CARE_PROVIDER_SITE_OTHER): Payer: Medicare Other | Admitting: Internal Medicine

## 2019-03-16 VITALS — BP 134/60 | HR 77 | Temp 98.6°F | Ht 61.0 in | Wt 115.0 lb

## 2019-03-16 DIAGNOSIS — E1122 Type 2 diabetes mellitus with diabetic chronic kidney disease: Secondary | ICD-10-CM

## 2019-03-16 DIAGNOSIS — N1831 Chronic kidney disease, stage 3a: Secondary | ICD-10-CM

## 2019-03-16 DIAGNOSIS — I10 Essential (primary) hypertension: Secondary | ICD-10-CM | POA: Diagnosis not present

## 2019-03-16 DIAGNOSIS — E1121 Type 2 diabetes mellitus with diabetic nephropathy: Secondary | ICD-10-CM

## 2019-03-16 DIAGNOSIS — N183 Chronic kidney disease, stage 3 unspecified: Secondary | ICD-10-CM | POA: Diagnosis not present

## 2019-03-16 DIAGNOSIS — I5030 Unspecified diastolic (congestive) heart failure: Secondary | ICD-10-CM | POA: Diagnosis not present

## 2019-03-16 DIAGNOSIS — I130 Hypertensive heart and chronic kidney disease with heart failure and stage 1 through stage 4 chronic kidney disease, or unspecified chronic kidney disease: Secondary | ICD-10-CM

## 2019-03-16 DIAGNOSIS — Z794 Long term (current) use of insulin: Secondary | ICD-10-CM

## 2019-03-16 DIAGNOSIS — Z79899 Other long term (current) drug therapy: Secondary | ICD-10-CM

## 2019-03-16 DIAGNOSIS — R634 Abnormal weight loss: Secondary | ICD-10-CM

## 2019-03-16 DIAGNOSIS — Z86010 Personal history of colonic polyps: Secondary | ICD-10-CM

## 2019-03-16 DIAGNOSIS — I5032 Chronic diastolic (congestive) heart failure: Secondary | ICD-10-CM

## 2019-03-16 DIAGNOSIS — Z6821 Body mass index (BMI) 21.0-21.9, adult: Secondary | ICD-10-CM

## 2019-03-16 LAB — POCT GLYCOSYLATED HEMOGLOBIN (HGB A1C): Hemoglobin A1C: 7.6 % — AB (ref 4.0–5.6)

## 2019-03-16 LAB — GLUCOSE, CAPILLARY: Glucose-Capillary: 176 mg/dL — ABNORMAL HIGH (ref 70–99)

## 2019-03-16 MED ORDER — OLMESARTAN MEDOXOMIL 5 MG PO TABS: 5.0000 mg | ORAL_TABLET | Freq: Every day | ORAL | 3 refills | Status: DC

## 2019-03-16 MED ORDER — FUROSEMIDE 20 MG PO TABS: 20.0000 mg | ORAL_TABLET | Freq: Every day | ORAL | 3 refills | Status: DC

## 2019-03-16 NOTE — Patient Instructions (Addendum)
FOLLOW-UP INSTRUCTIONS When: 2 months For: Routine evaluation What to bring: All of your medications  We will not make any changes to your blood pressure regimen today.   Please cut the lasix back to 20mg  daily and stop taking the potassium daily. Please take the potassium every other day.   For your weight loss, I will look into the results of the colonoscopy you had in 2008 and determine if you need a follow-up. We will need to stop your Victoza and start an alternative. We will likely need to get a CT of the chest, abdomin and pelvis to evaluate this but I will contact you if indicated.  I will let you know the results of your lab tests from today.   Thank you for your visit to the [name] today. If you have any questions or concerns please call us at [phone].

## 2019-03-16 NOTE — Progress Notes (Signed)
   CC: HTN and DMII  HPI:[name] is a 79 y.o. female who presents for evaluation of HTN and DMII. Please see individual problem based A/P for details.  Past Medical History:  Diagnosis Date  . Acute colitis 10/14/2018  . Bloody diarrhea 10/13/2018  . CAD (coronary artery disease) 2006   s/p Quadrupal CABG 2006  . CHF (congestive heart failure) ([place]) 2010   EF 30-35% from Echo 06/2008  . CKD (chronic kidney disease) stage 4, GFR 15-29 ml/min (HCC)    fluctuating between stage 3 and 4 depending on GFR  . CKD (chronic kidney disease), stage III 09/27/2013  . De Quervain's tenosynovitis, left 11/13/2015  . Diverticulosis   . DIVERTICULOSIS OF COLON 04/01/2007   Qualifier: Diagnosis of  By: Nelson-Smith CMA (AAMA), [name]    . DM (diabetes mellitus) ([place])    insulin dependent  . Dyslipidemia 10/15/2005   Qualifier: Diagnosis of  By: Derrel Nip MD, Helene Kelp    . History of colonic polyps   . HLD (hyperlipidemia)   . HTN (hypertension)   . Lumbar spinal stenosis   . MI (myocardial infarction) ([place]) 2003   . [place]  . Mouth ulcer adjacent to jaw bone 08/22/2010   S/p removal by ENT 09/2010   . MYOCARDIAL INFARCTION, HX OF 09/09/2006   Qualifier: Diagnosis of  By: Marinda Elk MD, Sonia Side    . Nephrolithiasis    hx of  . NEPHROLITHIASIS 04/01/2007   Qualifier: Diagnosis of  By: Harlon Ditty CMA (AAMA), [name]    . OA (osteoarthritis)   . Osteopenia 2010   T score of -1.8  . Psoriasis   . PVD (peripheral vascular disease) (HCC)    ABI indicated moderated reduction arterial flow on the left.  . Tricuspid regurgitation 2009   moderate to severe, EF 55%   Review of Systems:  ROS negative except as per HPI.  Physical Exam: Vitals:   03/16/19 1322  BP: 134/60  Pulse: 77  Temp: 98.6 F (37 C)  TempSrc: Oral  SpO2: 98%  Weight: 115 lb (52.2 kg)  Height: 5\' 1"  (1.549 m)   Filed Weights   03/16/19 1322  Weight: 115 lb (52.2 kg)   General: A/O x4, in no acute distress,  afebrile, nondiaphoretic HEENT: PEERL, EMO intact Cardio: RRR, no mrg's  Pulmonary: CTA bilaterally, no wheezing or crackles  Abdomen: Bowel sounds normal, soft, nontender  MSK: BLE nontender, nonedematous Neuro: Alert, CNII-XII grossly intact, conversational, strength 5/5 in the upper and lower extremities bilaterally, normal gait Psych: Appropriate affect, not depressed in appearance, engages well  Assessment & Plan:   See Encounters Tab for problem based charting.  Patient discussed with Dr. Dareen Piano

## 2019-03-17 ENCOUNTER — Telehealth: Payer: Self-pay | Admitting: *Deleted

## 2019-03-17 ENCOUNTER — Encounter: Payer: Self-pay | Admitting: Internal Medicine

## 2019-03-17 DIAGNOSIS — R634 Abnormal weight loss: Secondary | ICD-10-CM | POA: Insufficient documentation

## 2019-03-17 DIAGNOSIS — Z87898 Personal history of other specified conditions: Secondary | ICD-10-CM | POA: Insufficient documentation

## 2019-03-17 LAB — BMP8+ANION GAP
Anion Gap: 17 mmol/L (ref 10.0–18.0)
BUN/Creatinine Ratio: 12 (ref 12–28)
BUN: 16 mg/dL (ref 8–27)
CO2: 27 mmol/L (ref 20–29)
Calcium: 10.7 mg/dL — ABNORMAL HIGH (ref 8.7–10.3)
Chloride: 96 mmol/L (ref 96–106)
Creatinine, Ser: 1.29 mg/dL — ABNORMAL HIGH (ref 0.57–1.00)
GFR calc Af Amer: 45 mL/min/{1.73_m2} — ABNORMAL LOW (ref >59)
GFR calc non Af Amer: 39 mL/min/{1.73_m2} — ABNORMAL LOW (ref >59)
Glucose: 177 mg/dL — ABNORMAL HIGH (ref 65–99)
Potassium: 4.3 mmol/L (ref 3.5–5.2)
Sodium: 140 mmol/L (ref 134–144)

## 2019-03-17 NOTE — Telephone Encounter (Signed)
Dr. Carlean Purl be advised: PCP called to get the patient scheduled to see Dr. Carlean Purl in the next two weeks for 30 lbs weight loss over the last few months. Patient scheduled for OV 3/12 at 11:30 am. Called patient and left a VM for the patient to call back to confirm visit. Will attempt to call later today to confirm appointment.

## 2019-03-17 NOTE — Assessment & Plan Note (Signed)
CHF: There is some concern for muscle cramps and dry skin. Exam revealed no edea, decreased skin turgor and good peripheral pulses in the feet.  Plan: Decreased lasix to 20mg  daily Decreased potassium supplementation to 100mEq every other day

## 2019-03-17 NOTE — Assessment & Plan Note (Signed)
Hypertension: Patient's BP today is 134/60 with a goal of <140/80. The patient endorses adherence to her medication regimen. She denied, chest pain, headache, visual changes, lightheadedness, weakness, dizziness on standing, swelling in the feet or ankles. Last sCr 1.25 on 10/14/18 with K+ 4.0.    Plan: Continue amlodipine 10mg  daily BMP today.

## 2019-03-17 NOTE — Assessment & Plan Note (Addendum)
CKDIIIa: Likely 2/2 to DMII and HTN. Last sCr 1.25 on 10/14/18 with K+ 4.0. Stable. Consider nephrology consult if this progresses rapidly or past CKDIIIb. Repeat BMP with stable renal function. BMP Latest Ref Rng & Units 03/16/2019 10/14/2018 10/13/2018  Glucose 65 - 99 mg/dL 177(H) 187(H) 237(H)  BUN 8 - 27 mg/dL 16 17 19   Creatinine 0.57 - 1.00 mg/dL 1.29(H) 1.25(H) 1.37(H)  BUN/Creat Ratio 12 - 28 12 - -  Sodium 134 - 144 mmol/L 140 137 138  Potassium 3.5 - 5.2 mmol/L 4.3 4.0 3.7  Chloride 96 - 106 mmol/L 96 99 98  CO2 20 - 29 mmol/L 27 28 28   Calcium 8.7 - 10.3 mg/dL 10.7(H) 8.1(L) 9.6   Plan: Continue monitoring

## 2019-03-17 NOTE — Progress Notes (Signed)
Internal Medicine Clinic Attending  Case discussed with Dr. Harbrecht at the time of the visit.  We reviewed the resident's history and exam and pertinent patient test results.  I agree with the assessment, diagnosis, and plan of care documented in the resident's note.   

## 2019-03-17 NOTE — Assessment & Plan Note (Signed)
Weight loss: The patient noted weight loss which I can confirm is more drastic over the past 5 months but was beginning to decline before. She was ~140 for several years until around 10/29/2017 when her weight was recorded as 134. She then trended down slowly to a low of 115 today over the 18 months since that time. This ~20-25lb weight loss seems to exceed what would be expected from a low dose GLP-1 which was started at about that time.  Per chart review she had a colonoscopy in 2008 with multiple polyps noted. However, they were listed as being ~19mm at the maximum with at least 3 tubular adenomatous polyps. A follow-up has not been completed likely in part due to the patients apprehension with repeating a colonoscopy. In light of her weight loss we feel it is best for her to contact GI and schedule and appointment to discuss the need for repeat colonoscopy.  She denied loose stool. Has normal bowel movements daily that are nonbloody and not pencil thin.  I do feel that her weight loss is most likely related to her GLP-1 as she began to lose the weight when this was started and seeing that she endorsed a markedly decreased appetite while on this therapy. We do need to make certain though that nothing more ominous is being overlooked.  She had a recent CT ABD/plevis w contrast on 09/16/19 which denoted a small 1cm subcapsular hypodense lesion in the anterior liver with follow-up recommended. We discussed a CT of the chest and repeat ABD/pelvis with contrast with the patient. As she has prompt follow-up with GI we will wait for this evaluation prior to proceeding per her request.   Plan: She has an appointment with GI on March 16th Consider CT Chest after consultation with GI Stop GLP-1 Continue lantus

## 2019-03-17 NOTE — Assessment & Plan Note (Signed)
DMII: Hgb A1c 8.2 % at the last visit. Current A1c 7.6% indicating that we are at goal. The patient denied polyuria, polydipsia, headache, fatigue, confusion, nausea, vomiting or diaphoresis. She does however endorse weight loss, see weight loss from same day. Creatinine clearance is 28 contradicting use of metformin and SGLT2i. We will need to hold the GLP-1 for now. Patient updated.  Plan: Continue lantus 35U daily Discontinue Victoza 0.6mg  daily Monitor CBGs BID Repeat A1c at next visit

## 2019-03-18 NOTE — Telephone Encounter (Signed)
Confirmed appointment with patient.

## 2019-03-25 ENCOUNTER — Ambulatory Visit (INDEPENDENT_AMBULATORY_CARE_PROVIDER_SITE_OTHER): Payer: Medicare Other | Admitting: Internal Medicine

## 2019-03-25 ENCOUNTER — Encounter: Payer: Self-pay | Admitting: Internal Medicine

## 2019-03-25 ENCOUNTER — Other Ambulatory Visit: Payer: Self-pay | Admitting: Internal Medicine

## 2019-03-25 ENCOUNTER — Other Ambulatory Visit: Payer: Self-pay

## 2019-03-25 ENCOUNTER — Other Ambulatory Visit (INDEPENDENT_AMBULATORY_CARE_PROVIDER_SITE_OTHER): Payer: Medicare Other

## 2019-03-25 VITALS — BP 128/58 | HR 80 | Temp 98.2°F | Ht 59.0 in | Wt 117.4 lb

## 2019-03-25 DIAGNOSIS — R634 Abnormal weight loss: Secondary | ICD-10-CM

## 2019-03-25 DIAGNOSIS — K869 Disease of pancreas, unspecified: Secondary | ICD-10-CM

## 2019-03-25 DIAGNOSIS — I700 Atherosclerosis of aorta: Secondary | ICD-10-CM | POA: Diagnosis not present

## 2019-03-25 DIAGNOSIS — D696 Thrombocytopenia, unspecified: Secondary | ICD-10-CM | POA: Diagnosis not present

## 2019-03-25 DIAGNOSIS — I10 Essential (primary) hypertension: Secondary | ICD-10-CM

## 2019-03-25 LAB — COMPREHENSIVE METABOLIC PANEL
ALT: 15 U/L (ref 0–35)
AST: 14 U/L (ref 0–37)
Albumin: 4.2 g/dL (ref 3.5–5.2)
Alkaline Phosphatase: 62 U/L (ref 39–117)
BUN: 12 mg/dL (ref 6–23)
CO2: 32 mEq/L (ref 19–32)
Calcium: 10.1 mg/dL (ref 8.4–10.5)
Chloride: 97 mEq/L (ref 96–112)
Creatinine, Ser: 1.04 mg/dL (ref 0.40–1.20)
GFR: 50.84 mL/min — ABNORMAL LOW (ref >60.00)
Glucose, Bld: 268 mg/dL — ABNORMAL HIGH (ref 70–99)
Potassium: 3.7 mEq/L (ref 3.5–5.1)
Sodium: 136 mEq/L (ref 135–145)
Total Bilirubin: 0.6 mg/dL (ref 0.2–1.2)
Total Protein: 7.2 g/dL (ref 6.0–8.3)

## 2019-03-25 LAB — PHOSPHORUS: Phosphorus: 2.9 mg/dL (ref 2.3–4.6)

## 2019-03-25 NOTE — Patient Instructions (Signed)
  You have been scheduled for an MRI/MRCP at ______________ on ________________. Your appointment time is ________________________. Please arrive 15 minutes prior to your appointment time for registration purposes. Please make certain not to have anything to eat or drink 6 hours prior to your test. In addition, if you have any metal in your body, have a pacemaker or defibrillator, please be sure to let your ordering physician know. This test typically takes 45 minutes to 1 hour to complete. Should you need to reschedule, please call [phone] to do so.    Your provider has requested that you go to the basement level for lab work before leaving today. Press "B" on the elevator. The lab is located at the first door on the left as you exit the elevator.    I appreciate the opportunity to care for you. Silvano Rusk, MD, [facility]

## 2019-03-25 NOTE — Progress Notes (Signed)
[name] 79 y.o. Nov 02, 1940 [id number]  Assessment & Plan:   Encounter Diagnoses  Name Primary?  . Pancreatic lesion Yes  . Loss of weight   . Thrombocytopenia ([place])   . Abdominal aortic atherosclerosis ([place])   . Hypercalcemia    Because of all this is not clear.  She has hypercalcemia of unclear etiology and pancreatic lesions that could represent a malignancy.  There is also a tiny liver lesion.  Evaluate as below.  Though she has three-vessel mesenteric atherosclerosis she does not have symptoms compatible with mesenteric ischemia. Orders Placed This Encounter  Procedures  . MR ABDOMEN MRCP W WO CONTAST  . PTH, Intact and Calcium  . Calcium, ionized  . Protein electrophoresis, serum  . Comprehensive metabolic panel  . Phosphorus    CC: [name] Ludwig, MD    Subjective:   Chief Complaint: Weight loss  HPI This is an 79 year old white woman with unintentional 30 pound weight loss over several months.  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  At that point she had a CT scan on October 14, 2018, with an atrophic pancreas, 14 mm septated cyst or 2 adjacent cystic lesions in the neck of the pancreas not well characterized.  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  MRI on a nonemergent basis was recommended.  There was also a tiny liver lesion.  Her appetite is off.  She also has atherosclerosis of the aorta and mesenteric branches with suspected stenosis of the SMA but she has no postprandial pain or sitophobia.  Chronic kidney disease is noted.  Hemoglobin A1c is not normal but was 7.69 days ago.  Also has calcium level 10.7.  No albumin with that.  Remotely known to me from colonoscopy in 2008.  No bowel habit changes.  3 subcentimeter adenomas then.  I will says she has been constipated recently but that resolved with changes in her psoriasis medications.  She also has mild thrombocytopenia. Allergies  Allergen Reactions  .  Valsartan Cough   Current Meds  Medication Sig  . amLODipine (NORVASC) 10 MG tablet TAKE 1 TABLET(10 MG) BY MOUTH DAILY  . aspirin EC 81 MG tablet Take 1 tablet (81 mg total) by mouth daily.  . Blood Glucose Monitoring Suppl (ACCU-CHEK GUIDE ME) w/Device KIT 1 each by Does not apply route 3 (three) times daily.  . Calcium Carbonate-Vitamin D (CALCIUM 600+D) 600-400 MG-UNIT tablet Take 1 tablet by mouth daily.  . clopidogrel (PLAVIX) 75 MG tablet TAKE 1 TABLET(75 MG) BY MOUTH DAILY  . famotidine (PEPCID) 20 MG tablet Take 1 tablet (20 mg total) by mouth 2 (two) times daily.  . furosemide (LASIX) 20 MG tablet Take 1 tablet (20 mg total) by mouth daily.  Marland Kitchen glucose blood (ACCU-CHEK GUIDE) test strip Check blood sugar 3 times a day  . Insulin Glargine (LANTUS SOLOSTAR) 100 UNIT/ML Solostar Pen Inject 35 Units into the skin at bedtime.  . Insulin Pen Needle (BD PEN NEEDLE NANO U/F) 32G X 4 MM MISC USE 2 TIMES DAILY AS DIRECTED  . Lancets (FREESTYLE) lancets USE TO CHECK BLOOD SUGAR FOUR TIMES DAILY- BEFORE MEALS AND EVERY NIGHT AT BEDTIME  . nitroGLYCERIN (NITROSTAT) 0.3 MG SL tablet Place 1 tablet (0.3 mg total) under the tongue every 5 (five) minutes as needed for chest pain.  Marland Kitchen OTEZLA 30 MG TABS Take 1 tablet by mouth 2 (two) times daily.  . rosuvastatin (CRESTOR) 20 MG tablet Take 1 tablet (20 mg total)  by mouth daily.   Past Medical History:  Diagnosis Date  . Acute colitis 10/14/2018  . Bloody diarrhea 10/13/2018  . CAD (coronary artery disease) 2006   s/p Quadrupal CABG 2006  . CHF (congestive heart failure) ([place]) 2010   EF 30-35% from Echo 06/2008  . CKD (chronic kidney disease) stage 4, GFR 15-29 ml/min (HCC)    fluctuating between stage 3 and 4 depending on GFR  . CKD (chronic kidney disease), stage III 09/27/2013  . De Quervain's tenosynovitis, left 11/13/2015  . Diverticulosis   . DIVERTICULOSIS OF COLON 04/01/2007   Qualifier: Diagnosis of  By: Nelson-Smith CMA (AAMA), [name]      . DM (diabetes mellitus) ([place])    insulin dependent  . Dyslipidemia 10/15/2005   Qualifier: Diagnosis of  By: Derrel Nip MD, Helene Kelp    . History of colonic polyps   . HLD (hyperlipidemia)   . HTN (hypertension)   . Lumbar spinal stenosis   . MI (myocardial infarction) ([place]) 2003   . [place]  . Mouth ulcer adjacent to jaw bone 08/22/2010   S/p removal by ENT 09/2010   . MYOCARDIAL INFARCTION, HX OF 09/09/2006   Qualifier: Diagnosis of  By: Marinda Elk MD, Sonia Side    . Nephrolithiasis    hx of  . NEPHROLITHIASIS 04/01/2007   Qualifier: Diagnosis of  By: Harlon Ditty CMA (AAMA), [name]    . OA (osteoarthritis)   . Osteopenia 2010   T score of -1.8  . Psoriasis   . PVD (peripheral vascular disease) (HCC)    ABI indicated moderated reduction arterial flow on the left.  . Tricuspid regurgitation 2009   moderate to severe, EF 55%   Past Surgical History:  Procedure Laterality Date  . CATARACT EXTRACTION  2008   left eye  . COLONOSCOPY  2008  . CORONARY ANGIOPLASTY WITH STENT PLACEMENT  2003  . CORONARY ARTERY BYPASS GRAFT  2006   4 vessel  . LAPAROSCOPIC SUPRACERVICAL HYSTERECTOMY    . PERIPHERAL VASCULAR CATHETERIZATION N/A 07/03/2014   Procedure: Abdominal Aortogram w/Lower Extremity;  Surgeon: Angelia Mould, MD;  Location: [facility];  Service: Cardiovascular;  Laterality: N/A;   Social History   Social History Narrative   Retired Event organiser   Widow/Widower   lives in [place]   4 kids (2 sons, 2 daughters) one son died many years ago in car accident.  other kids are all in [place].   Has friend that helps take care of her   family history includes Cirrhosis in her brother; Heart attack in her brother; Heart disease in her brother; Hyperlipidemia in her mother and son; Hypertension in her mother and son.   Review of Systems As per HPI  Objective:   Physical Exam BP (!) 128/58 (BP Location: Left Arm, Patient Position: Sitting, Cuff Size:  Normal)   Pulse 80   Temp 98.2 F (36.8 C)   Ht 4' 11"  (1.499 m) Comment: height measured without shoes  Wt 117 lb 6 oz (53.2 kg)   BMI 23.71 kg/m  NAD Anicteric Lungs cta Cor NL abd loose skin  Soft and NT no mass No cervical/Lena la Alert and oriented x 3 Spry  Data reviewed  As per [facility] CT w/ image review by me PCP notes

## 2019-03-28 LAB — PROTEIN ELECTROPHORESIS, SERUM
Albumin ELP: 4.1 g/dL (ref 3.8–4.8)
Alpha 1: 0.3 g/dL (ref 0.2–0.3)
Alpha 2: 0.9 g/dL (ref 0.5–0.9)
Beta 2: 0.4 g/dL (ref 0.2–0.5)
Beta Globulin: 0.4 g/dL (ref 0.4–0.6)
Gamma Globulin: 0.7 g/dL — ABNORMAL LOW (ref 0.8–1.7)
Total Protein: 6.8 g/dL (ref 6.1–8.1)

## 2019-03-28 LAB — PTH, INTACT AND CALCIUM
Calcium: 10.4 mg/dL (ref 8.6–10.4)
PTH: 25 pg/mL (ref 14–64)

## 2019-03-28 LAB — CALCIUM, IONIZED: Calcium, Ion: 5.17 mg/dL (ref 4.8–5.6)

## 2019-04-03 ENCOUNTER — Other Ambulatory Visit: Payer: Self-pay | Admitting: Internal Medicine

## 2019-04-03 DIAGNOSIS — I10 Essential (primary) hypertension: Secondary | ICD-10-CM

## 2019-04-07 ENCOUNTER — Telehealth: Payer: Self-pay | Admitting: *Deleted

## 2019-04-07 NOTE — Telephone Encounter (Signed)
I called to inform the patient that we did not need to add another medication to her regimen due to the non-theraputic dose of her Victoza and weight loss. She stated understanding.

## 2019-04-07 NOTE — Telephone Encounter (Signed)
Call from pt - stated when she saw the doctor, he was going to stop Victoza and start an alternative but she has not hear from the pharmacy. Please advise  Thanks  "We will need to stop your Victoza and start an alternative."

## 2019-04-20 ENCOUNTER — Telehealth: Payer: Self-pay | Admitting: *Deleted

## 2019-04-20 NOTE — Telephone Encounter (Signed)
Pt calls and states she has not had lasix and K+ for 2 weeks, dr harbrecht's last note 3/3 states pt is to take 53meq of K+ every other day. Pt states she needs script sent to pharmacy [facility] [place]

## 2019-04-21 ENCOUNTER — Other Ambulatory Visit: Payer: Self-pay | Admitting: Internal Medicine

## 2019-04-21 DIAGNOSIS — I10 Essential (primary) hypertension: Secondary | ICD-10-CM

## 2019-04-22 ENCOUNTER — Other Ambulatory Visit: Payer: Self-pay | Admitting: Internal Medicine

## 2019-04-22 MED ORDER — POTASSIUM CHLORIDE ER 10 MEQ PO TBCR: EXTENDED_RELEASE_TABLET | ORAL | 3 refills | Status: DC

## 2019-04-22 NOTE — Telephone Encounter (Signed)
Needs refill on potassium; pt contact [place] [place]

## 2019-04-22 NOTE — Telephone Encounter (Signed)
For some reason I recall her telling me she had sufficient tablets as she had stopped taking. I have sent a new script to the pharmacy.

## 2019-04-22 NOTE — Telephone Encounter (Signed)
RX was sent via [facility] today. SChaplin, RN,BSN

## 2019-04-23 ENCOUNTER — Ambulatory Visit
Admission: RE | Admit: 2019-04-23 | Discharge: 2019-04-23 | Disposition: A | Discharge status: Home / Self Care | Payer: Medicare Other | Source: Ambulatory Visit | Attending: Internal Medicine | Admitting: Internal Medicine

## 2019-04-23 ENCOUNTER — Other Ambulatory Visit: Payer: Self-pay

## 2019-04-23 DIAGNOSIS — K869 Disease of pancreas, unspecified: Secondary | ICD-10-CM

## 2019-04-23 DIAGNOSIS — R634 Abnormal weight loss: Secondary | ICD-10-CM

## 2019-04-23 MED ORDER — GADOBENATE DIMEGLUMINE 529 MG/ML IV SOLN
10.0000 mL | Freq: Once | INTRAVENOUS | Status: AC | PRN
  Administered 2019-04-23: 10 mL via INTRAVENOUS

## 2019-04-25 ENCOUNTER — Encounter: Payer: Self-pay | Admitting: Internal Medicine

## 2019-04-25 DIAGNOSIS — K862 Cyst of pancreas: Secondary | ICD-10-CM

## 2019-04-25 HISTORY — DX: Cyst of pancreas: K86.2

## 2019-04-25 NOTE — Progress Notes (Signed)
Please tell her that there are no signs of cancer seen on the MRI Benign lesions in pancreas and kidney that I do not think are causing problems There is a very rare chance that 1-2 of the lesions could turn into cancer but very unlikely to ever happen in her life time  The standard is to recheck her in 1 year - so would have her come for an office visit in 1 year  I am not planning on other testing now  She should keep f/u PCP  See me prn

## 2019-04-26 ENCOUNTER — Telehealth: Payer: Self-pay | Admitting: Internal Medicine

## 2019-04-26 NOTE — Telephone Encounter (Signed)
All questions answered I mailed her a copy of the results and Dr. Celesta Aver response

## 2019-04-26 NOTE — Telephone Encounter (Signed)
Pt called back regarding results of MRI.  She stated that she does not remember what was discussed and would like a copy of report.

## 2019-04-27 ENCOUNTER — Other Ambulatory Visit: Payer: Self-pay

## 2019-04-27 ENCOUNTER — Ambulatory Visit
Admission: RE | Admit: 2019-04-27 | Discharge: 2019-04-27 | Disposition: A | Discharge status: Home / Self Care | Payer: Medicare Other | Source: Ambulatory Visit | Attending: Family Medicine | Admitting: Family Medicine

## 2019-04-27 DIAGNOSIS — Z1231 Encounter for screening mammogram for malignant neoplasm of breast: Secondary | ICD-10-CM

## 2019-05-01 ENCOUNTER — Other Ambulatory Visit: Payer: Self-pay | Admitting: Internal Medicine

## 2019-05-18 ENCOUNTER — Other Ambulatory Visit: Payer: Self-pay

## 2019-05-18 ENCOUNTER — Encounter: Payer: Self-pay | Admitting: Internal Medicine

## 2019-05-18 ENCOUNTER — Ambulatory Visit (INDEPENDENT_AMBULATORY_CARE_PROVIDER_SITE_OTHER): Payer: Medicare Other | Admitting: Internal Medicine

## 2019-05-18 VITALS — BP 132/52 | HR 68 | Temp 98.1°F | Ht 59.0 in | Wt 118.6 lb

## 2019-05-18 DIAGNOSIS — F5104 Psychophysiologic insomnia: Secondary | ICD-10-CM | POA: Diagnosis not present

## 2019-05-18 DIAGNOSIS — I129 Hypertensive chronic kidney disease with stage 1 through stage 4 chronic kidney disease, or unspecified chronic kidney disease: Secondary | ICD-10-CM

## 2019-05-18 DIAGNOSIS — Z79899 Other long term (current) drug therapy: Secondary | ICD-10-CM

## 2019-05-18 DIAGNOSIS — Z794 Long term (current) use of insulin: Secondary | ICD-10-CM

## 2019-05-18 DIAGNOSIS — K862 Cyst of pancreas: Secondary | ICD-10-CM | POA: Diagnosis not present

## 2019-05-18 DIAGNOSIS — G8929 Other chronic pain: Secondary | ICD-10-CM

## 2019-05-18 DIAGNOSIS — E1122 Type 2 diabetes mellitus with diabetic chronic kidney disease: Secondary | ICD-10-CM

## 2019-05-18 DIAGNOSIS — M25512 Pain in left shoulder: Secondary | ICD-10-CM

## 2019-05-18 DIAGNOSIS — M25511 Pain in right shoulder: Secondary | ICD-10-CM

## 2019-05-18 DIAGNOSIS — N183 Chronic kidney disease, stage 3 unspecified: Secondary | ICD-10-CM

## 2019-05-18 DIAGNOSIS — I10 Essential (primary) hypertension: Secondary | ICD-10-CM

## 2019-05-18 MED ORDER — SITAGLIPTIN PHOSPHATE 25 MG PO TABS: 25.0000 mg | ORAL_TABLET | Freq: Every day | ORAL | 5 refills | Status: DC

## 2019-05-18 NOTE — Assessment & Plan Note (Addendum)
  Bilateral shoulder pain: Patient endorses chronic shoulder pain of at least two years duration that is worse with lying on either side while at sleep. The pain is limited to sleep and does not occur during the day. She can perform her routine daily activities without interference of her shoulder pain.   Plan:  Recommended changes to sleeping pattern and position Recommended Tylenol 650mg  TID if beneficial  Recommended obtaining a new mattress given the age of her mattress to be greater than 10 years

## 2019-05-18 NOTE — Assessment & Plan Note (Signed)
Patient endorses insomnia: She stated that this is intermittent and often associated with shoulder pain. She is curious about a sleep aid. She has chronic shoulder pain of at least two year duration.  She feels that she has difficulty with sleep onset, watches TV while in bed and describes other poor sleep hygiene habits.  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  Plan:  We discussed sleep hygiene at length Recommend melatonin 5 mg twice before bed for least 2 weeks Patient advised to follow-up if no improvement

## 2019-05-18 NOTE — Patient Instructions (Signed)
FOLLOW-UP INSTRUCTIONS When: 4-6 wks after June 4th For: Routine appointment and A1c check What to bring: All of your medications  I have made changes to your medications today.   Today we discussed your diabetes for which we had to make several recent changes.  Due to your weight loss we had to stop the Victoza.  We will try another medication today in addition to your Lantus.  Please return for 6 weeks for repeat hemoglobin A1c.  Today you discussed the continuous glucose monitoring device but as we noted you need to check glucose levels every day, 4 times a day for least 4 weeks to be approved by Medicare for this.  For your sleep difficulty, I have attached some information below and recommend adequate sleep hygiene as we discussed.  Additionally, you may utilize melatonin 5 mg approximately 2 hours before your regularly scheduled bedtime.  This will take at least 2 weeks to have any potential significant benefit.  For your shoulder pain, recommend changing his sleeping position, trying Tylenol 650 mg before bed and considering a change of your mattress.  Thank you for your visit to the [facility] today. If you have any questions or concerns please call us at [phone].    Insomnia Insomnia is a sleep disorder that makes it difficult to fall asleep or stay asleep. Insomnia can cause fatigue, low energy, difficulty concentrating, mood swings, and poor performance at work or school. There are three different ways to classify insomnia:  Difficulty falling asleep.  Difficulty staying asleep.  Waking up too early in the morning. Any type of insomnia can be long-term (chronic) or short-term (acute). Both are common. Short-term insomnia usually lasts for three months or less. Chronic insomnia occurs at least three times a week for longer than three months. What are the causes? Insomnia may be caused by another condition, situation, or substance, such as:  Anxiety.  Certain  medicines.  Gastroesophageal reflux disease (GERD) or other gastrointestinal conditions.  Asthma or other breathing conditions.  Restless legs syndrome, sleep apnea, or other sleep disorders.  Chronic pain.  Menopause.  Stroke.  Abuse of alcohol, tobacco, or illegal drugs.  Mental health conditions, such as depression.  Caffeine.  Neurological disorders, such as Alzheimer's disease.  An overactive thyroid (hyperthyroidism). Sometimes, the cause of insomnia may not be known. What increases the risk? Risk factors for insomnia include:  Gender. Women are affected more often than men.  Age. Insomnia is more common as you get older.  Stress.  Lack of exercise.  Irregular work schedule or working night shifts.  Traveling between different time zones.  Certain medical and mental health conditions. What are the signs or symptoms? If you have insomnia, the main symptom is having trouble falling asleep or having trouble staying asleep. This may lead to other symptoms, such as:  Feeling fatigued or having low energy.  Feeling nervous about going to sleep.  Not feeling rested in the morning.  Having trouble concentrating.  Feeling irritable, anxious, or depressed. How is this diagnosed? This condition may be diagnosed based on:  Your symptoms and medical history. Your health care provider may ask about: ? Your sleep habits. ? Any medical conditions you have. ? Your mental health.  A physical exam. How is this treated? Treatment for insomnia depends on the cause. Treatment may focus on treating an underlying condition that is causing insomnia. Treatment may also include:  Medicines to help you sleep.  Counseling or therapy.  Lifestyle adjustments  to help you sleep better. Follow these instructions at home: Eating and drinking   Limit or avoid alcohol, caffeinated beverages, and cigarettes, especially close to bedtime. These can disrupt your sleep.  Do not  eat a large meal or eat spicy foods right before bedtime. This can lead to digestive discomfort that can make it hard for you to sleep. Sleep habits   Keep a sleep diary to help you and your health care provider figure out what could be causing your insomnia. Write down: ? When you sleep. ? When you wake up during the night. ? How well you sleep. ? How rested you feel the next day. ? Any side effects of medicines you are taking. ? What you eat and drink.  Make your bedroom a dark, comfortable place where it is easy to fall asleep. ? Put up shades or blackout curtains to block light from outside. ? Use a white noise machine to block noise. ? Keep the temperature cool.  Limit screen use before bedtime. This includes: ? Watching TV. ? Using your smartphone, tablet, or computer.  Stick to a routine that includes going to bed and waking up at the same times every day and night. This can help you fall asleep faster. Consider making a quiet activity, such as reading, part of your nighttime routine.  Try to avoid taking naps during the day so that you sleep better at night.  Get out of bed if you are still awake after 15 minutes of trying to sleep. Keep the lights down, but try reading or doing a quiet activity. When you feel sleepy, go back to bed. General instructions  Take over-the-counter and prescription medicines only as told by your health care provider.  Exercise regularly, as told by your health care provider. Avoid exercise starting several hours before bedtime.  Use relaxation techniques to manage stress. Ask your health care provider to suggest some techniques that may work well for you. These may include: ? Breathing exercises. ? Routines to release muscle tension. ? Visualizing peaceful scenes.  Make sure that you drive carefully. Avoid driving if you feel very sleepy.  Keep all follow-up visits as told by your health care provider. This is important. Contact a health  care provider if:  You are tired throughout the day.  You have trouble in your daily routine due to sleepiness.  You continue to have sleep problems, or your sleep problems get worse. Get help right away if:  You have serious thoughts about hurting yourself or someone else. If you ever feel like you may hurt yourself or others, or have thoughts about taking your own life, get help right away. You can go to your nearest emergency department or call:  Your local emergency services (911 in the [place]  A suicide crisis helpline, such as the [place] at (706)398-3495. This is open 24 hours a day. Summary  Insomnia is a sleep disorder that makes it difficult to fall asleep or stay asleep.  Insomnia can be long-term (chronic) or short-term (acute).  Treatment for insomnia depends on the cause. Treatment may focus on treating an underlying condition that is causing insomnia.  Keep a sleep diary to help you and your health care provider figure out what could be causing your insomnia. This information is not intended to replace advice given to you by your health care provider. Make sure you discuss any questions you have with your health care provider. Document Revised: 12/12/2016 Document Reviewed: 10/09/2016  Elsevier Patient Education  [employer].

## 2019-05-18 NOTE — Assessment & Plan Note (Signed)
Pancreatic cyst: Weight loss: MR ABD MRCP with Dr. Carlean Purl reassuring per his note. Weight loss likely related to GLP-1i based on available evidence. May reconsider this if her A1c veers out of control.   Plan: Monitor annually per GI

## 2019-05-18 NOTE — Assessment & Plan Note (Signed)
Hypertension: Patient's BP today is 132/52 with a goal of <140/80. The patient endorses adherence to her medication regimen. She denied, chest pain, headache, visual changes, lightheadedness, weakness, dizziness on standing, swelling in the feet or ankles.   Plan: Continue amlodipine 10mg  daily

## 2019-05-18 NOTE — Progress Notes (Signed)
   CC: Diabetes and high blood pressure  HPI:[name] is a 79 y.o. 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 Please see individual problem based A/P for details.  Depression, PHQ-9: Based on the patients    Office Visit from 05/18/2019 in [place]  PHQ-9 Total Score  6     score we have decided to monitor.  Past Medical History:  Diagnosis Date  . Acute colitis 10/14/2018  . Bloody diarrhea 10/13/2018  . CAD (coronary artery disease) 2006   s/p Quadrupal CABG 2006  . CHF (congestive heart failure) ([place]) 2010   EF 30-35% from Echo 06/2008  . CKD (chronic kidney disease) stage 4, GFR 15-29 ml/min (HCC)    fluctuating between stage 3 and 4 depending on GFR  . CKD (chronic kidney disease), stage III 09/27/2013  . De Quervain's tenosynovitis, left 11/13/2015  . Diverticulosis   . DIVERTICULOSIS OF COLON 04/01/2007   Qualifier: Diagnosis of  By: Nelson-Smith CMA (AAMA), [name]    . DM (diabetes mellitus) ([place])    insulin dependent  . Dyslipidemia 10/15/2005   Qualifier: Diagnosis of  By: Derrel Nip MD, Helene Kelp    . History of colonic polyps   . HLD (hyperlipidemia)   . HTN (hypertension)   . Lumbar spinal stenosis   . MI (myocardial infarction) ([place]) 2003   . [place]  . Mouth ulcer adjacent to jaw bone 08/22/2010   S/p removal by ENT 09/2010   . MYOCARDIAL INFARCTION, HX OF 09/09/2006   Qualifier: Diagnosis of  By: Marinda Elk MD, Sonia Side    . Nephrolithiasis    hx of  . NEPHROLITHIASIS 04/01/2007   Qualifier: Diagnosis of  By: Harlon Ditty CMA (AAMA), [name]    . OA (osteoarthritis)   . Osteopenia 2010   T score of -1.8  . Pancreatic cysts, - look benign by MR 04/25/2019  . Psoriasis   . PVD (peripheral vascular disease) (HCC)    ABI indicated moderated reduction arterial flow on the left.  . Tricuspid regurgitation 2009   moderate to severe, EF 55%   Review of Systems:  ROS negative except as per HPI.  Physical Exam: Vitals:   05/18/19 1446  BP: (!) 132/52  Pulse: 68  Temp: 98.1 F (36.7 C)  TempSrc: Oral  SpO2: 97%  Weight: 118 lb 9.6 oz (53.8 kg)  Height: 4\' 11"  (1.499 m)   Filed Weights   05/18/19 1446  Weight: 118 lb 9.6 oz (53.8 kg)   General: A/O x4, in no acute distress, afebrile, nondiaphoretic HEENT: PEERL, EMO intact Cardio: RRR, no mrg's  Pulmonary: CTA bilaterally,  no wheezing or crackles  Abdomen: Bowel sounds normal, soft, nontender  MSK: BLE nontender, nonedematous Neuro: Alert, conversational,  normal gait Psych: Appropriate affect, not depressed in appearance, engages well  Assessment & Plan:   See Encounters Tab for problem based charting.  Patient discussed with Dr. Dareen Piano

## 2019-05-18 NOTE — Assessment & Plan Note (Signed)
DMII: Hgb A1c 7.6 % at the last visit. Medicare patient so do not repeat A1c within three months exactly. The patient denied polyuria, polydipsia, headache, fatigue, confusion, nausea, vomiting or diaphoresis. CBG data today demonstrated a consistent morning reading averaging 148 with 31 recorded values over the past 31 days, a single high noon reading of 324 and a single high evening reading of 218. We discussed at length the need to record at least one additional data point in the evening before her late meal. We also discussed considering continuous glucose monitor and she will need at least 4 blood sugar data points daily for proxy 1 month prior to Medicare approving such a device.  She said she will work on this.  Plan: Continue lantus 35U daily Started [name] 25 mg daily Repeat A1c at next visit if after June 3 Return in 4 to 6 weeks with PCP

## 2019-05-19 NOTE — Progress Notes (Signed)
Internal Medicine Clinic Attending  Case discussed with Dr. Harbrecht at the time of the visit.  We reviewed the resident's history and exam and pertinent patient test results.  I agree with the assessment, diagnosis, and plan of care documented in the resident's note.   

## 2019-06-29 ENCOUNTER — Other Ambulatory Visit: Payer: Self-pay

## 2019-06-29 ENCOUNTER — Encounter: Payer: Self-pay | Admitting: Internal Medicine

## 2019-06-29 ENCOUNTER — Ambulatory Visit (HOSPITAL_COMMUNITY)
Admission: RE | Admit: 2019-06-29 | Discharge: 2019-06-29 | Disposition: A | Discharge status: Home / Self Care | Payer: Medicare Other | Source: Ambulatory Visit | Attending: Student in an Organized Health Care Education/Training Program | Admitting: Student in an Organized Health Care Education/Training Program

## 2019-06-29 ENCOUNTER — Ambulatory Visit (INDEPENDENT_AMBULATORY_CARE_PROVIDER_SITE_OTHER): Payer: Medicare Other | Admitting: Internal Medicine

## 2019-06-29 VITALS — BP 150/68 | HR 63 | Temp 97.8°F | Ht 59.0 in | Wt 120.9 lb

## 2019-06-29 DIAGNOSIS — N1831 Chronic kidney disease, stage 3a: Secondary | ICD-10-CM | POA: Diagnosis not present

## 2019-06-29 DIAGNOSIS — I10 Essential (primary) hypertension: Secondary | ICD-10-CM | POA: Diagnosis not present

## 2019-06-29 DIAGNOSIS — E1121 Type 2 diabetes mellitus with diabetic nephropathy: Secondary | ICD-10-CM

## 2019-06-29 DIAGNOSIS — Z794 Long term (current) use of insulin: Secondary | ICD-10-CM

## 2019-06-29 DIAGNOSIS — G8929 Other chronic pain: Secondary | ICD-10-CM

## 2019-06-29 DIAGNOSIS — M25512 Pain in left shoulder: Secondary | ICD-10-CM | POA: Diagnosis present

## 2019-06-29 DIAGNOSIS — M25511 Pain in right shoulder: Secondary | ICD-10-CM | POA: Insufficient documentation

## 2019-06-29 LAB — POCT GLYCOSYLATED HEMOGLOBIN (HGB A1C): Hemoglobin A1C: 8.4 % — AB (ref 4.0–5.6)

## 2019-06-29 LAB — GLUCOSE, CAPILLARY: Glucose-Capillary: 193 mg/dL — ABNORMAL HIGH (ref 70–99)

## 2019-06-29 MED ORDER — LIRAGLUTIDE 18 MG/3ML ~~LOC~~ SOPN: PEN_INJECTOR | SUBCUTANEOUS | 5 refills | Status: DC

## 2019-06-29 MED ORDER — SITAGLIPTIN PHOSPHATE 25 MG PO TABS: 25.0000 mg | ORAL_TABLET | Freq: Every day | ORAL | 3 refills | Status: DC

## 2019-06-29 NOTE — Assessment & Plan Note (Signed)
DMII: Hgb A1c 7.6 % at the last visit. Current A1c 8.4%. The patient denied polyuria, polydipsia, headache, fatigue, confusion, nausea, vomiting or diaphoresis. Patient presents today for follow-up of her diabetes management.  I reviewed her glucometer data today. Average 134 over 27 morning readings only in 31 days with a high of 198, low 75 that was not symptomatic.  Plan: Continue lantus 35 units daily Continue [name] 25mg  daily Restart Victoza 0.6mg  increased to 1.2mg  after one week Observe for excess weight loss Repeat A1c at next visit in three months

## 2019-06-29 NOTE — Assessment & Plan Note (Signed)
Bilateral Shoulder Pain: Pain with active and passive ROM. Reduce ROM to <180 degrees. She did not recall if Voltaren gel had helped in the past. She noted only minor changes in her daily activities stating that he sleep is the most effected.  Agree that this is likely osteoarthritis given the bilateral nature, pain with passive and active ROM and mild impingement.  Plan: No images that I can tell so we will get plain films Trial of voltaren gel recommended F/up plain films, will recommend corticosteroid injection most likely

## 2019-06-29 NOTE — Patient Instructions (Signed)
FOLLOW-UP INSTRUCTIONS When: 3-4 months For: Routine evaluation What to bring: All of your medications  I have made several changes to your medications today. Please consider picking up a tube of Voltaren Gel (diclofenac sodium gel) for your shoulders. We have ordered xrays of both shoulders. We will notify you of the options for treatment following the shoulder imaging.   For your diabetes, I recommend that you restart the Victoza (liraglutide) at 0.6mg  daily for one week then increase to 1.2mg  daily thereafter. Please notify me of any concerns.   Thank you for your visit to the [facility] today. If you have any questions or concerns please call us at [phone].

## 2019-06-29 NOTE — Progress Notes (Signed)
   CC: HTN and DMII  HPI:[name] is a 79 y.o. female who presents for evaluation of HTN and DMII. Please see individual problem based A/P for details.  Past Medical History:  Diagnosis Date  . Acute colitis 10/14/2018  . Bloody diarrhea 10/13/2018  . CAD (coronary artery disease) 2006   s/p Quadrupal CABG 2006  . CHF (congestive heart failure) ([place]) 2010   EF 30-35% from Echo 06/2008  . CKD (chronic kidney disease) stage 4, GFR 15-29 ml/min (HCC)    fluctuating between stage 3 and 4 depending on GFR  . CKD (chronic kidney disease), stage III 09/27/2013  . De Quervain's tenosynovitis, left 11/13/2015  . Diverticulosis   . DIVERTICULOSIS OF COLON 04/01/2007   Qualifier: Diagnosis of  By: Nelson-Smith CMA (AAMA), [name]    . DM (diabetes mellitus) ([place])    insulin dependent  . Dyslipidemia 10/15/2005   Qualifier: Diagnosis of  By: Derrel Nip MD, Helene Kelp    . History of colonic polyps   . HLD (hyperlipidemia)   . HTN (hypertension)   . Lumbar spinal stenosis   . MI (myocardial infarction) ([place]) 2003   . [place]  . Mouth ulcer adjacent to jaw bone 08/22/2010   S/p removal by ENT 09/2010   . MYOCARDIAL INFARCTION, HX OF 09/09/2006   Qualifier: Diagnosis of  By: Marinda Elk MD, Sonia Side    . Nephrolithiasis    hx of  . NEPHROLITHIASIS 04/01/2007   Qualifier: Diagnosis of  By: Harlon Ditty CMA (AAMA), [name]    . OA (osteoarthritis)   . Osteopenia 2010   T score of -1.8  . Pancreatic cysts, - look benign by MR 04/25/2019  . Psoriasis   . PVD (peripheral vascular disease) (HCC)    ABI indicated moderated reduction arterial flow on the left.  . Tricuspid regurgitation 2009   moderate to severe, EF 55%   Review of Systems:  ROS negative except as per HPI.  Physical Exam: Vitals:   06/29/19 1439  BP: (!) 150/68  Pulse: 63  Temp: 97.8 F (36.6 C)  TempSrc: Oral  SpO2: 98%  Weight: 120 lb 14.4 oz (54.8 kg)  Height: 4\' 11"  (1.499 m)   Filed Weights   06/29/19 1439    Weight: 120 lb 14.4 oz (54.8 kg)   General: A/O x4, in no acute distress, afebrile, nondiaphoretic HEENT: PEERL, EMO intact Cardio: RRR, no mrg's  Pulmonary: CTA bilaterally, no wheezing or crackles  Abdomen: Bowel sounds normal, soft, nontender  MSK: BLE nontender, nonedematous Shoulder: ROM reduced bilaterally to <180 degrees, pain with active and passive ROM, Negative [place], cross arm and can test. Neuro intact distally. Psych: Appropriate affect, not depressed in appearance, engages well  Assessment & Plan:   See Encounters Tab for problem based charting.  Patient discussed with Dr. Evette Doffing

## 2019-06-29 NOTE — Assessment & Plan Note (Signed)
Hypertension: Patient's BP today is 150/68 with a goal of <140/80. The patient endorses adherence to her medication regimen. She denied, chest pain, headache, visual changes, lightheadedness, weakness, dizziness on standing, swelling in the feet or ankles.   Plan: Continue Amlodipine 10mg  daily Consider additional therapy if this remains elevated. We had been removing antihypertensives due to low BP that was symptomatic.

## 2019-06-30 NOTE — Progress Notes (Signed)
Internal Medicine Clinic Attending  Case discussed with Dr. Harbrecht at the time of the visit.  We reviewed the resident's history and exam and pertinent patient test results.  I agree with the assessment, diagnosis, and plan of care documented in the resident's note.   

## 2019-07-07 ENCOUNTER — Telehealth: Payer: Self-pay | Admitting: Internal Medicine

## 2019-07-07 DIAGNOSIS — M19219 Secondary osteoarthritis, unspecified shoulder: Secondary | ICD-10-CM

## 2019-07-07 NOTE — Telephone Encounter (Signed)
Called and notified the patient of the bilateral osteoarthritis of the shoulder. Additionally, there was noted to be probable rotator cuff pathology with a high riding humeral head on the right. Options given were injection of the left GH joint, orthopedic evaluation of the right vs MRI. She prefers to consider her options and will discuss this when she returns from vacation. She has a preferred orthopedic physician that she would like to see if this is the option she chooses.

## 2019-07-11 ENCOUNTER — Encounter: Payer: Self-pay | Admitting: *Deleted

## 2019-07-19 ENCOUNTER — Other Ambulatory Visit: Payer: Self-pay | Admitting: Internal Medicine

## 2019-07-19 DIAGNOSIS — K219 Gastro-esophageal reflux disease without esophagitis: Secondary | ICD-10-CM

## 2019-07-21 ENCOUNTER — Other Ambulatory Visit: Payer: Self-pay | Admitting: Internal Medicine

## 2019-09-14 ENCOUNTER — Other Ambulatory Visit: Payer: Self-pay

## 2019-09-14 ENCOUNTER — Ambulatory Visit (INDEPENDENT_AMBULATORY_CARE_PROVIDER_SITE_OTHER): Payer: Medicare Other | Admitting: Student

## 2019-09-14 ENCOUNTER — Encounter: Payer: Self-pay | Admitting: Student

## 2019-09-14 VITALS — BP 147/60 | HR 64 | Temp 98.1°F | Ht 61.0 in | Wt 119.5 lb

## 2019-09-14 DIAGNOSIS — Z23 Encounter for immunization: Secondary | ICD-10-CM | POA: Diagnosis not present

## 2019-09-14 DIAGNOSIS — Z760 Encounter for issue of repeat prescription: Secondary | ICD-10-CM | POA: Diagnosis not present

## 2019-09-14 DIAGNOSIS — I130 Hypertensive heart and chronic kidney disease with heart failure and stage 1 through stage 4 chronic kidney disease, or unspecified chronic kidney disease: Secondary | ICD-10-CM | POA: Diagnosis not present

## 2019-09-14 DIAGNOSIS — Z794 Long term (current) use of insulin: Secondary | ICD-10-CM

## 2019-09-14 DIAGNOSIS — E1121 Type 2 diabetes mellitus with diabetic nephropathy: Secondary | ICD-10-CM | POA: Diagnosis not present

## 2019-09-14 DIAGNOSIS — N1831 Chronic kidney disease, stage 3a: Secondary | ICD-10-CM

## 2019-09-14 DIAGNOSIS — I5030 Unspecified diastolic (congestive) heart failure: Secondary | ICD-10-CM

## 2019-09-14 DIAGNOSIS — M67911 Unspecified disorder of synovium and tendon, right shoulder: Secondary | ICD-10-CM

## 2019-09-14 DIAGNOSIS — M19219 Secondary osteoarthritis, unspecified shoulder: Secondary | ICD-10-CM

## 2019-09-14 DIAGNOSIS — E1122 Type 2 diabetes mellitus with diabetic chronic kidney disease: Secondary | ICD-10-CM

## 2019-09-14 DIAGNOSIS — M67919 Unspecified disorder of synovium and tendon, unspecified shoulder: Secondary | ICD-10-CM | POA: Insufficient documentation

## 2019-09-14 DIAGNOSIS — I10 Essential (primary) hypertension: Secondary | ICD-10-CM

## 2019-09-14 DIAGNOSIS — E782 Mixed hyperlipidemia: Secondary | ICD-10-CM

## 2019-09-14 DIAGNOSIS — E785 Hyperlipidemia, unspecified: Secondary | ICD-10-CM

## 2019-09-14 LAB — POCT GLYCOSYLATED HEMOGLOBIN (HGB A1C): Hemoglobin A1C: 7.3 % — AB (ref 4.0–5.6)

## 2019-09-14 LAB — GLUCOSE, CAPILLARY: Glucose-Capillary: 156 mg/dL — ABNORMAL HIGH (ref 70–99)

## 2019-09-14 MED ORDER — NITROGLYCERIN 0.3 MG SL SUBL: 0.3000 mg | SUBLINGUAL_TABLET | SUBLINGUAL | 12 refills | Status: DC | PRN

## 2019-09-14 NOTE — Assessment & Plan Note (Addendum)
HPI:  A1c at last visit 2 months ago was 8.4.  A1c today improved to 7.3.  Patient reports she has been taking Lantus 35 units and [name] 25 units daily.  Glucometer reviewed.  Patient checks her blood sugars in the morning which have been within range.  Denies lows.  Assessment/plan: -Counseled patient to continue her Lantus 35 and [name] 25 daily. -Patient provided urine sample for microalbumin/creatinine ratio. -BMP obtained at the end of visit. -Advised the patient to continue taking olmesartan 5 mg daily.

## 2019-09-14 NOTE — Assessment & Plan Note (Signed)
The patient received her flu vaccine today.

## 2019-09-14 NOTE — Patient Instructions (Addendum)
[name],  It was a pleasure meeting you. Today we discussed:  1) shoulder pain: I'm glad you have made an appointment with Dr. Onnie Graham. In the meantime, continue taking Tylenol as needed.  2) Hypertension: Your blood pressure was 147/60 today. Continue taking your amlodipine as prescribed. We recommend you take your blood pressure first thing in the morning and write that number down so that we can review at your next visit.  3) We have refilled your nitroglycerin.  4) Diabetes: Your A1c decreased from 8.4 to 7.3, which is excellent. Continue your Lantus and [name]. We got blood work today to assess your kidney function.  You received your flu shot today.  Please return for follow-up in 3 months.   Take care!

## 2019-09-14 NOTE — Assessment & Plan Note (Signed)
HPI:  Blood pressure today 147/60.  BP 150/68 at last visit about 2 months ago.  Patient reports she has been taking her amlodipine 10 mg daily as prescribed.  Notes that in the morning when she first gets out of bed, she will feel slight lightheadedness upon standing that resolves after a couple seconds.  Reports that she drinks water throughout the day.  Denies headaches, blurry vision.  Assessment/plan:  Although the patient is slightly above goal of less than 140/80, with her reported slight lightheadedness with standing, and her low diastolic pressure, advised her to continue with current amlodipine dose.  Note that she is also taking olmesartan 5 mg daily.  Advised the patient to take her blood pressure in the morning and write those down so that we can discuss at her follow-up in 3 months.

## 2019-09-14 NOTE — Assessment & Plan Note (Signed)
Patient's last creatinine in March 2021 was 1.04.  BMP and urine microalbumin/creatinine ratio obtained after today's visit.  Patient takes olmesartan 5 mg daily.

## 2019-09-14 NOTE — Progress Notes (Signed)
   CC: medication refill  HPI:  [name] is a 79 y.o. woman with history hypertension, diabetes, CKD3 who who presents to clinic for medication refill. Her last clinic visit was ~2 months ago on 06/29/19.  To see the details of [name]' management of her acute and chronic problems, please refer to the A&P under the Encounters tab.    Past Medical History:  Diagnosis Date  . Acute colitis 10/14/2018  . Bloody diarrhea 10/13/2018  . CAD (coronary artery disease) 2006   s/p Quadrupal CABG 2006  . CHF (congestive heart failure) ([place]) 2010   EF 30-35% from Echo 06/2008  . CKD (chronic kidney disease) stage 4, GFR 15-29 ml/min (HCC)    fluctuating between stage 3 and 4 depending on GFR  . CKD (chronic kidney disease), stage III 09/27/2013  . De Quervain's tenosynovitis, left 11/13/2015  . Diverticulosis   . DIVERTICULOSIS OF COLON 04/01/2007   Qualifier: Diagnosis of  By: Nelson-Smith CMA (AAMA), [name]    . DM (diabetes mellitus) ([place])    insulin dependent  . Dyslipidemia 10/15/2005   Qualifier: Diagnosis of  By: Derrel Nip MD, Helene Kelp    . History of colonic polyps   . HLD (hyperlipidemia)   . HTN (hypertension)   . Lumbar spinal stenosis   . MI (myocardial infarction) ([place]) 2003   . [place]  . Mouth ulcer adjacent to jaw bone 08/22/2010   S/p removal by ENT 09/2010   . MYOCARDIAL INFARCTION, HX OF 09/09/2006   Qualifier: Diagnosis of  By: Marinda Elk MD, Sonia Side    . Nephrolithiasis    hx of  . NEPHROLITHIASIS 04/01/2007   Qualifier: Diagnosis of  By: Harlon Ditty CMA (AAMA), [name]    . OA (osteoarthritis)   . Osteopenia 2010   T score of -1.8  . Pancreatic cysts, - look benign by MR 04/25/2019  . Psoriasis   . PVD (peripheral vascular disease) (HCC)    ABI indicated moderated reduction arterial flow on the left.  . Tricuspid regurgitation 2009   moderate to severe, EF 55%   Review of Systems:    Review of Systems  Constitutional: Negative for chills and fever.   HENT: Negative for hearing loss.   Eyes: Negative for blurred vision.  Cardiovascular: Negative for chest pain and palpitations.  Gastrointestinal: Negative for abdominal pain.  Genitourinary: Negative for dysuria.  Neurological: Negative for dizziness and headaches.   Physical Exam:  Constitutional: well-appearing older woman sitting in chair, in no acute distress HENT: Normocephalic atraumatic, mucous membranes moist Eyes: Conjunctiva non erythematous Neck: Supple Cardiovascular: Normal heart sounds, no murmurs rubs or gallops Pulmonary/Chest: Normal work of breathing on room air, lungs clear to auscultation bilaterally Abdominal: Nondistended MSK: Pain with active and passive movement of bilateral arms Neurological: Alert and oriented x3 Skin: No rashes or bruises   Vitals:   09/14/19 1450  BP: (!) 147/60  Pulse: 64  Temp: 98.1 F (36.7 C)  TempSrc: Oral  SpO2: 99%  Weight: 119 lb 8 oz (54.2 kg)  Height: 5\' 1"  (1.549 m)    Assessment & Plan:   See Encounters Tab for problem based charting.  Patient seen with Dr. Evette Doffing

## 2019-09-14 NOTE — Assessment & Plan Note (Signed)
And lipid panel was obtained after today's visit.

## 2019-09-14 NOTE — Assessment & Plan Note (Signed)
HPI:  At the patient's last visit, x-rays of her bilateral shoulders were obtained which demonstrated joint space narrowing, subchondral sclerosis, subchondral cyst formation and osteophyte formation in the glenohumeral joint, indicative of osteoarthritis.  At that time the following options were discussed: Injection versus referral to orthopedic surgeon versus MRI.  Patient reports she has scheduled an appointment with a Dr. Onnie Graham for further evaluation and treatment.  This appointment is scheduled for 09/21/2019 at 10 AM.  In the meantime, the patient reports 650 mg of Tylenol daily at night has been easing some of the pain.  She notes that she cannot sleep on either shoulder.  Assessment/plan:  Advised the patient to continue her current use of acetaminophen for pain relief.  Advised her to keep her appointment with the orthopedic surgeon.

## 2019-09-15 LAB — SPECIMEN STATUS

## 2019-09-15 LAB — LIPID PANEL
Chol/HDL Ratio: 3.5 ratio (ref 0.0–4.4)
Cholesterol, Total: 172 mg/dL (ref 100–199)
HDL: 49 mg/dL (ref >39)
LDL Chol Calc (NIH): 71 mg/dL (ref 0–99)
Triglycerides: 326 mg/dL — ABNORMAL HIGH (ref 0–149)
VLDL Cholesterol Cal: 52 mg/dL — ABNORMAL HIGH (ref 5–40)

## 2019-09-15 LAB — BMP8+ANION GAP
Anion Gap: 15 mmol/L (ref 10.0–18.0)
BUN/Creatinine Ratio: 11 — ABNORMAL LOW (ref 12–28)
BUN: 14 mg/dL (ref 8–27)
CO2: 26 mmol/L (ref 20–29)
Calcium: 9.6 mg/dL (ref 8.7–10.3)
Chloride: 101 mmol/L (ref 96–106)
Creatinine, Ser: 1.25 mg/dL — ABNORMAL HIGH (ref 0.57–1.00)
GFR calc Af Amer: 47 mL/min/{1.73_m2} — ABNORMAL LOW (ref >59)
GFR calc non Af Amer: 40 mL/min/{1.73_m2} — ABNORMAL LOW (ref >59)
Glucose: 142 mg/dL — ABNORMAL HIGH (ref 65–99)
Potassium: 4.2 mmol/L (ref 3.5–5.2)
Sodium: 142 mmol/L (ref 134–144)

## 2019-09-15 LAB — MICROALBUMIN / CREATININE URINE RATIO
Creatinine, Urine: 33.1 mg/dL
Microalb/Creat Ratio: 227 mg/g creat — ABNORMAL HIGH (ref 0–29)
Microalbumin, Urine: 75.1 ug/mL

## 2019-09-15 NOTE — Progress Notes (Signed)
Internal Medicine Clinic Attending  I saw and evaluated the patient.  I personally confirmed the key portions of the history and exam documented by Dr. Watson and I reviewed pertinent patient test results.  The assessment, diagnosis, and plan were formulated together and I agree with the documentation in the resident's note.  

## 2019-09-15 NOTE — Addendum Note (Signed)
Addended by: Lalla Brothers T on: 09/15/2019 09:52 AM   Modules accepted: Level of Service

## 2019-12-02 ENCOUNTER — Other Ambulatory Visit: Payer: Self-pay

## 2019-12-02 DIAGNOSIS — N1831 Chronic kidney disease, stage 3a: Secondary | ICD-10-CM

## 2019-12-02 MED ORDER — LANTUS SOLOSTAR 100 UNIT/ML ~~LOC~~ SOPN: 35.0000 [IU] | PEN_INJECTOR | Freq: Every day | SUBCUTANEOUS | 5 refills | Status: DC

## 2019-12-02 NOTE — Telephone Encounter (Signed)
Insulin Glargine (LANTUS SOLOSTAR) 100 UNIT/ML Solostar Pen, REFILL REQUEST @  [id number] [address], [place] AT [address] Phone:  [phone]  Fax:  [phone]

## 2019-12-26 ENCOUNTER — Encounter: Payer: Medicare Other | Admitting: Student

## 2019-12-27 ENCOUNTER — Ambulatory Visit (INDEPENDENT_AMBULATORY_CARE_PROVIDER_SITE_OTHER): Payer: Medicare Other | Admitting: Student

## 2019-12-27 ENCOUNTER — Encounter: Payer: Self-pay | Admitting: Student

## 2019-12-27 VITALS — BP 144/59 | HR 71 | Temp 98.4°F | Wt 115.2 lb

## 2019-12-27 DIAGNOSIS — I10 Essential (primary) hypertension: Secondary | ICD-10-CM

## 2019-12-27 DIAGNOSIS — E1122 Type 2 diabetes mellitus with diabetic chronic kidney disease: Secondary | ICD-10-CM | POA: Diagnosis not present

## 2019-12-27 DIAGNOSIS — Z23 Encounter for immunization: Secondary | ICD-10-CM

## 2019-12-27 DIAGNOSIS — I129 Hypertensive chronic kidney disease with stage 1 through stage 4 chronic kidney disease, or unspecified chronic kidney disease: Secondary | ICD-10-CM | POA: Diagnosis not present

## 2019-12-27 DIAGNOSIS — Z794 Long term (current) use of insulin: Secondary | ICD-10-CM

## 2019-12-27 DIAGNOSIS — N1831 Chronic kidney disease, stage 3a: Secondary | ICD-10-CM | POA: Diagnosis not present

## 2019-12-27 DIAGNOSIS — Z0000 Encounter for general adult medical examination without abnormal findings: Secondary | ICD-10-CM

## 2019-12-27 LAB — POCT GLYCOSYLATED HEMOGLOBIN (HGB A1C): Hemoglobin A1C: 7.7 % — AB (ref 4.0–5.6)

## 2019-12-27 LAB — GLUCOSE, CAPILLARY: Glucose-Capillary: 214 mg/dL — ABNORMAL HIGH (ref 70–99)

## 2019-12-27 MED ORDER — LANTUS SOLOSTAR 100 UNIT/ML ~~LOC~~ SOPN: 35.0000 [IU] | PEN_INJECTOR | Freq: Every day | SUBCUTANEOUS | 5 refills | Status: DC

## 2019-12-27 NOTE — Assessment & Plan Note (Signed)
Counseled patient regarding her eligibility for her COVID-19 booster. She states she plans on getting her booster shot.

## 2019-12-27 NOTE — Assessment & Plan Note (Signed)
Patient presents for further evaluation and management of DM. Most recent hemoglobin A1c 3 months ago was 7.3%.  A1c today is 7.7%.  Lab Results  Component Value Date   HGBA1C 7.7 (A) 12/27/2019   Glucometer reviewed. She checks her blood sugars most every morning, with most in range except for occasional elevated sugars in the low 200s. More concerning are the handful of lows, lowest 62-80s, asymptomatic.   Assessment/plan: - Counseled patient to continue her Lantus 35 and [name] 25 daily, though will consider adjustments to her regimen with the assistance of pharmacy. - Counseled patient to monitor closely for lows and let us know accordingly. - Discussed olmesartan 5 mg daily, however the patient does not seem to be taking it as prescribed. Will follow-up discussion with pharmacy.

## 2019-12-27 NOTE — Assessment & Plan Note (Signed)
Patient presents today for further evaluation and management of her HTN.   BP today:  Blood Pressure 12/27/2019 09/14/2019 06/29/2019 05/18/2019 03/25/2019  BP 144/59 147/60 150/68 132/52 128/58     She reports checking her BP once daily in the morning, and her SBPs have been in the 140s, she is unsure of the DBP. Denies headaches, blurry vision, dizziness. Notes that she sometimes feels lightheaded upon standing up in the morning but denies falls.  States she is taking amlodipine 10 mg daily as prescribed. She is unsure if she is taking olmesartan 5 mg daily. She has a list of her medications written down, and on review of this list, I see valsartan 320 mg daily written as well. She does not have her medications with her today.   A/P: Though the patient is above goal of <140/80, since there is some confusion regarding the antihypertensive medications she is taking, I have arranged for her to follow-up for an appointment with our clinic pharmacist for medicine reconciliation.  - Previously prescribed amlodipine 10 mg daily and olmesartan 5 mg daily - Pharmacy appointment scheduled; will follow-up recs - Advised patient to continue to check her BP a couple of times weekly to review at her next appointment

## 2019-12-27 NOTE — Progress Notes (Signed)
Internal Medicine Clinic Attending  I saw and evaluated the patient.  I personally confirmed the key portions of the history and exam documented by Dr. Watson and I reviewed pertinent patient test results.  The assessment, diagnosis, and plan were formulated together and I agree with the documentation in the resident's note.  Morrison Masser, M.D., Ph.D.  

## 2019-12-27 NOTE — Patient Instructions (Addendum)
[name],   Thank you for your visit to the [facility] today. It was a pleasure seeing you. Today we discussed the following:  1) Diabetes: your hemoglobin A1c was stable at 7.7% today. We will not make any changes to your diabetes medications until you have a chance to review your medication list with our pharmacist. Definitely let us know if you have symptomatic or asymptomatic low blood sugars.   2) Hypertension: your blood pressure was stable today. Continue your current regimen for now. Our pharmacist will review your medication list to make sure the meds you are taking at home match up with what we have on our record.  3) Medication review: we have arranged for you to coordinate with our pharmacist to review your medications.  4) You got your flu shot today!  5) COVID booster - you are eligible to get your COVID booster shot, which we highly recommend. - You can get the booster at any major pharmacy. If you are interested in setting up an appointment to get vaccinated through [facility], you can call the [place] COVID-19 hotline at [phone].   We would like to see you back in 3 months. Please bring all of your medications with you.   If you have any questions or concerns, please call our clinic at [phone] between 9am-5pm. Outside of these hours, call [phone] and ask for the internal medicine resident on call. If you feel you are having a medical emergency please call 911.

## 2019-12-27 NOTE — Progress Notes (Signed)
   CC: follow-up on HTN, DM  HPI:  [name] is a 79 y.o. woman with history as below who presents to clinic for 59-month follow-up of HTN and DM. Her last clinic visit was on 09/14/19.   To see the details of this patient's management of their acute and chronic problems, please refer to the Assessment & Plan under the Encounters tab.    Past Medical History:  Diagnosis Date  . Acute colitis 10/14/2018  . Bloody diarrhea 10/13/2018  . CAD (coronary artery disease) 2006   s/p Quadrupal CABG 2006  . CHF (congestive heart failure) ([place]) 2010   EF 30-35% from Echo 06/2008  . CKD (chronic kidney disease) stage 4, GFR 15-29 ml/min (HCC)    fluctuating between stage 3 and 4 depending on GFR  . CKD (chronic kidney disease), stage III ([place]) 09/27/2013  . De Quervain's tenosynovitis, left 11/13/2015  . Diverticulosis   . DIVERTICULOSIS OF COLON 04/01/2007   Qualifier: Diagnosis of  By: Nelson-Smith CMA (AAMA), [name]    . DM (diabetes mellitus) ([place])    insulin dependent  . Dyslipidemia 10/15/2005   Qualifier: Diagnosis of  By: Derrel Nip MD, Helene Kelp    . History of colonic polyps   . HLD (hyperlipidemia)   . HTN (hypertension)   . Lumbar spinal stenosis   . MI (myocardial infarction) ([place]) 2003   . [place]   . Mouth ulcer adjacent to jaw bone 08/22/2010   S/p removal by ENT 09/2010   . MYOCARDIAL INFARCTION, HX OF 09/09/2006   Qualifier: Diagnosis of  By: Marinda Elk MD, Sonia Side    . Nephrolithiasis    hx of  . NEPHROLITHIASIS 04/01/2007   Qualifier: Diagnosis of  By: Harlon Ditty CMA (AAMA), [name]    . OA (osteoarthritis)   . Osteopenia 2010   T score of -1.8  . Pancreatic cysts, - look benign by MR 04/25/2019  . Psoriasis   . PVD (peripheral vascular disease) (HCC)    ABI indicated moderated reduction arterial flow on the left.  . Tricuspid regurgitation 2009   moderate to severe, EF 55%   Review of Systems:    Review of Systems  Constitutional: Negative for chills and  fever.  Eyes: Negative for blurred vision.  Respiratory: Negative for cough and shortness of breath.   Cardiovascular: Negative for chest pain and leg swelling.  Neurological: Negative for dizziness, weakness and headaches.  Psychiatric/Behavioral: Negative for depression.    Physical Exam:  Vitals:   12/27/19 1014  BP: (!) 144/59  Pulse: 71  Temp: 98.4 F (36.9 C)  TempSrc: Oral  SpO2: 100%  Weight: 115 lb 3.2 oz (52.3 kg)   Constitutional: well-appearing woman sitting in chair, in no acute distress HENT: Normocephalic atraumatic, mucous membranes moist Eyes: Conjunctiva non erythematous Neck: Supple Cardiovascular: Normal heart sounds, no murmurs rubs or gallops Pulmonary/Chest: Normal work of breathing on room air Abdominal: Nondistended MSK: Normal bulk and tone Neurological: Alert and oriented x3 Skin: No rashes or bruises   Assessment & Plan:   See Encounters Tab for problem based charting.  Patient seen with Dr. Rebeca Alert

## 2020-01-03 ENCOUNTER — Encounter: Payer: Medicare Other | Admitting: Pharmacist

## 2020-01-15 ENCOUNTER — Other Ambulatory Visit: Payer: Self-pay

## 2020-01-15 ENCOUNTER — Emergency Department (HOSPITAL_COMMUNITY): Payer: Medicare Other

## 2020-01-15 ENCOUNTER — Emergency Department (HOSPITAL_COMMUNITY)
Admission: EM | Admit: 2020-01-15 | Discharge: 2020-01-15 | Disposition: A | Discharge status: Home / Self Care | Payer: Medicare Other | Attending: Emergency Medicine | Admitting: Emergency Medicine

## 2020-01-15 DIAGNOSIS — N184 Chronic kidney disease, stage 4 (severe): Secondary | ICD-10-CM | POA: Diagnosis not present

## 2020-01-15 DIAGNOSIS — Y92481 Parking lot as the place of occurrence of the external cause: Secondary | ICD-10-CM | POA: Diagnosis not present

## 2020-01-15 DIAGNOSIS — Z7982 Long term (current) use of aspirin: Secondary | ICD-10-CM | POA: Diagnosis not present

## 2020-01-15 DIAGNOSIS — S0993XA Unspecified injury of face, initial encounter: Secondary | ICD-10-CM | POA: Diagnosis present

## 2020-01-15 DIAGNOSIS — E1122 Type 2 diabetes mellitus with diabetic chronic kidney disease: Secondary | ICD-10-CM | POA: Diagnosis not present

## 2020-01-15 DIAGNOSIS — I130 Hypertensive heart and chronic kidney disease with heart failure and stage 1 through stage 4 chronic kidney disease, or unspecified chronic kidney disease: Secondary | ICD-10-CM | POA: Insufficient documentation

## 2020-01-15 DIAGNOSIS — I2510 Atherosclerotic heart disease of native coronary artery without angina pectoris: Secondary | ICD-10-CM | POA: Insufficient documentation

## 2020-01-15 DIAGNOSIS — Z7901 Long term (current) use of anticoagulants: Secondary | ICD-10-CM | POA: Diagnosis not present

## 2020-01-15 DIAGNOSIS — I509 Heart failure, unspecified: Secondary | ICD-10-CM | POA: Diagnosis not present

## 2020-01-15 DIAGNOSIS — S80211A Abrasion, right knee, initial encounter: Secondary | ICD-10-CM | POA: Insufficient documentation

## 2020-01-15 DIAGNOSIS — Z8679 Personal history of other diseases of the circulatory system: Secondary | ICD-10-CM | POA: Insufficient documentation

## 2020-01-15 DIAGNOSIS — Z794 Long term (current) use of insulin: Secondary | ICD-10-CM | POA: Insufficient documentation

## 2020-01-15 DIAGNOSIS — S80212A Abrasion, left knee, initial encounter: Secondary | ICD-10-CM | POA: Diagnosis not present

## 2020-01-15 DIAGNOSIS — Z7984 Long term (current) use of oral hypoglycemic drugs: Secondary | ICD-10-CM | POA: Insufficient documentation

## 2020-01-15 DIAGNOSIS — W19XXXA Unspecified fall, initial encounter: Secondary | ICD-10-CM

## 2020-01-15 DIAGNOSIS — W010XXA Fall on same level from slipping, tripping and stumbling without subsequent striking against object, initial encounter: Secondary | ICD-10-CM | POA: Insufficient documentation

## 2020-01-15 DIAGNOSIS — M70912 Unspecified soft tissue disorder related to use, overuse and pressure, left shoulder: Secondary | ICD-10-CM | POA: Insufficient documentation

## 2020-01-15 DIAGNOSIS — Y9301 Activity, walking, marching and hiking: Secondary | ICD-10-CM | POA: Insufficient documentation

## 2020-01-15 DIAGNOSIS — S0083XA Contusion of other part of head, initial encounter: Secondary | ICD-10-CM | POA: Insufficient documentation

## 2020-01-15 DIAGNOSIS — Z951 Presence of aortocoronary bypass graft: Secondary | ICD-10-CM | POA: Insufficient documentation

## 2020-01-15 DIAGNOSIS — S4992XA Unspecified injury of left shoulder and upper arm, initial encounter: Secondary | ICD-10-CM

## 2020-01-15 DIAGNOSIS — Z79899 Other long term (current) drug therapy: Secondary | ICD-10-CM | POA: Insufficient documentation

## 2020-01-15 MED ORDER — ACETAMINOPHEN 500 MG PO TABS
1000.0000 mg | ORAL_TABLET | Freq: Once | ORAL | Status: AC
  Administered 2020-01-15: 1000 mg via ORAL
  Filled 2020-01-15: qty 2

## 2020-01-15 NOTE — Discharge Instructions (Addendum)
You were seen in the emergency department after a fall  You reported pain in your shoulder.  X-ray of this area does not show any fractures or dislocations.  It is possible that you may have sustained a contusion or deep bruise or possibly injured other soft tissue structures in your shoulder likely rotator cuff or other ligaments.  Take 500 to 1000 mg of acetaminophen every 6 hours for the next 2 to 3 days.  Wear your shoulder sling for at least 2 to 3 days to rest the joint.  Apply ice at least 3 times daily for 20 minutes at a time.  After 3 days of this start doing light range of motion exercises with your shoulder.  Your pain should improve in the next 7 to 10 days, gradually and slowly.  If your pain and range of motion have not improved after 7 to 10 days please call your primary care doctor and schedule an appointment for reevaluation.  You may need referral to an orthopedic doctor if your pain and range of motion have not improved.  You had a small bruise on your chin.  You are on Plavix and this puts you at risk for internal bleeding.  We discussed the possibility of a CT of your head however given that you had no significant trauma, loss of consciousness or headache we deferred this today.  Please return for severe headache, visual changes, stroke symptoms.

## 2020-01-15 NOTE — ED Triage Notes (Signed)
Patient was walking the parking deck outside when she tripped on a speedbump and fell to the concrete, denies LOC. Complains of left shoulder pain and right knee pain, small abrasion noted to right knee. Reports difficulty moving right arm without severe shoulder pain. Patient alert oriented and in no apparent distress at this time.

## 2020-01-15 NOTE — ED Provider Notes (Signed)
[facility] EMERGENCY DEPARTMENT Provider Note   CSN: [id number] Arrival date & time: 01/15/20  1244     History No chief complaint on file.   [name] is a 80 y.o. female presents to the ED for evaluation of fall that occurred prior to arrival.  Patient states she had parked in hospital parking lot to visit her friend who was hospitalized and tripped.  She thinks she likely tripped on a speed bump in the parking lot.  She fell forward.  She braced her fall with bilateral hands but ended up falling directly on her left shoulder and both knees.  Reports minimal abrasions that are nontender in both knees.  Also has a small bruise on her chin but states apprising me this is not painful at all.  Denies significant headache, visual changes, nausea or vomiting.  No loss of consciousness.  She is on Plavix.  The majority of her pain is in the left shoulder.  This is moderate to severe with any movement.  Pain is localized in the proximal upper arm.  Denies distal extremity tingling or loss of sensation.  No previous injuries or surgeries of the joint.  Denies associated neck pain or chest pain.  Denies any other physical injuries.  Has been ambulatory since the fall.  No interventions.   HPI     Past Medical History:  Diagnosis Date  . Acute colitis 10/14/2018  . Bloody diarrhea 10/13/2018  . CAD (coronary artery disease) 2006   s/p Quadrupal CABG 2006  . CHF (congestive heart failure) ([place]) 2010   EF 30-35% from Echo 06/2008  . CKD (chronic kidney disease) stage 4, GFR 15-29 ml/min (HCC)    fluctuating between stage 3 and 4 depending on GFR  . CKD (chronic kidney disease), stage III ([place]) 09/27/2013  . De Quervain's tenosynovitis, left 11/13/2015  . Diverticulosis   . DIVERTICULOSIS OF COLON 04/01/2007   Qualifier: Diagnosis of  By: Nelson-Smith CMA (AAMA), [name]    . DM (diabetes mellitus) ([place])    insulin dependent  . Dyslipidemia 10/15/2005   Qualifier:  Diagnosis of  By: Derrel Nip MD, Helene Kelp    . History of colonic polyps   . HLD (hyperlipidemia)   . HTN (hypertension)   . Lumbar spinal stenosis   . MI (myocardial infarction) ([place]) 2003   . [place]  . Mouth ulcer adjacent to jaw bone 08/22/2010   S/p removal by ENT 09/2010   . MYOCARDIAL INFARCTION, HX OF 09/09/2006   Qualifier: Diagnosis of  By: Marinda Elk MD, Sonia Side    . Nephrolithiasis    hx of  . NEPHROLITHIASIS 04/01/2007   Qualifier: Diagnosis of  By: Harlon Ditty CMA (AAMA), [name]    . OA (osteoarthritis)   . Osteopenia 2010   T score of -1.8  . Pancreatic cysts, - look benign by MR 04/25/2019  . Psoriasis   . PVD (peripheral vascular disease) (HCC)    ABI indicated moderated reduction arterial flow on the left.  . Tricuspid regurgitation 2009   moderate to severe, EF 55%    Patient Active Problem List   Diagnosis Date Noted  . Healthcare maintenance 12/27/2019  . Chronic insomnia 05/18/2019  . Pancreatic cysts, - look benign by MR 04/25/2019  . Weight loss, non-intentional 03/17/2019  . Need for immunization against influenza 11/18/2018  . Hyperlipidemia 08/05/2018  . Fall 08/04/2018  . GERD (gastroesophageal reflux disease) 01/17/2015  . CKD (chronic kidney disease), stage III ([place]) 09/27/2013  .  Localized, secondary osteoarthritis of the shoulder region 04/19/2013  . Colon cancer screening 12/29/2012  . Mitral valve regurgitation 04/22/2010  . Essential hypertension 09/09/2006  . SYMPTOM, COUGH 09/09/2006  . 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/15/2005  . Peripheral vascular disease ([place]) 10/15/2005  . Psoriasis 10/15/2005  . Osteopenia 10/15/2005  . Type 2 diabetes mellitus with stage 3 chronic kidney disease, with long-term current use of insulin ([place]) 01/14/2004    Past Surgical History:  Procedure Laterality Date  . CATARACT EXTRACTION  2008   left eye  . COLONOSCOPY  2008  . CORONARY ANGIOPLASTY WITH STENT  PLACEMENT  2003  . CORONARY ARTERY BYPASS GRAFT  2006   4 vessel  . LAPAROSCOPIC SUPRACERVICAL HYSTERECTOMY    . PERIPHERAL VASCULAR CATHETERIZATION N/A 07/03/2014   Procedure: Abdominal Aortogram w/Lower Extremity;  Surgeon: Angelia Mould, MD;  Location: [facility];  Service: Cardiovascular;  Laterality: N/A;     OB History   No obstetric history on file.     Family History  Problem Relation Age of Onset  . Cirrhosis Brother   . Heart disease Brother   . Heart attack Brother   . Hypertension Mother   . Hyperlipidemia Mother   . Hyperlipidemia Son   . Hypertension Son     Social History   Tobacco Use  . Smoking status: Never Smoker  . Smokeless tobacco: Never Used  Vaping Use  . Vaping Use: Never used  Substance Use Topics  . Alcohol use: No    Alcohol/week: 0.0 standard drinks  . Drug use: No    Home Medications Prior to Admission medications   Medication Sig Start Date End Date Taking? Authorizing Provider  amLODipine (NORVASC) 10 MG tablet TAKE 1 TABLET(10 MG) BY MOUTH DAILY 11/18/18   [name], MD  aspirin EC 81 MG tablet Take 1 tablet (81 mg total) by mouth daily. 11/18/18   [name], MD  BD PEN NEEDLE NANO 2ND GEN 32G X 4 MM MISC USE TO INJECT TWICE DAILY AS DIRECTED 05/03/19   [name], MD  Blood Glucose Monitoring Suppl (ACCU-CHEK GUIDE ME) w/Device KIT 1 each by Does not apply route 3 (three) times daily. 03/01/18   Lucious Groves, DO  Calcium Carbonate-Vitamin D (CALCIUM 600+D) 600-400 MG-UNIT tablet Take 1 tablet by mouth daily. 11/18/18   [name], MD  clopidogrel (PLAVIX) 75 MG tablet TAKE 1 TABLET(75 MG) BY MOUTH DAILY 11/18/18   [name], MD  famotidine (PEPCID) 20 MG tablet TAKE 1 TABLET BY MOUTH TWICE DAILY 07/19/19   Lucious Groves, DO  furosemide (LASIX) 20 MG tablet Take 1 tablet (20 mg total) by mouth daily. 03/16/19   [name], MD  glucose blood (ACCU-CHEK GUIDE) test strip  Check blood sugar 3 times a day 03/01/18   Lucious Groves, DO  insulin glargine (LANTUS SOLOSTAR) 100 UNIT/ML Solostar Pen Inject 35 Units into the skin at bedtime. 12/27/19   Alexandria Lodge, MD  Lancets (FREESTYLE) lancets USE TO CHECK BLOOD SUGAR FOUR TIMES DAILY- BEFORE MEALS AND EVERY NIGHT AT BEDTIME 03/07/19   [name], MD  nitroGLYCERIN (NITROSTAT) 0.3 MG SL tablet Place 1 tablet (0.3 mg total) under the tongue every 5 (five) minutes as needed for chest pain. 09/14/19   Alexandria Lodge, MD  olmesartan (BENICAR) 5 MG tablet Take 1 tablet (5 mg total) by mouth daily. Patient not taking: Reported on 03/25/2019 03/16/19   [name],  MD  potassium chloride (KLOR-CON 10) 10 MEQ tablet TAKE 1 TABLET(10 MEQ) BY MOUTH TWICE DAILY 07/21/19   [place], Carley D, DO  rosuvastatin (CRESTOR) 20 MG tablet Take 1 tablet (20 mg total) by mouth daily. 01/26/19   [name], MD  sitaGLIPtin ([name]) 25 MG tablet Take 1 tablet (25 mg total) by mouth daily. 06/29/19   [name], MD  Calcium Carb-Cholecalciferol (CALCIUM 600 + D PO) Take 1 tablet by mouth 2 (two) times daily.  11/18/18  [provider]    Allergies    Valsartan  Review of Systems   Review of Systems  Musculoskeletal: Positive for arthralgias.  Skin: Positive for color change (bruise, abrasions).  Hematological: Bruises/bleeds easily (plavix).  All other systems reviewed and are negative.   Physical Exam Updated Vital Signs BP 125/65   Pulse 75   Temp 99 F (37.2 C)   Resp 18   Ht 5' 1"  (1.549 m)   Wt 52.3 kg   SpO2 100%   BMI 21.79 kg/m   Physical Exam Vitals and nursing note reviewed.  Constitutional:      General: She is not in acute distress.    Appearance: She is well-developed and well-nourished.     Comments: NAD.  HENT:     Head: Normocephalic.     Comments: Small round ecchymosis underneath chin approximate size of a quarter.  No significant tenderness.  Skin intact without  abrasions or lacerations.  No other facial or scalp bone tenderness.    Right Ear: External ear normal.     Left Ear: External ear normal.     Nose: Nose normal.     Mouth/Throat:     Comments: Patient has upper and lower dentures.  Lower frontal gingiva with slight abrasion, hemostatic.  No lacerations.  Dentures still fit patient appropriately. Eyes:     General: No scleral icterus.    Extraocular Movements: EOM normal.     Conjunctiva/sclera: Conjunctivae normal.  Neck:     Comments: No midline or paraspinal muscular tenderness.  Full range of motion of the neck without pain. Cardiovascular:     Rate and Rhythm: Normal rate and regular rhythm.     Heart sounds: Normal heart sounds. No murmur heard.     Comments: 1+ radial pulses bilaterally.  1+ DP pulses bilaterally. Pulmonary:     Effort: Pulmonary effort is normal.     Breath sounds: Normal breath sounds. No wheezing.     Comments: No anterior, lateral or posterior thorax tenderness. Abdominal:     Palpations: Abdomen is soft.     Tenderness: There is no abdominal tenderness.  Musculoskeletal:        General: Tenderness present. No deformity. Normal range of motion.     Cervical back: Normal range of motion and neck supple.     Comments: Focal tenderness in the proximal humerus and lateral deltoid.  Skin normal over this area.  No focal bony tenderness over the left anterior/lateral or posterior thorax.  No focal bony tenderness over the [facility] [place] joint, clavicle, scapula.  Decreased abduction and flexion of the shoulder secondary to pain, patient unable to lift shoulder past 90 degrees due to pain.  No focal bony tenderness of the elbow, wrist.  Full range of motion of the elbow and wrist without any pain.  No wrist tenderness.  Skin:    General: Skin is warm and dry.     Capillary Refill: Capillary refill takes less than 2 seconds.  Neurological:     Mental Status: She is alert and oriented to person, place, and time.      Comments: Sensation to light touch and strength intact in the left radial, medial and ulnar nerve distribution  Psychiatric:        Mood and Affect: Mood and affect normal.        Behavior: Behavior normal.        Thought Content: Thought content normal.        Judgment: Judgment normal.     ED Results / Procedures / Treatments   Labs (all labs ordered are listed, but only abnormal results are displayed) Labs Reviewed - No data to display  EKG None  Radiology DG Shoulder Left  Result Date: 01/15/2020 CLINICAL DATA:  Fall, pain EXAM: LEFT SHOULDER - 2+ VIEW COMPARISON:  None. FINDINGS: No acute bony abnormality. Specifically, no fracture, subluxation, or dislocation. IMPRESSION: No acute bony abnormality. Electronically Signed   By: Rolm Baptise M.D.   On: 01/15/2020 13:37    Procedures Procedures (including critical care time)  Medications Ordered in ED Medications  acetaminophen (TYLENOL) tablet 1,000 mg (1,000 mg Oral Given 01/15/20 1844)    ED Course  I have reviewed the triage vital signs and the nursing notes.  Pertinent labs & imaging results that were available during my care of the patient were reviewed by me and considered in my medical decision making (see chart for details).    MDM Rules/Calculators/A&P                          80 year old female presents to the ED for after mechanical fall.  Has nontender bruise on her chin and very small nontender abrasions on bilateral knees.  The majority of her pain is in her left shoulder/humerus.  X-ray obtained in triage was personally visualized and interpreted, no acute bony injuries or dislocations.  Suspect contusion versus soft tissue injury like rotator cuff injury or other ligamentous injury.  Extremities neurovascularly intact.  Compartments are soft.  Will place in shoulder immobilizer and recommend high-dose Tylenol, ice, rest.  Recommended early range of motion exercises of the joint to prevent stiffness and  weakness.  Will recommend PCP follow-up for reevaluation if pain and range of motion do not improve.  May need orthopedic referral for persistent symptoms.  She is on Plavix with chin bruise.  Considered head CT given anticoagulant use however patient has no headache, visual changes or any neuro complaints.  Discussed with patient that intracranial hemorrhage from the fall is possible but given her symptoms and exam unlikely.  She felt comfortable deferring head CT.  I think this is reasonable.  Return precautions discussed.  Shared with EDP who also evaluated patient and agrees with ER treatment and disposition. Final Clinical Impression(s) / ED Diagnoses Final diagnoses:  Fall, initial encounter  Contusion of chin, initial encounter  Abrasion of knee, bilateral  Soft tissue injury of left shoulder, initial encounter    Rx / DC Orders ED Discharge Orders    None       Arlean Hopping 01/15/20 Darlin Drop    Lajean Saver, MD 01/15/20 2050

## 2020-01-15 NOTE — ED Notes (Signed)
Patient verbalizes understanding of discharge instructions. Opportunity for questioning and answers were provided. Armband removed by staff, pt discharged from ED ambulatory.   

## 2020-01-24 ENCOUNTER — Encounter: Payer: Medicare Other | Admitting: Pharmacist

## 2020-01-31 ENCOUNTER — Encounter: Payer: Medicare Other | Admitting: Pharmacist

## 2020-02-03 ENCOUNTER — Encounter: Payer: Self-pay | Admitting: Pharmacist

## 2020-02-03 ENCOUNTER — Ambulatory Visit (INDEPENDENT_AMBULATORY_CARE_PROVIDER_SITE_OTHER): Payer: Medicare Other | Admitting: Pharmacist

## 2020-02-03 ENCOUNTER — Other Ambulatory Visit: Payer: Self-pay

## 2020-02-03 DIAGNOSIS — Z951 Presence of aortocoronary bypass graft: Secondary | ICD-10-CM

## 2020-02-03 DIAGNOSIS — M8580 Other specified disorders of bone density and structure, unspecified site: Secondary | ICD-10-CM

## 2020-02-03 DIAGNOSIS — N1831 Chronic kidney disease, stage 3a: Secondary | ICD-10-CM

## 2020-02-03 DIAGNOSIS — I739 Peripheral vascular disease, unspecified: Secondary | ICD-10-CM

## 2020-02-03 DIAGNOSIS — M19219 Secondary osteoarthritis, unspecified shoulder: Secondary | ICD-10-CM

## 2020-02-03 DIAGNOSIS — M479 Spondylosis, unspecified: Secondary | ICD-10-CM

## 2020-02-03 DIAGNOSIS — L409 Psoriasis, unspecified: Secondary | ICD-10-CM

## 2020-02-03 DIAGNOSIS — I10 Essential (primary) hypertension: Secondary | ICD-10-CM

## 2020-02-03 DIAGNOSIS — K219 Gastro-esophageal reflux disease without esophagitis: Secondary | ICD-10-CM

## 2020-02-03 DIAGNOSIS — I5032 Chronic diastolic (congestive) heart failure: Secondary | ICD-10-CM

## 2020-02-03 DIAGNOSIS — E782 Mixed hyperlipidemia: Secondary | ICD-10-CM

## 2020-02-03 DIAGNOSIS — I340 Nonrheumatic mitral (valve) insufficiency: Secondary | ICD-10-CM

## 2020-02-03 NOTE — Progress Notes (Signed)
S/O:  Patient arrives in good spirits with husband.  Presents for medication management.   Patient brought in a bag of all of the medications she currently takes for review during appointment, as well as her Accuchek glucometer. Patient states she uses a pill organizer to keep track of her medications. She writes down on the bottle what she takes some of her medications for. When questioned, she knows why she takes some of her medications, but others she "has no idea."  Medication Adherence Questionnaire (A score of 2 or more points indicates risk for nonadherence)  Do you know what each of your medicines is for? 1 (1 point if no)  Do you ever have trouble remembering to take your medicine? 0 (2 points if yes)  Do you ever not take a medicine because you feel you do not need it?  0 (1 point if yes)  Do you think that any of your medicines is not helping you? 0 (1 point if yes)  Do you have any physical problems such as vision loss that keep you from taking your medicines as prescribed?  0 (2 points if yes)  Do you think any of your medicine is causing a side effect?  0 (1 point if yes)  Do you know the names of ALL of your medicines? 0 (1 point if no)  Do you think that you need ALL of your medicines? 0 (1 point if no)  In the past 6 months, have you missed getting a refill or a new prescription filled on time? 0 (1 point if yes)  How often do you miss taking a dose of medicine?  0 Never (0 points), 1 or 2 times a month (0 points), 1 time a week (2 points), 2 or more times a week (2 points).   TOTAL SCORE 1   Glucometer readings (fasting only): 7 day avg: 146 14 day avg: 133 30 day avg: 132 90 day avg: 129  Patient reports a few low blood glucose readings, with one in the 50's. Upon review of last 45 days patient's lowest reading was 63 and she had a couple additional readings in the 70's. States she knows what it feels like for her when she is hypoglycemic and she will eat cereal that is  higher in sugar (did not provide type). She typically eats a snack at night and attributes hypoglycemic events to likely not having eaten a snack the night before. She had only a couple values in the low 200's and attributes this to likely eating ice cream for her evening snack.  Patient did not bring in recorded blood pressure readings but states she did have one reading recently that she believes was around 141/101. She states from what she can recall typically her blood pressure seems to be around 130-140's/80-90's.   A/P: 1. Medication Adherence: Patient has no known adherence challenges. Based on above score of 1 patient is very adherent to medication. Spent majority of appointment reviewing the purpose of each medication with the patient.  2. Medication Reconciliation: medication list reviewed and updated. Order for BMP put in as potassium should be re-checked to determine continued use of potassium supplement.  3. T2DM: Controlled based on patient's glucometer readings. Recommended for patient to have less stringent A1C goal to avoid pushing the patient towards more frequent hypoglycemic events. At this time only recommendation is to switch patient from DPP4 ([name]) to SGLT2 with renal benefits. Will check patient's formulary for coverage of SGLT2 and then  make recommendation to provider. Discussed renal and CV benefits of SGLT2 with patient based on her medical history and patient was agreeable to switch. For time being patient will continue [name].  4. Hypertension: Uncontrolled based on patient's memory of home readings and past in-clinic readings. Will follow-up with patient during upcoming phone call to gather exact recorded home blood pressure readings. At that time may need to recommend further hypertension management.   Written patient instructions provided.  Total time in face to face counseling 45 minutes.   Follow up phone call with Pharmacist Clinic upon review of SGLT2  coverage.  Discussed patient and recommendations with Dr. Dareen Piano.

## 2020-02-03 NOTE — Patient Instructions (Addendum)
[name] it was a pleasure seeing you today.   Please do the following:  1. Continue taking your current medications as directed today during your appointment. If you have any questions or if you believe something has occurred because of this change, call me or your doctor to let one of [place] know.  2. Continue checking blood sugars at home. It's really important that you record these and bring these in to your next doctor's appointment.  3. Take one tablet of your current calcium and vitamin D bottle and then take an additional 600 mg of Calcium supplementation in addition to your current bottle.   Hypoglycemia or low blood sugar:   Low blood sugar can happen quickly and may become an emergency if not treated right away.   While this shouldn't happen often, it can be brought upon if you skip a meal or do not eat enough. Also, if your insulin or other diabetes medications are dosed too high, this can cause your blood sugar to go to low.   Warning signs of low blood sugar include: 1. Feeling shaky or dizzy 2. Feeling weak or tired  3. Excessive hunger 4. Feeling anxious or upset  5. Sweating even when you aren't exercising  What to do if I experience low blood sugar? Follow the Rule of 15 1. Check your blood sugar with your meter. If lower than 70, proceed to step 2.  2. Treat with 15 grams of fast acting carbs which is found in 3-4 glucose tablets. If none are available you can try hard candy, 1 tablespoon of sugar or honey,4 ounces of fruit juice, or 6 ounces of REGULAR soda.  3. Re-check your sugar in 15 minutes. If it is still below 70, do what you did in step 2 again. If your blood sugar has come back up, go ahead and eat a snack or small meal made up of complex carbs (ex. Whole grains) and protein at this time to avoid recurrence of low blood sugar.  We discussed switching you to a medication called [name] or [name]. I will follow-up with you regarding this via phone-call. In the  meantime continue to take your [name].

## 2020-02-04 LAB — BMP8+ANION GAP
Anion Gap: 15 mmol/L (ref 10.0–18.0)
BUN/Creatinine Ratio: 14 (ref 12–28)
BUN: 17 mg/dL (ref 8–27)
CO2: 26 mmol/L (ref 20–29)
Calcium: 10.5 mg/dL — ABNORMAL HIGH (ref 8.7–10.3)
Chloride: 98 mmol/L (ref 96–106)
Creatinine, Ser: 1.2 mg/dL — ABNORMAL HIGH (ref 0.57–1.00)
GFR calc Af Amer: 49 mL/min/{1.73_m2} — ABNORMAL LOW (ref >59)
GFR calc non Af Amer: 42 mL/min/{1.73_m2} — ABNORMAL LOW (ref >59)
Glucose: 153 mg/dL — ABNORMAL HIGH (ref 65–99)
Potassium: 4.6 mmol/L (ref 3.5–5.2)
Sodium: 139 mmol/L (ref 134–144)

## 2020-02-07 MED ORDER — FUROSEMIDE 20 MG PO TABS: 20.0000 mg | ORAL_TABLET | Freq: Every day | ORAL | 3 refills | Status: DC

## 2020-02-07 MED ORDER — SITAGLIPTIN PHOSPHATE 25 MG PO TABS: 25.0000 mg | ORAL_TABLET | Freq: Every day | ORAL | 3 refills | Status: DC

## 2020-02-07 MED ORDER — POTASSIUM CHLORIDE ER 10 MEQ PO TBCR: EXTENDED_RELEASE_TABLET | ORAL | 1 refills | Status: DC

## 2020-02-07 MED ORDER — LANTUS SOLOSTAR 100 UNIT/ML ~~LOC~~ SOPN: 35.0000 [IU] | PEN_INJECTOR | Freq: Every day | SUBCUTANEOUS | 5 refills | Status: DC

## 2020-02-07 NOTE — Addendum Note (Signed)
Addended by: Aldine Contes on: 02/07/2020 09:51 AM   Modules accepted: Orders

## 2020-02-08 ENCOUNTER — Other Ambulatory Visit: Payer: Self-pay

## 2020-02-08 DIAGNOSIS — K219 Gastro-esophageal reflux disease without esophagitis: Secondary | ICD-10-CM

## 2020-02-08 MED ORDER — FAMOTIDINE 20 MG PO TABS: 20.0000 mg | ORAL_TABLET | Freq: Two times a day (BID) | ORAL | 1 refills | Status: DC

## 2020-02-19 ENCOUNTER — Other Ambulatory Visit: Payer: Self-pay | Admitting: Internal Medicine

## 2020-02-19 DIAGNOSIS — Z794 Long term (current) use of insulin: Secondary | ICD-10-CM

## 2020-02-19 DIAGNOSIS — I5032 Chronic diastolic (congestive) heart failure: Secondary | ICD-10-CM

## 2020-02-19 DIAGNOSIS — N1831 Chronic kidney disease, stage 3a: Secondary | ICD-10-CM

## 2020-02-19 MED ORDER — LANTUS SOLOSTAR 100 UNIT/ML ~~LOC~~ SOPN: 35.0000 [IU] | PEN_INJECTOR | Freq: Every day | SUBCUTANEOUS | 5 refills | Status: DC

## 2020-02-19 MED ORDER — EMPAGLIFLOZIN 10 MG PO TABS: 10.0000 mg | ORAL_TABLET | Freq: Every day | ORAL | 1 refills | Status: DC

## 2020-02-19 MED ORDER — FREESTYLE LANCETS MISC
5 refills | Status: AC
Start: 2020-02-19 — End: ?

## 2020-02-19 MED ORDER — FUROSEMIDE 20 MG PO TABS: 20.0000 mg | ORAL_TABLET | Freq: Every day | ORAL | 3 refills | Status: DC

## 2020-02-19 MED ORDER — BD PEN NEEDLE NANO 2ND GEN 32G X 4 MM MISC: 1 refills | Status: DC

## 2020-02-19 MED ORDER — POTASSIUM CHLORIDE ER 10 MEQ PO TBCR: EXTENDED_RELEASE_TABLET | ORAL | 1 refills | Status: DC

## 2020-02-19 NOTE — Progress Notes (Signed)
At the request of [facility] pharmacist, the following medications have been refilled: Lantus, potassium, furosemide, and pen needles. Furthermore, potassium was changed to once daily. And [name] 10 mg was started and [name] was discontinued.  Patient will need a follow up appointment in the next 4-6 weeks to titrate up her [place], D.O.  Internal Medicine Resident, PGY-2 [facility]  Pager: [phone] 10:29 AM, 02/19/2020

## 2020-02-20 NOTE — Progress Notes (Signed)
Good Morning.  Spoke with the patient and she has been sch with Dr. Shon Baton for 04/03/2020.

## 2020-03-05 ENCOUNTER — Other Ambulatory Visit: Payer: Self-pay | Admitting: *Deleted

## 2020-03-05 DIAGNOSIS — I739 Peripheral vascular disease, unspecified: Secondary | ICD-10-CM

## 2020-03-05 DIAGNOSIS — I10 Essential (primary) hypertension: Secondary | ICD-10-CM

## 2020-03-05 MED ORDER — CLOPIDOGREL BISULFATE 75 MG PO TABS
ORAL_TABLET | ORAL | 3 refills | Status: DC
Start: 2020-03-05 — End: 2021-01-18

## 2020-03-05 MED ORDER — AMLODIPINE BESYLATE 10 MG PO TABS: ORAL_TABLET | ORAL | 3 refills | Status: DC

## 2020-03-05 NOTE — Telephone Encounter (Signed)
Patient called in requesting 90 day supply refills on amlodipine and plavix at [facility]. Confirmed next appt for 04/03/2020 at 1015. Hubbard Hartshorn, BSN, RN-BC

## 2020-03-27 ENCOUNTER — Other Ambulatory Visit: Payer: Self-pay | Admitting: Internal Medicine

## 2020-03-27 DIAGNOSIS — Z1231 Encounter for screening mammogram for malignant neoplasm of breast: Secondary | ICD-10-CM

## 2020-04-03 ENCOUNTER — Ambulatory Visit (INDEPENDENT_AMBULATORY_CARE_PROVIDER_SITE_OTHER): Payer: Medicare Other | Admitting: Student

## 2020-04-03 ENCOUNTER — Encounter: Payer: Self-pay | Admitting: Student

## 2020-04-03 VITALS — BP 141/60 | HR 63 | Temp 98.4°F | Wt 116.3 lb

## 2020-04-03 DIAGNOSIS — I10 Essential (primary) hypertension: Secondary | ICD-10-CM | POA: Diagnosis not present

## 2020-04-03 DIAGNOSIS — Z951 Presence of aortocoronary bypass graft: Secondary | ICD-10-CM

## 2020-04-03 DIAGNOSIS — N1831 Chronic kidney disease, stage 3a: Secondary | ICD-10-CM

## 2020-04-03 DIAGNOSIS — K219 Gastro-esophageal reflux disease without esophagitis: Secondary | ICD-10-CM

## 2020-04-03 DIAGNOSIS — E1122 Type 2 diabetes mellitus with diabetic chronic kidney disease: Secondary | ICD-10-CM | POA: Diagnosis not present

## 2020-04-03 DIAGNOSIS — E785 Hyperlipidemia, unspecified: Secondary | ICD-10-CM

## 2020-04-03 DIAGNOSIS — I5032 Chronic diastolic (congestive) heart failure: Secondary | ICD-10-CM | POA: Diagnosis not present

## 2020-04-03 DIAGNOSIS — W19XXXA Unspecified fall, initial encounter: Secondary | ICD-10-CM

## 2020-04-03 DIAGNOSIS — M19219 Secondary osteoarthritis, unspecified shoulder: Secondary | ICD-10-CM

## 2020-04-03 DIAGNOSIS — Z794 Long term (current) use of insulin: Secondary | ICD-10-CM | POA: Diagnosis not present

## 2020-04-03 LAB — GLUCOSE, CAPILLARY: Glucose-Capillary: 215 mg/dL — ABNORMAL HIGH (ref 70–99)

## 2020-04-03 LAB — POCT GLYCOSYLATED HEMOGLOBIN (HGB A1C): Hemoglobin A1C: 7.9 % — AB (ref 4.0–5.6)

## 2020-04-03 MED ORDER — FUROSEMIDE 20 MG PO TABS: 20.0000 mg | ORAL_TABLET | Freq: Every day | ORAL | 3 refills | Status: DC

## 2020-04-03 MED ORDER — EMPAGLIFLOZIN 10 MG PO TABS: 10.0000 mg | ORAL_TABLET | Freq: Every day | ORAL | 1 refills | Status: DC

## 2020-04-03 MED ORDER — LANTUS SOLOSTAR 100 UNIT/ML ~~LOC~~ SOPN: 35.0000 [IU] | PEN_INJECTOR | Freq: Every day | SUBCUTANEOUS | 5 refills | Status: DC

## 2020-04-03 MED ORDER — BD PEN NEEDLE NANO 2ND GEN 32G X 4 MM MISC: 1 refills | Status: DC

## 2020-04-03 MED ORDER — ROSUVASTATIN CALCIUM 20 MG PO TABS: 20.0000 mg | ORAL_TABLET | Freq: Every day | ORAL | 3 refills | Status: DC

## 2020-04-03 MED ORDER — OLMESARTAN MEDOXOMIL 5 MG PO TABS: 5.0000 mg | ORAL_TABLET | Freq: Every day | ORAL | 3 refills | Status: DC

## 2020-04-03 NOTE — Assessment & Plan Note (Signed)
BP 154/62 -> 141/60 on repeat. Denies headaches, blurry vision, dizziness at rest or upon standing. She states she does not think the 2 recent falls she had were related to being dizzy or lightheaded but rather she tripped/misstepped. She states she does not measure her BP at home.  After our last clinic visit on 12/27/19 had her follow-up with our clinical pharmacist, Dr. Georgina Peer, who was able to confirm the patient has good medication understanding and adherence and has been taking amlodipine 10 mg daily and olmesartan 5 mg daily as prescribed.  A/P: Patient is very near her goal BP of <140/80, and given her age and risk for falls, will not make adjustments to her antihypertensives. - continue amlodipine 10 mg daily - continue olmesartan 5 mg daily

## 2020-04-03 NOTE — Progress Notes (Signed)
   CC: follow-up of DM, HTN; shoulder pain  HPI:  [name] is a 80 y.o. woman with history as below who presents to clinic for the above chief complaints. Her last clinic visit was on 12/27/19. She saw the [facility] clinical pharmacist, Dr. Georgina Peer, on 02/03/20.    To see the details of this patient's management of their acute and chronic problems, please refer to the Assessment & Plan under the Encounters tab.    Past Medical History:  Diagnosis Date  . Acute colitis 10/14/2018  . Bloody diarrhea 10/13/2018  . CAD (coronary artery disease) 2006   s/p Quadrupal CABG 2006  . CHF (congestive heart failure) ([place]) 2010   EF 30-35% from Echo 06/2008  . CKD (chronic kidney disease) stage 4, GFR 15-29 ml/min (HCC)    fluctuating between stage 3 and 4 depending on GFR  . CKD (chronic kidney disease), stage III ([place]) 09/27/2013  . De Quervain's tenosynovitis, left 11/13/2015  . Diverticulosis   . DIVERTICULOSIS OF COLON 04/01/2007   Qualifier: Diagnosis of  By: Nelson-Smith CMA (AAMA), [name]    . DM (diabetes mellitus) ([place])    insulin dependent  . Dyslipidemia 10/15/2005   Qualifier: Diagnosis of  By: Derrel Nip MD, Helene Kelp    . History of colonic polyps   . HLD (hyperlipidemia)   . HTN (hypertension)   . Lumbar spinal stenosis   . MI (myocardial infarction) ([place]) 2003   . [place]  . Mouth ulcer adjacent to jaw bone 08/22/2010   S/p removal by ENT 09/2010   . MYOCARDIAL INFARCTION, HX OF 09/09/2006   Qualifier: Diagnosis of  By: Marinda Elk MD, Sonia Side    . Nephrolithiasis    hx of  . NEPHROLITHIASIS 04/01/2007   Qualifier: Diagnosis of  By: Harlon Ditty CMA (AAMA), [name]    . OA (osteoarthritis)   . Osteopenia 2010   T score of -1.8  . Pancreatic cysts, - look benign by MR 04/25/2019  . Psoriasis   . PVD (peripheral vascular disease) (HCC)    ABI indicated moderated reduction arterial flow on the left.  . Tricuspid regurgitation 2009   moderate to severe, EF 55%   Review of  Systems:    Review of Systems  Constitutional: Negative for chills and fever.  Respiratory: Negative for shortness of breath.   Cardiovascular: Negative for chest pain.  Musculoskeletal: Positive for falls and joint pain. Negative for back pain.  Neurological: Negative for dizziness, weakness and headaches.    Physical Exam:  Vitals:   04/03/20 1013 04/03/20 1044  BP: (!) 154/62 (!) 141/60  Pulse: 72 63  Temp: 98.4 F (36.9 C)   TempSrc: Oral   SpO2: 100%   Weight: 116 lb 4.8 oz (52.8 kg)    Constitutional: well-appearing woman sitting in chair, in no acute distress HENT: normocephalic atraumatic, mucous membranes moist Eyes: conjunctiva non-erythematous Neck: supple Cardiovascular: regular rate and rhythm, no m/r/g Pulmonary/Chest: normal work of breathing on room air, lungs clear to auscultation bilaterally Abdominal: soft, non-distended MSK: ROM at the shoulders reduced bilaterally to <180 degrees, pain with active and passive ROM; negative empty can test. Neuro intact distally. Neurological: alert & oriented x 3, 5/5 strength in bilateral upper extremities, slightly antalgic gait Skin: warm and dry; left second toe slightly swollen and warm to touch Psych: normal mood and affect   Assessment & Plan:   See Encounters Tab for problem-based charting.  Patient discussed with Dr. Evette Doffing

## 2020-04-03 NOTE — Assessment & Plan Note (Signed)
Hemoglobin A1c 7.9% today (7.7% 3 months ago, 7.3% 6 months ago).  After meeting with our clinical pharmacist, Dr. Georgina [name], on 02/03/20, [name] was stopped, and she was started on [name] (SGLT2i) 10 mg daily on 02/19/20. This was based on goal of avoiding hypoglycemic events as well as having renal benefits.   Glucometer reviewed. She checks her blood sugar most every morning with most in range, lowest 74. Patient denies hypoglycemic events.  Assessment/Plan: A1c 7.9% today. Given her age and other comorbidities, she is at goal with A1c of <8.0%. - continue Lantus 35 units daily - continue [name] 10 mg daily - continue olmesartan 5 mg daily - counseled patient to continue to monitor closely for lows - RTC in 3 months for DM follow-up and A1c

## 2020-04-03 NOTE — Assessment & Plan Note (Addendum)
Patient reports recent exacerbation of her bilateral shoulder pain. Had a fall ~2 months ago early January where she fell onto her L side/shoulder after tripping on a speed bump in the hospital parking lot. She had repeat shoulder xray which was negative for fracture, and she was placed in a sling. She notes ongoing discomfort in the L shoulder which radiates down to her forearm associated with persisted limited range of motion. She also reports ~3 months ago, prior to the fall in the parking lot, she was changing the sheets on her bed, heard and felt a pop, and had worsening pain in her R shoulder also associated with persistent limited range of motion. The pain in the R shoulder radiates around to her R scapula.   She reports she was seen by orthopedic surgeon, Dr. Onnie Graham, back in the fall and received a steroid injection in the R shoulder, however this preceded these recent injuries and did not help much with her discomfort at the time.  Exam today significant for continued limited shoulder ROM and pain.  Assessment/Plan: 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 Discussed that the next best step would be follow-up with Dr. Onnie Graham (orthopedic surgeon) to discuss surgical options. Patient seems reluctant to consider surgery but is amenable to seeing Dr. Onnie Graham again.  - Advised patient to schedule follow-up with Dr. Onnie Graham (orthopedic surgery) - Continue Tylenol PRN

## 2020-04-03 NOTE — Patient Instructions (Signed)
[name],   Thank you for your visit to the [facility] today. It was a pleasure seeing you. Today we discussed the following:  1) Diabetes - Your hemoglobin A1c was 7.9%, which is at goal for you.  - Continue your [name] 10 mg daily and Lantus 35 units daily.  2) Hypertension - Your blood pressure was stable today.  - Continue taking your amlodipine 10 mg daily and olmesartan 5 mg daily  3) Recent falls, shoulder pain, left toe pain - Take extra care to watch your step! - I'm glad your toe pain is feeling much better. Continue to rest and elevate. Let us know if it does not get any better. - For your shoulder pain, I would like you to schedule a follow-up appointment with Dr. Onnie Graham to hear about further options, which would most likely include surgery.  I would like to see you back in 3 months (06/2020). Please bring all of your medications and your glucometer with you!   If you have any questions or concerns, please call our clinic at [phone] between 9am-5pm. Outside of these hours, call [phone] and ask for the internal medicine resident on call. If you feel you are having a medical emergency please call 911.

## 2020-04-03 NOTE — Assessment & Plan Note (Signed)
As discussed in the problem-based A&P for OA of the shoulder region, [name] had 2 recent falls. One was in early January when she tripped on a speed bump in the hospital parking lot and landed on her L shoulder. Subsequent shoulder imaging was negative for fracture and she was placed in a sling. Then last week she reports she missed a small step at her daughter's house and landed on her knee and her L toes bent sharply. She denies preceding dizziness or lightheadedness, did not hit her head or lose consciousness. On exam, she has mild swelling and erythema of the second left toe. Gait is slightly antalgic, but the patient reports the pain and swelling are improving.  Plan: - continue to monitor - counseled patient to contact us if the pain does not continue to improve

## 2020-04-04 NOTE — Progress Notes (Signed)
Internal Medicine Clinic Attending  Case discussed with Dr. Watson  At the time of the visit.  We reviewed the resident's history and exam and pertinent patient test results.  I agree with the assessment, diagnosis, and plan of care documented in the resident's note.  

## 2020-04-26 ENCOUNTER — Other Ambulatory Visit: Payer: Self-pay

## 2020-04-26 DIAGNOSIS — N183 Chronic kidney disease, stage 3 unspecified: Secondary | ICD-10-CM

## 2020-04-26 DIAGNOSIS — E1122 Type 2 diabetes mellitus with diabetic chronic kidney disease: Secondary | ICD-10-CM

## 2020-04-26 MED ORDER — ACCU-CHEK GUIDE VI STRP: ORAL_STRIP | 3 refills | Status: DC

## 2020-04-26 NOTE — Telephone Encounter (Signed)
  glucose blood (ACCU-CHEK GUIDE) test strip, REFILL REQUEST @  [id number] [address], [place] AT [place] Phone:  [phone]  Fax:  [phone]

## 2020-05-17 ENCOUNTER — Other Ambulatory Visit: Payer: Self-pay

## 2020-05-17 ENCOUNTER — Ambulatory Visit
Admission: RE | Admit: 2020-05-17 | Discharge: 2020-05-17 | Disposition: A | Discharge status: Home / Self Care | Payer: Medicare Other | Source: Ambulatory Visit | Attending: Internal Medicine | Admitting: Internal Medicine

## 2020-05-17 DIAGNOSIS — Z1231 Encounter for screening mammogram for malignant neoplasm of breast: Secondary | ICD-10-CM

## 2020-05-29 ENCOUNTER — Other Ambulatory Visit: Payer: Self-pay | Admitting: *Deleted

## 2020-05-29 DIAGNOSIS — K219 Gastro-esophageal reflux disease without esophagitis: Secondary | ICD-10-CM

## 2020-05-29 MED ORDER — FAMOTIDINE 20 MG PO TABS: 20.0000 mg | ORAL_TABLET | Freq: Two times a day (BID) | ORAL | 1 refills | Status: DC

## 2020-06-09 ENCOUNTER — Encounter: Payer: Self-pay | Admitting: *Deleted

## 2020-06-13 ENCOUNTER — Encounter: Payer: Self-pay | Admitting: Internal Medicine

## 2020-06-15 ENCOUNTER — Encounter: Payer: Self-pay | Admitting: Student

## 2020-06-19 LAB — HM DIABETES EYE EXAM

## 2020-06-20 ENCOUNTER — Encounter: Payer: Self-pay | Admitting: *Deleted

## 2020-07-04 ENCOUNTER — Ambulatory Visit (INDEPENDENT_AMBULATORY_CARE_PROVIDER_SITE_OTHER): Payer: Medicare Other | Admitting: Student

## 2020-07-04 VITALS — BP 154/50 | HR 64 | Temp 98.4°F | Ht 59.0 in | Wt 114.9 lb

## 2020-07-04 DIAGNOSIS — I10 Essential (primary) hypertension: Secondary | ICD-10-CM

## 2020-07-04 DIAGNOSIS — E1122 Type 2 diabetes mellitus with diabetic chronic kidney disease: Secondary | ICD-10-CM | POA: Diagnosis not present

## 2020-07-04 DIAGNOSIS — N1831 Chronic kidney disease, stage 3a: Secondary | ICD-10-CM

## 2020-07-04 DIAGNOSIS — K219 Gastro-esophageal reflux disease without esophagitis: Secondary | ICD-10-CM

## 2020-07-04 DIAGNOSIS — Z951 Presence of aortocoronary bypass graft: Secondary | ICD-10-CM

## 2020-07-04 DIAGNOSIS — Z794 Long term (current) use of insulin: Secondary | ICD-10-CM | POA: Diagnosis not present

## 2020-07-04 DIAGNOSIS — I5032 Chronic diastolic (congestive) heart failure: Secondary | ICD-10-CM | POA: Diagnosis not present

## 2020-07-04 DIAGNOSIS — N1832 Chronic kidney disease, stage 3b: Secondary | ICD-10-CM

## 2020-07-04 LAB — POCT GLYCOSYLATED HEMOGLOBIN (HGB A1C): Hemoglobin A1C: 7.9 % — AB (ref 4.0–5.6)

## 2020-07-04 LAB — GLUCOSE, CAPILLARY: Glucose-Capillary: 207 mg/dL — ABNORMAL HIGH (ref 70–99)

## 2020-07-04 MED ORDER — PANTOPRAZOLE SODIUM 20 MG PO TBEC: 20.0000 mg | DELAYED_RELEASE_TABLET | Freq: Every day | ORAL | 0 refills | Status: DC

## 2020-07-04 MED ORDER — POTASSIUM CHLORIDE ER 10 MEQ PO TBCR: EXTENDED_RELEASE_TABLET | ORAL | 1 refills | Status: DC

## 2020-07-04 MED ORDER — EMPAGLIFLOZIN 10 MG PO TABS: 10.0000 mg | ORAL_TABLET | Freq: Every day | ORAL | 1 refills | Status: DC

## 2020-07-04 MED ORDER — NITROGLYCERIN 0.3 MG SL SUBL: 0.3000 mg | SUBLINGUAL_TABLET | SUBLINGUAL | 12 refills | Status: DC | PRN

## 2020-07-04 NOTE — Progress Notes (Addendum)
Office Visit   Patient ID: [name], female    DOB: 10-Jan-1941, 80 y.o.   MRN: [id number]   PCP: Alexandria Lodge, MD   Subjective:   CC: DM, HTN follow-up  HPI:  [name] is a 80 y.o. woman with history as below who presents to clinic for 40-monthfollow-up of her chronic medical conditions. Her last clinic visit was on 04/03/20.   To see the details of this patient's management of their acute and chronic problems, please refer to the Assessment & Plan under the Encounters tab.   Review of Systems:   Review of Systems  Constitutional:  Negative for malaise/fatigue.  Eyes:  Negative for blurred vision.  Respiratory:  Negative for shortness of breath.   Cardiovascular:  Negative for chest pain and leg swelling.  Neurological:  Negative for dizziness and headaches.   Past Medical History:  Diagnosis Date   Acute colitis 10/14/2018   Bloody diarrhea 10/13/2018   CAD (coronary artery disease) 2006   s/p Quadrupal CABG 2006   [facility] [place]   EF 30-35% from Echo 06/2008   CKD (chronic kidney disease) stage 4, GFR 15-29 ml/min (HCC)    fluctuating between stage 3 and 4 depending on GFR   CKD (chronic kidney disease), stage III ([facility] 09/27/2013   De Quervain's tenosynovitis, left 11/13/2015   Diverticulosis    DIVERTICULOSIS OF COLON 04/01/2007   Qualifier: Diagnosis of  By: NHarlon DittyCMA (AAMA), [name]     [place]abetes mellitus) [place]    insulin dependent   Dyslipidemia 10/15/2005   Qualifier: Diagnosis of  By: TDerrel NipMD, THelene Kelp    History of colonic polyps    HLD (hyperlipidemia)    HTN (hypertension)    Lumbar spinal stenosis    [place] ([place] infarction) [place] [place]   [place] [facility]   Mouth ulcer adjacent to jaw bone 08/22/2010   S/p removal by ENT 09/2010    MYOCARDIAL INFARCTION, HX OF 09/09/2006   Qualifier: Diagnosis of  By: JMarinda ElkMD, JSonia Side    Nephrolithiasis    hx of   NEPHROLITHIASIS 04/01/2007   Qualifier:  Diagnosis of  By: NHarlon DittyCMA (AAMA), [name]     OA (osteoarthritis)    Osteopenia 2010   T score of -1.8   Pancreatic cysts, - look benign by MR 04/25/2019   Psoriasis    PVD (peripheral vascular disease) (HCC)    ABI indicated moderated reduction arterial flow on the left.   Tricuspid regurgitation 2009   moderate to severe, EF 55%       ACTIVE MEDICATIONS   Outpatient Medications Prior to Visit  Medication Sig Dispense Refill   amLODipine (NORVASC) 10 MG tablet TAKE 1 TABLET(10 MG) BY MOUTH DAILY 90 tablet 3   Apremilast (OTEZLA) 30 MG TABS Take 1 tablet by mouth in the morning and at bedtime.     aspirin EC 81 MG tablet Take 1 tablet (81 mg total) by mouth daily. 90 tablet 3   Blood Glucose Monitoring Suppl (ACCU-CHEK GUIDE ME) w/Device KIT 1 each by Does not apply route 3 (three) times daily. 1 kit 1   Calcium Carbonate-Vitamin D (CALCIUM 600+D) 600-400 MG-UNIT tablet Take 1 tablet by mouth daily. 180 tablet 1   clopidogrel (PLAVIX) 75 MG tablet TAKE 1 TABLET(75 MG) BY MOUTH DAILY 90 tablet 3   famotidine (PEPCID) 20 MG tablet Take 1 tablet (20 mg total) by mouth 2 (two) times daily. 180 tablet 1  furosemide (LASIX) 20 MG tablet Take 1 tablet (20 mg total) by mouth daily. 90 tablet 3   glucose blood (ACCU-CHEK GUIDE) test strip Check blood sugar 3 times a day 300 each 3   insulin glargine (LANTUS SOLOSTAR) 100 UNIT/ML Solostar Pen Inject 35 Units into the skin at bedtime. 15 mL 5   Insulin Pen Needle (BD PEN NEEDLE NANO 2ND GEN) 32G X 4 MM MISC USE TO INJECT TWICE DAILY AS DIRECTED 200 each 1   Lancets (FREESTYLE) lancets USE TO CHECK BLOOD SUGAR FOUR TIMES DAILY- BEFORE MEALS AND EVERY NIGHT AT BEDTIME 100 each 5   melatonin 5 MG TABS Take 5 mg by mouth at bedtime.     olmesartan (BENICAR) 5 MG tablet Take 1 tablet (5 mg total) by mouth daily. 90 tablet 3   rosuvastatin (CRESTOR) 20 MG tablet Take 1 tablet (20 mg total) by mouth daily. 90 tablet 3   nitroGLYCERIN  (NITROSTAT) 0.3 MG SL tablet Place 1 tablet (0.3 mg total) under the tongue every 5 (five) minutes as needed for chest pain. (Patient not taking: Reported on 02/03/2020) 90 tablet 12   potassium chloride (KLOR-CON 10) 10 MEQ tablet TAKE 1 TABLET(10 MEQ) BY MOUTH ONCE DAILY 180 tablet 1   No facility-administered medications prior to visit.   Objective:   BP (!) 154/50 (BP Location: Right Arm, Patient Position: Sitting, Cuff Size: Small)   Pulse 64   Temp 98.4 F (36.9 C) (Oral)   Ht _0  (1.499 m)   Wt 114 lb 14.4 oz (52.1 kg)   SpO2 100%   BMI 23.21 kg/m  Wt Readings from Last 3 Encounters:  07/04/20 114 lb 14.4 oz (52.1 kg)  04/03/20 116 lb 4.8 oz (52.8 kg)  01/15/20 115 lb 4.8 oz (52.3 kg)   BP Readings from Last 3 Encounters:  07/04/20 (!) 154/50  04/03/20 (!) 141/60  01/15/20 (!) 153/66   Constitutional: very pleasant, thin, well-appearing woman sitting in chair, in no acute distress HENT: normocephalic atraumatic, mucous membranes moist Eyes: conjunctiva non-erythematous Neck: supple Cardiovascular: regular rate and rhythm, no m/r/g, no lower extremity edema Pulmonary/Chest: normal work of breathing on room air, lungs clear to auscultation bilaterally Abdominal: non-distended MSK: normal bulk and tone Neurological: alert & oriented x 3, normal gait Skin: warm and dry Psych: normal mood and affect  Health Maintenance:   Health Maintenance  Topic Date Due   Zoster Vaccines- Shingrix (1 of 2) Never done   COVID-19 Vaccine (3 - Moderna risk series) 05/18/2019   INFLUENZA VACCINE  08/13/2020   LIPID PANEL  09/13/2020   HEMOGLOBIN A1C  10/04/2020   FOOT EXAM  12/26/2020   OPHTHALMOLOGY EXAM  06/19/2021   TETANUS/TDAP  10/08/2021   DEXA SCAN  Completed   PNA vac Low Risk Adult  Completed   HPV VACCINES  Aged Out   Assessment & Plan:   Problem List Items Addressed This Visit       Cardiovascular and Mediastinum   Essential hypertension (Chronic)    BP  154/50->149/61 on repeat. Patient denies headaches, dizziness, lightheadedness, blurry vision. She states she only occasionally measures her BP at home and systolic BP has been in the high 130s. She has no further falls.  Last BMP 02/03/20 showed sCr 1.20, eGFR 42.  A/P: Patient's BP is slightly elevated today, above her goal BP of <140/90. Given her age and risk for falls, will not make any adjustments today. - continue amlodipine 10 mg daily and olmesartan  5 mg daily - BMP today (given her CKD stage 3b, recommend q6 month BMP to monitor renal function) --> ADDENDUM: BMP with Na 141, K 4.2, BUN/Cr 19/1.43, eGFR 37. Discussed with patient and advised her to discontinue her Lasix and potassium (currently on Lasix 20 mg daily and and potassium 10 mEq daily) given that she has no edema and most recent echo showed normal EF. On chart review, it appears she was started on the Lasix for mitral valve regurgitation. Will have her RTC in 58-monthfor repeat BMP and assessment for edema.        Relevant Medications   nitroGLYCERIN (NITROSTAT) 0.3 MG SL tablet   Other Relevant Orders   BMP8+Anion Gap (Completed)     Digestive   GERD (gastroesophageal reflux disease)    Patient reports for the last 184-monthhe has had breakthrough reflux symptoms despite taking famotidine twice daily, avoiding trigger foods, and remaining upright at least 30 minutes after meals. She denies dysphagia, odynophagia, shortness of breath, abdominal pain.  Assessment/Plan: Patient with breakthrough symptoms of GERD without red flag symptoms. Will trial short course of PPI. - start pantoprazole 20 mg daily, #42 ; patient instructed to take until symptoms no longer occur daily       Relevant Medications   pantoprazole (PROTONIX) 20 MG tablet     Endocrine   Type 2 diabetes mellitus with stage 3 chronic kidney disease, with long-term current use of insulin (HCC) - Primary (Chronic)    Hemoglobin A1c stable at 7.9% today.  Patient forgot her glucometer today. She reports her fasting blood sugars average in the 120s-130s. Denies frequent low blood sugars. Reports lowest blood sugar of 82 one morning which was asymptomatic. She reports adherence to Lantus 25 units nightly, [name] 10 mg daily.   Assessment/Plan: A1c at goal today (goal is <8% given her age and comorbidities), stable at 7.9%. Unfortunately patient did not bring her glucometer for review, however she does not report lows. Given her age, comorbidities, and risk for falls, she would be high risk if she has hypoglycemia.  - continue Lantus 35 units daily, [name] 10 mg daily, olmesartan 5 mg daily - counseled patient to continue to monitor closely for lows, we would have low threshold to decrease her Lantus dose       Relevant Medications   empagliflozin ([name]) 10 MG TABS tablet   Other Relevant Orders   POC Hbg A1C (Completed)   Glucose, capillary (Completed)     Genitourinary   CKD (chronic kidney disease), stage III (HCC) (Chronic)    Likely 2/2 to DMII and HTN. Last BMP 02/03/20 showed sCr 1.20, eGFR 42.  Plan: Patient's kidney function should be monitored at least q6 months. - repeat BMP today (given her CKD stage 3b, recommend q6 month BMP to monitor renal function) --> ADDENDUM: BMP with Na 141, K 4.2, BUN/Cr 19/1.43, eGFR 37. Discussed with patient and advised her to discontinue her Lasix and potassium (currently on Lasix 20 mg daily and and potassium 10 mEq daily) given that she has no edema and most recent echo showed normal EF. On chart review, it appears she was started on the Lasix for mitral valve regurgitation. Will have her RTC in 1-65-monthr repeat BMP and assessment for edema.          Other   S/P CABG (coronary artery bypass graft) (Chronic)   Relevant Medications   nitroGLYCERIN (NITROSTAT) 0.3 MG SL tablet   Other Visit Diagnoses  Chronic heart failure with preserved ejection fraction (HCC)  (Chronic)       Relevant Medications   nitroGLYCERIN (NITROSTAT) 0.3 MG SL tablet   potassium chloride (KLOR-CON 10) 10 MEQ tablet      Return in about 3 months (around 10/04/2020) for HTN, DM (A1c). Correction - based on lab work, have discussed with patient and advised her to return in 1 month for repeat BMP and assessment for edema.  Pt discussed with Dr. Jimmye Norman.  Alexandria Lodge, MD Internal Medicine Resident, PGY-1 [facility] Pager: [phone] 1:55 PM, 07/06/2020

## 2020-07-04 NOTE — Patient Instructions (Addendum)
[name],   Thank you for your visit to the [facility] today. It was a pleasure seeing you. Today we discussed the following:  1) Diabetes - Your hemoglobin A1c was stable and good today! - I am not making any changes to your diabetes regimen, however I want you to contact us if you having frequent low blood sugars. - Please bring your glucometer to your next visit.  2) Hypertension - Your BP was a bit above goal today. I'm not going to make any changes to your medications for now. - I am getting some blood work to evaluate your kidney function and electrolytes. I will call you with the result. - If you're able, check your BP once daily and record those values to bring to your next appointment.   HOW TO TAKE YOUR BLOOD PRESSURE: Rest 5 minutes before taking your blood pressure. Don't smoke or drink caffeinated beverages for at least 30 minutes before. Take your blood pressure before (not after) you eat. Sit comfortably with your back supported and both feet on the floor (don't cross your legs). Elevate your arm to heart level on a table or a desk. Use the proper sized cuff. It should fit smoothly and snugly around your bare upper arm. There should be enough room to slip a fingertip under the cuff. The bottom edge of the cuff should be 1 inch above the crease of the elbow. Ideally, take 3 measurements at one sitting and record the average.   3) Acid reflux - I have prescribed a short course of pantoprazole for you to take daily until your symptoms improve. After your symptoms improve, you can go back to taking your famotidine. If your symptoms do not improve with the pantoprazole, please let us know.   It has been an honor being your primary care doctor over the last year. You have been assigned a new PCP, Dr. Benancio Deeds. If you haven't already, you should be receiving a letter in the mail with this information. Please call the clinic on or  after July 6 to schedule your next follow-up appointment. We would like to see you back in 3 months.   If you have any questions or concerns, please call our clinic at [phone] between 9am-5pm. Outside of these hours, call [phone] and ask for the internal medicine resident on call. If you feel you are having a medical emergency please call 911.

## 2020-07-05 LAB — BMP8+ANION GAP
Anion Gap: 14 mmol/L (ref 10.0–18.0)
BUN/Creatinine Ratio: 13 (ref 12–28)
BUN: 19 mg/dL (ref 8–27)
CO2: 25 mmol/L (ref 20–29)
Calcium: 10 mg/dL (ref 8.7–10.3)
Chloride: 102 mmol/L (ref 96–106)
Creatinine, Ser: 1.43 mg/dL — ABNORMAL HIGH (ref 0.57–1.00)
Glucose: 155 mg/dL — ABNORMAL HIGH (ref 65–99)
Potassium: 4.2 mmol/L (ref 3.5–5.2)
Sodium: 141 mmol/L (ref 134–144)
eGFR: 37 mL/min/{1.73_m2} — ABNORMAL LOW (ref >59)

## 2020-07-06 ENCOUNTER — Encounter: Payer: Self-pay | Admitting: Student

## 2020-07-06 NOTE — Assessment & Plan Note (Signed)
Hemoglobin A1c stable at 7.9% today. Patient forgot her glucometer today. She reports her fasting blood sugars average in the 120s-130s. Denies frequent low blood sugars. Reports lowest blood sugar of 82 one morning which was asymptomatic. She reports adherence to Lantus 25 units nightly, [name] 10 mg daily.   Assessment/Plan: A1c at goal today (goal is <8% given her age and comorbidities), stable at 7.9%. Unfortunately patient did not bring her glucometer for review, however she does not report lows. Given her age, comorbidities, and risk for falls, she would be high risk if she has hypoglycemia.  - continue Lantus 35 units daily, [name] 10 mg daily, olmesartan 5 mg daily - counseled patient to continue to monitor closely for lows, we would have low threshold to decrease her Lantus dose

## 2020-07-06 NOTE — Assessment & Plan Note (Signed)
Since our last visit, patient seen by orthopedic surgeon, Dr. Onnie Graham. He discussed possible injection vs surgery, and she elected to continue with conservative management. She tells me today that she is managing her pain with Tylenol and does not think she will ever want shoulder surgery. She was advised to follow-up with Dr. Onnie Graham PRN.

## 2020-07-06 NOTE — Assessment & Plan Note (Addendum)
BP 154/50->149/61 on repeat. Patient denies headaches, dizziness, lightheadedness, blurry vision. She states she only occasionally measures her BP at home and systolic BP has been in the high 130s. She has no further falls.  Last BMP 02/03/20 showed sCr 1.20, eGFR 42.  A/P: Patient's BP is slightly elevated today, above her goal BP of <140/90. Given her age and risk for falls, will not make any adjustments today. - continue amlodipine 10 mg daily and olmesartan 5 mg daily - BMP today (given her CKD stage 3b, recommend q6 month BMP to monitor renal function) --> ADDENDUM: BMP with Na 141, K 4.2, BUN/Cr 19/1.43, eGFR 37. Discussed with patient and advised her to discontinue her Lasix and potassium (currently on Lasix 20 mg daily and and potassium 10 mEq daily) given that she has no edema and most recent echo showed normal EF. On chart review, it appears she was started on the Lasix for mitral valve regurgitation. Will have her RTC in 36-monthfor repeat BMP and assessment for edema.

## 2020-07-06 NOTE — Assessment & Plan Note (Signed)
Patient reports for the last 96-month she has had breakthrough reflux symptoms despite taking famotidine twice daily, avoiding trigger foods, and remaining upright at least 30 minutes after meals. She denies dysphagia, odynophagia, shortness of breath, abdominal pain.  Assessment/Plan: Patient with breakthrough symptoms of GERD without red flag symptoms. Will trial short course of PPI. - start pantoprazole 20 mg daily, #42 ; patient instructed to take until symptoms no longer occur daily

## 2020-07-06 NOTE — Assessment & Plan Note (Addendum)
Likely 2/2 to DMII and HTN. Last BMP 02/03/20 showed sCr 1.20, eGFR 42.  Plan: Patient's kidney function should be monitored at least q6 months. - repeat BMP today (given her CKD stage 3b, recommend q6 month BMP to monitor renal function) --> ADDENDUM: BMP with Na 141, K 4.2, BUN/Cr 19/1.43, eGFR 37. Discussed with patient and advised her to discontinue her Lasix and potassium (currently on Lasix 20 mg daily and and potassium 10 mEq daily) given that she has no edema and most recent echo showed normal EF. On chart review, it appears she was started on the Lasix for mitral valve regurgitation. Will have her RTC in 54-monthfor repeat BMP and assessment for edema.

## 2020-07-17 NOTE — Progress Notes (Signed)
Internal Medicine Clinic Attending  Case discussed with Dr. Watson  At the time of the visit.  We reviewed the resident's history and exam and pertinent patient test results.  I agree with the assessment, diagnosis, and plan of care documented in the resident's note.  

## 2020-07-18 ENCOUNTER — Other Ambulatory Visit: Payer: Self-pay

## 2020-07-18 ENCOUNTER — Ambulatory Visit (INDEPENDENT_AMBULATORY_CARE_PROVIDER_SITE_OTHER): Payer: Medicare Other | Admitting: Internal Medicine

## 2020-07-18 ENCOUNTER — Encounter: Payer: Self-pay | Admitting: Internal Medicine

## 2020-07-18 VITALS — BP 142/59 | HR 64 | Temp 98.2°F | Ht 62.0 in | Wt 118.6 lb

## 2020-07-18 DIAGNOSIS — M25512 Pain in left shoulder: Secondary | ICD-10-CM

## 2020-07-18 DIAGNOSIS — M25511 Pain in right shoulder: Secondary | ICD-10-CM | POA: Diagnosis not present

## 2020-07-18 DIAGNOSIS — E1151 Type 2 diabetes mellitus with diabetic peripheral angiopathy without gangrene: Secondary | ICD-10-CM

## 2020-07-18 DIAGNOSIS — I739 Peripheral vascular disease, unspecified: Secondary | ICD-10-CM

## 2020-07-18 DIAGNOSIS — I129 Hypertensive chronic kidney disease with stage 1 through stage 4 chronic kidney disease, or unspecified chronic kidney disease: Secondary | ICD-10-CM

## 2020-07-18 DIAGNOSIS — I10 Essential (primary) hypertension: Secondary | ICD-10-CM

## 2020-07-18 DIAGNOSIS — R3915 Urgency of urination: Secondary | ICD-10-CM | POA: Diagnosis not present

## 2020-07-18 DIAGNOSIS — N3941 Urge incontinence: Secondary | ICD-10-CM | POA: Insufficient documentation

## 2020-07-18 DIAGNOSIS — Z794 Long term (current) use of insulin: Secondary | ICD-10-CM

## 2020-07-18 DIAGNOSIS — E1122 Type 2 diabetes mellitus with diabetic chronic kidney disease: Secondary | ICD-10-CM | POA: Diagnosis not present

## 2020-07-18 DIAGNOSIS — N1831 Chronic kidney disease, stage 3a: Secondary | ICD-10-CM

## 2020-07-18 LAB — POCT URINALYSIS DIPSTICK
Bilirubin, UA: NEGATIVE
Glucose, UA: POSITIVE — AB
Ketones, UA: NEGATIVE
Leukocytes, UA: NEGATIVE
Nitrite, UA: NEGATIVE
Protein, UA: NEGATIVE
Spec Grav, UA: 1.015 (ref 1.010–1.025)
Urobilinogen, UA: 0.2 E.U./dL
pH, UA: 6.5 (ref 5.0–8.0)

## 2020-07-18 NOTE — Progress Notes (Signed)
CC: New to provider, follow up c/o urinary urgency and shoulder pain  HPI:  [name] is a 80 y.o. with a past medical history stated below who presents with complaint of urinary urgency and bilateral shoulder pain. [name] states that over the last 6 months she has experienced urinary frequency and urgency at night. She states that she usually sleeps through the night, but for the last 6 months she awakes 1 to 2 times a night to urinate. She expresses that she feels intense urgency when she awakes at night to use the bathroom and "barely makes it to the bathroom". She denies any burning or pain with urination. She denies any fever, chills or flank pain. She states that she has had a UTI in the past, "many years ago" and knows what the symptoms are and denies those symptoms. [name] also complains of bilateral shoulder pain. She is able to raise her right shoulder 90 degree and left shoulder 180 degree before pain is elicited. Heavy lifting and pulling of objects causes pain. She states the shoulder pain has been ongoing for several years and has seen an orthopedic surgeon about the shoulder pain. Surgery was recommended by the Surgeon and she has declined surgery at this time. She takes Tylenol-arthritis OTC for the pain. She has received steroid injections for both shoulders in the past with temporary relief. She declined physical therapy for shoulder pain.    Past Medical History:  Diagnosis Date   Acute colitis 10/14/2018   Bloody diarrhea 10/13/2018   CAD (coronary artery disease) 2006   s/p Quadrupal CABG 2006   CHF (congestive heart failure) ([place]) 2010   EF 30-35% from Echo 06/2008   CKD (chronic kidney disease) stage 4, GFR 15-29 ml/min (HCC)    fluctuating between stage 3 and 4 depending on GFR   CKD (chronic kidney disease), stage III ([place]) 09/27/2013   De Quervain's tenosynovitis, left 11/13/2015   Diverticulosis    DIVERTICULOSIS OF COLON 04/01/2007    Qualifier: Diagnosis of  By: Harlon Ditty CMA (AAMA), [name]     DM (diabetes mellitus) ([place])    insulin dependent   Dyslipidemia 10/15/2005   Qualifier: Diagnosis of  By: Derrel Nip MD, Helene Kelp     History of colonic polyps    HLD (hyperlipidemia)    HTN (hypertension)    Lumbar spinal stenosis    MI (myocardial infarction) ([place]) 2003   . [place] [facility]   Mouth ulcer adjacent to jaw bone 08/22/2010   S/p removal by ENT 09/2010    MYOCARDIAL INFARCTION, HX OF 09/09/2006   Qualifier: Diagnosis of  By: Marinda Elk MD, Sonia Side     Nephrolithiasis    hx of   NEPHROLITHIASIS 04/01/2007   Qualifier: Diagnosis of  By: Harlon Ditty CMA (AAMA), [name]     OA (osteoarthritis)    Osteopenia 2010   T score of -1.8   Pancreatic cysts, - look benign by MR 04/25/2019   Psoriasis    PVD (peripheral vascular disease) (HCC)    ABI indicated moderated reduction arterial flow on the left.   Tricuspid regurgitation 2009   moderate to severe, EF 55%   Review of Systems:  Review of Systems  Constitutional:  Negative for chills and fever.  Eyes:  Negative for blurred vision and double vision.  Respiratory:  Negative for cough.   Cardiovascular:  Positive for claudication. Negative for chest pain.  Gastrointestinal:  Negative for heartburn.  Genitourinary:  Positive for frequency and urgency. Negative for  flank pain.  Musculoskeletal:  Negative for joint pain (bilateral shoulder pain).  Neurological:  Positive for tingling (lower leg and feet bilaterally). Negative for dizziness and headaches.  Psychiatric/Behavioral: Negative.       Vitals:   07/18/20 1002 07/18/20 1005  BP: (!) 149/53 (!) 142/59  Pulse: 65 64  Temp: 98.2 F (36.8 C)   TempSrc: Oral   SpO2: 97%   Weight: 118 lb 9.6 oz (53.8 kg)   Height: 5\' 2"  (1.575 m)      Physical Exam: Physical Exam Constitutional:      Appearance: Normal appearance. She is normal weight.  HENT:     Head: Normocephalic and atraumatic.  Eyes:     General: Lids are  normal.  Cardiovascular:     Rate and Rhythm: Normal rate and regular rhythm.     Pulses: Normal pulses.     Heart sounds: No murmur heard.   No friction rub. No gallop.  Pulmonary:     Effort: Pulmonary effort is normal.     Breath sounds: Normal breath sounds. No wheezing, rhonchi or rales.  Abdominal:     General: Bowel sounds are normal.  Skin:    General: Skin is warm and dry.  Neurological:     Mental Status: She is alert.       Assessment & Plan:   See Encounters Tab for problem based charting.  Patient seen with Dr. Heber Sheffield

## 2020-07-18 NOTE — Assessment & Plan Note (Addendum)
Last HA1C- 7.9% checked 07/04/20. Patient is at goal (<8%).  [name] complained of urinary urgency and frequency for the past 6 months. She was started on [name] in February 2022. A likely side effect of medication, UTI. A urine sample was collected at today's visit 07/18/20. Will call patient with results. Patient was advised to stop taking [name] for 2 weeks to evaluate if urinary symptoms persist.  Results of urine sample- negative for leukocytes esterase and nitrates and positive for glucose

## 2020-07-18 NOTE — Assessment & Plan Note (Signed)
[name] BP today 07/18/20 - 142/59. BP is well controlled on current medication Amlodipine 10mg  and olmesartan 5mg . Continue as prescribed.

## 2020-07-18 NOTE — Assessment & Plan Note (Signed)
Patient complained of urinary frequency and a urine sample was collected in office 07/18/20. Results were negative for leukocyte esterase and nitrates.

## 2020-07-18 NOTE — Assessment & Plan Note (Signed)
[name] complained of intermittent leg pain which is relieved by Tylenol and walking around. Patient states this issue waxes and wane. She states that Tylenol is fine for her symptom relief.

## 2020-07-18 NOTE — Patient Instructions (Addendum)
I'll give you a call about your urine results  2. Stop [name] for about 2 weeks and assess if you are still urinating frequently.

## 2020-07-19 LAB — URINALYSIS, ROUTINE W REFLEX MICROSCOPIC
Bilirubin, UA: NEGATIVE
Ketones, UA: NEGATIVE
Leukocytes,UA: NEGATIVE
Nitrite, UA: NEGATIVE
RBC, UA: NEGATIVE
Specific Gravity, UA: 1.022 (ref 1.005–1.030)
Urobilinogen, Ur: 0.2 mg/dL (ref 0.2–1.0)
pH, UA: 6.5 (ref 5.0–7.5)

## 2020-07-20 NOTE — Progress Notes (Signed)
Internal Medicine Clinic Attending  I saw and evaluated the patient.  I personally confirmed the key portions of the history and exam documented by Dr. Jeanice Lim   and I reviewed pertinent patient test results.  The assessment, diagnosis, and plan were formulated together and I agree with the documentation in the resident's note. Patient has complaints of nocturia started 5-6 months ago. No Dysuria.  On review of chart appears in feburary she was changed from [place] to [place] which I suspect is the cause.  We obtained U/A to r/o UTI and only shows expected SGLT2i induced glucosuia.  Advised patient to try 2 week trial off Jaridance to see if symptoms improve, if so will have to decide if side effect is worth benefit of jaridance versus change.

## 2020-07-27 ENCOUNTER — Telehealth: Payer: Self-pay

## 2020-07-27 NOTE — Telephone Encounter (Signed)
Received TC from patient who states she was recently on [place]" [place] the [place].  One of the ladies on the trip tested Covid positive 2 days ago.  Pt states she called urgent care regarding getting tested and was told the testing appt would be anywhere from $40-$100.  Patient is asymptomatic, other than slight fatigue which she contributes to the trip.  RN informed patient it is too early to test and if she does she may get a false negative result.  She was instructed to try and isolate as much as she can until Wednesday and wear a mask when around anyone.  She was told if she develops any symptoms such as coughing, fever, runny nose, muscle aches, increased fatigue to call us back.  She was instructed if she develops and SOB, chest pain, or pain in her legs to present to urgent care or ED and she verbalized understanding. Will forward to PCP. SChaplin, RN,BSN

## 2020-08-17 ENCOUNTER — Other Ambulatory Visit: Payer: Self-pay | Admitting: *Deleted

## 2020-08-17 DIAGNOSIS — K219 Gastro-esophageal reflux disease without esophagitis: Secondary | ICD-10-CM

## 2020-08-22 MED ORDER — PANTOPRAZOLE SODIUM 20 MG PO TBEC: 20.0000 mg | DELAYED_RELEASE_TABLET | Freq: Every day | ORAL | 1 refills | Status: DC

## 2020-08-22 NOTE — Telephone Encounter (Signed)
pantoprazole (PROTONIX) 20 MG tablet (Expired), refill request @ [id number] - [place], [place].  Per patient the pharmacy is waiting to hear back from the clinic. Requesting the Rx for 90 days supply. Pt would like a call back.

## 2020-11-01 ENCOUNTER — Encounter: Payer: Medicare Other | Admitting: Internal Medicine

## 2020-12-10 ENCOUNTER — Encounter: Payer: Self-pay | Admitting: *Deleted

## 2020-12-12 ENCOUNTER — Other Ambulatory Visit: Payer: Self-pay | Admitting: Student

## 2020-12-12 ENCOUNTER — Other Ambulatory Visit: Payer: Self-pay | Admitting: Internal Medicine

## 2020-12-12 DIAGNOSIS — N1831 Chronic kidney disease, stage 3a: Secondary | ICD-10-CM

## 2020-12-12 DIAGNOSIS — Z794 Long term (current) use of insulin: Secondary | ICD-10-CM

## 2020-12-12 MED ORDER — BD PEN NEEDLE NANO 2ND GEN 32G X 4 MM MISC: 1 refills | Status: DC

## 2020-12-12 NOTE — Telephone Encounter (Signed)
Next appt scheduled 01/18/21 with PCP.

## 2021-01-18 ENCOUNTER — Ambulatory Visit (INDEPENDENT_AMBULATORY_CARE_PROVIDER_SITE_OTHER): Payer: No Typology Code available for payment source | Admitting: Internal Medicine

## 2021-01-18 VITALS — BP 158/62 | HR 64 | Temp 98.1°F | Ht 62.0 in | Wt 116.6 lb

## 2021-01-18 DIAGNOSIS — Z794 Long term (current) use of insulin: Secondary | ICD-10-CM

## 2021-01-18 DIAGNOSIS — I129 Hypertensive chronic kidney disease with stage 1 through stage 4 chronic kidney disease, or unspecified chronic kidney disease: Secondary | ICD-10-CM | POA: Diagnosis not present

## 2021-01-18 DIAGNOSIS — Z23 Encounter for immunization: Secondary | ICD-10-CM | POA: Diagnosis not present

## 2021-01-18 DIAGNOSIS — M8580 Other specified disorders of bone density and structure, unspecified site: Secondary | ICD-10-CM

## 2021-01-18 DIAGNOSIS — E1151 Type 2 diabetes mellitus with diabetic peripheral angiopathy without gangrene: Secondary | ICD-10-CM

## 2021-01-18 DIAGNOSIS — E785 Hyperlipidemia, unspecified: Secondary | ICD-10-CM

## 2021-01-18 DIAGNOSIS — N1831 Chronic kidney disease, stage 3a: Secondary | ICD-10-CM | POA: Diagnosis not present

## 2021-01-18 DIAGNOSIS — K219 Gastro-esophageal reflux disease without esophagitis: Secondary | ICD-10-CM | POA: Diagnosis not present

## 2021-01-18 DIAGNOSIS — Z951 Presence of aortocoronary bypass graft: Secondary | ICD-10-CM

## 2021-01-18 DIAGNOSIS — I739 Peripheral vascular disease, unspecified: Secondary | ICD-10-CM

## 2021-01-18 DIAGNOSIS — I10 Essential (primary) hypertension: Secondary | ICD-10-CM

## 2021-01-18 DIAGNOSIS — E1122 Type 2 diabetes mellitus with diabetic chronic kidney disease: Secondary | ICD-10-CM | POA: Diagnosis not present

## 2021-01-18 LAB — GLUCOSE, CAPILLARY: Glucose-Capillary: 151 mg/dL — ABNORMAL HIGH (ref 70–99)

## 2021-01-18 LAB — POCT GLYCOSYLATED HEMOGLOBIN (HGB A1C): Hemoglobin A1C: 8 % — AB (ref 4.0–5.6)

## 2021-01-18 MED ORDER — AMLODIPINE BESYLATE 10 MG PO TABS: ORAL_TABLET | ORAL | 3 refills | Status: DC

## 2021-01-18 MED ORDER — OLMESARTAN MEDOXOMIL 5 MG PO TABS: 10.0000 mg | ORAL_TABLET | Freq: Every day | ORAL | 3 refills | Status: DC

## 2021-01-18 MED ORDER — PANTOPRAZOLE SODIUM 20 MG PO TBEC: 20.0000 mg | DELAYED_RELEASE_TABLET | Freq: Every day | ORAL | 3 refills | Status: DC

## 2021-01-18 MED ORDER — ROSUVASTATIN CALCIUM 20 MG PO TABS: 20.0000 mg | ORAL_TABLET | Freq: Every day | ORAL | 3 refills | Status: DC

## 2021-01-18 MED ORDER — CLOPIDOGREL BISULFATE 75 MG PO TABS: ORAL_TABLET | ORAL | 3 refills | Status: DC

## 2021-01-18 NOTE — Assessment & Plan Note (Addendum)
A1C 8 today.  Urine symptoms resolved but she doesn't recall how that related to [name].  She will start back again and monitor symptoms.

## 2021-01-18 NOTE — Assessment & Plan Note (Signed)
No symptoms on treatment, takes daily.

## 2021-01-18 NOTE — Assessment & Plan Note (Signed)
Feet at night feel like the bottom of the foot is drawing up; has been taking magnesium on someones recommedation.  Rubs Biofreeze which ehlps much.  CAlf pain when stretching.  No skin changes.

## 2021-01-18 NOTE — Progress Notes (Signed)
[name] is here to meet new PCP (me).  We spent most of our short visit getting acquainted, reconciling medications and reviewing refill needs, and reviewing her chronic conditions.  Doesn't check bp at home    A1c 8, stable, am cbgs are in 100s, no symptomatic or known lows in last few years.  Was convinced by an insurance salesman to change insurances at first of year from [facility] to [facility].  She has since discovered that coverage is more limited and plans to go back to [facility].  Patient Active Problem List   Diagnosis Date Noted   Urgency of urination 07/18/2020   Healthcare maintenance 12/27/2019   Chronic insomnia 05/18/2019   Pancreatic cysts, - look benign by MR 04/25/2019   Weight loss, non-intentional 03/17/2019   Need for immunization against influenza 11/18/2018   Hyperlipidemia 08/05/2018   Fall 08/04/2018   GERD (gastroesophageal reflux disease) 01/17/2015   CKD (chronic kidney disease), stage III ([place]) 09/27/2013   Localized, secondary osteoarthritis of the shoulder region 04/19/2013   Colon cancer screening 12/29/2012   Mitral valve regurgitation 04/22/2010   Essential hypertension 09/09/2006   SYMPTOM, COUGH 09/09/2006   Osteoarthritis of back 12/23/2005   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/15/2005   Peripheral vascular disease ([place]) 10/15/2005   Psoriasis 10/15/2005   Osteopenia 10/15/2005   Type 2 diabetes mellitus with stage 3 chronic kidney disease, with long-term current use of insulin ([place]) 01/14/2004    Current Outpatient Medications:    Apremilast (OTEZLA) 30 MG TABS, Take 1 tablet by mouth in the morning and at bedtime., Disp: , Rfl:    aspirin EC 81 MG tablet, Take 1 tablet (81 mg total) by mouth daily., Disp: 90 tablet, Rfl: 3   Calcium Carbonate-Vitamin D (CALCIUM 600+D) 600-400 MG-UNIT tablet, Take 1 tablet by mouth daily., Disp: 180 tablet, Rfl: 1   LANTUS SOLOSTAR 100 UNIT/ML Solostar Pen, ADMINISTER 35 UNITS UNDER THE SKIN AT BEDTIME,  Disp: 15 mL, Rfl: 5   melatonin 5 MG TABS, Take 5 mg by mouth at bedtime., Disp: , Rfl:    amLODipine (NORVASC) 10 MG tablet, TAKE 1 TABLET(10 MG) BY MOUTH DAILY, Disp: 90 tablet, Rfl: 3   Blood Glucose Monitoring Suppl (ACCU-CHEK GUIDE ME) w/Device KIT, 1 each by Does not apply route 3 (three) times daily., Disp: 1 kit, Rfl: 1   clopidogrel (PLAVIX) 75 MG tablet, TAKE 1 TABLET(75 MG) BY MOUTH DAILY, Disp: 90 tablet, Rfl: 3   glucose blood (ACCU-CHEK GUIDE) test strip, Check blood sugar 3 times a day, Disp: 300 each, Rfl: 3   Insulin Pen Needle (BD PEN NEEDLE NANO 2ND GEN) 32G X 4 MM MISC, USE TO INJECT TWICE DAILY AS DIRECTED, Disp: 200 each, Rfl: 1   Lancets (FREESTYLE) lancets, USE TO CHECK BLOOD SUGAR FOUR TIMES DAILY- BEFORE MEALS AND EVERY NIGHT AT BEDTIME, Disp: 100 each, Rfl: 5   nitroGLYCERIN (NITROSTAT) 0.3 MG SL tablet, Place 1 tablet (0.3 mg total) under the tongue every 5 (five) minutes as needed for chest pain., Disp: 90 tablet, Rfl: 12   olmesartan (BENICAR) 5 MG tablet, Take 2 tablets (10 mg total) by mouth daily., Disp: 90 tablet, Rfl: 3   pantoprazole (PROTONIX) 20 MG tablet, Take 1 tablet (20 mg total) by mouth daily., Disp: 90 tablet, Rfl: 3   rosuvastatin (CRESTOR) 20 MG tablet, Take 1 tablet (20 mg total) by mouth daily., Disp: 90 tablet, Rfl: 3  BP (!) 158/62 (BP Location: Right Arm, Cuff  Size: Small)    Pulse 64    Temp 98.1 F (36.7 C) (Oral)    Ht 5' 2" (1.575 m)    Wt 116 lb 9.6 oz (52.9 kg)    SpO2 100%    BMI 21.33 kg/m    Assessment and plan (see also problem based documentation):  Flu shot today. Little time for much today, reschedule f/u  No change in dm meds except restart empagliflozin (it had been stopped with possible association with urinary symptoms but this ended up not being the cause).  Increase dose of olmesartan from 5 mg to 10 mg daily

## 2021-01-18 NOTE — Progress Notes (Signed)
80

## 2021-01-18 NOTE — Patient Instructions (Addendum)
[name],  I enjoyed meeting you today, if only for a brief time!  We spent today reviewing your medicines and will be updating some lab tests (bloodwork)  to check your cholesterol and kidney function.  I look forward to getting together rather soon so that we can continue our conversation in more detail.  Blood pressure is creeping up higher than it should be, so please begin taking 2 of your olmesartan tabs (2 of the 5 mg) each day.  Please also begin taking your [name] for diabetes.  I'll call you with your blood test results next week.  Take care and stay well!  Dr. Jimmye Norman

## 2021-01-19 LAB — BMP8+ANION GAP
Anion Gap: 16 mmol/L (ref 10.0–18.0)
BUN/Creatinine Ratio: 13 (ref 12–28)
BUN: 13 mg/dL (ref 8–27)
CO2: 25 mmol/L (ref 20–29)
Calcium: 9.8 mg/dL (ref 8.7–10.3)
Chloride: 99 mmol/L (ref 96–106)
Creatinine, Ser: 1.04 mg/dL — ABNORMAL HIGH (ref 0.57–1.00)
Glucose: 140 mg/dL — ABNORMAL HIGH (ref 70–99)
Potassium: 4 mmol/L (ref 3.5–5.2)
Sodium: 140 mmol/L (ref 134–144)
eGFR: 54 mL/min/{1.73_m2} — ABNORMAL LOW (ref >59)

## 2021-01-19 LAB — LIPID PANEL
Chol/HDL Ratio: 3.1 ratio (ref 0.0–4.4)
Cholesterol, Total: 190 mg/dL (ref 100–199)
HDL: 62 mg/dL (ref >39)
LDL Chol Calc (NIH): 106 mg/dL — ABNORMAL HIGH (ref 0–99)
Triglycerides: 127 mg/dL (ref 0–149)
VLDL Cholesterol Cal: 22 mg/dL (ref 5–40)

## 2021-01-19 LAB — VITAMIN D 25 HYDROXY (VIT D DEFICIENCY, FRACTURES): Vit D, 25-Hydroxy: 34 ng/mL (ref 30.0–100.0)

## 2021-01-30 MED ORDER — EMPAGLIFLOZIN 10 MG PO TABS: 10.0000 mg | ORAL_TABLET | Freq: Every day | ORAL | 1 refills | Status: DC

## 2021-01-30 NOTE — Assessment & Plan Note (Signed)
Uncontrolled at 158/62; increase olmesartan from 5 to 10 mg daily, continue amlodipine 10 mg daily. Does not check BP at home.

## 2021-01-31 ENCOUNTER — Encounter: Payer: Medicare Other | Admitting: Internal Medicine

## 2021-02-08 ENCOUNTER — Other Ambulatory Visit: Payer: Self-pay | Admitting: Internal Medicine

## 2021-02-08 DIAGNOSIS — N1831 Chronic kidney disease, stage 3a: Secondary | ICD-10-CM

## 2021-02-08 DIAGNOSIS — Z794 Long term (current) use of insulin: Secondary | ICD-10-CM

## 2021-02-08 MED ORDER — LANTUS SOLOSTAR 100 UNIT/ML ~~LOC~~ SOPN: PEN_INJECTOR | SUBCUTANEOUS | 5 refills | Status: DC

## 2021-03-25 ENCOUNTER — Other Ambulatory Visit: Payer: Self-pay

## 2021-03-25 ENCOUNTER — Ambulatory Visit (INDEPENDENT_AMBULATORY_CARE_PROVIDER_SITE_OTHER): Payer: No Typology Code available for payment source | Admitting: Student

## 2021-03-25 ENCOUNTER — Encounter: Payer: Self-pay | Admitting: Student

## 2021-03-25 DIAGNOSIS — R197 Diarrhea, unspecified: Secondary | ICD-10-CM | POA: Diagnosis not present

## 2021-03-25 DIAGNOSIS — I10 Essential (primary) hypertension: Secondary | ICD-10-CM

## 2021-03-25 NOTE — Patient Instructions (Signed)
Thank you, [name] for allowing [place] to provide your care today. Today we discussed . ? ?Diarrhea ?I am so sorry that you are having frequent episodes of diarrhea! I am, however, glad that it is getting better. I expect this to continue to improve. If the diarrhea worsens over the next few days or you have any episodes of bloody diarrhae, fevers, chills, worsening abdominal pain, please call our clinic.   ? ?For your trip on Friday, if you are still having episodes of diarrhea, you can purchase over the counter imodium and take on for the car trip to get you to [place]. ? ?Lets schedule a telephone visit for next Monday to make sure you are doing better! ?  ?Referrals ordered today:  ? ?Referral Orders  ?No referral(s) requested today  ? ? ?Follow up:  1 week telephone appointment   ? ?Should you have any questions or concerns please call the internal medicine clinic at [phone].   ? ?[name], D.O. ?[place] ?  ?

## 2021-03-25 NOTE — Assessment & Plan Note (Addendum)
Assessment: ?Patient with 4-5 episodes of non-bloody mucousy diarrhea that began 4 days ago. The evening before she ate raw spinach that was donated to her living facility. She cooked it down that evening and felt fine until the next morning. No one else at the spinach. She diarrhea was green in color and has now become more yellow. She denies dark or bright red blood. She denies abdominal pain, but does have some rectal pain. She had a similar episodes 5 years ago that self resolved. On chart review it appears she was sent to the ED for these episodes of diarrhea.  ? ?She denies fever, chills, recent illness, changes in medications, recent travel. She is tolerating liquids and diet without difficulty. Vital signs are stable, no tachycardia and not on av nodal blocking agents. On exam she has moist mucous membranes and trace lower extremity edema. She does have mild diffuse abdominal tenderness with deep tenderness. She does have decreased skin turgor. Suspect viral/bacterial etiology. Without her having severe symptoms such as extreme abdominal pain, fever, blood/pus I the diarrhea, over 6 stools per day and not lasting over 7 days will treat conservatively. It is a good sign that it is improving since 4 days ago. She is traveling to [facility] this upcoming Friday, talked with her if still having some diarrhea can take imodium before the car ride. If no ? ?She was given strict return precautions if any worsening or new symptoms to call. We will schedule a telehealth appointment early next week to follow up. If no improvement can consider stool studies.  ? ?Plan: ?-conservative treatment, encouraged increase PO intake of fluids ?-telehealth appointment next week ?-instructed to call clinic if symptoms worsening or new symptoms arise.  ?

## 2021-03-25 NOTE — Progress Notes (Signed)
? ?CC: Diarrhea ? ?HPI: ? ?[name] is a 81 y.o. female living with a history stated below and presents today for diarrhea. Please see problem based assessment and plan for additional details. ? ?Past Medical History:  ?Diagnosis Date  ? Acute colitis 10/14/2018  ? Bloody diarrhea 10/13/2018  ? CAD (coronary artery disease) 2006  ? s/p Quadrupal CABG 2006  ? CHF (congestive heart failure) ([place]) 2010  ? EF 30-35% from Echo 06/2008  ? CKD (chronic kidney disease) stage 4, GFR 15-29 ml/min (HCC)   ? fluctuating between stage 3 and 4 depending on GFR  ? CKD (chronic kidney disease), stage III ([place]) 09/27/2013  ? De Quervain's tenosynovitis, left 11/13/2015  ? Diverticulosis   ? DIVERTICULOSIS OF COLON 04/01/2007  ? Qualifier: Diagnosis of  By: Nelson-Smith CMA (AAMA), [name]    ? DM (diabetes mellitus) ([place])   ? insulin dependent  ? Dyslipidemia 10/15/2005  ? Qualifier: Diagnosis of  By: Derrel Nip MD, Helene Kelp    ? History of colonic polyps   ? HLD (hyperlipidemia)   ? HTN (hypertension)   ? Lumbar spinal stenosis   ? MI (myocardial infarction) ([place]) 2003  ? Marland Kitchen [place]  ? Mouth ulcer adjacent to jaw bone 08/22/2010  ? S/p removal by ENT 09/2010   ? MYOCARDIAL INFARCTION, HX OF 09/09/2006  ? Qualifier: Diagnosis of  By: Marinda Elk MD, Sonia Side    ? Nephrolithiasis   ? hx of  ? NEPHROLITHIASIS 04/01/2007  ? Qualifier: Diagnosis of  By: Nelson-Smith CMA (AAMA), [name]    ? OA (osteoarthritis)   ? Osteopenia 2010  ? T score of -1.8  ? Pancreatic cysts, - look benign by MR 04/25/2019  ? Psoriasis   ? PVD (peripheral vascular disease) ([place])   ? ABI indicated moderated reduction arterial flow on the left.  ? Tricuspid regurgitation 2009  ? moderate to severe, EF 55%  ? ? ?Current Outpatient Medications on File Prior to Visit  ?Medication Sig Dispense Refill  ? amLODipine (NORVASC) 10 MG tablet TAKE 1 TABLET(10 MG) BY MOUTH DAILY 90 tablet 3  ? Apremilast (OTEZLA) 30 MG TABS Take 1 tablet by mouth in the morning and at bedtime.     ? aspirin EC 81 MG tablet Take 1 tablet (81 mg total) by mouth daily. 90 tablet 3  ? Blood Glucose Monitoring Suppl (ACCU-CHEK GUIDE ME) w/Device KIT 1 each by Does not apply route 3 (three) times daily. 1 kit 1  ? Calcium Carbonate-Vitamin D (CALCIUM 600+D) 600-400 MG-UNIT tablet Take 1 tablet by mouth daily. 180 tablet 1  ? clopidogrel (PLAVIX) 75 MG tablet TAKE 1 TABLET(75 MG) BY MOUTH DAILY 90 tablet 3  ? empagliflozin ([name]) 10 MG TABS tablet Take 1 tablet (10 mg total) by mouth daily before breakfast. 90 tablet 1  ? glucose blood (ACCU-CHEK GUIDE) test strip Check blood sugar 3 times a day 300 each 3  ? insulin glargine (LANTUS SOLOSTAR) 100 UNIT/ML Solostar Pen ADMINISTER 35 UNITS UNDER THE SKIN AT BEDTIME 30 mL 5  ? Insulin Pen Needle (BD PEN NEEDLE NANO 2ND GEN) 32G X 4 MM MISC USE TO INJECT TWICE DAILY AS DIRECTED 200 each 1  ? Lancets (FREESTYLE) lancets USE TO CHECK BLOOD SUGAR FOUR TIMES DAILY- BEFORE MEALS AND EVERY NIGHT AT BEDTIME 100 each 5  ? melatonin 5 MG TABS Take 5 mg by mouth at bedtime.    ? nitroGLYCERIN (NITROSTAT) 0.3 MG SL tablet Place 1 tablet (0.3 mg total)  under the tongue every 5 (five) minutes as needed for chest pain. 90 tablet 12  ? olmesartan (BENICAR) 5 MG tablet Take 2 tablets (10 mg total) by mouth daily. 90 tablet 3  ? pantoprazole (PROTONIX) 20 MG tablet Take 1 tablet (20 mg total) by mouth daily. 90 tablet 3  ? rosuvastatin (CRESTOR) 20 MG tablet Take 1 tablet (20 mg total) by mouth daily. 90 tablet 3  ? [DISCONTINUED] Calcium Carb-Cholecalciferol (CALCIUM 600 + D PO) Take 1 tablet by mouth 2 (two) times daily.    ? ?No current facility-administered medications on file prior to visit.  ? ? ?Family History  ?Problem Relation Age of Onset  ? Cirrhosis Brother   ? Heart disease Brother   ? Heart attack Brother   ? Hypertension Mother   ? Hyperlipidemia Mother   ? Hyperlipidemia Son   ? Hypertension Son   ? Breast cancer Sister   ? Breast cancer Daughter   ? ? ?Social  History  ? ?Socioeconomic History  ? Marital status: Widowed  ?  Spouse name: Not on file  ? Number of children: Not on file  ? Years of education: Not on file  ? Highest education level: Not on file  ?Occupational History  ? Not on file  ?Tobacco Use  ? Smoking status: Never  ? Smokeless tobacco: Never  ?Vaping Use  ? Vaping Use: Never used  ?Substance and Sexual Activity  ? Alcohol use: No  ?  Alcohol/week: 0.0 standard drinks  ? Drug use: No  ? Sexual activity: Not on file  ?Other Topics Concern  ? Not on file  ?Social History Narrative  ? Retired Event organiser  ? Widow/Widower  ? lives in [place]  ? 4 kids (2 sons, 2 daughters) one son died many years ago in car accident.  other kids are all in [place].  ? Has friend that helps take care of her  ? ?Social Determinants of Health  ? ?Financial Resource Strain: Not on file  ?Food Insecurity: Not on file  ?Transportation Needs: Not on file  ?Physical Activity: Not on file  ?Stress: Not on file  ?Social Connections: Not on file  ?Intimate Partner Violence: Not on file  ? ?Review of Systems: ?ROS negative except for what is noted on the assessment and plan. ? ?Vitals:  ? 03/25/21 1316  ?BP: (!) 146/69  ?Pulse: 69  ?Temp: 98.1 ?F (36.7 ?C)  ?TempSrc: Oral  ?SpO2: 98%  ?Weight: 113 lb 12.8 oz (51.6 kg)  ?Height: 5' (1.524 m)  ? ? ?Physical Exam: ?Constitutional: well appearing, no acute distress ?HENT: normocephalic atraumatic, mucous membranes moist ?Eyes: conjunctiva non-erythematous ?Neck: supple ?Cardiovascular: regular rate and rhythm, no m/r/g ?Pulmonary/Chest: normal work of breathing on room air, lungs clear to auscultation bilaterally ?Abdominal: soft, non-distended, hyperactive bowel sounds. Diffusely tender to deep palpation. ?MSK: normal bulk and tone ?Neurological: alert & oriented x 3 ?Skin: warm and dry. Mild decreased skin turgor.  ?Psych: normal mood and thought process ? ?Assessment & Plan:  ? ?Acute diarrhea ?Assessment: ?Patient  with 4-5 episodes of non-bloody mucousy diarrhea that began 4 days ago. The evening before she ate raw spinach that was donated to her living facility. She cooked it down that evening and felt fine until the next morning. No one else at the spinach. She diarrhea was green in color and has now become more yellow. She denies dark or bright red blood. She denies abdominal pain, but does have some rectal  pain. She had a similar episodes 5 years ago that self resolved. On chart review it appears she was sent to the ED for these episodes of diarrhea.  ? ?She denies fever, chills, recent illness, changes in medications, recent travel. She is tolerating liquids and diet without difficulty. Vital signs are stable, no tachycardia and not on av nodal blocking agents. On exam she has moist mucous membranes and trace lower extremity edema. She does have mild diffuse abdominal tenderness with deep tenderness. She does have decreased skin turgor. Suspect viral/bacterial etiology. Without her having severe symptoms such as extreme abdominal pain, fever, blood/pus I the diarrhea, over 6 stools per day and not lasting over 7 days will treat conservatively. It is a good sign that it is improving since 4 days ago. She is traveling to [facility] this upcoming Friday, talked with her if still having some diarrhea can take imodium before the car ride.  ? ?She was given strict return precautions if any worsening or new symptoms to call. We will schedule a telehealth appointment early next week to follow up.  ? ?Plan: ?-conservative treatment, encouraged increase PO intake of fluids ?-telehealth appointment next week ?-instructed to call clinic if symptoms worsening or new symptoms arise.  ? ?Essential hypertension ?BP of 146/69. Current regimen of olmesartan 10 mg daily and amlodipine 10 mg daily. She has follow up with PCP next month. Will discuss checking blood pressures at home during follow up next week ? ?Patient discussed with Dr.  Cain Sieve ? ?[name], D.O. ?[facility], PGY-2 ?Pager: [phone], Phone: [phone] ?Date 03/25/2021 Time 2:04 PM  ?

## 2021-03-25 NOTE — Assessment & Plan Note (Signed)
BP of 146/69. Current regimen of olmesartan 10 mg daily and amlodipine 10 mg daily. She has follow up with PCP next month. Will discuss checking blood pressures at home during follow up next week ?

## 2021-03-26 NOTE — Progress Notes (Signed)
Internal Medicine Clinic Attending  Case discussed with Dr. Katsadouros  At the time of the visit.  We reviewed the resident's history and exam and pertinent patient test results.  I agree with the assessment, diagnosis, and plan of care documented in the resident's note.  

## 2021-04-01 ENCOUNTER — Ambulatory Visit (INDEPENDENT_AMBULATORY_CARE_PROVIDER_SITE_OTHER): Payer: No Typology Code available for payment source | Admitting: Student

## 2021-04-01 DIAGNOSIS — R197 Diarrhea, unspecified: Secondary | ICD-10-CM | POA: Diagnosis not present

## 2021-04-01 NOTE — Assessment & Plan Note (Signed)
Assessment: ?Follow up appointment today after patient presented with acute non-bloody, mucousy diarrhea last week. She notes resolution of her diarrhea. She did take over the counter anti-diarrheals for 1-2 days, but has not taken any the past few days. She has no other acute concerns or complaints.  ? ?Plan: ?-continue to monitor for recurrence.  ?

## 2021-04-01 NOTE — Progress Notes (Signed)
? ?  CC: follow up acute diarrhea ? ?This is a telephone encounter between [name] and [name] on 04/01/2021 for follow up for her diarrheal illness. The visit was conducted with the patient located at home and [name] at [facility]. The patient's identity was confirmed using their DOB and current address. The patient has consented to being evaluated through a telephone encounter and understands the associated risks (an examination cannot be done and the patient may need to come in for an appointment) / benefits (allows the patient to remain at home, decreasing exposure to coronavirus). I personally spent 5 minutes on medical discussion.  ? ?HPI: ? ?[name] is a 81 y.o. with PMH as below.  ? ?Please see A&P for assessment of the patient's acute and chronic medical conditions.  ? ?Past Medical History:  ?Diagnosis Date  ? Acute colitis 10/14/2018  ? Bloody diarrhea 10/13/2018  ? CAD (coronary artery disease) 2006  ? s/p Quadrupal CABG 2006  ? CHF (congestive heart failure) ([place]) 2010  ? EF 30-35% from Echo 06/2008  ? CKD (chronic kidney disease) stage 4, GFR 15-29 ml/min (HCC)   ? fluctuating between stage 3 and 4 depending on GFR  ? CKD (chronic kidney disease), stage III ([place]) 09/27/2013  ? De Quervain's tenosynovitis, left 11/13/2015  ? Diverticulosis   ? DIVERTICULOSIS OF COLON 04/01/2007  ? Qualifier: Diagnosis of  By: Nelson-Smith CMA (AAMA), [name]    ? DM (diabetes mellitus) ([place])   ? insulin dependent  ? Dyslipidemia 10/15/2005  ? Qualifier: Diagnosis of  By: Derrel Nip MD, Helene Kelp    ? History of colonic polyps   ? HLD (hyperlipidemia)   ? HTN (hypertension)   ? Lumbar spinal stenosis   ? MI (myocardial infarction) ([place]) 2003  ? Marland [address]  ? Mouth ulcer adjacent to jaw bone 08/22/2010  ? S/p removal by ENT 09/2010   ? MYOCARDIAL INFARCTION, HX OF 09/09/2006  ? Qualifier: Diagnosis of  By: Marinda Elk MD, Sonia Side    ? Nephrolithiasis   ? hx of  ? NEPHROLITHIASIS 04/01/2007  ?  Qualifier: Diagnosis of  By: Nelson-Smith CMA (AAMA), [name]    ? OA (osteoarthritis)   ? Osteopenia 2010  ? T score of -1.8  ? Pancreatic cysts, - look benign by MR 04/25/2019  ? Psoriasis   ? PVD (peripheral vascular disease) ([place])   ? ABI indicated moderated reduction arterial flow on the left.  ? Tricuspid regurgitation 2009  ? moderate to severe, EF 55%  ? ?Review of Systems:   ?Denies diarrhea ? ?Assessment & Plan:  ? ?Acute diarrhea ?Assessment: ?Follow up appointment today after patient presented with acute non-bloody, mucousy diarrhea last week. She notes resolution of her diarrhea. She did take over the counter anti-diarrheals for 1-2 days, but has not taken any the past few days. She has no other acute concerns or complaints.  ? ?Plan: ?-continue to monitor for recurrence.   ? ?Patient discussed with Dr. Daryll Drown ? ?[name] ?Internal Medicine Resident  ?

## 2021-04-09 ENCOUNTER — Other Ambulatory Visit: Payer: Self-pay

## 2021-04-09 DIAGNOSIS — I10 Essential (primary) hypertension: Secondary | ICD-10-CM

## 2021-04-09 NOTE — Progress Notes (Signed)
Internal Medicine Clinic Attending  Case discussed with Dr. Katsadouros  at the time of the visit.  We reviewed the resident's history and pertinent patient test results.  I agree with the assessment, diagnosis, and plan of care documented in the resident's note.  

## 2021-04-11 MED ORDER — OLMESARTAN MEDOXOMIL 5 MG PO TABS: 10.0000 mg | ORAL_TABLET | Freq: Every day | ORAL | 3 refills | Status: DC

## 2021-04-18 ENCOUNTER — Ambulatory Visit (INDEPENDENT_AMBULATORY_CARE_PROVIDER_SITE_OTHER): Payer: Medicare Other | Admitting: Internal Medicine

## 2021-04-18 VITALS — BP 139/42 | HR 73 | Temp 97.7°F | Ht 61.0 in | Wt 115.3 lb

## 2021-04-18 DIAGNOSIS — I5032 Chronic diastolic (congestive) heart failure: Secondary | ICD-10-CM | POA: Diagnosis not present

## 2021-04-18 DIAGNOSIS — G8929 Other chronic pain: Secondary | ICD-10-CM

## 2021-04-18 DIAGNOSIS — E1122 Type 2 diabetes mellitus with diabetic chronic kidney disease: Secondary | ICD-10-CM | POA: Diagnosis not present

## 2021-04-18 DIAGNOSIS — C049 Malignant neoplasm of floor of mouth, unspecified: Secondary | ICD-10-CM | POA: Diagnosis not present

## 2021-04-18 DIAGNOSIS — I130 Hypertensive heart and chronic kidney disease with heart failure and stage 1 through stage 4 chronic kidney disease, or unspecified chronic kidney disease: Secondary | ICD-10-CM

## 2021-04-18 DIAGNOSIS — M654 Radial styloid tenosynovitis [de Quervain]: Secondary | ICD-10-CM | POA: Diagnosis not present

## 2021-04-18 DIAGNOSIS — Z794 Long term (current) use of insulin: Secondary | ICD-10-CM

## 2021-04-18 DIAGNOSIS — D696 Thrombocytopenia, unspecified: Secondary | ICD-10-CM | POA: Diagnosis not present

## 2021-04-18 DIAGNOSIS — I739 Peripheral vascular disease, unspecified: Secondary | ICD-10-CM

## 2021-04-18 DIAGNOSIS — Z0000 Encounter for general adult medical examination without abnormal findings: Secondary | ICD-10-CM

## 2021-04-18 DIAGNOSIS — E1151 Type 2 diabetes mellitus with diabetic peripheral angiopathy without gangrene: Secondary | ICD-10-CM | POA: Diagnosis not present

## 2021-04-18 DIAGNOSIS — L409 Psoriasis, unspecified: Secondary | ICD-10-CM | POA: Diagnosis not present

## 2021-04-18 DIAGNOSIS — N1831 Chronic kidney disease, stage 3a: Secondary | ICD-10-CM | POA: Diagnosis not present

## 2021-04-18 DIAGNOSIS — M25512 Pain in left shoulder: Secondary | ICD-10-CM

## 2021-04-18 DIAGNOSIS — M25511 Pain in right shoulder: Secondary | ICD-10-CM

## 2021-04-18 DIAGNOSIS — K219 Gastro-esophageal reflux disease without esophagitis: Secondary | ICD-10-CM

## 2021-04-18 DIAGNOSIS — E7841 Elevated Lipoprotein(a): Secondary | ICD-10-CM

## 2021-04-18 DIAGNOSIS — M85859 Other specified disorders of bone density and structure, unspecified thigh: Secondary | ICD-10-CM | POA: Diagnosis not present

## 2021-04-18 DIAGNOSIS — I10 Essential (primary) hypertension: Secondary | ICD-10-CM

## 2021-04-18 DIAGNOSIS — N3941 Urge incontinence: Secondary | ICD-10-CM

## 2021-04-18 LAB — POCT GLYCOSYLATED HEMOGLOBIN (HGB A1C): Hemoglobin A1C: 7.6 % — AB (ref 4.0–5.6)

## 2021-04-18 LAB — GLUCOSE, CAPILLARY: Glucose-Capillary: 158 mg/dL — ABNORMAL HIGH (ref 70–99)

## 2021-04-18 MED ORDER — EMPAGLIFLOZIN 10 MG PO TABS: 20.0000 mg | ORAL_TABLET | Freq: Every day | ORAL | 0 refills | Status: DC

## 2021-04-18 MED ORDER — LANTUS SOLOSTAR 100 UNIT/ML ~~LOC~~ SOPN: PEN_INJECTOR | SUBCUTANEOUS | 5 refills | Status: DC

## 2021-04-18 NOTE — Assessment & Plan Note (Signed)
Continues on [place], follow-up with dermatology scheduled soon.  Her only current symptomatic area is her scalp.  She will be getting refills for her psoriasis medications at the dermatologist appointment.  ?

## 2021-04-18 NOTE — Assessment & Plan Note (Addendum)
Doing well on current regimen, A1c today 7.6.  Fasting blood sugars have been as tightly controlled as I would feel safe in her situation and while she does have hyperglycemia later in the day, there is no plan at this time for meal coverage insulin due to her desire to avoid insulin altogether and to minimize regimen complexity.  At this time we will increase her empagliflozin from 10 mg to 20 mg ( 2 x10 mg) daily (she had just gotten a refill of the 10 mg size so I will not yet changed to a 25 mg tablet until her current supply is completed).  She is very interested in losing a few more pounds and we discussed GLP-1 agent for future control to potentially replace her insulin.  I have asked her to check her blood sugar only once a day, fasting. ?

## 2021-04-18 NOTE — Assessment & Plan Note (Signed)
BP improved after increase in olmesartan from 5 to 10 mg daily; today 139/42.  Dose can be further increased if needed at future visits though not pursued today. ?

## 2021-04-18 NOTE — Assessment & Plan Note (Addendum)
Lipids at last visit 01/18/2021 TC 190 HDL 62 LDL 106.,  On rosuvastatin 20 mg daily.  Secondary prevention. ?

## 2021-04-18 NOTE — Assessment & Plan Note (Signed)
L shoulder elevation barely 90 degrees, R shoulder elevation about 110. She is having problems putting on jackets/sweaters and reaching overhead but is otherwise managing ADLs.  surgeon in the past had suggested surgery which she doesn't wish to pursue.  Conservative management.    ?

## 2021-04-18 NOTE — Assessment & Plan Note (Signed)
Asymptomatic, no dyspnea at rest, with exertion, or when supine.  No edema.  Monitor. ?

## 2021-04-18 NOTE — Assessment & Plan Note (Signed)
3x nocturia, despite voiding before bed.  Sometimes has UI.  Uses flashlight to get to BR.  Uses pads but hasn't tried pullups. Discussed limiting fluid intake after supper meal, avoiding alcohol and caffeinated beverages later in the day.  She will monitor.  Myrbetriq is a consideration though not pursuing yet given existing polypharmacy. ?

## 2021-04-18 NOTE — Assessment & Plan Note (Signed)
I cannot find details for this diagnosis.  Will keep investigating. Not active problem. ?

## 2021-04-18 NOTE — Assessment & Plan Note (Signed)
>>  ASSESSMENT AND PLAN FOR SHOULDER PAIN, BILATERAL WRITTEN ON 04/18/2021  2:51 PM BY Angelica Pou, MD  L shoulder elevation barely 90 degrees, R shoulder elevation about 110. She is having problems putting on jackets/sweaters and reaching overhead but is otherwise managing ADLs.  surgeon in the past had suggested surgery which she doesn't wish to pursue.  Conservative management.

## 2021-04-18 NOTE — Progress Notes (Signed)
81 year old [name] (T2DM, CKD 3, HTN, PAD, osteopenia, psoriasis among others) presents for routine 48-monthfollow-up in addition to recheck of an acute diarrheal illness (completely resolved, no concerns) within the past couple of weeks.  We just became acquainted with the last visit as I was newly assigned as her PCP. ? ?At last visit, BP was uncontrolled and her olmesartan was increased from 5 to 10 mg daily.  She was to continue her amlodipine 10 mg daily.   ? ?A1c was 8, and she had resumed [name] which was discontinued after a period of time due to to possible urinary symptoms.  She was to be monitoring response to restarting, and today reports that she has had no dysuria.  Taking 43 units nightly Lantus; infrequent lows, but this morning had asymptomatic 90s, 70s last week fasting.  Sometimes does have symptoms.  Interested in changing to Trulicity once weekly in order to get off lantus.  "I'm tired of poking myself".  ? ?Doing ok, shoulders chronically hurt and surgeon in the past had suggested surgery which she doesn't wish to pursue.  Putting on jackets or sweaters or reaching overhead are uncomfortable.  She is also experiencing pain at the base of her right thumb. ? ?Has appt with derm next month, needs refills on topicals.  Body doing ok, scalp very itchy - topicals not helping.   Selsun Blue ineffective.   ? ?SFonsecalives alone and has been functioning well independently.  She has 2 supportive daughters in the area and [place]. ? ?ROS:  Sleeping ok, no headaches; appetite is not as strong as it used to be but she continues to eat well.  Does have constipation chroically.  Was taking a stool softener.   Oving bowels 2-0 x/daily, feels uncomfortable if more than two days (hard to pass).  3x nocturia, despite voiding before bed.  Sometimes has UI.  Uses flashlight to get to BR.  Uses pads but hasn't tried pullups.  She'll be watching what she  drinks.No problems thinking or concentrating, no significant problems with memory.   ? ?BP (!) 139/42 (BP Location: Right Arm, Patient Position: Sitting, Cuff Size: Small)   Pulse 73   Temp 97.7 ?F (36.5 ?C) (Oral)   Ht '5\' 1"'$  (1.549 m)   Wt 115 lb 4.8 oz (52.3 kg)   SpO2 99%   BMI 21.79 kg/m?  ? ?L shoulder elevation barely 90 degrees, R shoulder elevation about 110.  R thumb extensor tendon tender and pain with extension.  Grips becoming impaired due to arthritis.  Feet -sensation intact to monofilament bilaterally, absent pulses, toes pink and warm, skin in good condition. ? ?Heart early systolic murmur over LLSB. Lungs clear.  No LE edema.   ? ?Tenderness over R thumb extensor tendon, pain with thumb extension.  Area is very mildly swollen with minimally increased temp compared to L side.   ? ?Assessment and plan: ? ?[name] doing relatively well, her most impactful symptom to being joint pain.  Diabetes is under good control and we will be moving toward a simplified regimen perhaps not involving insulin.  She has osteopenia which has not been evaluated since 2016 and will undergo a DEXA as well as check vitamin D today.  See problem based documentation for details.  Follow-up in 3 months for chronic condition management. ? ? ? ?

## 2021-04-18 NOTE — Assessment & Plan Note (Signed)
Asymptomatic unless she eats something spicy.  We discussed going to prn with the PPI to avoid long term complications (I.e. osteoporosis) - she is willing to try.   ?

## 2021-04-18 NOTE — Assessment & Plan Note (Addendum)
Data reviewed, she is overdue for DEXA, increased risk family history (mother hip fracture), long term PPI, age).  She agrees to [employer] and conversation about treatment.  Check vit D. ?

## 2021-04-18 NOTE — Assessment & Plan Note (Addendum)
Abnormal foot sensation of her feet which she can't describe.  She doesn't endorse neuropathy sxs per se, though difficult to elucidate.  Currentlly the longest distance she walks is down our hospital hall, during which she experiences bilateral claudication, relieved with rest.   Her feet do not get cold at night and no claudication at rest.  We discussed the benefit of walking for exercise- walking to the point of discomfort, resting till resolved, then resuming and repeating this pattern.  Continue antiplatelet therapy and risk factor management.  She has no limb threatening findings. ?

## 2021-04-18 NOTE — Patient Instructions (Signed)
Ms. Principato, ? ?I had an opportunity to catch up today.  We discussed many things ? ?I am increasing your [name] to 20 mg, which means you will take 2 of the 10 mg pills together every morning.  At next visit, I will increase your prescription to the 25 mg pill, once daily.  I am also decreasing your Lantus insulin to 40 units daily.  You will only need to check your blood sugar once daily, fasting in the mornings. ? ?Blood pressure is a little bit higher than our goal, we will not make a change today and recheck next visit. ? ?We discussed recommendation to begin taking your pantoprazole (for acid reflux) on an as-needed basis, just when you are having symptoms of heartburn.  Pantoprazole and medicines like it can lead to osteoporosis which is detailed little bone disease we want to avoid.  Let me know how it does, and if you need to take it every day again that is just fine. ? ?Speaking of bone health, it is time for you to have your bone density checked and I have ordered that test to see if it could be coordinated at the same time as her mammogram. ? ?For pain of your right wrist and any finger pain from arthritis, and even in your shoulders, you may get a medication called Voltaren gel over-the-counter.  Ask your pharmacist to help you find an anti-inflammatory to rub on the skin and use as directed. ? ?Get together in 3 months for some blood work and to see how you are doing! ? ?Take care and stay well, and enjoy your [place] weekend in [place]. ? ?Dr. Jimmye Norman ?

## 2021-05-16 DIAGNOSIS — M25811 Other specified joint disorders, right shoulder: Secondary | ICD-10-CM | POA: Diagnosis not present

## 2021-05-16 DIAGNOSIS — M25511 Pain in right shoulder: Secondary | ICD-10-CM | POA: Diagnosis not present

## 2021-05-21 ENCOUNTER — Ambulatory Visit
Admission: RE | Admit: 2021-05-21 | Discharge: 2021-05-21 | Disposition: A | Discharge status: Home / Self Care | Payer: Medicare Other | Source: Ambulatory Visit | Attending: Internal Medicine | Admitting: Internal Medicine

## 2021-05-21 DIAGNOSIS — Z0000 Encounter for general adult medical examination without abnormal findings: Secondary | ICD-10-CM

## 2021-05-21 DIAGNOSIS — Z1231 Encounter for screening mammogram for malignant neoplasm of breast: Secondary | ICD-10-CM | POA: Diagnosis not present

## 2021-06-25 DIAGNOSIS — D3131 Benign neoplasm of right choroid: Secondary | ICD-10-CM | POA: Diagnosis not present

## 2021-06-25 DIAGNOSIS — H02831 Dermatochalasis of right upper eyelid: Secondary | ICD-10-CM | POA: Diagnosis not present

## 2021-06-25 DIAGNOSIS — H26493 Other secondary cataract, bilateral: Secondary | ICD-10-CM | POA: Diagnosis not present

## 2021-06-25 DIAGNOSIS — H04123 Dry eye syndrome of bilateral lacrimal glands: Secondary | ICD-10-CM | POA: Diagnosis not present

## 2021-06-25 DIAGNOSIS — E119 Type 2 diabetes mellitus without complications: Secondary | ICD-10-CM | POA: Diagnosis not present

## 2021-06-25 DIAGNOSIS — Z961 Presence of intraocular lens: Secondary | ICD-10-CM | POA: Diagnosis not present

## 2021-06-25 DIAGNOSIS — H02834 Dermatochalasis of left upper eyelid: Secondary | ICD-10-CM | POA: Diagnosis not present

## 2021-06-25 LAB — HM DIABETES EYE EXAM

## 2021-07-02 ENCOUNTER — Ambulatory Visit (INDEPENDENT_AMBULATORY_CARE_PROVIDER_SITE_OTHER): Payer: Medicare Other | Admitting: Internal Medicine

## 2021-07-02 DIAGNOSIS — Z0000 Encounter for general adult medical examination without abnormal findings: Secondary | ICD-10-CM

## 2021-07-02 NOTE — Progress Notes (Unsigned)
Subjective:   [name] is a 81 y.o. female who presents for an Initial Medicare Annual Wellness Visit. I connected with  [name] on 07/03/21 by a audio enabled telemedicine application and verified that I am speaking with the correct person using two identifiers.  Patient Location: Home  Provider Location: Office/Clinic  I discussed the limitations of evaluation and management by telemedicine. The patient expressed understanding and agreed to proceed.  Review of Systems    Defer to PCP       Objective:    Today's Vitals   07/02/21 1332  PainSc: 4    There is no height or weight on file to calculate BMI.     07/02/2021    1:40 PM 04/18/2021   11:13 AM 01/18/2021   10:25 AM 07/18/2020   10:07 AM 07/04/2020    2:23 PM 04/03/2020   10:46 AM 01/15/2020    5:52 PM  Advanced Directives  Does Patient Have a Medical Advance Directive? _0  No No;Yes  Type of Paramedic of [place];Living will Out of facility DNR (pink MOST or yellow form);[place];Living will Out of facility DNR (pink MOST or yellow form);Living will;Healthcare Power of [place];Living will;Out of facility DNR (pink MOST or yellow form) [place];Living will  Living will  Does patient want to make changes to medical advance directive?  No - Patient declined No - Patient declined  No - Patient declined    Copy of [place] in Chart? Yes - validated most recent copy scanned in chart (See row information) Yes - validated most recent copy scanned in chart (See row information) Yes - validated most recent copy scanned in chart (See row information)  Yes - validated most recent copy scanned in chart (See row information)  Yes - validated most recent copy scanned in chart (See row information)  Would patient like information on creating a medical advance directive?      No - Patient  declined     Current Medications (verified) Outpatient Encounter Medications as of 07/02/2021  Medication Sig   amLODipine (NORVASC) 10 MG tablet TAKE 1 TABLET(10 MG) BY MOUTH DAILY   Apremilast (OTEZLA) 30 MG TABS Take 1 tablet by mouth in the morning and at bedtime.   aspirin EC 81 MG tablet Take 1 tablet (81 mg total) by mouth daily.   Blood Glucose Monitoring Suppl (ACCU-CHEK GUIDE ME) w/Device KIT 1 each by Does not apply route 3 (three) times daily.   Calcium Carbonate-Vitamin D (CALCIUM 600+D) 600-400 MG-UNIT tablet Take 1 tablet by mouth daily.   clopidogrel (PLAVIX) 75 MG tablet TAKE 1 TABLET(75 MG) BY MOUTH DAILY   empagliflozin ([name]) 10 MG TABS tablet Take 2 tablets (20 mg total) by mouth daily before breakfast.   glucose blood (ACCU-CHEK GUIDE) test strip Check blood sugar 3 times a day   insulin glargine (LANTUS SOLOSTAR) 100 UNIT/ML Solostar Pen ADMINISTER 40 UNITS UNDER THE SKIN AT BEDTIME   Insulin Pen Needle (BD PEN NEEDLE NANO 2ND GEN) 32G X 4 MM MISC USE TO INJECT TWICE DAILY AS DIRECTED   Lancets (FREESTYLE) lancets USE TO CHECK BLOOD SUGAR FOUR TIMES DAILY- BEFORE MEALS AND EVERY NIGHT AT BEDTIME   melatonin 5 MG TABS Take 5 mg by mouth at bedtime.   nitroGLYCERIN (NITROSTAT) 0.3 MG SL tablet Place 1 tablet (0.3 mg total) under the tongue every 5 (five) minutes  as needed for chest pain.   olmesartan (BENICAR) 5 MG tablet Take 2 tablets (10 mg total) by mouth daily.   pantoprazole (PROTONIX) 20 MG tablet Take 1 tablet (20 mg total) by mouth daily.   rosuvastatin (CRESTOR) 20 MG tablet Take 1 tablet (20 mg total) by mouth daily.   [DISCONTINUED] Calcium Carb-Cholecalciferol (CALCIUM 600 + D PO) Take 1 tablet by mouth 2 (two) times daily.   No facility-administered encounter medications on file as of 07/02/2021.    Allergies (verified) Valsartan   History: Past Medical History:  Diagnosis Date   Acute colitis 10/14/2018   Bloody diarrhea 10/13/2018   CAD  (coronary artery disease) 2006   s/p Quadrupal CABG 2006   CHF (congestive heart failure) ([place]) 2010   EF 30-35% from Echo 06/2008   CKD (chronic kidney disease) stage 4, GFR 15-29 ml/min (HCC)    fluctuating between stage 3 and 4 depending on GFR   CKD (chronic kidney disease), stage III ([place]) 09/27/2013   De Quervain's tenosynovitis, left 11/13/2015   Diverticulosis    DIVERTICULOSIS OF COLON 04/01/2007   Qualifier: Diagnosis of  By: Harlon Ditty CMA (AAMA), [name]     DM (diabetes mellitus) ([place])    insulin dependent   Dyslipidemia 10/15/2005   Qualifier: Diagnosis of  By: Derrel Nip MD, Helene Kelp     History of colonic polyps    HLD (hyperlipidemia)    HTN (hypertension)    Lumbar spinal stenosis    MI (myocardial infarction) ([place]) 2003   . [place]   Mouth ulcer adjacent to jaw bone 08/22/2010   S/p removal by ENT 09/2010    MYOCARDIAL INFARCTION, HX OF 09/09/2006   Qualifier: Diagnosis of  By: Marinda Elk MD, Sonia Side     Nephrolithiasis    hx of   NEPHROLITHIASIS 04/01/2007   Qualifier: Diagnosis of  By: Harlon Ditty CMA (AAMA), [name]     OA (osteoarthritis)    Osteopenia 2010   T score of -1.8   Pancreatic cysts, - look benign by MR 04/25/2019   Psoriasis    PVD (peripheral vascular disease) (HCC)    ABI indicated moderated reduction arterial flow on the left.   Tricuspid regurgitation 2009   moderate to severe, EF 55%   Past Surgical History:  Procedure Laterality Date   CATARACT EXTRACTION  2008   left eye   COLONOSCOPY  2008   CORONARY ANGIOPLASTY WITH STENT PLACEMENT  2003   CORONARY ARTERY BYPASS GRAFT  2006   4 vessel   LAPAROSCOPIC SUPRACERVICAL HYSTERECTOMY     PERIPHERAL VASCULAR CATHETERIZATION N/A 07/03/2014   Procedure: Abdominal Aortogram w/Lower Extremity;  Surgeon: Angelia Mould, MD;  Location: [facility];  Service: Cardiovascular;  Laterality: N/A;   Family History  Problem Relation Age of Onset   Cirrhosis Brother    Heart disease Brother     Heart attack Brother    Hypertension Mother    Hyperlipidemia Mother    Hyperlipidemia Son    Hypertension Son    Breast cancer Sister    Breast cancer Daughter    Social History   Socioeconomic History   Marital status: Widowed    Spouse name: Not on file   Number of children: Not on file   Years of education: Not on file   Highest education level: Not on file  Occupational History   Not on file  Tobacco Use   Smoking status: Never   Smokeless tobacco: Never  Vaping Use   Vaping  Use: Never used  Substance and Sexual Activity   Alcohol use: No    Alcohol/week: 0.0 standard drinks of alcohol   Drug use: Not Currently   Sexual activity: Not on file  Other Topics Concern   Not on file  Social History Narrative   Retired Event organiser   Widow/Widower   lives in [place]   4 kids (2 sons, 2 daughters) one son died many years ago in car accident.  other kids are all in [place].   Has friend that helps take care of her   Social Determinants of Health   Financial Resource Strain: Low Risk  (07/02/2021)   Overall Financial Resource Strain (CARDIA)    Difficulty of Paying Living Expenses: Not hard at all  Food Insecurity: No Food Insecurity (07/02/2021)   Hunger Vital Sign    Worried About Running Out of Food in the Last Year: Never true    Ran Out of Food in the Last Year: Never true  Transportation Needs: No Transportation Needs (07/02/2021)   PRAPARE - Hydrologist (Medical): No    Lack of Transportation (Non-Medical): No  Physical Activity: Inactive (07/02/2021)   Exercise Vital Sign    Days of Exercise per Week: 0 days    Minutes of Exercise per Session: 0 min  Stress: No Stress Concern Present (07/02/2021)   [place]    Feeling of Stress : Not at all  Social Connections: Unknown (07/02/2021)   Social Connection and Isolation Panel [NHANES]    Frequency  of Communication with Friends and Family: Twice a week    Frequency of Social Gatherings with Friends and Family: Not on file    Attends Religious Services: 1 to 4 times per year    Active Member of [employer] or Organizations: Yes    Attends Archivist Meetings: Not on file    Marital Status: Widowed    Tobacco Counseling Counseling given: Not Answered   Clinical Intake:  Pre-visit preparation completed: Yes  Pain : 0-10 Pain Score: 4  Pain Type: Chronic pain Pain Location: Shoulder Pain Orientation: Right, Left Pain Descriptors / Indicators: Aching Pain Onset: More than a month ago Pain Frequency: Constant Effect of Pain on Daily Activities: pain during movement     Nutritional Risks: None Diabetes: Yes  How often do you need to have someone help you when you read instructions, pamphlets, or other written materials from your doctor or pharmacy?: 1 - Never What is the last grade level you completed in school?: 11th grade  Diabetic?Yes  Interpreter Needed?: No  Information entered by :: Corey Skains Katerina Zurn,cma 07/02/21 1:34pm   Activities of Daily Living    07/02/2021    1:40 PM 04/18/2021   11:05 AM  In your present state of health, do you have any difficulty performing the following activities:  Hearing? 0 0  Vision? 1 0  Difficulty concentrating or making decisions? 0 0  Walking or climbing stairs? 0 0  Dressing or bathing? 0 0  Doing errands, shopping? 0 0    Patient Care Team: Angelica Pou, MD as PCP - General (Internal Medicine) Rexene Agent, MD as Attending Physician (Nephrology)  Indicate any recent Medical Services you may have received from other than [facility] providers in the past year (date may be approximate).     Assessment:   This is a routine wellness examination for Amonie.  Hearing/Vision screen No results  found.  Dietary issues and exercise activities discussed:     Goals Addressed   None   Depression Screen    07/02/2021     1:36 PM 04/18/2021   11:06 AM 03/25/2021    1:15 PM 01/18/2021   10:40 AM 07/18/2020   10:06 AM 07/04/2020    2:29 PM 09/14/2019    3:46 PM  PHQ 2/9 Scores  PHQ - 2 Score 0 0 0 0 0 0 0  PHQ- 9 Score    0 0  1    Fall Risk    07/02/2021    1:40 PM 04/18/2021   11:05 AM 03/25/2021    1:15 PM 01/18/2021   10:26 AM 07/18/2020   10:06 AM  Fall Risk   Falls in the past year? 0 0 0 0 1  Number falls in past yr: 0 0  0 0  Injury with Fall? 0 0  0 1  Risk for fall due to :     Impaired balance/gait  Follow up Falls evaluation completed;Falls prevention discussed Falls evaluation completed Falls evaluation completed Falls evaluation completed     FALL RISK PREVENTION PERTAINING TO THE HOME:  Any stairs in or around the home? Yes  If so, are there any without handrails? No  Home free of loose throw rugs in walkways, pet beds, electrical cords, etc? Yes  Adequate lighting in your home to reduce risk of falls? Yes   ASSISTIVE DEVICES UTILIZED TO PREVENT FALLS:  Life alert? No  Use of a cane, walker or w/c? No  Grab bars in the bathroom? Yes  Shower chair or bench in shower? No  Elevated toilet seat or a handicapped toilet? Yes   TIMED UP AND GO:  Was the test performed? No .  Length of time to ambulate 10 feet: N/A sec.   Gait slow and steady without use of assistive device  Cognitive Function:        07/02/2021    1:41 PM  6CIT Screen  What Year? 0 points  What month? 0 points  What time? 0 points  Count back from 20 0 points  Months in reverse 0 points  Repeat phrase 2 points  Total Score 2 points    Immunizations Immunization History  Administered Date(s) Administered   H1N1 12/23/2007   Influenza Split 11/14/2010, 10/09/2011   Influenza Whole 10/13/2005, 10/26/2006, 12/23/2007, 10/03/2008, 09/27/2009   Influenza, High Dose Seasonal PF 01/18/2021   Influenza,inj,Quad PF,6+ Mos 09/27/2013, 10/27/2014, 09/19/2015, 10/22/2016, 10/14/2017, 11/17/2018, 09/14/2019, 12/27/2019    Moderna Sars-Covid-2 Vaccination 03/23/2019, 04/20/2019   Pneumococcal Conjugate-13 12/27/2013   Pneumococcal Polysaccharide-23 10/18/2001, 10/26/2006, 10/22/2016   Tdap 10/09/2011    TDAP status: Up to date  Flu Vaccine status: Up to date  Pneumococcal vaccine status: Up to date  Covid-19 vaccine status: Completed vaccines  Qualifies for Shingles Vaccine? No   Zostavax completed No   Shingrix Completed?: Yes  Screening Tests Health Maintenance  Topic Date Due   Zoster Vaccines- Shingrix (1 of 2) Never done   COVID-19 Vaccine (3 - Moderna risk series) 05/18/2019   OPHTHALMOLOGY EXAM  06/19/2021   HEMOGLOBIN A1C  07/18/2021   INFLUENZA VACCINE  08/13/2021   TETANUS/TDAP  10/08/2021   LIPID PANEL  01/18/2022   FOOT EXAM  04/19/2022   Pneumonia Vaccine 14+ Years old  Completed   DEXA SCAN  Completed   HPV VACCINES  Aged Out    Health Maintenance  Health Maintenance Due  Topic Date Due  Zoster Vaccines- Shingrix (1 of 2) Never done   COVID-19 Vaccine (3 - Moderna risk series) 05/18/2019   OPHTHALMOLOGY EXAM  06/19/2021     Mammogram status: Completed 05/21/21. Repeat every year:1   Lung Cancer Screening: (Low Dose CT Chest recommended if Age 27-80 years, 30 pack-year currently smoking OR have quit w/in 15years.) does not qualify.   Lung Cancer Screening Referral: N/A  Additional Screening:  Hepatitis C Screening: does not qualify;   Vision Screening: Recommended annual ophthalmology exams for early detection of glaucoma and other disorders of the eye. Is the patient up to date with their annual eye exam?  Yes  Who is the provider or what is the name of the office in which the patient attends annual eye exams? Dr.Groat If pt is not established with a provider, would they like to be referred to a provider to establish care? No .   Dental Screening: Recommended annual dental exams for proper oral hygiene  Community Resource Referral / Chronic Care  Management: CRR required this visit?  No   CCM required this visit?  No      Plan:     I have personally reviewed and noted the following in the patient's chart:   Medical and social history Use of alcohol, tobacco or illicit drugs  Current medications and supplements including opioid prescriptions. Patient is not currently taking opioid prescriptions. Functional ability and status Nutritional status Physical activity Advanced directives List of other physicians Hospitalizations, surgeries, and ER visits in previous 12 months Vitals Screenings to include cognitive, depression, and falls Referrals and appointments  In addition, I have reviewed and discussed with patient certain preventive protocols, quality metrics, and best practice recommendations. A written personalized care plan for preventive services as well as general preventive health recommendations were provided to patient.     Kerin Perna, [facility]   07/03/2021   Nurse Notes: Non Face-to-Face 13 minute visit  [name] , Thank you for taking time to come for your Medicare Wellness Visit. I appreciate your ongoing commitment to your health goals. Please review the following plan we discussed and let me know if I can assist you in the future.   These are the goals we discussed:  Goals      Blood Pressure < 140/90     HDL > 40     HEMOGLOBIN A1C < 8.0     Increase physical activity     LDL CALC < 100     Reduce portion size        This is a list of the screening recommended for you and due dates:  Health Maintenance  Topic Date Due   Zoster (Shingles) Vaccine (1 of 2) Never done   COVID-19 Vaccine (3 - Moderna risk series) 05/18/2019   Eye exam for diabetics  06/19/2021   Hemoglobin A1C  07/18/2021   Flu Shot  08/13/2021   Tetanus Vaccine  10/08/2021   Lipid (cholesterol) test  01/18/2022   Complete foot exam   04/19/2022   Pneumonia Vaccine  Completed   DEXA scan (bone density measurement)  Completed    HPV Vaccine  Aged Out

## 2021-07-04 DIAGNOSIS — Z79899 Other long term (current) drug therapy: Secondary | ICD-10-CM | POA: Diagnosis not present

## 2021-07-04 DIAGNOSIS — L400 Psoriasis vulgaris: Secondary | ICD-10-CM | POA: Diagnosis not present

## 2021-07-04 DIAGNOSIS — L570 Actinic keratosis: Secondary | ICD-10-CM | POA: Diagnosis not present

## 2021-07-25 ENCOUNTER — Ambulatory Visit (INDEPENDENT_AMBULATORY_CARE_PROVIDER_SITE_OTHER): Payer: Medicare Other

## 2021-07-25 VITALS — BP 133/63 | HR 67 | Temp 97.8°F | Ht 61.0 in | Wt 113.0 lb

## 2021-07-25 DIAGNOSIS — E1122 Type 2 diabetes mellitus with diabetic chronic kidney disease: Secondary | ICD-10-CM

## 2021-07-25 DIAGNOSIS — I10 Essential (primary) hypertension: Secondary | ICD-10-CM

## 2021-07-25 DIAGNOSIS — Z794 Long term (current) use of insulin: Secondary | ICD-10-CM

## 2021-07-25 DIAGNOSIS — I129 Hypertensive chronic kidney disease with stage 1 through stage 4 chronic kidney disease, or unspecified chronic kidney disease: Secondary | ICD-10-CM

## 2021-07-25 DIAGNOSIS — N1831 Chronic kidney disease, stage 3a: Secondary | ICD-10-CM | POA: Diagnosis not present

## 2021-07-25 DIAGNOSIS — J208 Acute bronchitis due to other specified organisms: Secondary | ICD-10-CM

## 2021-07-25 LAB — POCT GLYCOSYLATED HEMOGLOBIN (HGB A1C): Hemoglobin A1C: 7.2 % — AB (ref 4.0–5.6)

## 2021-07-25 LAB — GLUCOSE, CAPILLARY: Glucose-Capillary: 189 mg/dL — ABNORMAL HIGH (ref 70–99)

## 2021-07-25 MED ORDER — EMPAGLIFLOZIN 25 MG PO TABS: 25.0000 mg | ORAL_TABLET | Freq: Every day | ORAL | 2 refills | Status: DC

## 2021-07-25 NOTE — Assessment & Plan Note (Signed)
Patient presents with 1 week of productive cough with clear sputum. There is no associated fever, chest pain, SOB, orthopnea, pnd, LE edema, weight gain. On exam, the patient has no sinus tenderness, nares and oropharynx are clear, lungs have good air flow with bronchial breath sounds and no crackles, no LE edema. Vitals were wnl. Given no fever, noacute respiratory distress, no CP and no crackles , this is not consistent with pneumonia. This is also not consistent with HF given no LE edema, no weight changes, no SOB, and no signs of volume overload on lung exam. In the setting of cough with sputum that is improving, bronchial breath sounds and otherwise normal lung exam, stable vitals, this is likely viral bronchitis. Counseled patient that cough may last for a few weeks. - Symptom management with mucinex or Corcidin HBP -

## 2021-07-25 NOTE — Patient Instructions (Signed)
Thank you, Ms.Sanayah Tristan Schroeder for allowing [place] to provide your care today. Today we discussed :  Cough- You have a cough that is most likely due to a virus. You do not have a fever or shortness of breath and your lungs sound good which makes [place] think this is a viral respiratory tract infection instead of a bacterial pneumonia. You do not need antibiotics for viral infections. You can keep taking mucinex as needed. Please stop taking coricidin as it can raise blood pressure. If you like the coricidin then use the coricidin HBP as this is made for people with higher blood pressure and does not affect it. This cough may persist for a few weeks. Please follow up if you get a fever, shortness of breath or chest pain.  Diabetes- Your blood sugar is doing well and your A1c was improved from 7.6% to 7.2%. We will continue to check this every 3 months.  I have ordered the following labs for you:   Lab Orders         Glucose, capillary         POC Hbg A1C         Follow up: 3 months     We look forward to seeing you next time. Please call our clinic at [phone] if you have any questions or concerns. The best time to call is Monday-Friday from 9am-4pm, but there is someone available 24/7. If after hours or the weekend, call the main hospital number and ask for the Internal Medicine Resident On-Call. If you need medication refills, please notify your pharmacy one week in advance and they will send [place] a request.   Thank you for trusting me with your care. Wishing you the best!   Iona Coach, MD [place]

## 2021-07-25 NOTE — Progress Notes (Signed)
Established Patient Office Visit  Subjective   Patient ID: [name], female    DOB: 12/01/1940  Age: 81 y.o. MRN: [id number]  Chief Complaint  Patient presents with   Cough   Nasal Congestion    Ms. Siebers is a 81 y/o female with a pmh outlined below who presents with over 1 week of cough. The cough seems to be present throughout the day and is productive of clear sputum, which the patient mentions was yellowish a few days ago. There is no associated fever, chills, headache,sneezing, runny nose, sob, cp, N/V, diarhea, myalgia. She has started using 3 pillows instead of 2 but says this is not for breathing but rather because 2 pillows causes her neck to hurt.She has tried mucinex and corocidin hbp with minor resolution. She says that she feels she is starting to get better.The patient denies any sick contacts. She has had 4 x COVID vaccinations.  Cough Pertinent negatives include no chest pain, chills, fever, headaches, myalgias, sore throat or shortness of breath.      Review of Systems  Constitutional:  Positive for malaise/fatigue. Negative for chills and fever.  HENT:  Positive for congestion. Negative for sinus pain and sore throat.   Respiratory:  Positive for cough and sputum production. Negative for shortness of breath.   Cardiovascular:  Negative for chest pain, palpitations, orthopnea, leg swelling and PND.  Gastrointestinal:  Negative for abdominal pain, diarrhea, nausea and vomiting.  Genitourinary:  Negative for dysuria and urgency.  Musculoskeletal:  Negative for myalgias.  Neurological:  Negative for headaches.      Objective:     BP 133/63 (BP Location: Right Arm, Patient Position: Sitting, Cuff Size: Small)   Pulse 67   Temp 97.8 F (36.6 C) (Oral)   Ht '5\' 1"'$  (1.549 m)   Wt 113 lb (51.3 kg)   SpO2 95%   BMI 21.35 kg/m    Physical Exam Constitutional:      General: She is not in acute distress.    Appearance: Normal appearance. She is  normal weight. She is not ill-appearing.  HENT:     Nose: No congestion or rhinorrhea.     Mouth/Throat:     Mouth: Mucous membranes are moist.     Pharynx: Oropharynx is clear.  Eyes:     Conjunctiva/sclera: Conjunctivae normal.  Cardiovascular:     Rate and Rhythm: Normal rate.     Pulses: Normal pulses.     Heart sounds: Normal heart sounds. No murmur heard. Pulmonary:     Effort: Pulmonary effort is normal. No respiratory distress.     Breath sounds: No rales.     Comments: Good air movement, stray wheezes, bronchial breath sounds Abdominal:     General: Bowel sounds are normal.  Skin:    General: Skin is warm and dry.     Capillary Refill: Capillary refill takes more than 3 seconds.     Comments: Poor skin turgor at the dorsal hand and chest  Neurological:     Mental Status: She is alert.      Results for orders placed or performed in visit on 07/25/21  Glucose, capillary  Result Value Ref Range   Glucose-Capillary 189 (H) 70 - 99 mg/dL  POC Hbg A1C  Result Value Ref Range   Hemoglobin A1C 7.2 (A) 4.0 - 5.6 %   HbA1c POC (<> result, manual entry)     HbA1c, POC (prediabetic range)     HbA1c, POC (controlled diabetic  range)        The ASCVD Risk score (Arnett DK, et al., 2019) failed to calculate for the following reasons:   The 2019 ASCVD risk score is only valid for ages 15 to 81    Assessment & Plan:   Problem List Items Addressed This Visit       Cardiovascular and Mediastinum   Essential hypertension (Chronic)    BP in clinic found to be 133/63, slightly above goal of <130/80. In the setting of acute illness, will not make any adjustments today and will reassess at follow up visit. -Continue Olmesartan 10 mg daily        Respiratory   Acute viral bronchitis    Patient presents with 1 week of productive cough with clear sputum. There is no associated fever, chest pain, SOB, orthopnea, pnd, LE edema, weight gain. On exam, the patient has no sinus  tenderness, nares and oropharynx are clear, lungs have good air flow with bronchial breath sounds and no crackles, no LE edema. Vitals were wnl. Given no fever, noacute respiratory distress, no CP and no crackles , this is not consistent with pneumonia. This is also not consistent with HF given no LE edema, no weight changes, no SOB, and no signs of volume overload on lung exam. In the setting of cough with sputum that is improving, bronchial breath sounds and otherwise normal lung exam, stable vitals, this is likely viral bronchitis. Counseled patient that cough may last for a few weeks. - Symptom management with mucinex or Corcidin HBP -        Endocrine   Type 2 diabetes mellitus with stage 3 chronic kidney disease, with long-term current use of insulin (HCC) - Primary (Chronic)    Doing well on current regimen, A1c today 7.2.  Fasting blood sugars have been as tightly controlled as I would feel safe in her situation and while she does have hyperglycemia later in the day, there is no plan at this time for meal coverage insulin due to her desire to avoid insulin altogether and to minimize regimen complexity. She is very interested in losing a few more pounds and we discussed GLP-1 agent for future control to potentially replace her insulin.  I have asked her to check her blood sugar only once a day, fasting. -empaglaflozin 20 mg, prescribe 25 mg tablets - lantus 40 u nightly -consider GLP 1 for additional glycemic control and weight loss      Relevant Orders   POC Hbg A1C (Completed)    No follow-ups on file.    Iona Coach, MD

## 2021-07-25 NOTE — Addendum Note (Signed)
Addended by: Edwyna Perfect on: 07/25/2021 04:59 PM   Modules accepted: Orders

## 2021-07-25 NOTE — Assessment & Plan Note (Signed)
Doing well on current regimen, A1c today 7.2.  Fasting blood sugars have been as tightly controlled as I would feel safe in her situation and while she does have hyperglycemia later in the day, there is no plan at this time for meal coverage insulin due to her desire to avoid insulin altogether and to minimize regimen complexity. She is very interested in losing a few more pounds and we discussed GLP-1 agent for future control to potentially replace her insulin.  I have asked her to check her blood sugar only once a day, fasting. -empaglaflozin 20 mg, prescribe 25 mg tablets - lantus 40 u nightly -consider GLP 1 for additional glycemic control and weight loss

## 2021-07-25 NOTE — Assessment & Plan Note (Signed)
BP in clinic found to be 133/63, slightly above goal of <130/80. In the setting of acute illness, will not make any adjustments today and will reassess at follow up visit. -Continue Olmesartan 10 mg daily

## 2021-07-25 NOTE — Progress Notes (Deleted)
T2DM: A1C 04/2021 7.6. -empaglaflozin 20 mg, prescribe 25 mg tablets - lantus 40 u nightly -consider GLP 1 for additional glycemic control and weight loss  HTN: BP goal <093/23. 557 systolic -olmesartan '10mg'$ , can be increased -amlodipine 10 mg  CADMR: s/p CABG -plavix 75 mg daily -nitro 0.3 SL prn  Stage 3 CKD  HLD: Lipids at last visit 01/18/2021 TC 190 HDL 62 LDL 106. -rosuvastatin 20 mg daily.   -LDL <100 goal  psoriasis  HCM: -shingrix complete -pneumococcal complete -DEXA complete

## 2021-08-01 ENCOUNTER — Encounter: Payer: Self-pay | Admitting: Dietician

## 2021-08-03 NOTE — Progress Notes (Signed)
Internal Medicine Clinic Attending  I saw and evaluated the patient.  I personally confirmed the key portions of the history and exam documented by Dr. Rogers and I reviewed pertinent patient test results.  The assessment, diagnosis, and plan were formulated together and I agree with the documentation in the resident's note.  

## 2021-08-30 ENCOUNTER — Other Ambulatory Visit: Payer: Self-pay | Admitting: Internal Medicine

## 2021-08-30 DIAGNOSIS — Z794 Long term (current) use of insulin: Secondary | ICD-10-CM

## 2021-08-30 NOTE — Telephone Encounter (Signed)
Refill Request-Pt wants to know if she can get a 90 days refill   insulin glargine (LANTUS SOLOSTAR) 100 UNIT/ML Solostar Pen  empagliflozin ([name]) 25 MG TABS tablet  [id number] - [place], [place] - [address] AT [address]

## 2021-09-02 MED ORDER — EMPAGLIFLOZIN 25 MG PO TABS: 25.0000 mg | ORAL_TABLET | Freq: Every day | ORAL | 3 refills | Status: DC

## 2021-09-02 MED ORDER — LANTUS SOLOSTAR 100 UNIT/ML ~~LOC~~ SOPN: PEN_INJECTOR | SUBCUTANEOUS | 4 refills | Status: DC

## 2021-09-26 ENCOUNTER — Ambulatory Visit (INDEPENDENT_AMBULATORY_CARE_PROVIDER_SITE_OTHER): Payer: Medicare Other | Admitting: Internal Medicine

## 2021-09-26 VITALS — BP 145/53 | HR 70 | Temp 97.9°F | Ht 61.0 in | Wt 116.3 lb

## 2021-09-26 DIAGNOSIS — E1122 Type 2 diabetes mellitus with diabetic chronic kidney disease: Secondary | ICD-10-CM | POA: Diagnosis not present

## 2021-09-26 DIAGNOSIS — M85859 Other specified disorders of bone density and structure, unspecified thigh: Secondary | ICD-10-CM | POA: Diagnosis not present

## 2021-09-26 DIAGNOSIS — I130 Hypertensive heart and chronic kidney disease with heart failure and stage 1 through stage 4 chronic kidney disease, or unspecified chronic kidney disease: Secondary | ICD-10-CM | POA: Diagnosis not present

## 2021-09-26 DIAGNOSIS — E7841 Elevated Lipoprotein(a): Secondary | ICD-10-CM

## 2021-09-26 DIAGNOSIS — N3941 Urge incontinence: Secondary | ICD-10-CM

## 2021-09-26 DIAGNOSIS — Z23 Encounter for immunization: Secondary | ICD-10-CM

## 2021-09-26 DIAGNOSIS — R0989 Other specified symptoms and signs involving the circulatory and respiratory systems: Secondary | ICD-10-CM

## 2021-09-26 DIAGNOSIS — E1151 Type 2 diabetes mellitus with diabetic peripheral angiopathy without gangrene: Secondary | ICD-10-CM | POA: Diagnosis not present

## 2021-09-26 DIAGNOSIS — I6523 Occlusion and stenosis of bilateral carotid arteries: Secondary | ICD-10-CM | POA: Insufficient documentation

## 2021-09-26 DIAGNOSIS — Z794 Long term (current) use of insulin: Secondary | ICD-10-CM

## 2021-09-26 DIAGNOSIS — I5032 Chronic diastolic (congestive) heart failure: Secondary | ICD-10-CM | POA: Diagnosis not present

## 2021-09-26 DIAGNOSIS — N1831 Chronic kidney disease, stage 3a: Secondary | ICD-10-CM | POA: Diagnosis not present

## 2021-09-26 DIAGNOSIS — I739 Peripheral vascular disease, unspecified: Secondary | ICD-10-CM

## 2021-09-26 DIAGNOSIS — I10 Essential (primary) hypertension: Secondary | ICD-10-CM

## 2021-09-26 LAB — POCT GLYCOSYLATED HEMOGLOBIN (HGB A1C): Hemoglobin A1C: 7.7 % — AB (ref 4.0–5.6)

## 2021-09-26 LAB — GLUCOSE, CAPILLARY: Glucose-Capillary: 108 mg/dL — ABNORMAL HIGH (ref 70–99)

## 2021-09-26 NOTE — Assessment & Plan Note (Signed)
On rosuvastatin 20 mg daily.  Repeat lipids today- for secondary prevention (PAD), LDL will need to be less than 70.

## 2021-09-26 NOTE — Assessment & Plan Note (Signed)
Claudication pain is worsening in intensity.  Pain down the backs of legs, eases with rest.  Popliteal and pedal pulses are absent.  Feet of normal color and temp, no lesions.  Given her history and concerning symptoms she will need additional evaluation at [facility].  I also hear L carotid and possible subclavian bruit (softer on the R).  Radial pulses are symmetric and strong.  Continue management of BP, DM, lipids, and treatment with DAPT.  Referral placed.

## 2021-09-26 NOTE — Assessment & Plan Note (Addendum)
BMP today.  In 01/2021, EGFR 54, CR 1.04.  Needs urine protein creatinine ratio which I failed to catch today.

## 2021-09-26 NOTE — Assessment & Plan Note (Signed)
Compensated, asymptomatic.  Monitor.

## 2021-09-26 NOTE — Assessment & Plan Note (Signed)
DEXA scheduled for Fall 2023.  Vit D 34 in 01/2021.

## 2021-09-26 NOTE — Assessment & Plan Note (Signed)
2-3 X nocturia continues despite modification of fluid intake in the hours before bed.  Incontinence is primarily urge, less frequently stress.  She is interested in medication such as Myrbetriq.  We will explore pricing with [place] [facility].

## 2021-09-26 NOTE — Assessment & Plan Note (Signed)
145/53 on olmesartan 10 mg and amlodipine 10 mg.  Will increase olmesartan to 15 mg.  Goal SBP < 130 in this functional lady with symptomatic PAD.

## 2021-09-26 NOTE — Progress Notes (Unsigned)
[name] is here with her partner for routine f/u of chronic conditions.  Her greatest concern is worsening leg pain.  See problem based charting for additional details.  Exam: BP (!) 145/53 (BP Location: Left Arm, Patient Position: Sitting, Cuff Size: Small)   Pulse 70   Temp 97.9 F (36.6 C) (Oral)   Ht 5' 1"  (1.549 m)   Wt 116 lb 4.8 oz (52.8 kg)   SpO2 100%   BMI 21.97 kg/m   Feet are well cared for, sensate to monofilament testing, warm, no lesions.    No popliteal pulses or dp pulses. NO LE edema.  L>R carotid bruit; soft systo murmur Lbase. Lungs clear.  Radials equal and strong.   Assessment and plan:  Peripheral vascular disease (HCC) Claudication pain is worsening in intensity.  Pain down the backs of legs, eases with rest.  Popliteal and pedal pulses are absent.  Feet of normal color and temp, no lesions.  Given her history and concerning symptoms she will need additional evaluation at [facility].  I also hear L carotid and possible subclavian bruit (softer on the R).  Radial pulses are symmetric and strong.  Continue management of BP, DM, lipids, and treatment with DAPT.  Referral placed.  Type 2 diabetes mellitus with stage 3 chronic kidney disease, with long-term current use of insulin (HCC) Tolerating the empagliflozin 25 mg daily and lantus 40 units daily on which her A1c today (46M early) is 7.7.  Tighter control desirable given underlying vascular disease.  She was on  Metformin at one time, stopped when eGFR dropped to around 30; I'll f/u her renal fxn today and suggest restarting if possible.    Osteopenia DEXA scheduled for Fall 2023.  Vit D 34 in 01/2021.    CKD (chronic kidney disease), stage III (HCC) BMP today.  In 01/2021, EGFR 54, CR 1.04.  Needs urine protein creatinine ratio which I failed to catch today.  Hyperlipidemia On rosuvastatin 20 mg daily.  Repeat lipids today- for secondary prevention (PAD), LDL will need to be less than 70.  Urinary  incontinence, urge, with nocturia 2-3 X nocturia continues despite modification of fluid intake in the hours before bed.  Incontinence is primarily urge, less frequently stress.  She is interested in medication such as Myrbetriq.  We will explore pricing with [place] [facility].  Essential hypertension 145/53 on olmesartan 10 mg and amlodipine 10 mg.  Will increase olmesartan to 15 mg.  Goal SBP < 130 in this functional lady with symptomatic PAD.  Flu shot today. Intends to receive new Covid vaccine.

## 2021-09-26 NOTE — Assessment & Plan Note (Addendum)
Tolerating the empagliflozin 25 mg daily and lantus 40 units daily on which her A1c today (27M early) is 7.7.  Tighter control desirable given underlying vascular disease.  She was on  Metformin at one time, stopped when eGFR dropped to around 30; I'll f/u her renal fxn today and suggest restarting if possible.

## 2021-09-27 ENCOUNTER — Encounter: Payer: Self-pay | Admitting: Internal Medicine

## 2021-09-27 LAB — BMP8+ANION GAP
Anion Gap: 18 mmol/L (ref 10.0–18.0)
BUN/Creatinine Ratio: 17 (ref 12–28)
BUN: 18 mg/dL (ref 8–27)
CO2: 25 mmol/L (ref 20–29)
Calcium: 9.6 mg/dL (ref 8.7–10.3)
Chloride: 99 mmol/L (ref 96–106)
Creatinine, Ser: 1.09 mg/dL — ABNORMAL HIGH (ref 0.57–1.00)
Glucose: 103 mg/dL — ABNORMAL HIGH (ref 70–99)
Potassium: 4.6 mmol/L (ref 3.5–5.2)
Sodium: 142 mmol/L (ref 134–144)
eGFR: 50 mL/min/{1.73_m2} — ABNORMAL LOW (ref >59)

## 2021-09-27 LAB — LIPID PANEL
Chol/HDL Ratio: 3.4 ratio (ref 0.0–4.4)
Cholesterol, Total: 181 mg/dL (ref 100–199)
HDL: 53 mg/dL (ref >39)
LDL Chol Calc (NIH): 93 mg/dL (ref 0–99)
Triglycerides: 206 mg/dL — ABNORMAL HIGH (ref 0–149)
VLDL Cholesterol Cal: 35 mg/dL (ref 5–40)

## 2021-09-27 MED ORDER — OLMESARTAN MEDOXOMIL 5 MG PO TABS: 15.0000 mg | ORAL_TABLET | Freq: Every day | ORAL | 3 refills | Status: DC

## 2021-09-27 MED ORDER — MIRABEGRON ER 50 MG PO TB24: 50.0000 mg | ORAL_TABLET | Freq: Every day | ORAL | 3 refills | Status: DC

## 2021-09-27 MED ORDER — METFORMIN HCL 500 MG PO TABS: 500.0000 mg | ORAL_TABLET | Freq: Two times a day (BID) | ORAL | 3 refills | Status: DC

## 2021-09-27 MED ORDER — EMPAGLIFLOZIN 25 MG PO TABS: 25.0000 mg | ORAL_TABLET | Freq: Every day | ORAL | 3 refills | Status: DC

## 2021-10-03 ENCOUNTER — Telehealth: Payer: Self-pay

## 2021-10-03 NOTE — Telephone Encounter (Signed)
Prior Authorization for patient ([name]) came through on cover my meds was submitted with last office notes awaiting approval or denial

## 2021-10-03 NOTE — Telephone Encounter (Signed)
Requesting to speak with a nurse about medication.

## 2021-10-03 NOTE — Telephone Encounter (Signed)
Return pt's call. Stated she did not received AVS from last visit and someone stated they would mail it and she has not received it. I told her I will mail it. Also she has questions about Metformin,which she has been taking 500 mg BID and Lantus 40 units. Stated her BS has been as low as 60's and she drank OJ and felt bad. I asked Debera Lat to talk to pt about her questions - Butch Penny agreed.

## 2021-10-03 NOTE — Telephone Encounter (Signed)
sugar dropping since put on metformin last thursday. She is taking it twice a day and 43 units lantus, this morning it was 60. She agreed to drop her Lantus per Dr. Jimmye Norman to 40 units tonight, then 1 unit each night until her fasting blood sugar is above 80 and preferable around 90 mg/dL. She verbalized understanding that she will stop decreasing her Lantus dose when she has stopped having blood sugar below 80 mg/dL

## 2021-10-10 NOTE — Telephone Encounter (Signed)
Decision: Approved Jaci Lazier Key: [id number] - PA Case ID: [id number] - Rx #: [id number] Need help? Call us at [phone] Outcome Approvedon September 21 Request Reference Number: [id number]. OLMESA MEDOX TAB '5MG'$  is approved through 01/12/2022. Your patient may now fill this prescription and it will be covered. Drug Olmesartan Medoxomil '5MG'$  tablets Form [employer] Medicare Part D Electronic Prior Authorization Form (2017 NCPDP) HCOBTVMT NZDKE UVHA [id number] Provide Exception Process Printed NoticeMaximum Daily Dose of 2

## 2021-10-14 ENCOUNTER — Ambulatory Visit
Admission: RE | Admit: 2021-10-14 | Discharge: 2021-10-14 | Disposition: A | Discharge status: Home / Self Care | Payer: Medicare Other | Source: Ambulatory Visit | Attending: Internal Medicine | Admitting: Internal Medicine

## 2021-10-14 DIAGNOSIS — M810 Age-related osteoporosis without current pathological fracture: Secondary | ICD-10-CM | POA: Diagnosis not present

## 2021-10-14 DIAGNOSIS — Z780 Asymptomatic menopausal state: Secondary | ICD-10-CM | POA: Diagnosis not present

## 2021-10-14 DIAGNOSIS — M85859 Other specified disorders of bone density and structure, unspecified thigh: Secondary | ICD-10-CM

## 2021-10-14 DIAGNOSIS — M8589 Other specified disorders of bone density and structure, multiple sites: Secondary | ICD-10-CM | POA: Diagnosis not present

## 2021-10-18 ENCOUNTER — Other Ambulatory Visit: Payer: Self-pay | Admitting: *Deleted

## 2021-10-18 DIAGNOSIS — I739 Peripheral vascular disease, unspecified: Secondary | ICD-10-CM

## 2021-10-18 DIAGNOSIS — R0989 Other specified symptoms and signs involving the circulatory and respiratory systems: Secondary | ICD-10-CM

## 2021-10-23 ENCOUNTER — Ambulatory Visit (INDEPENDENT_AMBULATORY_CARE_PROVIDER_SITE_OTHER)
Admission: RE | Admit: 2021-10-23 | Discharge: 2021-10-23 | Disposition: A | Discharge status: Home / Self Care | Payer: Medicare Other | Source: Ambulatory Visit | Attending: Vascular Surgery | Admitting: Vascular Surgery

## 2021-10-23 ENCOUNTER — Ambulatory Visit (HOSPITAL_COMMUNITY)
Admission: RE | Admit: 2021-10-23 | Discharge: 2021-10-23 | Disposition: A | Discharge status: Home / Self Care | Payer: Medicare Other | Source: Ambulatory Visit | Attending: Vascular Surgery | Admitting: Vascular Surgery

## 2021-10-23 DIAGNOSIS — R0989 Other specified symptoms and signs involving the circulatory and respiratory systems: Secondary | ICD-10-CM | POA: Diagnosis not present

## 2021-10-23 DIAGNOSIS — I739 Peripheral vascular disease, unspecified: Secondary | ICD-10-CM | POA: Insufficient documentation

## 2021-10-24 ENCOUNTER — Ambulatory Visit (INDEPENDENT_AMBULATORY_CARE_PROVIDER_SITE_OTHER): Payer: Medicare Other | Admitting: Vascular Surgery

## 2021-10-24 ENCOUNTER — Encounter: Payer: Self-pay | Admitting: Vascular Surgery

## 2021-10-24 ENCOUNTER — Ambulatory Visit (HOSPITAL_COMMUNITY)
Admission: RE | Admit: 2021-10-24 | Discharge: 2021-10-24 | Disposition: A | Discharge status: Home / Self Care | Payer: Medicare Other | Source: Ambulatory Visit | Attending: Vascular Surgery | Admitting: Vascular Surgery

## 2021-10-24 VITALS — BP 154/65 | HR 66 | Temp 97.5°F | Resp 20 | Ht 61.0 in | Wt 115.0 lb

## 2021-10-24 DIAGNOSIS — R0989 Other specified symptoms and signs involving the circulatory and respiratory systems: Secondary | ICD-10-CM

## 2021-10-24 DIAGNOSIS — I6523 Occlusion and stenosis of bilateral carotid arteries: Secondary | ICD-10-CM

## 2021-10-24 DIAGNOSIS — I7409 Other arterial embolism and thrombosis of abdominal aorta: Secondary | ICD-10-CM | POA: Diagnosis not present

## 2021-10-24 DIAGNOSIS — I739 Peripheral vascular disease, unspecified: Secondary | ICD-10-CM

## 2021-10-24 NOTE — Progress Notes (Signed)
ASSESSMENT & PLAN   PERIPHERAL ARTERIAL DISEASE: This patient has evidence of multilevel arterial occlusive disease on exam.  I have encouraged her to walk is much as possible to help develop collateral flow.  Fortunately she is not a smoker.  I felt like her symptoms were disabling enough that it would be reasonable to proceed with arteriography and to see if we had any endovascular options to improve her symptoms.  She would like to try conservative measures for now.  Given her age and medical comorbidities I do not think this is unreasonable.  I have ordered follow-up ABIs in 6 months and I will see her back at that time.  She knows to call sooner if her symptoms progress and certainly we can proceed with arteriography.  We have discussed the indications for arteriography and the potential complications.  BILATERAL CAROTID STENOSES: This patient has asymptomatic 40 to 59% carotid stenoses bilaterally.  She understands we would not consider carotid endarterectomy unless the stenosis progressed to greater than 80% or she develop new neurologic symptoms.  I have ordered a follow-up carotid duplex scan in 1 year and we will see her back at that time.  She is on aspirin, Plavix, and a statin.  REASON FOR CONSULT:    Peripheral arterial disease.  The consult is requested by Dr. Dorian Pod.  HPI:   [name] is a 81 y.o. female who I saw back in 2016.  She had presented with progressive ischemia of the left lower extremity and had an absent left femoral pulse.  On 07/03/2014 she underwent angioplasty and stenting of the left common iliac artery for a chronic total occlusion.  When I saw her last in 2016 she was doing well.  She was then lost to follow-up.  On my history today.  The patient describes bilateral hip and thigh claudication at a short distance.  This occurs especially if she is walking uphill.  Her symptoms are more significant on the left side.  I do not get any clear-cut  history of rest pain.  Her activity is also somewhat limited by her dyspnea on exertion.  She denies any history of nonhealing wounds.  Her risk factors for peripheral arterial disease include type 2 diabetes, hypertension, and hypercholesterolemia.  She denies any family history of premature cardiovascular disease.  She does not use tobacco.  She denies any history of stroke, TIAs, expressive or receptive aphasia, or amaurosis fugax.  Past Medical History:  Diagnosis Date   Acute colitis 10/14/2018   Bloody diarrhea 10/13/2018   CAD (coronary artery disease) 2006   s/p Quadrupal CABG 2006   CHF (congestive heart failure) ([place]) 2010   EF 30-35% from Echo 06/2008   CKD (chronic kidney disease) stage 4, GFR 15-29 ml/min (HCC)    fluctuating between stage 3 and 4 depending on GFR   CKD (chronic kidney disease), stage III ([place]) 09/27/2013   De Quervain's tenosynovitis, left 11/13/2015   Diverticulosis    DIVERTICULOSIS OF COLON 04/01/2007   Qualifier: Diagnosis of  By: Harlon Ditty CMA (AAMA), [name]     DM (diabetes mellitus) ([place])    insulin dependent   Dyslipidemia 10/15/2005   Qualifier: Diagnosis of  By: Derrel Nip MD, Helene Kelp     History of colonic polyps    HLD (hyperlipidemia)    HTN (hypertension)    Lumbar spinal stenosis    MI (myocardial infarction) ([place]) 2003   . [place]   Mouth ulcer adjacent to jaw bone  08/22/2010   S/p removal by ENT 09/2010    MYOCARDIAL INFARCTION, HX OF 09/09/2006   Qualifier: Diagnosis of  By: Marinda Elk MD, Sonia Side     Nephrolithiasis    hx of   NEPHROLITHIASIS 04/01/2007   Qualifier: Diagnosis of  By: Harlon Ditty CMA (AAMA), [name]     OA (osteoarthritis)    Osteopenia 2010   T score of -1.8   Pancreatic cysts, - look benign by MR 04/25/2019   Psoriasis    PVD (peripheral vascular disease) (HCC)    ABI indicated moderated reduction arterial flow on the left.   Tricuspid regurgitation 2009   moderate to severe, EF 55%    Family History  Problem  Relation Age of Onset   Cirrhosis Brother    Heart disease Brother    Heart attack Brother    Hypertension Mother    Hyperlipidemia Mother    Hyperlipidemia Son    Hypertension Son    Breast cancer Sister    Breast cancer Daughter     SOCIAL HISTORY: Social History   Tobacco Use   Smoking status: Never   Smokeless tobacco: Never  Substance Use Topics   Alcohol use: No    Alcohol/week: 0.0 standard drinks of alcohol    Allergies  Allergen Reactions   Valsartan Cough    Current Outpatient Medications  Medication Sig Dispense Refill   amLODipine (NORVASC) 10 MG tablet TAKE 1 TABLET(10 MG) BY MOUTH DAILY 90 tablet 3   Apremilast (OTEZLA) 30 MG TABS Take 1 tablet by mouth in the morning and at bedtime.     aspirin EC 81 MG tablet Take 1 tablet (81 mg total) by mouth daily. 90 tablet 3   betamethasone dipropionate 0.05 % lotion Apply 1 Application topically 2 (two) times daily.     Blood Glucose Monitoring Suppl (ACCU-CHEK GUIDE ME) w/Device KIT 1 each by Does not apply route 3 (three) times daily. 1 kit 1   Calcipotriene 0.005 % solution SMARTSIG:1 Milliliter(s) Topical Daily     Calcium Carbonate-Vitamin D (CALCIUM 600+D) 600-400 MG-UNIT tablet Take 1 tablet by mouth daily. 180 tablet 1   clopidogrel (PLAVIX) 75 MG tablet TAKE 1 TABLET(75 MG) BY MOUTH DAILY 90 tablet 3   empagliflozin ([name]) 25 MG TABS tablet Take 1 tablet (25 mg total) by mouth daily before breakfast. 90 tablet 3   glucose blood (ACCU-CHEK GUIDE) test strip Check blood sugar 3 times a day 300 each 3   insulin glargine (LANTUS SOLOSTAR) 100 UNIT/ML Solostar Pen ADMINISTER 40 UNITS UNDER THE SKIN AT BEDTIME 45 mL 4   Insulin Pen Needle (BD PEN NEEDLE NANO 2ND GEN) 32G X 4 MM MISC USE TO INJECT TWICE DAILY AS DIRECTED 200 each 1   Lancets (FREESTYLE) lancets USE TO CHECK BLOOD SUGAR FOUR TIMES DAILY- BEFORE MEALS AND EVERY NIGHT AT BEDTIME 100 each 5   melatonin 5 MG TABS Take 5 mg by mouth at bedtime.      metFORMIN (GLUCOPHAGE) 500 MG tablet Take 1 tablet (500 mg total) by mouth 2 (two) times daily. 180 tablet 3   mirabegron ER (MYRBETRIQ) 50 MG TB24 tablet Take 1 tablet (50 mg total) by mouth daily. 90 tablet 3   nitroGLYCERIN (NITROSTAT) 0.3 MG SL tablet Place 1 tablet (0.3 mg total) under the tongue every 5 (five) minutes as needed for chest pain. 90 tablet 12   olmesartan (BENICAR) 5 MG tablet Take 3 tablets (15 mg total) by mouth daily. 90 tablet 3  pantoprazole (PROTONIX) 20 MG tablet Take 1 tablet (20 mg total) by mouth daily. 90 tablet 3   rosuvastatin (CRESTOR) 20 MG tablet Take 1 tablet (20 mg total) by mouth daily. 90 tablet 3   No current facility-administered medications for this visit.    REVIEW OF SYSTEMS:  _0  denotes positive finding, _1  denotes negative finding Cardiac  Comments:  Chest pain or chest pressure:    Shortness of breath upon exertion: x   Short of breath when lying flat:    Irregular heart rhythm:        Vascular    Pain in calf, thigh, or hip brought on by ambulation: x   Pain in feet at night that wakes you up from your sleep:     Blood clot in your veins:    Leg swelling:         Pulmonary    Oxygen at home:    Productive cough:     Wheezing:         Neurologic    Sudden weakness in arms or legs:     Sudden numbness in arms or legs:     Sudden onset of difficulty speaking or slurred speech:    Temporary loss of vision in one eye:     Problems with dizziness:         Gastrointestinal    Blood in stool:     Vomited blood:         Genitourinary    Burning when urinating:     Blood in urine:        Psychiatric    Major depression:         Hematologic    Bleeding problems:    Problems with blood clotting too easily:        Skin    Rashes or ulcers:        Constitutional    Fever or chills:    -  PHYSICAL EXAM:   Vitals:   10/24/21 1124  BP: (!) 154/65  Pulse: 66  Resp: 20  Temp: (!) 97.5 F (36.4 C)  SpO2: 95%  Weight:  115 lb (52.2 kg)  Height: _2  (1.549 m)   Body mass index is 21.73 kg/m. GENERAL: The patient is a well-nourished female, in no acute distress. The vital signs are documented above. CARDIAC: There is a regular rate and rhythm.  VASCULAR: She has bilateral carotid bruits. On the right side she has a palpable femoral pulse.  I cannot palpate a popliteal or pedal pulses. On the left side I cannot palpate a femoral pulse.  I cannot palpate popliteal or pedal pulses. Both feet have good color and temperature and appear adequately perfused. PULMONARY: There is good air exchange bilaterally without wheezing or rales. ABDOMEN: Soft and non-tender with normal pitched bowel sounds.  I do not palpate any aneurysm. MUSCULOSKELETAL: There are no major deformities. NEUROLOGIC: No focal weakness or paresthesias are detected. SKIN: There are no ulcers or rashes noted. PSYCHIATRIC: The patient has a normal affect.  DATA:    ARTERIAL DOPPLER STUDY: I have been dependently interpreted her arterial Doppler study today.  On the right side there is a monophasic dorsalis pedis and posterior tibial signal.  ABIs 52%.  Toe pressures 45 mmHg.  On the left side, there is a monophasic dorsalis pedis and posterior tibial signal.  ABIs 34%.  Toe pressures 51 mmHg.  AORTIC DUPLEX: I have reviewed the aortic duplex scan that was done  yesterday.  The stent in the left common iliac artery was patent with monophasic flow.  LABS: I reviewed her labs from 09/26/2021.  Her GFR was 50.  Creatinine was 1.1.  CAROTID DUPLEX: I reviewed the patient's carotid duplex scan from yesterday.  On the right side there was a 40 to 59% carotid stenosis.  Right vertebral artery was patent with antegrade flow.  On the left side there was a 40 to 59% carotid stenosis.  The left vertebral artery was patent with antegrade flow.   [name] [facility]

## 2021-10-25 ENCOUNTER — Other Ambulatory Visit: Payer: Self-pay

## 2021-10-25 DIAGNOSIS — I739 Peripheral vascular disease, unspecified: Secondary | ICD-10-CM

## 2021-12-24 ENCOUNTER — Other Ambulatory Visit: Payer: Self-pay

## 2021-12-24 DIAGNOSIS — I739 Peripheral vascular disease, unspecified: Secondary | ICD-10-CM

## 2021-12-24 DIAGNOSIS — K219 Gastro-esophageal reflux disease without esophagitis: Secondary | ICD-10-CM

## 2021-12-24 MED ORDER — CLOPIDOGREL BISULFATE 75 MG PO TABS: ORAL_TABLET | ORAL | 3 refills | Status: DC

## 2021-12-24 MED ORDER — PANTOPRAZOLE SODIUM 20 MG PO TBEC: 20.0000 mg | DELAYED_RELEASE_TABLET | Freq: Every day | ORAL | 3 refills | Status: DC

## 2021-12-25 ENCOUNTER — Other Ambulatory Visit: Payer: Self-pay

## 2021-12-25 DIAGNOSIS — I10 Essential (primary) hypertension: Secondary | ICD-10-CM

## 2021-12-25 MED ORDER — AMLODIPINE BESYLATE 10 MG PO TABS: ORAL_TABLET | ORAL | 3 refills | Status: DC

## 2021-12-26 ENCOUNTER — Other Ambulatory Visit: Payer: Self-pay

## 2021-12-26 DIAGNOSIS — Z951 Presence of aortocoronary bypass graft: Secondary | ICD-10-CM

## 2021-12-26 DIAGNOSIS — E785 Hyperlipidemia, unspecified: Secondary | ICD-10-CM

## 2021-12-26 MED ORDER — ROSUVASTATIN CALCIUM 20 MG PO TABS: 20.0000 mg | ORAL_TABLET | Freq: Every day | ORAL | 3 refills | Status: DC

## 2022-01-22 NOTE — Assessment & Plan Note (Signed)
ABIs Fall 2023 at [facility]: on the right side there is a monophasic dorsalis pedis and posterior tibial signal.  ABIs 52%.  Toe pressures 45 mmHg.   On the left side, there is a monophasic dorsalis pedis and posterior tibial signal.  ABIs 34%.  Toe pressures 51 mmHg.

## 2022-01-22 NOTE — Progress Notes (Signed)
Routine follow-up of HTN, T2DM, CKD 3a, HTN and PAD, last visit 09/2021.  Since that time she has seen Dr. Doren Custard with [facility] vein and vascular to assess her worsening claudication.  Indeed she has bilateral PAD by ABIs and bilateral carotid stenosis, asymptomatic.  Plan was to monitor symptoms, encourage walking, treat with DAPT, and manage cardiovascular risk conditions with 3-monthfollow-up.  All medicines reviewed.  She reports adherence with the exception of Myrbetriq new prescription at last visit, which she does not think she filled.  She did not bring her meds today but does have a handwritten list of what she is taking.  She continues to have URI and nocturia described in problem list.  She is keeping busy, attending crafts/bingo/etc. frequently.  Driving independently which is uncomfortable given her bilateral shoulder pain from arthritis though she manages.  Continues her relationship with her significant other who lives in a separate home.  He has been sick with a upper respiratory infection lately, tested negative for COVID and flu.  Remainder of symptom discussion below under problem list.   BP 129/64 (BP Location: Right Arm, Patient Position: Sitting, Cuff Size: Small)   Pulse 79   Temp 98.2 F (36.8 C) (Oral)   Ht '5\' 1"'$  (1.549 m)   Wt 108 lb 1.6 oz (49 kg)   SpO2 98%   BMI 20.43 kg/m  Slender and short habitus.  Walks without assistive device at a good pace, balanced gait.  Clear mentation as baseline.  No reported foot wounds.  Assessment  and Plan: (problems in no particular order)  Peripheral vascular disease ([facility] "on the right side there is a monophasic dorsalis pedis and posterior tibial signal.  ABIs 52%.  Toe pressures 45 mmHg.  On the left side, there is a monophasic dorsalis pedis and posterior tibial signal.  ABIs 34%.  Toe pressures 51 mmHg."   Continues to experience claudication with walking which limits distance.  As instructed she  walks each day, at least to her mailbox and back, and about the house.  She is experiencing no rest pain.  No foot or toe wounds.  Continue DAPT and management of her risk factors.  Essential hypertension BP controlled on olmesartan 15 mg daily ( 3 x 5 mg) and amlodipine 10 mg daily.  Will change to combo pill to reduce pill burden.  Begin amlodipine 5-olmesartan 20 mg daily once her current meds run out.    Chronic heart failure with preserved ejection fraction (HCC) Compensated, asymptomatic.  No LE edema or JVD.  Type 2 diabetes mellitus with stage 3 chronic kidney disease, with long-term current use of insulin (HCC) Restarted metformin 500 mg bid last visit, A1c decreased from 7.7 to 7.3.  She has reduced her lantus on her own to 20 units from 40 units; fasting glucose ave 80-120s, no lows.  Rechecking renal fxn today but plan is to increase to 1000 mg bid.  She will notify [place] has intolerance.  Her goal is to come off insulin.  Age-related osteoporosis without current pathological fracture DXA identified osteoporosis, worsened from osteopenia.  She has had no falls and no fractures.  On CA/D supp.  Check vit D to ensure repletion.  Discussed bisphosphonate therapy and she is agreeable to treatment.  Begin Fosamax.    Psoriasis Experiencing exacerbation on buttock fold, coccyx area.  Using her topical steroid.  Dermatologist set a 1 year f/u and I advised her to schedule a  visit earlier to discuss her flare.  Continues on [place].  Localized, secondary osteoarthritis of the shoulder region "It's very painful, I have to be careful because I can't move them very well, but I have to live with it".  She remains adamant about decision to avoid surgery.  I agree with her decision.  Stage 3a chronic kidney disease (CKD) (HCC) eGFR 09/26/2021 = 50, CR 1.09 and BUN 18.  Repeat today.  UAC ratio ordered today.  Urinary incontinence, urge, with nocturia Ms. Dafoe does not think that she picked  up the Myrbetriq.  She continues to experience urge incontinence and nocturia, very similar to her status 4 months ago.  I advised her to check on her prescription which she will do.  Hyperlipidemia 09/2021: TC 182, LDL 93, TG 206.  Today increased Crestor from 20 to 40 mg daily for goal LDL less than 70 (secondary prevention for PAD, CAD).  Chronic insomnia Not discussed today but she does endorse adherence to melatonin nightly.  Hx of oral cancer, ruled out 2018 Chart review reveals no history of oral cancer.  In 2018 iand 2019 she was seeing ENT Dr. Constance Holster for some oral lesions associated with poorly fitting long-term dentures.  A scan was done of the area and Dr. Janeice Robinson interpretation was that there were no concerning findings.  She has not had a follow-up with him since 2019, as apparently there was no reason to do so.  Will resolve this problem.  Unintended weight loss Weight 115 pounds in 10/2021, 108 now.  She reports a good appetite and has been eating well.  Others have noticed.  We had previously talked about starting a GLP-1 agonist which we will not do at this time, as further weight loss would not be healthy at her current BMI of 20.  Will follow trend over the next 3 months.  Healthcare maintenance Due for interval Tdap/Td and Shingrix.  Did not discuss today, I will catch her next time. Marland Kitchen

## 2022-01-23 ENCOUNTER — Ambulatory Visit (INDEPENDENT_AMBULATORY_CARE_PROVIDER_SITE_OTHER): Payer: Medicare Other | Admitting: Internal Medicine

## 2022-01-23 VITALS — BP 129/64 | HR 79 | Temp 98.2°F | Ht 61.0 in | Wt 108.1 lb

## 2022-01-23 DIAGNOSIS — I130 Hypertensive heart and chronic kidney disease with heart failure and stage 1 through stage 4 chronic kidney disease, or unspecified chronic kidney disease: Secondary | ICD-10-CM

## 2022-01-23 DIAGNOSIS — E1151 Type 2 diabetes mellitus with diabetic peripheral angiopathy without gangrene: Secondary | ICD-10-CM | POA: Diagnosis not present

## 2022-01-23 DIAGNOSIS — I739 Peripheral vascular disease, unspecified: Secondary | ICD-10-CM

## 2022-01-23 DIAGNOSIS — C049 Malignant neoplasm of floor of mouth, unspecified: Secondary | ICD-10-CM | POA: Diagnosis not present

## 2022-01-23 DIAGNOSIS — Z0000 Encounter for general adult medical examination without abnormal findings: Secondary | ICD-10-CM

## 2022-01-23 DIAGNOSIS — Z951 Presence of aortocoronary bypass graft: Secondary | ICD-10-CM

## 2022-01-23 DIAGNOSIS — I5032 Chronic diastolic (congestive) heart failure: Secondary | ICD-10-CM | POA: Diagnosis not present

## 2022-01-23 DIAGNOSIS — N3941 Urge incontinence: Secondary | ICD-10-CM

## 2022-01-23 DIAGNOSIS — I10 Essential (primary) hypertension: Secondary | ICD-10-CM

## 2022-01-23 DIAGNOSIS — E785 Hyperlipidemia, unspecified: Secondary | ICD-10-CM

## 2022-01-23 DIAGNOSIS — E1122 Type 2 diabetes mellitus with diabetic chronic kidney disease: Secondary | ICD-10-CM | POA: Diagnosis not present

## 2022-01-23 DIAGNOSIS — M19219 Secondary osteoarthritis, unspecified shoulder: Secondary | ICD-10-CM

## 2022-01-23 DIAGNOSIS — N1832 Chronic kidney disease, stage 3b: Secondary | ICD-10-CM

## 2022-01-23 DIAGNOSIS — M810 Age-related osteoporosis without current pathological fracture: Secondary | ICD-10-CM

## 2022-01-23 DIAGNOSIS — L409 Psoriasis, unspecified: Secondary | ICD-10-CM | POA: Diagnosis not present

## 2022-01-23 DIAGNOSIS — Z794 Long term (current) use of insulin: Secondary | ICD-10-CM | POA: Diagnosis not present

## 2022-01-23 DIAGNOSIS — N1831 Chronic kidney disease, stage 3a: Secondary | ICD-10-CM | POA: Diagnosis not present

## 2022-01-23 DIAGNOSIS — R634 Abnormal weight loss: Secondary | ICD-10-CM

## 2022-01-23 DIAGNOSIS — E7841 Elevated Lipoprotein(a): Secondary | ICD-10-CM

## 2022-01-23 DIAGNOSIS — F5104 Psychophysiologic insomnia: Secondary | ICD-10-CM

## 2022-01-23 LAB — POCT GLYCOSYLATED HEMOGLOBIN (HGB A1C): Hemoglobin A1C: 7.3 % — AB (ref 4.0–5.6)

## 2022-01-23 LAB — GLUCOSE, CAPILLARY: Glucose-Capillary: 192 mg/dL — ABNORMAL HIGH (ref 70–99)

## 2022-01-23 MED ORDER — METFORMIN HCL 500 MG PO TABS: 1000.0000 mg | ORAL_TABLET | Freq: Two times a day (BID) | ORAL | 3 refills | Status: DC

## 2022-01-23 MED ORDER — ROSUVASTATIN CALCIUM 40 MG PO TABS: 40.0000 mg | ORAL_TABLET | Freq: Every day | ORAL | 3 refills | Status: DC

## 2022-01-23 MED ORDER — ALENDRONATE SODIUM 70 MG PO TABS: 70.0000 mg | ORAL_TABLET | ORAL | 3 refills | Status: DC

## 2022-01-23 NOTE — Assessment & Plan Note (Signed)
Weight 115 pounds in 10/2021, 108 now.  She reports a good appetite and has been eating well.  Others have noticed.  We had previously talked about starting a GLP-1 agonist which we will not do at this time, as further weight loss would not be healthy at her current BMI of 20.  Will follow trend over the next 3 months.

## 2022-01-23 NOTE — Assessment & Plan Note (Signed)
BP controlled on olmesartan 15 mg daily ( 3 x 5 mg) and amlodipine 10 mg daily.  Will change to combo pill to reduce pill burden.  Begin amlodipine 5-olmesartan 20 mg daily once her current meds run out.

## 2022-01-23 NOTE — Assessment & Plan Note (Addendum)
[name] does not think that she picked up the Myrbetriq.  She continues to experience urge incontinence and nocturia, very similar to her status 4 months ago.  I advised her to check on her prescription which she will do.

## 2022-01-23 NOTE — Assessment & Plan Note (Signed)
Experiencing exacerbation on buttock fold, coccyx area.  Using her topical steroid.  Dermatologist set a 1 year f/u and I advised her to schedule a visit earlier to discuss her flare.  Continues on [place].

## 2022-01-23 NOTE — Assessment & Plan Note (Signed)
eGFR 09/26/2021 equals 50, CR 1.09 and BUN 18.  Repeat today.  UAC ratio ordered today.

## 2022-01-23 NOTE — Assessment & Plan Note (Signed)
Due for interval Tdap/Td and Shingrix.  Did not discuss today, I will catch her next time.

## 2022-01-23 NOTE — Assessment & Plan Note (Signed)
"  It's very painful, I have to be careful because I can't move them very well, but I have to live with it".  She remains adamant about decision to avoid surgery.  I agree with her decision.

## 2022-01-23 NOTE — Assessment & Plan Note (Signed)
Chart review reveals no history of oral cancer.  In 2018 iand 2019 she was seeing ENT Dr. Constance Holster for some oral lesions associated with poorly fitting long-term dentures.  A scan was done of the area and Dr. Janeice Robinson interpretation was that there were no concerning findings.  She has not had a follow-up with him since 2019, as apparently there was no reason to do so.  Will resolve this problem.

## 2022-01-23 NOTE — Assessment & Plan Note (Signed)
Not discussed today but she does endorse adherence to melatonin nightly.

## 2022-01-23 NOTE — Assessment & Plan Note (Addendum)
Restarted metformin 500 mg bid last visit, A1c decreased from 7.7 to 7.3.  She has reduced her lantus on her own to 20 units from 40 units; fasting glucose ave 80-120s, no lows.  Rechecking renal fxn today but plan is to increase to 1000 mg bid.  She will notify us if she has intolerance.  Her goal is to come off insulin.

## 2022-01-23 NOTE — Assessment & Plan Note (Signed)
Compensated, asymptomatic.  No LE edema or JVD.

## 2022-01-23 NOTE — Assessment & Plan Note (Signed)
DXA identified osteoporosis, worsened from osteopenia.  She has had no falls and no fractures.  On CA/D supp.  Check vit D to ensure repletion.  Discussed bisphosphonate therapy and she is agreeable to treatment.  Begin Fosamax.

## 2022-01-23 NOTE — Assessment & Plan Note (Signed)
09/2021: TC 182, LDL 93, TG 206.  Today increase Crestor from 20 to 40 mg daily for goal LDL less than 70 (secondary prevention for PAD, CAD).

## 2022-01-24 ENCOUNTER — Encounter: Payer: Self-pay | Admitting: Internal Medicine

## 2022-01-24 LAB — CBC
Hematocrit: 36.3 % (ref 34.0–46.6)
Hemoglobin: 12.1 g/dL (ref 11.1–15.9)
MCH: 28.9 pg (ref 26.6–33.0)
MCHC: 33.3 g/dL (ref 31.5–35.7)
MCV: 87 fL (ref 79–97)
Platelets: 209 10*3/uL (ref 150–450)
RBC: 4.18 x10E6/uL (ref 3.77–5.28)
RDW: 12.6 % (ref 11.7–15.4)
WBC: 5.9 10*3/uL (ref 3.4–10.8)

## 2022-01-24 LAB — MICROALBUMIN / CREATININE URINE RATIO
Creatinine, Urine: 43.1 mg/dL
Microalb/Creat Ratio: 188 mg/g creat — ABNORMAL HIGH (ref 0–29)
Microalbumin, Urine: 81 ug/mL

## 2022-01-24 LAB — BMP8+ANION GAP
Anion Gap: 17 mmol/L (ref 10.0–18.0)
BUN/Creatinine Ratio: 14 (ref 12–28)
BUN: 17 mg/dL (ref 8–27)
CO2: 22 mmol/L (ref 20–29)
Calcium: 9.5 mg/dL (ref 8.7–10.3)
Chloride: 101 mmol/L (ref 96–106)
Creatinine, Ser: 1.21 mg/dL — ABNORMAL HIGH (ref 0.57–1.00)
Glucose: 206 mg/dL — ABNORMAL HIGH (ref 70–99)
Potassium: 4.1 mmol/L (ref 3.5–5.2)
Sodium: 140 mmol/L (ref 134–144)
eGFR: 44 mL/min/{1.73_m2} — ABNORMAL LOW (ref >59)

## 2022-01-24 LAB — VITAMIN D 25 HYDROXY (VIT D DEFICIENCY, FRACTURES): Vit D, 25-Hydroxy: 38.1 ng/mL (ref 30.0–100.0)

## 2022-01-24 MED ORDER — LANTUS SOLOSTAR 100 UNIT/ML ~~LOC~~ SOPN: PEN_INJECTOR | SUBCUTANEOUS | 4 refills | Status: DC

## 2022-04-07 ENCOUNTER — Telehealth: Payer: Self-pay

## 2022-04-07 NOTE — Telephone Encounter (Signed)
Patient called she is requesting a medication refill for omlesartan, Is the patient supposed to be taking omlesartan?

## 2022-04-08 ENCOUNTER — Other Ambulatory Visit: Payer: Self-pay | Admitting: Internal Medicine

## 2022-04-08 DIAGNOSIS — I10 Essential (primary) hypertension: Secondary | ICD-10-CM

## 2022-04-08 MED ORDER — AMLODIPINE-OLMESARTAN 5-20 MG PO TABS: 1.0000 | ORAL_TABLET | Freq: Every day | ORAL | 3 refills | Status: DC

## 2022-04-08 NOTE — Telephone Encounter (Signed)
Patient would like to use  [name], [name] Phone: [phone]  Fax: [phone]

## 2022-04-15 ENCOUNTER — Other Ambulatory Visit: Payer: Self-pay | Admitting: Internal Medicine

## 2022-04-15 DIAGNOSIS — Z1231 Encounter for screening mammogram for malignant neoplasm of breast: Secondary | ICD-10-CM

## 2022-04-17 NOTE — Telephone Encounter (Signed)
Pt called in regards the new  med and not taking it for a few days because she was scared one what to do .Marland Kitchen Because  she has Amlodipine already .Marland KitchenMarland KitchenMarland KitchenSo I read her the message from PCP  that  states once her Olmesartan had ran out she was to start her combo med ... So she is in agreement to go ahead and take her new med  alone . And discard  the amlodipine

## 2022-05-01 ENCOUNTER — Ambulatory Visit (INDEPENDENT_AMBULATORY_CARE_PROVIDER_SITE_OTHER): Payer: 59 | Admitting: Internal Medicine

## 2022-05-01 VITALS — BP 153/48 | HR 70 | Temp 97.7°F | Ht 61.0 in | Wt 110.3 lb

## 2022-05-01 DIAGNOSIS — M810 Age-related osteoporosis without current pathological fracture: Secondary | ICD-10-CM | POA: Diagnosis not present

## 2022-05-01 DIAGNOSIS — E1122 Type 2 diabetes mellitus with diabetic chronic kidney disease: Secondary | ICD-10-CM

## 2022-05-01 DIAGNOSIS — I5032 Chronic diastolic (congestive) heart failure: Secondary | ICD-10-CM | POA: Diagnosis not present

## 2022-05-01 DIAGNOSIS — K219 Gastro-esophageal reflux disease without esophagitis: Secondary | ICD-10-CM

## 2022-05-01 DIAGNOSIS — E7841 Elevated Lipoprotein(a): Secondary | ICD-10-CM

## 2022-05-01 DIAGNOSIS — L409 Psoriasis, unspecified: Secondary | ICD-10-CM

## 2022-05-01 DIAGNOSIS — Z951 Presence of aortocoronary bypass graft: Secondary | ICD-10-CM

## 2022-05-01 DIAGNOSIS — I10 Essential (primary) hypertension: Secondary | ICD-10-CM

## 2022-05-01 DIAGNOSIS — E1151 Type 2 diabetes mellitus with diabetic peripheral angiopathy without gangrene: Secondary | ICD-10-CM

## 2022-05-01 DIAGNOSIS — Z794 Long term (current) use of insulin: Secondary | ICD-10-CM

## 2022-05-01 DIAGNOSIS — I739 Peripheral vascular disease, unspecified: Secondary | ICD-10-CM

## 2022-05-01 DIAGNOSIS — Z7984 Long term (current) use of oral hypoglycemic drugs: Secondary | ICD-10-CM

## 2022-05-01 DIAGNOSIS — N1832 Chronic kidney disease, stage 3b: Secondary | ICD-10-CM | POA: Diagnosis not present

## 2022-05-01 DIAGNOSIS — I130 Hypertensive heart and chronic kidney disease with heart failure and stage 1 through stage 4 chronic kidney disease, or unspecified chronic kidney disease: Secondary | ICD-10-CM | POA: Diagnosis not present

## 2022-05-01 DIAGNOSIS — N3941 Urge incontinence: Secondary | ICD-10-CM

## 2022-05-01 LAB — GLUCOSE, CAPILLARY: Glucose-Capillary: 202 mg/dL — ABNORMAL HIGH (ref 70–99)

## 2022-05-01 LAB — POCT GLYCOSYLATED HEMOGLOBIN (HGB A1C): Hemoglobin A1C: 7 % — AB (ref 4.0–5.6)

## 2022-05-01 MED ORDER — ACCU-CHEK GUIDE VI STRP: ORAL_STRIP | 3 refills | Status: DC

## 2022-05-01 MED ORDER — BD PEN NEEDLE NANO 2ND GEN 32G X 4 MM MISC: 1 refills | Status: DC

## 2022-05-01 MED ORDER — NITROGLYCERIN 0.3 MG SL SUBL: 0.3000 mg | SUBLINGUAL_TABLET | SUBLINGUAL | 1 refills | Status: DC | PRN

## 2022-05-01 MED ORDER — AMLODIPINE-OLMESARTAN 10-20 MG PO TABS: 1.0000 | ORAL_TABLET | Freq: Every day | ORAL | 3 refills | Status: DC

## 2022-05-01 MED ORDER — EMPAGLIFLOZIN 25 MG PO TABS: 25.0000 mg | ORAL_TABLET | Freq: Every day | ORAL | 3 refills | Status: DC

## 2022-05-01 MED ORDER — LANTUS SOLOSTAR 100 UNIT/ML ~~LOC~~ SOPN: PEN_INJECTOR | SUBCUTANEOUS | 4 refills | Status: DC

## 2022-05-01 NOTE — Assessment & Plan Note (Signed)
[name] is unsure if the Myrbetriq is helping.  Minimal incontinence as long as she can get to the bathroom in time.  Nocturia is bothersome but she is drinking fluids late in the day which will first be addressed.  She will try going without the Myrbetriq to determine if symptoms worsen.

## 2022-05-01 NOTE — Assessment & Plan Note (Signed)
GFR 01/2022 equals 44, CR 1.21.  UAC ratio then was 188, decreased from 227 in 2021.  Still room for improvement, continue empagliflozin and tight DM control.

## 2022-05-01 NOTE — Progress Notes (Signed)
Routine follow-up of HTN, T2DM, CKD 3a, HTN and PAD, last visit 01/2022.  Since last visit she and her partner drove to [facility] (she drives) to visit family.  She continues to have RLE pain which is limiting, as well as bilateral claudication during walking - she cannot walk the hospital hall en route to the clinic   Plan per vascular has been monitoring symptoms, walking, DAPT, and management of cardiovascular risk conditions with f/u scheduled with [name] 05/2022.   All medicines reviewed.  She reports adherence with all. She did not bring her meds today but does have a handwritten list of what she is taking.       Objective  BP (!) 153/48 (BP Location: Left Arm, Patient Position: Sitting, Cuff Size: Small)   Pulse 70   Temp 97.7 F (36.5 C) (Oral)   Ht  (1.549 m)   Wt 110 lb 4.8 oz (50 kg)   SpO2 99%   BMI 20.84 kg/m  She did take all of her medicines this am. [name] is sharply dressed and groomed with youthful appearance. Feet are chronically pulseless due to known PAD.  Warm, pink, no wounds.  Distal sensation intact to gross touch and monofilament testing.  Vibration sense intact.  Toenails due for trim.  Popliteal pulses 1+ each leg.  Legs without edema, slender, symmetric.  Assessment and plan   GERD (gastroesophageal reflux disease) Symptoms usually triggered by food choices; will change to OTCs free through [place] and stop the PPI for trial  Type 2 diabetes mellitus with stage 3 chronic kidney disease, with long-term current use of insulin (HCC) Excellent DM control, readings in target range, A1c 7!!!!  Continue metformin 1000 mg twice daily, empagliflozin 25 mg daily, and Lantus 20 units daily.  Refilled test strips and needles today. [name] eye appt 06/2022  Essential hypertension 153/48, HR 70, on medications.  Increase combo pill to olmesartan 20 mg and amlodipine to 10 mg.  She has a blood pressure cuff at home but needs instructions on how to use it, and will  bring next time.  Goal SBP less than 130 if tolerated.  Peripheral vascular disease (HCC) Claudication continues, relieved by rest.  DAPT.  Pulseless feet, popliteals 1+ each leg.  Vascular follow-up appointment May 2024.  Chronic heart failure with preserved ejection fraction (HCC) Compensated, asymptomatic.  No LE edema or JVD.  Monitor.  Age-related osteoporosis without current pathological fracture Continues vitamin D and calcium supplementation and is tolerating alendronate, begun 01/2022.  No falls, no fractures.  Stable.  Stage 3b chronic kidney disease (CKD) (HCC) GFR 01/2022 equals 44, CR 1.21.  UAC ratio then was 188, decreased from 227 in 2021.  Still room for improvement, continue empagliflozin and tight DM control.   Urinary urgency, infrequent incontinence,, with nocturia [name] is unsure if the Myrbetriq is helping.  Minimal incontinence as long as she can get to the bathroom in time.  Nocturia is bothersome but she is drinking fluids late in the day which will first be addressed.  She will try going without the Myrbetriq to determine if symptoms worsen.  Hyperlipidemia LDL 93 in 9//2023.  Continue rosuvastatin.

## 2022-05-01 NOTE — Assessment & Plan Note (Signed)
Compensated, asymptomatic.  No LE edema or JVD.  Monitor.

## 2022-05-01 NOTE — Assessment & Plan Note (Signed)
153/48, HR 70, on medications.  Increase combo pill to olmesartan 20 mg and amlodipine to 10 mg.  She has a blood pressure cuff at home but needs instructions on how to use it, and will bring next time.  Goal SBP less than 130 if tolerated.

## 2022-05-01 NOTE — Assessment & Plan Note (Addendum)
Excellent DM control, readings in target range, A1c 7!!!!  Continue metformin 1000 mg twice daily, empagliflozin 25 mg daily, and Lantus 20 units daily.  Refilled test strips and needles today. Dr. Dione Booze eye appt 06/2022

## 2022-05-01 NOTE — Assessment & Plan Note (Signed)
Continues vitamin D and calcium supplementation and is tolerating alendronate, begun 01/2022.  No falls, no fractures.  Stable.

## 2022-05-01 NOTE — Patient Instructions (Signed)
[name], [name] to see you today as always!  Today we reviewed your medicines and I will increase your dose of blood pressure medicine to get better control, which is so important in caring for your peripheral artery disease.  It is the same medicine, but the amlodipine component will be 10 mg instead of 5 mg.  Your diabetes control is excellent!  Keep up the good work!  Take care and stay well, Dr. Mayford Knife

## 2022-05-01 NOTE — Assessment & Plan Note (Signed)
LDL 93 in 9//2023.  Continue rosuvastatin.

## 2022-05-01 NOTE — Assessment & Plan Note (Signed)
Symptoms usually triggered by food choices; will change to OTCs free through [place] and stop the PPI for trial

## 2022-05-01 NOTE — Assessment & Plan Note (Signed)
Claudication continues, relieved by rest.  DAPT.  Pulseless feet, popliteals 1+ each leg.  Vascular follow-up appointment May 2024.

## 2022-05-28 ENCOUNTER — Ambulatory Visit
Admission: RE | Admit: 2022-05-28 | Discharge: 2022-05-28 | Disposition: A | Discharge status: Home / Self Care | Payer: 59 | Source: Ambulatory Visit | Attending: Internal Medicine | Admitting: Internal Medicine

## 2022-05-28 DIAGNOSIS — Z1231 Encounter for screening mammogram for malignant neoplasm of breast: Secondary | ICD-10-CM | POA: Diagnosis not present

## 2022-05-29 ENCOUNTER — Ambulatory Visit (INDEPENDENT_AMBULATORY_CARE_PROVIDER_SITE_OTHER): Payer: 59 | Admitting: Vascular Surgery

## 2022-05-29 ENCOUNTER — Ambulatory Visit (HOSPITAL_COMMUNITY)
Admission: RE | Admit: 2022-05-29 | Discharge: 2022-05-29 | Disposition: A | Discharge status: Home / Self Care | Payer: 59 | Source: Ambulatory Visit | Attending: Vascular Surgery | Admitting: Vascular Surgery

## 2022-05-29 ENCOUNTER — Encounter: Payer: Self-pay | Admitting: Vascular Surgery

## 2022-05-29 VITALS — BP 171/70 | HR 66 | Temp 97.8°F | Resp 20 | Ht 61.0 in | Wt 106.0 lb

## 2022-05-29 DIAGNOSIS — I70219 Atherosclerosis of native arteries of extremities with intermittent claudication, unspecified extremity: Secondary | ICD-10-CM

## 2022-05-29 DIAGNOSIS — I7409 Other arterial embolism and thrombosis of abdominal aorta: Secondary | ICD-10-CM | POA: Diagnosis not present

## 2022-05-29 DIAGNOSIS — K2970 Gastritis, unspecified, without bleeding: Secondary | ICD-10-CM | POA: Diagnosis not present

## 2022-05-29 DIAGNOSIS — I739 Peripheral vascular disease, unspecified: Secondary | ICD-10-CM | POA: Diagnosis not present

## 2022-05-29 LAB — VAS US ABI WITH/WO TBI
Left ABI: 0.33
Right ABI: 0.49

## 2022-05-29 NOTE — Progress Notes (Signed)
REASON FOR VISIT:   Follow-up of peripheral arterial disease with claudication  MEDICAL ISSUES:   PERIPHERAL ARTERIAL DISEASE: Despite fairly significantly reduced ABIs the patient has stable claudication of both lower extremities.  I do not get any history of rest pain.  She is 82.  Given that she is having some pain in her heels at night which could potentially be early rest pain we did discuss arteriography.  She would like to hold off on that which is perfectly reasonable.  She will notify us if her symptoms progress significantly or she develops any ulcerations.  Fortunately she is not a smoker.  She is on aspirin and a statin.  I have ordered follow-up ABIs when she returns in October.  Will also check her left external iliac artery stent which was patent in October of last year.  BILATERAL CAROTID STENOSES: The patient has asymptomatic bilateral 40 to 59% carotid stenoses.  She is due for a follow-up duplex scan in October of this year.  We have coordinated her follow-up of her peripheral arterial disease at that time.  I have explained that I will be retiring and she would like to be seen by a physician at that time.  HPI:   [name] is a pleasant 82 y.o. female who had a left common iliac artery stent placed in June 2016.  I have been following her with multilevel arterial occlusive disease.  I last saw her 6 months ago on 10/24/2021.  At that time she was having significant claudication and we discussed considering arteriography however she elected to continue with conservative measures.  Fortunately she is not a smoker.  She comes in for 81-month follow-up visit.  Since I saw her last she continues to have claudication in both calves which is brought on by ambulation and relieved with rest.  She gets some claudication in her thighs also.  I do not get any history of rest pain although she tells me that sometimes her heels hurt at night.  She denies any history of  nonhealing ulcers.  She is not a smoker.  She is on aspirin, Plavix, and a statin.  In addition I have been following her with bilateral carotid disease.  When I saw her last in October 2023 she had bilateral 40 to 59% stenoses.  She is due for a follow-up duplex scan in October of this year.  She is asymptomatic.  She denies any history of stroke, TIAs, expressive or receptive aphasia, or amaurosis fugax.  Past Medical History:  Diagnosis Date   Acute colitis 10/14/2018   Bloody diarrhea 10/13/2018   CAD (coronary artery disease) 2006   s/p Quadrupal CABG 2006   CHF (congestive heart failure) (HCC) 2010   EF 30-35% from Echo 06/2008   CKD (chronic kidney disease) stage 4, GFR 15-29 ml/min (HCC)    fluctuating between stage 3 and 4 depending on GFR   CKD (chronic kidney disease), stage III (HCC) 09/27/2013   De Quervain's tenosynovitis, left 11/13/2015   Diverticulosis    DIVERTICULOSIS OF COLON 04/01/2007   Qualifier: Diagnosis of  By: Candice Camp CMA (AAMA), [name]     DM (diabetes mellitus) (HCC)    insulin dependent   Dyslipidemia 10/15/2005   Qualifier: Diagnosis of  By: Darrick Huntsman MD, Rosey Bath     History of colonic polyps    HLD (hyperlipidemia)    HTN (hypertension)    Lumbar spinal stenosis    MI (myocardial infarction) (HCC) 2003   .  [place]   Mouth ulcer adjacent to jaw bone 08/22/2010   S/p removal by ENT 09/2010    MYOCARDIAL INFARCTION, HX OF 09/09/2006   Qualifier: Diagnosis of  By: Meredith Pel MD, Dorene Sorrow     Nephrolithiasis    hx of   NEPHROLITHIASIS 04/01/2007   Qualifier: Diagnosis of  By: Candice Camp CMA (AAMA), [name]     OA (osteoarthritis)    Osteopenia 2010   T score of -1.8   Pancreatic cysts, - look benign by MR 04/25/2019   Psoriasis    PVD (peripheral vascular disease) (HCC)    ABI indicated moderated reduction arterial flow on the left.   Tricuspid regurgitation 2009   moderate to severe, EF 55%    Family History  Problem Relation Age of Onset    Cirrhosis Brother    Heart disease Brother    Heart attack Brother    Hypertension Mother    Hyperlipidemia Mother    Hyperlipidemia Son    Hypertension Son    Breast cancer Sister    Breast cancer Daughter     SOCIAL HISTORY: Social History   Tobacco Use   Smoking status: Never   Smokeless tobacco: Never  Substance Use Topics   Alcohol use: No    Alcohol/week: 0.0 standard drinks of alcohol    Allergies  Allergen Reactions   Valsartan Cough    Current Outpatient Medications  Medication Sig Dispense Refill   alendronate (FOSAMAX) 70 MG tablet Take 1 tablet (70 mg total) by mouth every 7 (seven) days. Take with a full glass of water on an empty stomach. For bone health. 12 tablet 3   amlodipine-olmesartan (AZOR) 10-20 MG tablet Take 1 tablet by mouth daily. 90 tablet 3   Apremilast (OTEZLA) 30 MG TABS Take 1 tablet by mouth in the morning and at bedtime.     aspirin EC 81 MG tablet Take 1 tablet (81 mg total) by mouth daily. 90 tablet 3   augmented betamethasone dipropionate (DIPROLENE-AF) 0.05 % cream Apply topically 2 (two) times daily.     Blood Glucose Monitoring Suppl (ACCU-CHEK GUIDE ME) w/Device KIT 1 each by Does not apply route 3 (three) times daily. 1 kit 1   Calcipotriene 0.005 % solution SMARTSIG:1 Milliliter(s) Topical Daily     Calcium Carbonate-Vitamin D (CALCIUM 600+D) 600-400 MG-UNIT tablet Take 1 tablet by mouth daily. 180 tablet 1   clopidogrel (PLAVIX) 75 MG tablet TAKE 1 TABLET(75 MG) BY MOUTH DAILY 90 tablet 3   empagliflozin ([name]) 25 MG TABS tablet Take 1 tablet (25 mg total) by mouth daily before breakfast. 90 tablet 3   glucose blood (ACCU-CHEK GUIDE) test strip Check blood sugar every morning fasting 300 each 3   insulin glargine (LANTUS SOLOSTAR) 100 UNIT/ML Solostar Pen ADMINISTER 20 UNITS UNDER THE SKIN AT BEDTIME 45 mL 4   Insulin Pen Needle (BD PEN NEEDLE NANO 2ND GEN) 32G X 4 MM MISC Use for injection as prescribed 200 each 1   Lancets  (FREESTYLE) lancets USE TO CHECK BLOOD SUGAR FOUR TIMES DAILY- BEFORE MEALS AND EVERY NIGHT AT BEDTIME 100 each 5   metFORMIN (GLUCOPHAGE) 500 MG tablet Take 2 tablets (1,000 mg total) by mouth 2 (two) times daily with a meal. 180 tablet 3   mirabegron ER (MYRBETRIQ) 50 MG TB24 tablet Take 1 tablet (50 mg total) by mouth daily. 90 tablet 3   nitroGLYCERIN (NITROSTAT) 0.3 MG SL tablet Place 1 tablet (0.3 mg total) under the tongue every 5 (five)  minutes as needed for chest pain. 30 tablet 1   rosuvastatin (CRESTOR) 40 MG tablet Take 1 tablet (40 mg total) by mouth daily. 90 tablet 3   No current facility-administered medications for this visit.    REVIEW OF SYSTEMS:  [X]  denotes positive finding, [ ]  denotes negative finding Cardiac  Comments:  Chest pain or chest pressure:    Shortness of breath upon exertion:    Short of breath when lying flat:    Irregular heart rhythm:        Vascular    Pain in calf, thigh, or hip brought on by ambulation: x   Pain in feet at night that wakes you up from your sleep:  x   Blood clot in your veins:    Leg swelling:         Pulmonary    Oxygen at home:    Productive cough:     Wheezing:         Neurologic    Sudden weakness in arms or legs:     Sudden numbness in arms or legs:     Sudden onset of difficulty speaking or slurred speech:    Temporary loss of vision in one eye:     Problems with dizziness:         Gastrointestinal    Blood in stool:     Vomited blood:         Genitourinary    Burning when urinating:     Blood in urine:        Psychiatric    Major depression:         Hematologic    Bleeding problems:    Problems with blood clotting too easily:        Skin    Rashes or ulcers:        Constitutional    Fever or chills:     PHYSICAL EXAM:   Vitals:   05/29/22 1237  BP: (!) 171/70  Pulse: 66  Resp: 20  Temp: 97.8 F (36.6 C)  SpO2: 95%  Weight: 106 lb (48.1 kg)  Height: 5\' 1"  (1.549 m)    GENERAL: The  patient is a well-nourished female, in no acute distress. The vital signs are documented above. CARDIAC: There is a regular rate and rhythm.  VASCULAR: I do not detect carotid bruits. She has a palpable right femoral pulse.  I cannot palpate a left femoral pulse. I cannot palpate pedal pulses. PULMONARY: There is good air exchange bilaterally without wheezing or rales. ABDOMEN: Soft and non-tender with normal pitched bowel sounds.  MUSCULOSKELETAL: There are no major deformities or cyanosis. NEUROLOGIC: No focal weakness or paresthesias are detected. SKIN: There are no ulcers or rashes noted. PSYCHIATRIC: The patient has a normal affect.  DATA:    ARTERIAL DOPPLER STUDY: I have independently interpreted her arterial Doppler study today.  On the right side there is a monophasic dorsalis pedis signal only.  ABIs 49%.  Toe pressure 65 mmHg.  On the left side there is a monophasic dorsalis pedis signal only.  ABIs 33%.  Toe pressures 57 mmHg.  Waverly Ferrari [facility] [phone]

## 2022-06-05 ENCOUNTER — Other Ambulatory Visit: Payer: Self-pay

## 2022-06-05 DIAGNOSIS — I739 Peripheral vascular disease, unspecified: Secondary | ICD-10-CM

## 2022-06-05 DIAGNOSIS — I7409 Other arterial embolism and thrombosis of abdominal aorta: Secondary | ICD-10-CM

## 2022-07-15 DIAGNOSIS — H04123 Dry eye syndrome of bilateral lacrimal glands: Secondary | ICD-10-CM | POA: Diagnosis not present

## 2022-07-15 DIAGNOSIS — H02834 Dermatochalasis of left upper eyelid: Secondary | ICD-10-CM | POA: Diagnosis not present

## 2022-07-15 DIAGNOSIS — H02831 Dermatochalasis of right upper eyelid: Secondary | ICD-10-CM | POA: Diagnosis not present

## 2022-07-15 DIAGNOSIS — D3131 Benign neoplasm of right choroid: Secondary | ICD-10-CM | POA: Diagnosis not present

## 2022-07-15 DIAGNOSIS — E119 Type 2 diabetes mellitus without complications: Secondary | ICD-10-CM | POA: Diagnosis not present

## 2022-07-15 DIAGNOSIS — Z79899 Other long term (current) drug therapy: Secondary | ICD-10-CM | POA: Diagnosis not present

## 2022-07-15 DIAGNOSIS — L400 Psoriasis vulgaris: Secondary | ICD-10-CM | POA: Diagnosis not present

## 2022-07-15 DIAGNOSIS — Z961 Presence of intraocular lens: Secondary | ICD-10-CM | POA: Diagnosis not present

## 2022-07-15 DIAGNOSIS — H26493 Other secondary cataract, bilateral: Secondary | ICD-10-CM | POA: Diagnosis not present

## 2022-07-15 DIAGNOSIS — L821 Other seborrheic keratosis: Secondary | ICD-10-CM | POA: Diagnosis not present

## 2022-07-15 LAB — HM DIABETES EYE EXAM

## 2022-08-26 ENCOUNTER — Ambulatory Visit (INDEPENDENT_AMBULATORY_CARE_PROVIDER_SITE_OTHER): Payer: 59

## 2022-08-26 ENCOUNTER — Ambulatory Visit: Payer: 59 | Admitting: Internal Medicine

## 2022-08-26 VITALS — BP 156/61 | HR 71 | Temp 97.6°F | Ht 61.0 in | Wt 104.2 lb

## 2022-08-26 DIAGNOSIS — E7841 Elevated Lipoprotein(a): Secondary | ICD-10-CM

## 2022-08-26 DIAGNOSIS — E785 Hyperlipidemia, unspecified: Secondary | ICD-10-CM | POA: Diagnosis not present

## 2022-08-26 DIAGNOSIS — R634 Abnormal weight loss: Secondary | ICD-10-CM

## 2022-08-26 DIAGNOSIS — I130 Hypertensive heart and chronic kidney disease with heart failure and stage 1 through stage 4 chronic kidney disease, or unspecified chronic kidney disease: Secondary | ICD-10-CM

## 2022-08-26 DIAGNOSIS — Z0000 Encounter for general adult medical examination without abnormal findings: Secondary | ICD-10-CM | POA: Diagnosis not present

## 2022-08-26 DIAGNOSIS — M810 Age-related osteoporosis without current pathological fracture: Secondary | ICD-10-CM

## 2022-08-26 DIAGNOSIS — I10 Essential (primary) hypertension: Secondary | ICD-10-CM

## 2022-08-26 DIAGNOSIS — I5032 Chronic diastolic (congestive) heart failure: Secondary | ICD-10-CM

## 2022-08-26 DIAGNOSIS — E1122 Type 2 diabetes mellitus with diabetic chronic kidney disease: Secondary | ICD-10-CM | POA: Diagnosis not present

## 2022-08-26 DIAGNOSIS — Z951 Presence of aortocoronary bypass graft: Secondary | ICD-10-CM

## 2022-08-26 DIAGNOSIS — N1832 Chronic kidney disease, stage 3b: Secondary | ICD-10-CM

## 2022-08-26 DIAGNOSIS — K5900 Constipation, unspecified: Secondary | ICD-10-CM

## 2022-08-26 DIAGNOSIS — N3941 Urge incontinence: Secondary | ICD-10-CM

## 2022-08-26 DIAGNOSIS — N1831 Chronic kidney disease, stage 3a: Secondary | ICD-10-CM

## 2022-08-26 DIAGNOSIS — E1151 Type 2 diabetes mellitus with diabetic peripheral angiopathy without gangrene: Secondary | ICD-10-CM

## 2022-08-26 DIAGNOSIS — Z1211 Encounter for screening for malignant neoplasm of colon: Secondary | ICD-10-CM

## 2022-08-26 DIAGNOSIS — I739 Peripheral vascular disease, unspecified: Secondary | ICD-10-CM

## 2022-08-26 DIAGNOSIS — Z794 Long term (current) use of insulin: Secondary | ICD-10-CM | POA: Diagnosis not present

## 2022-08-26 LAB — POCT GLYCOSYLATED HEMOGLOBIN (HGB A1C): Hemoglobin A1C: 7.3 % — AB (ref 4.0–5.6)

## 2022-08-26 LAB — GLUCOSE, CAPILLARY: Glucose-Capillary: 149 mg/dL — ABNORMAL HIGH (ref 70–99)

## 2022-08-26 MED ORDER — METFORMIN HCL 500 MG PO TABS: 500.0000 mg | ORAL_TABLET | Freq: Every day | ORAL | 3 refills | Status: DC

## 2022-08-26 MED ORDER — ROSUVASTATIN CALCIUM 20 MG PO TABS: 20.0000 mg | ORAL_TABLET | Freq: Every day | ORAL | 3 refills | Status: DC

## 2022-08-26 MED ORDER — VITAMIN D3 50 MCG (2000 UT) PO TABS: 2000.0000 [IU] | ORAL_TABLET | Freq: Every day | ORAL | Status: AC

## 2022-08-26 NOTE — Assessment & Plan Note (Addendum)
CBGs excellent; A1c 7.3. She has reduced her metformin from 1000 to 500 mg daily, to try to address constipation upon advice of her friends.  Constipation hasn't improved.  A1c reflects this dose and will adjust her prescription instructions accordingly to reflect what she's taking.  Continue  empagliflozin 25 mg daily, and Lantus 20 units daily. Dr. Dione Booze eye appt completed 06/2022.

## 2022-08-26 NOTE — Progress Notes (Signed)
Subjective:   [name] is a 82 y.o. female who presents for Medicare Annual (Subsequent) preventive examination.  Visit Complete: In person  Patient Medicare AWV questionnaire was completed by the patient on 08/26/2022; I have confirmed that all information answered by patient is correct and no changes since this date.  Review of Systems    Defer to PCP       Objective:    Today's Vitals   08/26/22 1419 08/26/22 1420  BP: (!) 152/56 (!) 156/61  Pulse: 76 71  Temp: 97.6 F (36.4 C)   TempSrc: Oral   SpO2: 99%   Weight: 104 lb 3.2 oz (47.3 kg)   Height: 5\' 1"  (1.549 m)   PainSc: 0-No pain    Body mass index is 19.69 kg/m.     08/26/2022    2:20 PM 08/26/2022   12:06 PM 05/01/2022   10:53 AM 01/23/2022   11:59 AM 09/26/2021   11:37 AM 07/02/2021    1:40 PM 04/18/2021   11:13 AM  Advanced Directives  Does Patient Have a Medical Advance Directive? Yes Yes Yes Yes Yes Yes Yes  Type of Advance Directive Living will;Out of facility DNR (pink MOST or yellow form) Healthcare Power of [place];Out of facility DNR (pink MOST or yellow form);Living will Healthcare Power of [place];Living will Healthcare Power of [place];Living will Healthcare Power of [place];Living will;Out of facility DNR (pink MOST or yellow form) Healthcare Power of [place];Living will Out of facility DNR (pink MOST or yellow form);Healthcare Power of [place];Living will  Does patient want to make changes to medical advance directive?  No - Patient declined No - Patient declined No - Patient declined No - Patient declined  No - Patient declined  Copy of Healthcare Power of Attorney in Chart? No - copy requested No - copy requested Yes - validated most recent copy scanned in chart (See row information) Yes - validated most recent copy scanned in chart (See row information) Yes - validated most recent copy scanned in chart (See row information) Yes - validated most recent copy scanned in chart (See row  information) Yes - validated most recent copy scanned in chart (See row information)    Current Medications (verified) Outpatient Encounter Medications as of 08/26/2022  Medication Sig   alendronate (FOSAMAX) 70 MG tablet Take 1 tablet (70 mg total) by mouth every 7 (seven) days. Take with a full glass of water on an empty stomach. For bone health.   amlodipine-olmesartan (AZOR) 10-20 MG tablet Take 1 tablet by mouth daily.   Apremilast (OTEZLA) 30 MG TABS Take 1 tablet by mouth in the morning and at bedtime.   aspirin EC 81 MG tablet Take 1 tablet (81 mg total) by mouth daily.   augmented betamethasone dipropionate (DIPROLENE-AF) 0.05 % cream Apply topically 2 (two) times daily.   Blood Glucose Monitoring Suppl (ACCU-CHEK GUIDE ME) w/Device KIT 1 each by Does not apply route 3 (three) times daily.   Calcipotriene 0.005 % solution SMARTSIG:1 Milliliter(s) Topical Daily   Cholecalciferol (VITAMIN D3) 50 MCG (2000 UT) TABS Take 1 tablet (50 mcg total) by mouth daily.   clopidogrel (PLAVIX) 75 MG tablet TAKE 1 TABLET(75 MG) BY MOUTH DAILY   empagliflozin ([name]) 25 MG TABS tablet Take 1 tablet (25 mg total) by mouth daily before breakfast.   glucose blood (ACCU-CHEK GUIDE) test strip Check blood sugar every morning fasting   insulin glargine (LANTUS SOLOSTAR) 100 UNIT/ML Solostar Pen ADMINISTER 20 UNITS UNDER THE SKIN AT  BEDTIME   Insulin Pen Needle (BD PEN NEEDLE NANO 2ND GEN) 32G X 4 MM MISC Use for injection as prescribed   Lancets (FREESTYLE) lancets USE TO CHECK BLOOD SUGAR FOUR TIMES DAILY- BEFORE MEALS AND EVERY NIGHT AT BEDTIME   metFORMIN (GLUCOPHAGE) 500 MG tablet Take 1 tablet (500 mg total) by mouth daily with breakfast.   mirabegron ER (MYRBETRIQ) 50 MG TB24 tablet Take 1 tablet (50 mg total) by mouth daily.   nitroGLYCERIN (NITROSTAT) 0.3 MG SL tablet Place 1 tablet (0.3 mg total) under the tongue every 5 (five) minutes as needed for chest pain.   rosuvastatin (CRESTOR) 20 MG  tablet Take 1 tablet (20 mg total) by mouth daily.   [DISCONTINUED] Calcium Carb-Cholecalciferol (CALCIUM 600 + D PO) Take 1 tablet by mouth 2 (two) times daily.   No facility-administered encounter medications on file as of 08/26/2022.    Allergies (verified) Valsartan   History: Past Medical History:  Diagnosis Date   Acute colitis 10/14/2018   Bloody diarrhea 10/13/2018   CAD (coronary artery disease) 2006   s/p Quadrupal CABG 2006   CHF (congestive heart failure) (HCC) 2010   EF 30-35% from Echo 06/2008   CKD (chronic kidney disease) stage 4, GFR 15-29 ml/min (HCC)    fluctuating between stage 3 and 4 depending on GFR   CKD (chronic kidney disease), stage III (HCC) 09/27/2013   De Quervain's tenosynovitis, left 11/13/2015   Diverticulosis    DIVERTICULOSIS OF COLON 04/01/2007   Qualifier: Diagnosis of  By: Candice Camp CMA (AAMA), [name]     DM (diabetes mellitus) (HCC)    insulin dependent   Dyslipidemia 10/15/2005   Qualifier: Diagnosis of  By: Darrick Huntsman MD, Rosey Bath     History of colonic polyps    HLD (hyperlipidemia)    HTN (hypertension)    Lumbar spinal stenosis    MI (myocardial infarction) (HCC) 2003   . [place]   Mouth ulcer adjacent to jaw bone 08/22/2010   S/p removal by ENT 09/2010    MYOCARDIAL INFARCTION, HX OF 09/09/2006   Qualifier: Diagnosis of  By: Meredith Pel MD, Dorene Sorrow     Nephrolithiasis    hx of   NEPHROLITHIASIS 04/01/2007   Qualifier: Diagnosis of  By: Candice Camp CMA (AAMA), [name]     OA (osteoarthritis)    Osteopenia 2010   T score of -1.8   Pancreatic cysts, - look benign by MR 04/25/2019   Psoriasis    PVD (peripheral vascular disease) (HCC)    ABI indicated moderated reduction arterial flow on the left.   Tricuspid regurgitation 2009   moderate to severe, EF 55%   Past Surgical History:  Procedure Laterality Date   CATARACT EXTRACTION  2008   left eye   COLONOSCOPY  2008   CORONARY ANGIOPLASTY WITH STENT PLACEMENT  2003   CORONARY ARTERY  BYPASS GRAFT  2006   4 vessel   LAPAROSCOPIC SUPRACERVICAL HYSTERECTOMY     PERIPHERAL VASCULAR CATHETERIZATION N/A 07/03/2014   Procedure: Abdominal Aortogram w/Lower Extremity;  Surgeon: Chuck Hint, MD;  Location[facility];  Service: Cardiovascular;  Laterality: N/A;   Family History  Problem Relation Age of Onset   Cirrhosis Brother    Heart disease Brother    Heart attack Brother    Hypertension Mother    Hyperlipidemia Mother    Hyperlipidemia Son    Hypertension Son    Breast cancer Sister    Breast cancer Daughter    Social History  Socioeconomic History   Marital status: Widowed    Spouse name: Not on file   Number of children: Not on file   Years of education: Not on file   Highest education level: Not on file  Occupational History   Not on file  Tobacco Use   Smoking status: Never   Smokeless tobacco: Never  Vaping Use   Vaping status: Never Used  Substance and Sexual Activity   Alcohol use: No    Alcohol/week: 0.0 standard drinks of alcohol   Drug use: Not Currently   Sexual activity: Not on file  Other Topics Concern   Not on file  Social History Narrative   Retired Conservation officer, nature   Widow/Widower   lives in [place]   4 kids (2 sons, 2 daughters) one son died many years ago in car accident.  other kids are all in [place].   Has friend that helps take care of her   Social Determinants of Health   Financial Resource Strain: Low Risk  (08/26/2022)   Overall Financial Resource Strain (CARDIA)    Difficulty of Paying Living Expenses: Not hard at all  Food Insecurity: No Food Insecurity (08/26/2022)   Hunger Vital Sign    Worried About Running Out of Food in the Last Year: Never true    Ran Out of Food in the Last Year: Never true  Transportation Needs: No Transportation Needs (08/26/2022)   PRAPARE - Administrator, Civil Service (Medical): No    Lack of Transportation (Non-Medical): No  Physical Activity:  Patient Unable To Answer (08/26/2022)   Exercise Vital Sign    Days of Exercise per Week: Patient unable to answer    Minutes of Exercise per Session: Patient unable to answer  Stress: No Stress Concern Present (08/26/2022)   [employer] of Occupational Health - Occupational Stress Questionnaire    Feeling of Stress : Only a little  Social Connections: Moderately Integrated (08/26/2022)   Social Connection and Isolation Panel [NHANES]    Frequency of Communication with Friends and Family: Three times a week    Frequency of Social Gatherings with Friends and Family: Not on file    Attends Religious Services: More than 4 times per year    Active Member of Clubs or Organizations: Yes    Attends Banker Meetings: 1 to 4 times per year    Marital Status: Widowed    Tobacco Counseling Counseling given: Not Answered   Clinical Intake:  Pre-visit preparation completed: Yes  Pain : No/denies pain Pain Score: 0-No pain     Nutritional Risks: None Diabetes: Yes CBG done?: Yes CBG resulted in Enter/ Edit results?: Yes Did pt. bring in CBG monitor from home?: Yes Glucose Meter Downloaded?: Yes  How often do you need to have someone help you when you read instructions, pamphlets, or other written materials from your doctor or pharmacy?: 1 - Never  Interpreter Needed?: No  Information entered by ::  ,cma   Activities of Daily Living    08/26/2022    2:20 PM 08/26/2022   10:45 AM  In your present state of health, do you have any difficulty performing the following activities:  Hearing? 0 0  Vision? 0 0  Difficulty concentrating or making decisions? 0 0  Walking or climbing stairs? 0 0  Dressing or bathing? 0 0  Doing errands, shopping? 0 0    Patient Care Team: Miguel Aschoff, MD as PCP - General (Internal Medicine)  Arita Miss, MD as Attending Physician (Nephrology)  Indicate any recent Medical Services you may have received from other  than [facility] providers in the past year (date may be approximate).     Assessment:   This is a routine wellness examination for Arlicia.  Hearing/Vision screen No results found.  Dietary issues and exercise activities discussed:     Goals Addressed   None   Depression Screen    09/26/2021   11:39 AM 07/02/2021    1:36 PM 04/18/2021   11:06 AM 03/25/2021    1:15 PM 01/18/2021   10:40 AM 07/18/2020   10:06 AM 07/04/2020    2:29 PM  PHQ 2/9 Scores  PHQ - 2 Score 0 0 0 0 0 0 0  PHQ- 9 Score     0 0     Fall Risk    08/26/2022    2:20 PM 08/26/2022   10:45 AM 05/01/2022   10:52 AM 01/23/2022   10:48 AM 09/26/2021   11:38 AM  Fall Risk   Falls in the past year? 0 0 0 0 0  Number falls in past yr: 0 0 0 0 0  Injury with Fall? 0 0 0 0 0  Risk for fall due to : No Fall Risks      Follow up Falls evaluation completed;Falls prevention discussed Falls evaluation completed Falls evaluation completed Falls evaluation completed Falls evaluation completed    MEDICARE RISK AT HOME:  Medicare Risk at Home - 08/26/22 1422     Any stairs in or around the home? Yes    If so, are there any without handrails? No    Home free of loose throw rugs in walkways, pet beds, electrical cords, etc? No    Adequate lighting in your home to reduce risk of falls? Yes    Life alert? No    Use of a cane, walker or w/c? No    Grab bars in the bathroom? Yes    Shower chair or bench in shower? No    Elevated toilet seat or a handicapped toilet? Yes             TIMED UP AND GO:  Was the test performed?  No    Cognitive Function:        07/02/2021    1:41 PM  6CIT Screen  What Year? 0 points  What month? 0 points  What time? 0 points  Count back from 20 0 points  Months in reverse 0 points  Repeat phrase 2 points  Total Score 2 points    Immunizations Immunization History  Administered Date(s) Administered   Fluad Quad(high Dose 65+) 09/26/2021   H1N1 12/23/2007   Influenza Split  11/14/2010, 10/09/2011   Influenza Whole 10/13/2005, 10/26/2006, 12/23/2007, 10/03/2008, 09/27/2009   Influenza, High Dose Seasonal PF 01/18/2021   Influenza,inj,Quad PF,6+ Mos 09/27/2013, 10/27/2014, 09/19/2015, 10/22/2016, 10/14/2017, 11/17/2018, 09/14/2019, 12/27/2019   Moderna Sars-Covid-2 Vaccination 03/23/2019, 04/20/2019   Pneumococcal Conjugate-13 12/27/2013   Pneumococcal Polysaccharide-23 10/18/2001, 10/26/2006, 10/22/2016   Tdap 10/09/2011    TDAP status: Due, Education has been provided regarding the importance of this vaccine. Advised may receive this vaccine at local pharmacy or Health Dept. Aware to provide a copy of the vaccination record if obtained from local pharmacy or Health Dept. Verbalized acceptance and understanding.  Flu Vaccine status: Due, Education has been provided regarding the importance of this vaccine. Advised may receive this vaccine at local pharmacy or Health Dept. Aware to provide  a copy of the vaccination record if obtained from local pharmacy or Health Dept. Verbalized acceptance and understanding.  Pneumococcal vaccine status: Up to date  Covid-19 vaccine status: Information provided on how to obtain vaccines.   Qualifies for Shingles Vaccine? No   Zostavax completed No   Shingrix Completed?: No.    Education has been provided regarding the importance of this vaccine. Patient has been advised to call insurance company to determine out of pocket expense if they have not yet received this vaccine. Advised may also receive vaccine at local pharmacy or Health Dept. Verbalized acceptance and understanding.  Screening Tests Health Maintenance  Topic Date Due   Zoster Vaccines- Shingrix (1 of 2) Never done   COVID-19 Vaccine (3 - Moderna risk series) 05/18/2019   DTaP/Tdap/Td (2 - Td or Tdap) 10/08/2021   OPHTHALMOLOGY EXAM  06/26/2022   INFLUENZA VACCINE  08/14/2022   LIPID PANEL  09/27/2022   HEMOGLOBIN A1C  11/26/2022   Diabetic kidney evaluation -  eGFR measurement  01/24/2023   Diabetic kidney evaluation - Urine ACR  01/24/2023   FOOT EXAM  05/01/2023   Medicare Annual Wellness (AWV)  08/26/2023   Pneumonia Vaccine 82+ Years old  Completed   DEXA SCAN  Completed   HPV VACCINES  Aged Out    Health Maintenance  Health Maintenance Due  Topic Date Due   Zoster Vaccines- Shingrix (1 of 2) Never done   COVID-19 Vaccine (3 - Moderna risk series) 05/18/2019   DTaP/Tdap/Td (2 - Td or Tdap) 10/08/2021   OPHTHALMOLOGY EXAM  06/26/2022   INFLUENZA VACCINE  08/14/2022    Bone Density status: Completed 10/14/2021. Results reflect: Bone density results: OSTEOPOROSIS. Repeat every 0 years.  Lung Cancer Screening: (Low Dose CT Chest recommended if Age 87-80 years, 20 pack-year currently smoking OR have quit w/in 15years.) does not qualify.   Lung Cancer Screening Referral: N/A  Additional Screening:  Hepatitis C Screening: does not qualify; Completed N/A  Vision Screening: Recommended annual ophthalmology exams for early detection of glaucoma and other disorders of the eye. Is the patient up to date with their annual eye exam?  Yes  Who is the provider or what is the name of the office in which the patient attends annual eye exams? Dr.Groat If pt is not established with a provider, would they like to be referred to a provider to establish care? No .   Dental Screening: Recommended annual dental exams for proper oral hygiene  Diabetic Foot Exam: Diabetic Foot Exam: Completed 05/01/2022  Community Resource Referral / Chronic Care Management: CRR required this visit?  No   CCM required this visit?  No     Plan:     I have personally reviewed and noted the following in the patient's chart:   Medical and social history Use of alcohol, tobacco or illicit drugs  Current medications and supplements including opioid prescriptions. Patient is not currently taking opioid prescriptions. Functional ability and status Nutritional  status Physical activity Advanced directives List of other physicians Hospitalizations, surgeries, and ER visits in previous 12 months Vitals Screenings to include cognitive, depression, and falls Referrals and appointments  In addition, I have reviewed and discussed with patient certain preventive protocols, quality metrics, and best practice recommendations. A written personalized care plan for preventive services as well as general preventive health recommendations were provided to patient.     Cala Bradford, CMA   08/26/2022   After Visit Summary: (Mail) Due to this being a telephonic  visit, the after visit summary with patients personalized plan was offered to patient via mail   Nurse Notes: Face-To-Face Visit  Ms. Andria Meuse , Thank you for taking time to come for your Medicare Wellness Visit. I appreciate your ongoing commitment to your health goals. Please review the following plan we discussed and let me know if I can assist you in the future.   These are the goals we discussed:  Goals      Blood Pressure < 140/90     HDL > 40     HEMOGLOBIN A1C < 8.0     Increase physical activity     LDL CALC < 100     Reduce portion size        This is a list of the screening recommended for you and due dates:  Health Maintenance  Topic Date Due   Zoster (Shingles) Vaccine (1 of 2) Never done   COVID-19 Vaccine (3 - Moderna risk series) 05/18/2019   DTaP/Tdap/Td vaccine (2 - Td or Tdap) 10/08/2021   Eye exam for diabetics  06/26/2022   Flu Shot  08/14/2022   Lipid (cholesterol) test  09/27/2022   Hemoglobin A1C  11/26/2022   Yearly kidney function blood test for diabetes  01/24/2023   Yearly kidney health urinalysis for diabetes  01/24/2023   Complete foot exam   05/01/2023   Medicare Annual Wellness Visit  08/26/2023   Pneumonia Vaccine  Completed   DEXA scan (bone density measurement)  Completed   HPV Vaccine  Aged Out

## 2022-08-26 NOTE — Assessment & Plan Note (Signed)
Nocturia 1-3x; not drinking later on in the day as she once was.  drinks about 3 bottles of water a day, "a lot".  Minimal incontinence.  Mirebegron 50 mg daily continues.

## 2022-08-26 NOTE — Progress Notes (Signed)
Routine follow-up of HTN, T2DM, CKD 3a, HTN and PAD, last visit 05/01/2022. In 05/2022 she had a routine f/u visit with vascular surgery for monitoring of her occlusive PAD with stable claudication.  Leg claudication pain has been worsening, still tries to walk but distances are shorter and rest breaks more frequent.  Pain doesn't completely resolve during breaks.  Pain occurring during walking, but not rest pain, though she does experience "drawing pain" where feet and goes dorseflex, associated with lower anterior leg pain, relieved by Biofreeze.  Voltaren gel also helpful.  F/u appt with vascular 10/2022 but she would like to be seen earlier, preferably on a Tuesday.  Bps at home running high in 150s. Forgot her cuff and log.  Not eating much and losing weight.  Early satiety.  Belly feels bigger.  No abdominal pain, sometimes nauseated - some mornings feels as though she may have vomiting.   Reviewed all meds brought today, addressed discrepancies.  She reports having taking all meds this am around 8 am.  BP (!) 152/56 (BP Location: Right Arm, Patient Position: Sitting, Cuff Size: Small)   Pulse 76   Temp 97.6 F (36.4 C) (Oral)   Ht 5\' 1"  (1.549 m)   Wt 104 lb 3.2 oz (47.3 kg)   SpO2 99%   BMI 19.69 kg/m   Ms. Courtenay is appearing more frail as she continues to lose weight.  She remains spry.  Pushes off from chair to stand, walks with stooped careful gait. Assisted onto the exam table and was able to lie supine during exam.  Other pertinent exam findings in problem-associated documentation under assessment and plan.  Assessment and Plan for problems addressed this visit:  Type 2 diabetes mellitus with stage 3 chronic kidney disease, with long-term current use of insulin (HCC) CBGs excellent; A1c 7.3. She has reduced her metformin from 1000 to 500 mg daily, to try to address constipation upon advice of her friends.  Constipation hasn't improved.  A1c reflects this dose and will  adjust her prescription instructions accordingly to reflect what she's taking.  Continue  empagliflozin 25 mg daily, and Lantus 20 units daily. Dr. Dione Booze eye appt completed 06/2022.  Urinary urgency, infrequent incontinence,, with nocturia Nocturia 1-3x; not drinking later on in the day as she once was.  drinks about 3 bottles of water a day, "a lot".  Minimal incontinence.  Mirebegron 50 mg daily continues.    Age-related osteoporosis without current pathological fracture No falls or fractures, adherent to and tolerating alendronate.  Combo Ca-VitD will be changed to Vit D alone (advised her to get 2000 international units dose OTC or through [facility] - elimination of Ca done to address her constipation.    Unexplained weight loss Poor appetite (no food fear), early satiety, eating only about one meal daily.  No abdominal pain but does have frequent nausea, occurring on both empty and full stomach.  Associated ROS - recently more constipated (severely so, required self-manual disimpaction).  Doesn't feel ill in any other way.  No vomiting though comes close.  No dysphagia or odynophagia.  No post-prandial pain.  Infrequent diet-associated reflux well managed with TUMS.  No melena or BRBPR.  Not feeling particularly fatigued, stays very active with social and church events.  Exam notable only for mild epigastric tenderness.  Non-distended (though she feels her girth is increasing), nml bowel sounds and nml percussion sounds.  No palpable organomegaly.  Last colonoscopy 2013 with polyp(s) - shared decision  making with a former PCP resulted in cessation of further screening.  She would be willing to do colonoscopy but fears the prep.     Last mammogram this year.   Will check CBC for anemia.  Asymptomtic pancreatic cysts on former imaging, no evidence of malabsorption.  No post-prandial pain suggestive of chronic mesenteric ischemia, though she is at risk for this and the chronicity of her weight loss would be  consistent.    Stage 3b chronic kidney disease (CKD) (HCC) Remains on SGLT2i for renal protection.  Due for recheck today.    Hyperlipidemia Med review - statin bottle states rosuvastatin 20 mg daily though 40 mg daily tab is prescribed.  She has been taking 20 mg.  Due for lipid recheck regardless.  Adjust statin as needed for goal LDL < 70 secondary prevention PAD.  Peripheral vascular disease (HCC) Symptoms are worsening; cannot walk as far and takes longer to recover.  She is concerned and wishes to move her vascular surgery f/u closer.  Feet and toes are slightly cooler than normal but by no means cold.  No ulcerations or areas of impending loss of skin integrity.  She is on appropriate medical management.  Agree with expedited f/u.  Prescription written for Rollator to assist with her community mobility.  Chronic heart failure with preserved ejection fraction (HCC) Compensated, asymptomatic.  No LE edema or JVD.  Continue to monitor on current regimen.  Essential hypertension SBP 150s, same as home readings, though she endorsed taking all meds at around 8 am today.  On amlodipine-olmesartan 10-20, one daily.  Additional agent needed; check renal function today first.   Constipation Recent problem; required self-manual disimpaction;  Friends advised her to reduce her metformin "they said it caused their constipation" which she did, though no improvement.  Given progressive (though very chronic) weight loss, change in bowel habit is concerning and she hasn't had a colonoscopy since 2013 (polyp).  Advised her to stop her calcium-vit D supplement and to find a vit D supplement to take separately for her osteoporosis.  Increase fluid intake (she feels 3 small bottled waters is "a lot"). Fiber.

## 2022-08-27 NOTE — Assessment & Plan Note (Addendum)
Poor appetite (no food fear), early satiety, eating only about one meal daily.  No abdominal pain but does have frequent nausea, occurring on both empty and full stomach.  Associated ROS - recently more constipated (severely so, required self-manual disimpaction).  Doesn't feel ill in any other way.  No vomiting though comes close.  No dysphagia or odynophagia.  No post-prandial pain.  Infrequent diet-associated reflux well managed with TUMS.  No melena or BRBPR.  Not feeling particularly fatigued, stays very active with social and church events.  Exam notable only for mild epigastric tenderness.  Non-distended (though she feels her girth is increasing), nml bowel sounds and nml percussion sounds.  No palpable organomegaly.  Last colonoscopy 2013 with polyp(s) - shared decision making with a former PCP resulted in cessation of further screening.  She would be willing to do colonoscopy but fears the prep.     Last mammogram this year.   Will check CBC for anemia.  Asymptomtic pancreatic cysts on former imaging, no evidence of malabsorption.  No post-prandial pain suggestive of chronic mesenteric ischemia, though she is at risk for this and the chronicity of her weight loss would be consistent.

## 2022-08-27 NOTE — Assessment & Plan Note (Signed)
No falls or fractures, adherent to and tolerating alendronate.  Combo Ca-VitD will be changed to Vit D alone (advised her to get 2000 international units dose OTC or through [facility] - elimination of Ca done to address her constipation.

## 2022-08-27 NOTE — Assessment & Plan Note (Signed)
Remains on SGLT2i for renal protection.  Due for recheck today.

## 2022-08-27 NOTE — Assessment & Plan Note (Signed)
Med review - statin bottle states rosuvastatin 20 mg daily though 40 mg daily tab is prescribed.  She has been taking 20 mg.  Due for lipid recheck regardless.  Adjust statin as needed for goal LDL < 70 secondary prevention PAD.

## 2022-09-02 ENCOUNTER — Other Ambulatory Visit: Payer: Self-pay | Admitting: Internal Medicine

## 2022-09-02 DIAGNOSIS — E1122 Type 2 diabetes mellitus with diabetic chronic kidney disease: Secondary | ICD-10-CM

## 2022-09-02 MED ORDER — FREESTYLE LIBRE 3 SENSOR MISC
1 refills | Status: DC
Start: 2022-09-02 — End: 2022-09-10

## 2022-09-02 MED ORDER — FREESTYLE LIBRE 3 READER DEVI: 1 refills | Status: DC

## 2022-09-04 ENCOUNTER — Encounter: Payer: 59 | Admitting: Internal Medicine

## 2022-09-07 NOTE — Assessment & Plan Note (Addendum)
SBP 150s, same as home readings, though she endorsed taking all meds at around 8 am today.  On amlodipine-olmesartan 10-20, one daily.  Additional agent needed; check renal function today first.

## 2022-09-07 NOTE — Assessment & Plan Note (Addendum)
Symptoms are worsening; cannot walk as far and takes longer to recover.  She is concerned and wishes to move her vascular surgery f/u closer.  Feet and toes are slightly cooler than normal but by no means cold.  No ulcerations or areas of impending loss of skin integrity.  She is on appropriate medical management.  Agree with expedited f/u.  Prescription written for Rollator to assist with her community mobility.

## 2022-09-07 NOTE — Assessment & Plan Note (Signed)
Recent problem; required self-manual disimpaction;  Friends advised her to reduce her metformin "they said it caused their constipation" which she did, though no improvement.  Given progressive (though very chronic) weight loss, change in bowel habit is concerning and she hasn't had a colonoscopy since 2013 (polyp).  Advised her to stop her calcium-vit D supplement and to find a vit D supplement to take separately for her osteoporosis.  Increase fluid intake (she feels 3 small bottled waters is "a lot"). Fiber.

## 2022-09-07 NOTE — Assessment & Plan Note (Signed)
Compensated, asymptomatic.  No LE edema or JVD.  Continue to monitor on current regimen.

## 2022-09-08 DIAGNOSIS — I739 Peripheral vascular disease, unspecified: Secondary | ICD-10-CM | POA: Diagnosis not present

## 2022-09-10 ENCOUNTER — Telehealth: Payer: Self-pay | Admitting: Dietician

## 2022-09-10 DIAGNOSIS — E1122 Type 2 diabetes mellitus with diabetic chronic kidney disease: Secondary | ICD-10-CM

## 2022-09-10 NOTE — Telephone Encounter (Signed)
Called [name] to schedule an appointment for learning how to use Continuous glucose monitoring. Noted the prescription for the sensor is 1 with 1 refill. This is enough for 14 days. Suggest and patient agrees she would like a 3 month supply. ( Dispense 6 with 3 refills)  She also states she is waiting to hear about her lab results from 08/26/22.

## 2022-09-11 MED ORDER — FREESTYLE LIBRE 3 SENSOR MISC
3 refills | Status: DC
Start: 2022-09-11 — End: 2023-09-22

## 2022-09-16 ENCOUNTER — Other Ambulatory Visit: Payer: Self-pay | Admitting: Internal Medicine

## 2022-09-16 ENCOUNTER — Telehealth: Payer: Self-pay | Admitting: Internal Medicine

## 2022-09-16 DIAGNOSIS — K5900 Constipation, unspecified: Secondary | ICD-10-CM

## 2022-09-16 DIAGNOSIS — I739 Peripheral vascular disease, unspecified: Secondary | ICD-10-CM

## 2022-09-16 NOTE — Telephone Encounter (Signed)
Let [name] know her CGM prescriptions have been sent. She acknowledged this information.   Sh also asked about her labs and says she is waiting to hear about them before she schedules her follow up appointment. I told her I would alert our triage nurse and PCP that she is waiting to hear about her lab results.

## 2022-09-16 NOTE — Telephone Encounter (Signed)
Called to review labs and next steps.  No anemia or microcytosis.  Renal fxn stable to mildly improved (CKD3a).  Ca elevated at 11.8; albumin 4.4, phos 2.9.   No mental status change or bone pain.  Very tired, no energy (very unusual for her; she is very active); constipation has recently been a problem - at last visit I asked her to stop calcium to see if this would help; it didn't.  Took OTC medicine, had profuse watery results then has been exhausted with very small bms since then. Still feels bloated.  Hypercalcemia may be causing constipation.  Plan: KUB to assess stool burden (will guide therapy) iPTH and repeat calcium to look for cause of hypercalcemia.  On no thiazide.   She will come in 09/18/22 for lab and xray ([facility] rads)

## 2022-09-18 ENCOUNTER — Other Ambulatory Visit: Payer: 59

## 2022-09-18 ENCOUNTER — Ambulatory Visit (HOSPITAL_COMMUNITY)
Admission: RE | Admit: 2022-09-18 | Discharge: 2022-09-18 | Disposition: A | Discharge status: Home / Self Care | Payer: 59 | Source: Ambulatory Visit | Attending: Internal Medicine | Admitting: Internal Medicine

## 2022-09-18 DIAGNOSIS — R140 Abdominal distension (gaseous): Secondary | ICD-10-CM | POA: Diagnosis not present

## 2022-09-18 DIAGNOSIS — R109 Unspecified abdominal pain: Secondary | ICD-10-CM | POA: Diagnosis not present

## 2022-09-18 DIAGNOSIS — K5900 Constipation, unspecified: Secondary | ICD-10-CM | POA: Insufficient documentation

## 2022-09-19 ENCOUNTER — Other Ambulatory Visit: Payer: Self-pay | Admitting: Internal Medicine

## 2022-09-19 DIAGNOSIS — I739 Peripheral vascular disease, unspecified: Secondary | ICD-10-CM

## 2022-09-19 DIAGNOSIS — K5900 Constipation, unspecified: Secondary | ICD-10-CM

## 2022-09-19 LAB — PTH, INTACT AND CALCIUM
Calcium: 9.5 mg/dL (ref 8.7–10.3)
PTH: 19 pg/mL (ref 15–65)

## 2022-09-19 MED ORDER — BISACODYL 10 MG RE SUPP
10.0000 mg | RECTAL | 0 refills | Status: DC | PRN
Start: 2022-09-19 — End: 2022-11-11

## 2022-09-19 MED ORDER — POLYETHYLENE GLYCOL 3350 17 GM/SCOOP PO POWD
ORAL | 1 refills | Status: DC
Start: 2022-09-19 — End: 2023-09-22

## 2022-09-19 MED ORDER — LINACLOTIDE 72 MCG PO CAPS: 72.0000 ug | ORAL_CAPSULE | Freq: Every day | ORAL | 0 refills | Status: DC

## 2022-09-19 NOTE — Progress Notes (Signed)
Ca and iPTH normal. KUB by my read shows stool burden (though rectum isn't fully visible).  Awaiting official read.  No relief of constipation.  Not eating much, fluid intake insufficient (doesn't like to drink much). Has had a history of requiring manual disimpaction in the past.  Discussed options: she will try a combination of bisacodyl suppository, polyethylene glycol, and Linzess.  If no results in next few days (or if she develops severe symptoms) she will go to an ED to be evaluated for disimpaction.  Has daughters who live locally.  Her partner is out of town until Monday.

## 2022-09-30 ENCOUNTER — Telehealth: Payer: Self-pay | Admitting: Internal Medicine

## 2022-09-30 DIAGNOSIS — Z794 Long term (current) use of insulin: Secondary | ICD-10-CM

## 2022-09-30 DIAGNOSIS — E1122 Type 2 diabetes mellitus with diabetic chronic kidney disease: Secondary | ICD-10-CM

## 2022-09-30 DIAGNOSIS — K5900 Constipation, unspecified: Secondary | ICD-10-CM

## 2022-09-30 DIAGNOSIS — R634 Abnormal weight loss: Secondary | ICD-10-CM

## 2022-09-30 MED ORDER — METFORMIN HCL 500 MG PO TABS: 500.0000 mg | ORAL_TABLET | Freq: Two times a day (BID) | ORAL | 3 refills | Status: DC

## 2022-09-30 MED ORDER — LANTUS SOLOSTAR 100 UNIT/ML ~~LOC~~ SOPN
PEN_INJECTOR | SUBCUTANEOUS | 4 refills | Status: DC
Start: 2022-09-30 — End: 2022-12-23

## 2022-09-30 NOTE — Assessment & Plan Note (Addendum)
  With weight loss, she has had low fasting CBGs in 60s and 70s, symptomatic.  She has reduced her Lantus from 20 to 18 units and has decreased her metformin on her own from twice daily to daily (friends had advised her that it could cause constipation, which I shared with her would be very rare).  She agrees to increase the metformin back to twice daily with hope of further reducing her insulin requirement.

## 2022-09-30 NOTE — Telephone Encounter (Signed)
Call to check on constipation and review KUB results (finally done today).  Bowels moved after initiation of regular MiraLAX.  She had difficulty obtaining her prescription for Linzess, and actually just received it last night and has not yet started it.  I informed her that she would not need to initiated as long as her MiraLAX was effective.  Having said that, I would prefer if she lets me know if she stops responding to the MiraLAX.  Review of her symptoms-weight is 103 at home she feels as though food is getting  stuck in her chest when she swallows no gagging or regurgitating.  I advised a gastroenterology referral for this as well as to follow-up on her overdue colonoscopy, particularly in light of her weight loss.  With weight loss, she has had low fasting CBGs in 60s and 70s, symptomatic.  She has reduced her Lantus from 20 to 18 units and has decreased her metformin on her own from twice daily to daily (friends had advised her that it could cause constipation, which I shared with her would be very rare).  She agrees to increase the metformin back to twice daily with hope of further reducing her insulin requirement.  She will see vascular surgeons next month.  F/u 6 wks with me.

## 2022-09-30 NOTE — Assessment & Plan Note (Signed)
Call to check on constipation and review KUB results (finally done today).  Bowels moved after initiation of regular MiraLAX.  She had difficulty obtaining her prescription for Linzess, and actually just received it last night and has not yet started it.  I informed her that she would not need to initiated as long as her MiraLAX was effective.  Having said that, I would prefer if she lets me know if she stops responding to the MiraLAX.

## 2022-09-30 NOTE — Assessment & Plan Note (Signed)
Review of her symptoms-weight is 103 at home she feels as though food is getting  stuck in her chest when she swallows no gagging or regurgitating.  I advised a gastroenterology referral for this as well as to follow-up on her overdue colonoscopy, particularly in light of her weight loss.

## 2022-10-06 ENCOUNTER — Other Ambulatory Visit: Payer: Self-pay

## 2022-10-06 DIAGNOSIS — N3941 Urge incontinence: Secondary | ICD-10-CM

## 2022-10-07 MED ORDER — MIRABEGRON ER 50 MG PO TB24: 50.0000 mg | ORAL_TABLET | Freq: Every day | ORAL | 3 refills | Status: DC

## 2022-10-20 ENCOUNTER — Other Ambulatory Visit: Payer: Self-pay | Admitting: *Deleted

## 2022-10-20 DIAGNOSIS — I6523 Occlusion and stenosis of bilateral carotid arteries: Secondary | ICD-10-CM

## 2022-10-20 DIAGNOSIS — I7409 Other arterial embolism and thrombosis of abdominal aorta: Secondary | ICD-10-CM

## 2022-10-20 DIAGNOSIS — I739 Peripheral vascular disease, unspecified: Secondary | ICD-10-CM

## 2022-10-21 ENCOUNTER — Encounter: Payer: 59 | Admitting: Internal Medicine

## 2022-10-30 ENCOUNTER — Ambulatory Visit (INDEPENDENT_AMBULATORY_CARE_PROVIDER_SITE_OTHER)
Admission: RE | Admit: 2022-10-30 | Discharge: 2022-10-30 | Disposition: A | Discharge status: Home / Self Care | Payer: 59 | Source: Ambulatory Visit | Attending: Surgery | Admitting: Surgery

## 2022-10-30 ENCOUNTER — Ambulatory Visit (HOSPITAL_COMMUNITY)
Admission: RE | Admit: 2022-10-30 | Discharge: 2022-10-30 | Disposition: A | Discharge status: Home / Self Care | Payer: 59 | Source: Ambulatory Visit | Attending: Surgery | Admitting: Surgery

## 2022-10-30 ENCOUNTER — Ambulatory Visit (INDEPENDENT_AMBULATORY_CARE_PROVIDER_SITE_OTHER): Payer: 59 | Admitting: Physician Assistant

## 2022-10-30 VITALS — BP 138/59 | HR 74 | Temp 97.2°F | Resp 18 | Ht 59.0 in | Wt 98.7 lb

## 2022-10-30 DIAGNOSIS — I739 Peripheral vascular disease, unspecified: Secondary | ICD-10-CM

## 2022-10-30 DIAGNOSIS — I6523 Occlusion and stenosis of bilateral carotid arteries: Secondary | ICD-10-CM

## 2022-10-30 DIAGNOSIS — I7409 Other arterial embolism and thrombosis of abdominal aorta: Secondary | ICD-10-CM | POA: Insufficient documentation

## 2022-10-30 LAB — VAS US ABI WITH/WO TBI
Left ABI: 0.45
Right ABI: 0.63

## 2022-10-30 NOTE — Progress Notes (Signed)
Office Note   History of Present Illness   [name] is a 82 y.o. (1940/03/23) female who presents for surveillance of PAD. She has a history of left common iliac artery stenting in June 2016 by Dr.Dickson. She also has a history of bilateral carotid artery artery stenosis of 40 to 59%.  She returns today for follow-up.  She denies any rest pain or tissue loss.  She has stable, mild bilateral calf claudication.  She also denies any stroke or TIA-like symptoms such as slurred speech, sudden weakness/numbness, or sudden visual changes.  She takes a daily aspirin, Plavix, statin.  Current Outpatient Medications  Medication Sig Dispense Refill   alendronate (FOSAMAX) 70 MG tablet Take 1 tablet (70 mg total) by mouth every 7 (seven) days. Take with a full glass of water on an empty stomach. For bone health. 12 tablet 3   amlodipine-olmesartan (AZOR) 10-20 MG tablet Take 1 tablet by mouth daily. 90 tablet 3   Apremilast (OTEZLA) 30 MG TABS Take 1 tablet by mouth in the morning and at bedtime.     aspirin EC 81 MG tablet Take 1 tablet (81 mg total) by mouth daily. 90 tablet 3   augmented betamethasone dipropionate (DIPROLENE-AF) 0.05 % cream Apply topically 2 (two) times daily.     bisacodyl (DULCOLAX) 10 MG suppository Place 1 suppository (10 mg total) rectally as needed for moderate constipation. 3 suppository 0   Blood Glucose Monitoring Suppl (ACCU-CHEK GUIDE ME) w/Device KIT 1 each by Does not apply route 3 (three) times daily. 1 kit 1   Calcipotriene 0.005 % solution SMARTSIG:1 Milliliter(s) Topical Daily (Patient not taking: Reported on 10/30/2022)     Cholecalciferol (VITAMIN D3) 50 MCG (2000 UT) TABS Take 1 tablet (50 mcg total) by mouth daily.     clopidogrel (PLAVIX) 75 MG tablet TAKE 1 TABLET(75 MG) BY MOUTH DAILY 90 tablet 3   Continuous Glucose Receiver (FREESTYLE LIBRE 3 READER) DEVI Use as directed 1 each 1   Continuous Glucose Sensor (FREESTYLE LIBRE 3 SENSOR) MISC  Place new sensor every 14 days. Use to monitor blood sugar continuously. 6 each 3   empagliflozin ([name]) 25 MG TABS tablet Take 1 tablet (25 mg total) by mouth daily before breakfast. 90 tablet 3   glucose blood (ACCU-CHEK GUIDE) test strip Check blood sugar every morning fasting 300 each 3   insulin glargine (LANTUS SOLOSTAR) 100 UNIT/ML Solostar Pen ADMINISTER 18 UNITS UNDER THE SKIN AT BEDTIME 45 mL 4   Insulin Pen Needle (BD PEN NEEDLE NANO 2ND GEN) 32G X 4 MM MISC Use for injection as prescribed 200 each 1   Lancets (FREESTYLE) lancets USE TO CHECK BLOOD SUGAR FOUR TIMES DAILY- BEFORE MEALS AND EVERY NIGHT AT BEDTIME 100 each 5   linaclotide (LINZESS) 72 MCG capsule Take 1 capsule (72 mcg total) by mouth daily before breakfast for 5 days. 5 capsule 0   metFORMIN (GLUCOPHAGE) 500 MG tablet Take 1 tablet (500 mg total) by mouth 2 (two) times daily with a meal. 180 tablet 3   mirabegron ER (MYRBETRIQ) 50 MG TB24 tablet Take 1 tablet (50 mg total) by mouth daily. 90 tablet 3   nitroGLYCERIN (NITROSTAT) 0.3 MG SL tablet Place 1 tablet (0.3 mg total) under the tongue every 5 (five) minutes as needed for chest pain. 30 tablet 1   polyethylene glycol powder (GLYCOLAX) 17 GM/SCOOP powder Mix one package (or dose) in full glass of liquid to dissolve .  Drink one  dose each morning. 116 g 1   rosuvastatin (CRESTOR) 20 MG tablet Take 1 tablet (20 mg total) by mouth daily. 90 tablet 3   No current facility-administered medications for this visit.    REVIEW OF SYSTEMS (negative unless checked):   Cardiac:  []  Chest pain or chest pressure? []  Shortness of breath upon activity? []  Shortness of breath when lying flat? []  Irregular heart rhythm?  Vascular:   Pain in calf, thigh, or hip brought on by walking? []  Pain in feet at night that wakes you up from your sleep? []  Blood clot in your veins? []  Leg swelling?  Pulmonary:  []  Oxygen at home? []  Productive cough? []  Wheezing?  Neurologic:   []  Sudden weakness in arms or legs? []  Sudden numbness in arms or legs? []  Sudden onset of difficult speaking or slurred speech? []  Temporary loss of vision in one eye? []  Problems with dizziness?  Gastrointestinal:  []  Blood in stool? []  Vomited blood?  Genitourinary:  []  Burning when urinating? []  Blood in urine?  Psychiatric:  []  Major depression  Hematologic:  []  Bleeding problems? []  Problems with blood clotting?  Dermatologic:  []  Rashes or ulcers?  Constitutional:  []  Fever or chills?  Ear/Nose/Throat:  []  Change in hearing? []  Nose bleeds? []  Sore throat?  Musculoskeletal:  []  Back pain? []  Joint pain? []  Muscle pain?   Physical Examination   Vitals:   10/30/22 0920  BP: (!) 138/59  Pulse: 74  Resp: 18  Temp: (!) 97.2 F (36.2 C)  SpO2: 95%  Weight: 98 lb 11.2 oz (44.8 kg)  Height: 4\' 11"  (1.499 m)   Body mass index is 19.93 kg/m.  General:  WDWN in NAD; vital signs documented above Gait: Not observed HENT: WNL, normocephalic Pulmonary: normal non-labored breathing , without rales, rhonchi,  wheezing Cardiac: regular Abdomen: soft, NT, no masses Skin: without rashes Vascular Exam/Pulses: BLE warm and well perfused with brisk DP/PT doppler signals Extremities: without ischemic changes, without gangrene , without cellulitis; without open wounds;  Musculoskeletal: no muscle wasting or atrophy  Neurologic: A&O X 3;  No focal weakness or paresthesias are detected Psychiatric:  The pt has Normal affect.  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  Increased left carotid artery stenosis 60 to 79%.  Medical  Decision Making   [name] is a 82 y.o. female who presents for surveillance of PAD and carotid artery stenosis  Based on the patient's vascular studies, her ABIs are slightly increased bilaterally since her last visit.  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  She has reconstitution of the distal left external iliac artery Chronic duplex demonstrates unchanged right carotid artery stenosis of 40 to 59%.  Her left carotid artery stenosis has increased from 40 to 59% to 60 to 79% She denies any signs or symptoms of stroke such as slurred speech, sudden weakness/numbness, or amaurosis fugax.  She also denies any rest pain or tissue loss.  She has stable, bilateral calf claudication. On exam she has nonpalpable pedal pulses.  She has brisk monophasic DP/PT Doppler signals.  We are unsure of the timing when her left common iliac artery stents occluded.  Given that she has distal reconstitution of her distal left external iliac artery and her symptoms are unchanged, we will continue to manage this conservatively. Given that her left carotid artery stenosis has increased, we can repeat surveillance in 6 months.  At her next follow-up she can get carotid studies, aortoiliac duplex, and ABIs   Loel Dubonnet PA-C [facility] Office: [phone]  Clinic MD: Karin Lieu

## 2022-11-11 ENCOUNTER — Ambulatory Visit: Payer: 59 | Admitting: Internal Medicine

## 2022-11-11 ENCOUNTER — Encounter: Payer: Self-pay | Admitting: Internal Medicine

## 2022-11-11 VITALS — BP 154/52 | HR 69 | Temp 97.7°F | Ht 59.0 in | Wt 99.2 lb

## 2022-11-11 DIAGNOSIS — Z951 Presence of aortocoronary bypass graft: Secondary | ICD-10-CM

## 2022-11-11 DIAGNOSIS — Z794 Long term (current) use of insulin: Secondary | ICD-10-CM | POA: Diagnosis not present

## 2022-11-11 DIAGNOSIS — Z23 Encounter for immunization: Secondary | ICD-10-CM | POA: Diagnosis not present

## 2022-11-11 DIAGNOSIS — J00 Acute nasopharyngitis [common cold]: Secondary | ICD-10-CM

## 2022-11-11 DIAGNOSIS — M810 Age-related osteoporosis without current pathological fracture: Secondary | ICD-10-CM

## 2022-11-11 DIAGNOSIS — E1122 Type 2 diabetes mellitus with diabetic chronic kidney disease: Secondary | ICD-10-CM

## 2022-11-11 DIAGNOSIS — N1831 Chronic kidney disease, stage 3a: Secondary | ICD-10-CM | POA: Diagnosis not present

## 2022-11-11 DIAGNOSIS — E785 Hyperlipidemia, unspecified: Secondary | ICD-10-CM

## 2022-11-11 DIAGNOSIS — R413 Other amnesia: Secondary | ICD-10-CM

## 2022-11-11 DIAGNOSIS — K921 Melena: Secondary | ICD-10-CM | POA: Diagnosis not present

## 2022-11-11 DIAGNOSIS — K5900 Constipation, unspecified: Secondary | ICD-10-CM | POA: Diagnosis not present

## 2022-11-11 DIAGNOSIS — Z860100 Personal history of colon polyps, unspecified: Secondary | ICD-10-CM

## 2022-11-11 DIAGNOSIS — R634 Abnormal weight loss: Secondary | ICD-10-CM

## 2022-11-11 DIAGNOSIS — N3941 Urge incontinence: Secondary | ICD-10-CM

## 2022-11-11 DIAGNOSIS — I739 Peripheral vascular disease, unspecified: Secondary | ICD-10-CM

## 2022-11-11 LAB — POCT GLYCOSYLATED HEMOGLOBIN (HGB A1C): Hemoglobin A1C: 7 % — AB (ref 4.0–5.6)

## 2022-11-11 LAB — GLUCOSE, CAPILLARY: Glucose-Capillary: 224 mg/dL — ABNORMAL HIGH (ref 70–99)

## 2022-11-11 MED ORDER — MIRABEGRON ER 50 MG PO TB24: 50.0000 mg | ORAL_TABLET | Freq: Every day | ORAL | 3 refills | Status: DC

## 2022-11-11 MED ORDER — CLOPIDOGREL BISULFATE 75 MG PO TABS
ORAL_TABLET | ORAL | 3 refills | Status: DC
Start: 2022-11-11 — End: 2023-09-22

## 2022-11-11 MED ORDER — ALENDRONATE SODIUM 70 MG PO TABS: 70.0000 mg | ORAL_TABLET | ORAL | 3 refills | Status: DC

## 2022-11-11 MED ORDER — ROSUVASTATIN CALCIUM 20 MG PO TABS: 20.0000 mg | ORAL_TABLET | Freq: Every day | ORAL | 3 refills | Status: DC

## 2022-11-11 NOTE — Assessment & Plan Note (Signed)
A1c 7.1 today on metformin 500 mg bid; lantus 18 u daily;  empagliflozin 25 mg daily.  At goal. No lows.  Eye - Dr. Dione Booze 06/2022 (not updated in our records) Feet - UTD, no lesions Kidney - UTD monitoring (see separate problem for CKD)

## 2022-11-11 NOTE — Assessment & Plan Note (Signed)
Memory concerns? She didn't recall having colitis or a CT of her abdomen in 2024.  Cognitive screen at next visit planned.

## 2022-11-11 NOTE — Assessment & Plan Note (Signed)
OTC PEG fairly effective.  Bowels are moving, though she feels incomplete evacuation.  Stools have been soft, no straining.  She has noted a couple of episodes of mucousy bowel movements with blood over the past couple of months which has improved.  In light of her weight loss evaluation, she will be referred to gastroenterology.  She had previously been fearful of the prep but is now willing to undergo any testing "if it will help figure out what is wrong and help me live longer I will do it".

## 2022-11-11 NOTE — Progress Notes (Signed)
Two meals a day, not large, still losing weight.  Not very hungry. No dysphagia or odynophagia. No postprandial sxs such as abdominal pain or nausea. Constipation better with PEG but not experiencing complete emptying- stools are soft, just not of large/satisfactory quantity. She hesitated to bring this up but "Sometimes there is blood", "not here lately, not as bad as it was", (which she thinks was a month or two ago? Stools were ,mucousy with a bit of blood, no cramping.  Cancer runs in family, though no known colon ca.  She has hesitated about getting her colonoscopy done.  Sleeping ok (hx of insomnia), though last night awakened herself while talking in her sleep.  Doesn't recall doing this in the past.  No energy, not as active, feeling very down about her health.  Feet hurt at night, biofreeze. Continues to have walking distance limited by claudication (recently had her f/u with vascular; no changes made).  Mild dry cough one week, no other viral sxs, mild pain in back during cough, no dypnea. Her close friend also has this symptom. Taking Coricin HB.  Doing ok. Inquires about flu shot and Covid shot. Flu to be done today, Covid at a local pharmacy. She agrees.  Patient Active Problem List   Diagnosis Date Noted   Memory changes 11/11/2022   Carotid stenosis, asymptomatic, bilateral 09/26/2021   Chronic heart failure with preserved ejection fraction (HCC) 04/18/2021   Urinary urgency, infrequent incontinence,, with nocturia 07/18/2020   Healthcare maintenance 12/27/2019   Chronic insomnia 05/18/2019   Pancreatic cysts, - look benign by MR 04/25/2019   Unexplained weight loss 03/17/2019   Hyperlipidemia 08/05/2018   GERD (gastroesophageal reflux disease) 01/17/2015   Stage 3b chronic kidney disease (CKD) (HCC) 09/27/2013   Localized, secondary osteoarthritis of the shoulder region 04/19/2013   Colon cancer screening 12/29/2012   Mitral valve regurgitation 04/22/2010   Essential  hypertension 09/09/2006   Constipation 09/09/2006   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 For bone health.   amlodipine-olmesartan (AZOR) 10-20 MG tablet 1 tablet, Oral, Daily   Apremilast (OTEZLA) 30 MG TABS 1 tablet, Oral, 2 times daily   aspirin EC 81 mg, Oral, Daily   augmented betamethasone dipropionate (DIPROLENE-AF) 0.05 % cream Topical, 2 times daily   Blood Glucose Monitoring Suppl (ACCU-CHEK GUIDE ME) w/Device KIT 1 each, Does not apply, 3 times daily   Chlorpheniramine-Acetaminophen (CORICIDIN HBP COLD/FLU PO) Oral   clopidogrel (PLAVIX) 75 MG tablet TAKE 1 TABLET(75 MG) BY MOUTH DAILY   Continuous Glucose Receiver (FREESTYLE LIBRE 3 READER) DEVI Use as directed   Continuous Glucose Sensor (FREESTYLE LIBRE 3 SENSOR) MISC Place new sensor every 14 days. Use to monitor blood sugar continuously.   empagliflozin ([name]) 25 mg, Oral, Daily before breakfast   glucose blood (ACCU-CHEK GUIDE) test strip Check blood sugar every morning fasting   insulin glargine (LANTUS SOLOSTAR) 100 UNIT/ML Solostar Pen ADMINISTER 18 UNITS UNDER THE SKIN AT BEDTIME   Insulin Pen Needle (BD PEN NEEDLE NANO 2ND GEN) 32G X 4 MM MISC Use for injection  as prescribed   Lancets (FREESTYLE) lancets USE TO CHECK BLOOD SUGAR FOUR TIMES DAILY- BEFORE MEALS AND EVERY NIGHT AT BEDTIME   metFORMIN (GLUCOPHAGE) 500 mg, Oral, 2 times daily with meals   mirabegron ER (MYRBETRIQ) 50 mg, Oral, Daily   nitroGLYCERIN (NITROSTAT) 0.3 mg, Sublingual,  Every 5 min PRN   polyethylene glycol powder (GLYCOLAX) 17 GM/SCOOP powder Mix one package (or dose) in full glass of liquid to dissolve .  Drink one dose each morning.   rosuvastatin (CRESTOR) 20 mg, Oral, Daily   Vitamin D3 50 mcg, Oral, Daily   Needs Rfs today for rosuvastatin, Myrbetriq, and clopidogrel  Objective BP (!) 154/52 (BP Location: Left Arm, Patient Position: Sitting, Cuff Size: Small)   Pulse 69   Temp 97.7 F (36.5 C) (Oral)   Ht 4\' 11"  (1.499 m)   Wt 99 lb 3.2 oz (45 kg)   SpO2 100%   BMI 20.04 kg/m  Small habitus, appears tired and worried Color good, pink nailbeds, warm fingers, good CR Skin turgor poor (she tries to maintain fluid intake, not much this morning) L carotid bruit, R subclavian bruits Bounding pulses Heart rrr soft syst LUSB Abd:  periumbilica and iliac bruits; soft, uncomfortable with pressure, no discrete masses, no distention, normal bowel sounds.   Problems addressed today:  Memory changes Memory concerns? She didn't recall having colitis or a CT of her abdomen in 2024.  Cognitive screen at next visit planned.  Controlled diabetes mellitus type 2 with complications (HCC) A1c 7.1 today on metformin 500 mg bid; lantus 18 u daily;  empagliflozin 25 mg daily.  At goal. No lows.  Eye - Dr. Dione Booze 06/2022 (not updated in our records) Feet - UTD, no lesions Kidney - UTD monitoring (see separate problem for CKD)  Chronic kidney disease, stage 3a (HCC) At last visit 08/2022, creatinine had improved to 1.04 (eGFR 53) from 1.21 (eGFR 44) the prior January. DM2 well-controlled.  On empagliflozin for renal protection.  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  Recheck today (obtaining CMP anyway as part of assessment for unintended weight loss).  Unexplained weight loss Unintended weight loss continues, now at 99 pounds (BMI 20), 5 additional pounds lost in the past 6 weeks, 15 pounds in 1 year.  Differential remains broad and includes mesenteric ischemia (abdominal and  periumbilical bruit on exam today; known significant peripheral vascular disease); pancreatic disease (a review of prior scans shows a pancreatic lesion in 2021 which had benign characteristics at that time but was to have been followed up); inflammatory bowel disease (mucousy bloody stools reported today-experienced colitis in 2020); and malignancy, among others.  Hyperthyroidism has been ruled out.  Will place order for contrasted CT of the abdomen and pelvis.  Refer to GI Dr. Leone Payor whom she briefly saw in 2021.  She is overdue for colonoscopy but given her symptoms it would be more appropriately diagnostic than screening.  She is in agreement.  CMP to follow-up albumin, liver and kidney function and ensure electrolyte stability during this period of weight loss.  Constipation OTC PEG fairly effective.  Bowels are moving, though she feels incomplete evacuation.  Stools have been soft, no straining.  She has noted a couple of episodes of mucousy bowel movements with blood over the past couple of months which has improved.  In light of her weight loss evaluation, she will be referred to gastroenterology.  She had previously been fearful of the prep but is now willing to undergo any testing "if it will  help figure out what is wrong and help me live longer I will do it".

## 2022-11-11 NOTE — Assessment & Plan Note (Addendum)
Unintended weight loss continues, now at 99 pounds (BMI 20), 5 additional pounds lost in the past 6 weeks, 15 pounds in 1 year.  Differential remains broad and includes mesenteric ischemia (abdominal and periumbilical bruit on exam today; known significant peripheral vascular disease); pancreatic disease (a review of prior scans shows a pancreatic lesion in 2021 which had benign characteristics at that time but was to have been followed up); inflammatory bowel disease (mucousy bloody stools reported today-experienced colitis in 2020); and malignancy, among others.  Hyperthyroidism has been ruled out.  Will place order for contrasted CT of the abdomen and pelvis.  Refer to GI Dr. Leone Payor whom she briefly saw in 2021.  She is overdue for colonoscopy but given her symptoms it would be more appropriately diagnostic than screening.  She is in agreement.  CMP to follow-up albumin, liver and kidney function and ensure electrolyte stability during this period of weight loss.

## 2022-11-11 NOTE — Assessment & Plan Note (Signed)
At last visit 08/2022, creatinine had improved to 1.04 (eGFR 53) from 1.21 (eGFR 44) the prior January. DM2 well-controlled.  On empagliflozin for renal protection.  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  Recheck today (obtaining CMP anyway as part of assessment for unintended weight loss).

## 2022-11-11 NOTE — Patient Instructions (Signed)
[name],  I'm glad you came in today.  We discussed your concerning weight loss and continuing to feel poorly, and will proceed with CT scan of your abdomen and pelvic to look at the organs and circulation in detail.  I'm also going to send you over to see the gastroenterologist.  It's important to get an answer to your symptoms as soon as we can.   I'll call you with your blood test results.  Let's get together in a month.  Please let me know if any new concerns arise in the meantime.  Take care and stay well,  Dr. Mayford Knife

## 2022-11-12 LAB — CBC
Hematocrit: 36.6 % (ref 34.0–46.6)
Hemoglobin: 11.9 g/dL (ref 11.1–15.9)
MCH: 29.2 pg (ref 26.6–33.0)
MCHC: 32.5 g/dL (ref 31.5–35.7)
MCV: 90 fL (ref 79–97)
Platelets: 225 10*3/uL (ref 150–450)
RBC: 4.08 x10E6/uL (ref 3.77–5.28)
RDW: 12.6 % (ref 11.7–15.4)
WBC: 6.2 10*3/uL (ref 3.4–10.8)

## 2022-11-12 LAB — CMP14 + ANION GAP
ALT: 15 [IU]/L (ref 0–32)
AST: 17 [IU]/L (ref 0–40)
Albumin: 4.1 g/dL (ref 3.7–4.7)
Alkaline Phosphatase: 61 [IU]/L (ref 44–121)
Anion Gap: 18 mmol/L (ref 10.0–18.0)
BUN/Creatinine Ratio: 19 (ref 12–28)
BUN: 26 mg/dL (ref 8–27)
Bilirubin Total: <0.2 mg/dL (ref 0.0–1.2)
CO2: 20 mmol/L (ref 20–29)
Calcium: 9.1 mg/dL (ref 8.7–10.3)
Chloride: 103 mmol/L (ref 96–106)
Creatinine, Ser: 1.37 mg/dL — ABNORMAL HIGH (ref 0.57–1.00)
Globulin, Total: 2.3 g/dL (ref 1.5–4.5)
Glucose: 250 mg/dL — ABNORMAL HIGH (ref 70–99)
Potassium: 4.5 mmol/L (ref 3.5–5.2)
Sodium: 141 mmol/L (ref 134–144)
Total Protein: 6.4 g/dL (ref 6.0–8.5)
eGFR: 38 mL/min/{1.73_m2} — ABNORMAL LOW (ref >59)

## 2022-11-25 ENCOUNTER — Other Ambulatory Visit: Payer: Self-pay

## 2022-11-25 DIAGNOSIS — I739 Peripheral vascular disease, unspecified: Secondary | ICD-10-CM

## 2022-11-25 DIAGNOSIS — I6523 Occlusion and stenosis of bilateral carotid arteries: Secondary | ICD-10-CM

## 2022-11-25 DIAGNOSIS — I70219 Atherosclerosis of native arteries of extremities with intermittent claudication, unspecified extremity: Secondary | ICD-10-CM

## 2022-12-04 ENCOUNTER — Encounter: Payer: Self-pay | Admitting: Dietician

## 2022-12-08 ENCOUNTER — Ambulatory Visit (HOSPITAL_COMMUNITY)
Admission: RE | Admit: 2022-12-08 | Discharge: 2022-12-08 | Disposition: A | Discharge status: Home / Self Care | Payer: 59 | Source: Ambulatory Visit | Attending: Internal Medicine | Admitting: Internal Medicine

## 2022-12-08 DIAGNOSIS — I701 Atherosclerosis of renal artery: Secondary | ICD-10-CM | POA: Diagnosis not present

## 2022-12-08 DIAGNOSIS — I700 Atherosclerosis of aorta: Secondary | ICD-10-CM | POA: Diagnosis not present

## 2022-12-08 DIAGNOSIS — I739 Peripheral vascular disease, unspecified: Secondary | ICD-10-CM | POA: Insufficient documentation

## 2022-12-08 DIAGNOSIS — R634 Abnormal weight loss: Secondary | ICD-10-CM | POA: Diagnosis not present

## 2022-12-08 DIAGNOSIS — N281 Cyst of kidney, acquired: Secondary | ICD-10-CM | POA: Diagnosis not present

## 2022-12-08 MED ORDER — IOHEXOL 350 MG/ML SOLN
65.0000 mL | Freq: Once | INTRAVENOUS | Status: AC | PRN
  Administered 2022-12-08: 65 mL via INTRAVENOUS

## 2022-12-15 ENCOUNTER — Other Ambulatory Visit: Payer: Self-pay

## 2022-12-15 DIAGNOSIS — I10 Essential (primary) hypertension: Secondary | ICD-10-CM

## 2022-12-23 ENCOUNTER — Ambulatory Visit: Payer: 59 | Admitting: Internal Medicine

## 2022-12-23 ENCOUNTER — Encounter: Payer: Self-pay | Admitting: Internal Medicine

## 2022-12-23 VITALS — BP 141/64 | HR 76 | Temp 98.1°F | Ht 59.0 in | Wt 97.0 lb

## 2022-12-23 DIAGNOSIS — R634 Abnormal weight loss: Secondary | ICD-10-CM | POA: Diagnosis not present

## 2022-12-23 DIAGNOSIS — I10 Essential (primary) hypertension: Secondary | ICD-10-CM

## 2022-12-23 DIAGNOSIS — Z794 Long term (current) use of insulin: Secondary | ICD-10-CM

## 2022-12-23 DIAGNOSIS — I129 Hypertensive chronic kidney disease with stage 1 through stage 4 chronic kidney disease, or unspecified chronic kidney disease: Secondary | ICD-10-CM

## 2022-12-23 DIAGNOSIS — E1122 Type 2 diabetes mellitus with diabetic chronic kidney disease: Secondary | ICD-10-CM

## 2022-12-23 DIAGNOSIS — N1832 Chronic kidney disease, stage 3b: Secondary | ICD-10-CM

## 2022-12-23 MED ORDER — LANTUS SOLOSTAR 100 UNIT/ML ~~LOC~~ SOPN: PEN_INJECTOR | SUBCUTANEOUS | 4 refills | Status: DC

## 2022-12-23 MED ORDER — AMLODIPINE-OLMESARTAN 10-20 MG PO TABS
1.0000 | ORAL_TABLET | Freq: Every day | ORAL | 3 refills | Status: DC
Start: 2022-12-23 — End: 2023-09-22

## 2022-12-23 NOTE — Assessment & Plan Note (Addendum)
Has lost 2 additional # x 6wks, now 97#; BMI 19.6.  CTA abdomen confirms atherosclerosis of mesenteric vessels though distal perfusion is reconstituted.  Pancreatic cyst haven't changed over time and have benign radiographic appearance.  Bowel without thickening to suggest colitis.  She has been eating, but appetite remains poor and she frequently doesn't finish a meal.  No nausea or abdominal pain with eating. Constipation is better controlled but still not experiencing complete emptying.  Next step is GI evaluation with Dr. Leone Payor 01/2023.  She remains hesitant to have a colonoscopy and want to discuss further.

## 2022-12-23 NOTE — Patient Instructions (Addendum)
[name] to see you today - you look like you're feeling better! Your energy during the holiday season is admirable.    We reviewed your CT scan today.  It showed that there are some old blockages in the arteries supplying your organs but that these blockages have been "bypassed" by some other blood vessels, ensuring that your organs are getting blood.  We are decreasing your insulin to 10 units every night.  We'll recheck A1c and your kidney function in 54M.  You need to drink more fluids, I can tell that!  I'm glad you're going to see Dr. Leone Payor.  Listen to what he has to say, ask questions, and then make up your mind about what you do or don't want to do.  You're in charge!  Merry Christmas to you and yours,  Dr. Mayford Knife,

## 2022-12-23 NOTE — Assessment & Plan Note (Signed)
eGFR 11/11/22 had decreased to stage 3b.  She is underhydrated on exam today.  Will recheck bmp and urine ACR at next visit. Stenosis of each renal artery is noted, with distal perfusion maintained.  Will continue to monitor empagliflozin and metformin for appropriateness with regard to renal fxn.

## 2022-12-23 NOTE — Progress Notes (Signed)
82 y.o. 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  Since last visit patient has been scheduled with [facility] Dr. Leone Payor; appt in 01/2023 (she has received patient paperwork for completion).   She may be doing a little better.  Very busy with holiday cooking and gift giving preparations.  Bowels are moving more regularly but not as satisfactorily as she would like, though she doesn't take PEG daily.    Patient Active Problem List   Diagnosis Date Noted   Memory changes 11/11/2022   Carotid stenosis, asymptomatic, bilateral 09/26/2021   Chronic heart failure with preserved ejection fraction (HCC) 04/18/2021   Urinary urgency, infrequent incontinence,, with nocturia 07/18/2020   Healthcare maintenance 12/27/2019   Chronic insomnia 05/18/2019   Pancreatic cysts, - look benign by MR 04/25/2019   Unexplained weight loss 03/17/2019   Hyperlipidemia 08/05/2018   GERD (gastroesophageal reflux disease) 01/17/2015   Chronic kidney disease, stage 3b (HCC) 09/27/2013   Localized, secondary osteoarthritis of the shoulder region 04/19/2013   Personal history of colon polyps, unspecified 12/29/2012   Mitral valve regurgitation 04/22/2010   Essential hypertension 09/09/2006   Constipation 09/09/2006   Osteoarthritis of back 12/23/2005   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/15/2005   Peripheral vascular disease (HCC) 10/15/2005   Psoriasis 10/15/2005   Age-related osteoporosis without current pathological fracture 10/15/2005   Controlled diabetes mellitus type 2 with complications (HCC) 01/14/2004    Current Outpatient Medications:    alendronate (FOSAMAX) 70 MG tablet, Take 1 tablet (70 mg total) by mouth every 7 (seven) days. Take with a full glass of water on an empty stomach. For bone health., Disp: 12 tablet, Rfl: 3    amlodipine-olmesartan (AZOR) 10-20 MG tablet, Take 1 tablet by mouth daily., Disp: 90 tablet, Rfl: 3   Apremilast (OTEZLA) 30 MG TABS, Take 1 tablet by mouth in the morning and at bedtime., Disp: , Rfl:    aspirin EC 81 MG tablet, Take 1 tablet (81 mg total) by mouth daily., Disp: 90 tablet, Rfl: 3   augmented betamethasone dipropionate (DIPROLENE-AF) 0.05 % cream, Apply topically 2 (two) times daily., Disp: , Rfl:    Blood Glucose Monitoring Suppl (ACCU-CHEK GUIDE ME) w/Device KIT, 1 each by Does not apply route 3 (three) times daily., Disp: 1 kit, Rfl: 1   Cholecalciferol (VITAMIN D3) 50 MCG (2000 UT) TABS, Take 1 tablet (50 mcg total) by mouth daily., Disp: , Rfl:    clopidogrel (PLAVIX) 75 MG tablet, TAKE 1 TABLET(75 MG) BY MOUTH DAILY, Disp: 90 tablet, Rfl: 3   Continuous Glucose Receiver (FREESTYLE LIBRE 3 READER) DEVI, Use as directed, Disp: 1 each, Rfl: 1   Continuous Glucose Sensor (FREESTYLE LIBRE 3 SENSOR) MISC, Place new sensor every 14 days. Use to monitor blood sugar continuously., Disp: 6 each, Rfl: 3   empagliflozin ([name]) 25 MG TABS tablet, Take 1 tablet (25 mg total) by mouth daily before breakfast., Disp: 90 tablet, Rfl: 3   glucose blood (ACCU-CHEK GUIDE) test strip, Check blood sugar every morning fasting, Disp: 300 each, Rfl: 3   insulin glargine (LANTUS SOLOSTAR) 100 UNIT/ML Solostar Pen, ADMINISTER 10 UNITS UNDER THE SKIN AT BEDTIME, Disp: 45 mL, Rfl: 4   Insulin Pen Needle (BD PEN NEEDLE NANO 2ND GEN) 32G X 4 MM MISC, Use for injection as prescribed, Disp: 200 each, Rfl: 1  Lancets (FREESTYLE) lancets, USE TO CHECK BLOOD SUGAR FOUR TIMES DAILY- BEFORE MEALS AND EVERY NIGHT AT BEDTIME, Disp: 100 each, Rfl: 5   metFORMIN (GLUCOPHAGE) 500 MG tablet, Take 1 tablet (500 mg total) by mouth 2 (two) times daily with a meal., Disp: 180 tablet, Rfl: 3   mirabegron ER (MYRBETRIQ) 50 MG TB24 tablet, Take 1 tablet (50 mg total) by mouth daily., Disp: 90 tablet, Rfl: 3    nitroGLYCERIN (NITROSTAT) 0.3 MG SL tablet, Place 1 tablet (0.3 mg total) under the tongue every 5 (five) minutes as needed for chest pain., Disp: 30 tablet, Rfl: 1   polyethylene glycol powder (GLYCOLAX) 17 GM/SCOOP powder, Mix one package (or dose) in full glass of liquid to dissolve .  Drink one dose each morning., Disp: 116 g, Rfl: 1   rosuvastatin (CRESTOR) 20 MG tablet, Take 1 tablet (20 mg total) by mouth daily., Disp: 90 tablet, Rfl: 3  Functional Status: Continues to live alone and is very independent and active in her home and in the community.  Objective BP (!) 141/64 (BP Location: Right Arm, Patient Position: Sitting, Cuff Size: Small)   Pulse 76   Temp 98.1 F (36.7 C) (Oral)   Ht 4\' 11"  (1.499 m)   Wt 97 lb (44 kg)   SpO2 100%   BMI 19.59 kg/m   Exam: Well-appearing today- more energy, quick to smile and to discuss her holiday preparations. Good color.  Neck no carotid bruits.  Heart RRR, no significant murmur.  Skin turgor poor.  Gait steady without assistance.  Assessment and Plan:  Controlled diabetes mellitus type 2 with complications (HCC) All fasting CBGs within normal range on meter download, though she has been skipping some evening insulin due to concern about lows; didn't take insulin last night and today's fasting was 120s.  Will decrease insulin from 18 units to 10 units nightly.  Recheck A1c 34M. Her goal is to come off insulin. We can discuss increasing metformin once her GI symptoms are further evaluated.  Essential hypertension 141/64 on Azor-amlodipine10/olmesartan 20 daily.  Acceptable control, tolerated well. Refilled med.  Unexplained weight loss Has lost 2 additional # x 6wks, now 97#; BMI 19.6.  CTA abdomen confirms atherosclerosis of mesenteric vessels though distal perfusion is reconstituted.  Pancreatic cyst haven't changed over time and have benign radiographic appearance.  Bowel without thickening to suggest colitis.  She has been eating, but  appetite remains poor and she frequently doesn't finish a meal.  No nausea or abdominal pain with eating. Constipation is better controlled but still not experiencing complete emptying.  Next step is GI evaluation with Dr. Leone Payor 01/2023.  She remains hesitant to have a colonoscopy and want to discuss further.   Chronic kidney disease, stage 3b (HCC) eGFR 11/11/22 had decreased to stage 3b.  She is underhydrated on exam today.  Will recheck bmp and urine ACR at next visit. Stenosis of each renal artery is noted, with distal perfusion maintained.  Will continue to monitor empagliflozin and metformin for appropriateness with regard to renal fxn.     Return in about 3 months (around 03/23/2023) for chronic condition monitoring, symptom re-check.

## 2022-12-23 NOTE — Assessment & Plan Note (Signed)
141/64 on Azor-amlodipine 10/olmesartan 20.  Acceptable control, tolerated well. Refilled med.

## 2022-12-23 NOTE — Assessment & Plan Note (Signed)
All fasting CBGs within normal range on meter download, though she has been skipping some evening insulin due to concern about lows; didn't take insulin last night and today's fasting was 120s.  Will decrease insulin grom 18 units to 10 units nightly.  Recheck A1c 62M. Her goal is to come off insulin. We can discuss increasing metformin once her GI symptoms are further evaluated.

## 2023-02-09 ENCOUNTER — Ambulatory Visit (INDEPENDENT_AMBULATORY_CARE_PROVIDER_SITE_OTHER): Payer: 59 | Admitting: Internal Medicine

## 2023-02-09 ENCOUNTER — Encounter: Payer: Self-pay | Admitting: Internal Medicine

## 2023-02-09 VITALS — BP 142/60 | HR 72 | Ht 59.0 in | Wt 97.5 lb

## 2023-02-09 DIAGNOSIS — K649 Unspecified hemorrhoids: Secondary | ICD-10-CM

## 2023-02-09 DIAGNOSIS — K862 Cyst of pancreas: Secondary | ICD-10-CM

## 2023-02-09 DIAGNOSIS — R634 Abnormal weight loss: Secondary | ICD-10-CM

## 2023-02-09 DIAGNOSIS — Z7902 Long term (current) use of antithrombotics/antiplatelets: Secondary | ICD-10-CM

## 2023-02-09 DIAGNOSIS — R195 Other fecal abnormalities: Secondary | ICD-10-CM

## 2023-02-09 DIAGNOSIS — R6881 Early satiety: Secondary | ICD-10-CM | POA: Diagnosis not present

## 2023-02-09 NOTE — Progress Notes (Signed)
[name] 83 y.o. 1940/05/09 [id number]  Assessment & Plan:   Encounter Diagnoses  Name Primary?   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  It has been a chronic recurrent issue.  It looks like her weights have stabilized somewhat lately but she clearly has lost weight over time and is down about 20 pounds since visiting me in 2021.  I wonder about the possibility of pancreatic exocrine insufficiency given her pancreatic abnormality so we will check a fecal elastase.  She has early satiety which raises a question of gastric abnormality including  malignancy, I offered an upper endoscopy but she declined.  Optimally would hold clopidogrel prior to any endoscopic procedures but I do not think it is absolutely necessary to do so for an EGD, I would favor that for a colonoscopy however.  I believe clopidogrel as prescribed by vascular surgery she has been followed there with peripheral vascular disease for many years.  Colonoscopy is considered but I think would be less helpful.  I suspect the blood she has seen is from hemorrhoids, mucus production is of unclear etiology usually benign.  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  She does not have the typical postprandial abdominal pain that would go along with mesenteric ischemia and no sitophobia either.  Orders Placed This Encounter  Procedures   Pancreatic elastase, fecal   CC: Miguel Aschoff, MD     Subjective:   Chief Complaint: Weight loss  HPI 35 year old white woman presents with a female friend, at the request of Dr. Mayford Knife because of weight loss.  I had seen her in 2021 with similar problems.  03/25/2019 HPI "This is an 83 year old white woman with unintentional 30 pound weight loss  over several months.  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  At that point she had a CT scan on October 14, 2018, with an atrophic pancreas, 14 mm septated cyst or 2 adjacent cystic lesions in the neck of the pancreas not well characterized.  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  MRI on a nonemergent basis was recommended.  There was also a tiny liver lesion.  Her appetite is off.  She also has atherosclerosis of the aorta and mesenteric branches with suspected stenosis of the SMA but she has no postprandial pain or sitophobia.  Chronic kidney disease is noted.  Hemoglobin A1c is not normal but was 7.69 days ago.  Also has calcium level 10.7.  No albumin with that.  Remotely known to me from colonoscopy in 2008.  No bowel habit changes.  3 subcentimeter adenomas then.   I will says she has been constipated recently but that resolved with changes in her psoriasis medications.   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 Initial follow-up MRI abdomen with/without contrast is suggested in 1 year, if clinically warranted  given the patient's age. This recommendation follows ACR consensus guidelines: Management of Incidental Pancreatic Cysts: A White Paper of the ACR Incidental Findings Committee. J Am Coll Radiol 2017;14:911-923.   8 mm lesion in the left lower pole, indeterminate, without convincing enhancement on the current MR. This is unlikely to be clinically significant in this patient. If follow-up is performed, this could be re-evaluated at that  time.  ------------------------------------------------------------------------------- Because of persistent weight loss Dr. Mayford Knife ordered a CT a as below:  12/08/22 CT-A IMPRESSION: VASCULAR   1. No convincing evidence of chronic mesenteric ischemia. There is moderate SMA and mild celiac ostial stenosis secondary to calcified atherosclerotic plaque, otherwise the visceral branches are widely patent. 2. Severe bilateral renal artery ostial stenoses. 3. Severe aortoiliac aortic atherosclerosis (ICD10-I70.0) with coral reef type morphology about the bifurcation resulting in occlusion of the left common iliac artery.   NON-VASCULAR   1. No acute abdominopelvic abnormality. 2. Stable appearing pancreatic cystic lesions, favored represent side branch IPMNs. -------------------------------------------------------------------------------------  As far as current symptoms she reports changes in bowel habits, with stools sometimes appearing green, which she attributes to her diet. She has daily bowel movements, facilitated by the regular use of Miralax. She has a history of constipation, which was reportedly severe years ago, but has since improved.  There does not seem to be signs or symptoms of stearrhea.  The patient also reports a decrease in appetite, feeling full faster than she used to. She has been experiencing some indigestion, which was previously managed with PPI medication, but this was stopped on the advice of her doctor due to potential long-term side effects. She now manages any episodes of indigestion with over-the-counter remedies.  The patient has a history of mucus and occasional blood in her stools, which she attributes to straining during bowel movements. This was reportedly a frequent occurrence in the past, but has not been an issue recently. She is aware that she is on a blood thinner, but does not recall who prescribed it.  Dr. Mayford Knife is expressed concern about the  possibility of memory disturbance.  Wt Readings from Last 3 Encounters:  02/09/23 97 lb 8 oz (44.2 kg)  12/23/22 97 lb (44 kg)  11/11/22 99 lb 3.2 oz (45 kg)  2021 117#      Allergies  Allergen Reactions   Valsartan Cough   Current Meds  Medication Sig   alendronate (FOSAMAX) 70 MG tablet Take 1 tablet (70 mg total) by mouth every 7 (seven) days. Take with a full glass of water on an empty stomach. For bone health.   amlodipine-olmesartan (AZOR) 10-20 MG tablet Take 1 tablet by mouth daily.   Apremilast (OTEZLA) 30 MG TABS Take 1 tablet by mouth in the morning and at bedtime.   aspirin EC 81 MG tablet Take 1 tablet (81 mg total) by mouth daily.   augmented betamethasone dipropionate (DIPROLENE-AF) 0.05 % cream Apply topically 2 (two) times daily.   Blood Glucose Monitoring Suppl (ACCU-CHEK GUIDE ME) w/Device KIT 1 each by Does not apply route 3 (three) times daily.   Cholecalciferol (VITAMIN D3) 50 MCG (2000 UT) TABS Take 1 tablet (50 mcg total) by mouth daily.   clopidogrel (PLAVIX) 75 MG tablet TAKE 1 TABLET(75 MG) BY MOUTH DAILY   Continuous Glucose Receiver (FREESTYLE LIBRE 3 READER) DEVI Use as directed   Continuous Glucose Sensor (FREESTYLE LIBRE 3 SENSOR) MISC Place new sensor every 14 days. Use to monitor blood sugar continuously.   empagliflozin ([name]) 25  MG TABS tablet Take 1 tablet (25 mg total) by mouth daily before breakfast.   glucose blood (ACCU-CHEK GUIDE) test strip Check blood sugar every morning fasting   insulin glargine (LANTUS SOLOSTAR) 100 UNIT/ML Solostar Pen ADMINISTER 10 UNITS UNDER THE SKIN AT BEDTIME   Insulin Pen Needle (BD PEN NEEDLE NANO 2ND GEN) 32G X 4 MM MISC Use for injection as prescribed   Lancets (FREESTYLE) lancets USE TO CHECK BLOOD SUGAR FOUR TIMES DAILY- BEFORE MEALS AND EVERY NIGHT AT BEDTIME   metFORMIN (GLUCOPHAGE) 500 MG tablet Take 1 tablet (500 mg total) by mouth 2 (two) times daily with a meal.   mirabegron ER (MYRBETRIQ) 50 MG  TB24 tablet Take 1 tablet (50 mg total) by mouth daily.   nitroGLYCERIN (NITROSTAT) 0.3 MG SL tablet Place 1 tablet (0.3 mg total) under the tongue every 5 (five) minutes as needed for chest pain.   polyethylene glycol powder (GLYCOLAX) 17 GM/SCOOP powder Mix one package (or dose) in full glass of liquid to dissolve .  Drink one dose each morning.   rosuvastatin (CRESTOR) 20 MG tablet Take 1 tablet (20 mg total) by mouth daily.   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/14/2004   Controlled type 2 diabetes mellitus with stage 3 chronic kidney disease, without long-term current use of insulin (HCC) 01/14/2004   De Quervain's tenosynovitis, left 11/13/2015   Diabetes (HCC)    Diverticulosis    DIVERTICULOSIS OF COLON 04/01/2007   Qualifier: Diagnosis of  By: Candice Camp CMA (AAMA), [name]     DM (diabetes mellitus) (HCC)    insulin dependent   Dyslipidemia 10/15/2005   Qualifier: Diagnosis of  By: Darrick Huntsman MD, Rosey Bath     High blood pressure    History of colonic polyps    HLD (hyperlipidemia)    HTN (hypertension)    Lumbar spinal stenosis    MI (myocardial infarction) (HCC) 2003   . [facility]   Mouth ulcer adjacent to jaw bone 08/22/2010   S/p removal by ENT 09/2010    MYOCARDIAL INFARCTION, HX OF 09/09/2006   Qualifier: Diagnosis of  By: Meredith Pel MD, Dorene Sorrow     Nephrolithiasis    hx of   NEPHROLITHIASIS 04/01/2007   Qualifier: Diagnosis of  By:  Candice Camp CMA (AAMA), [name]     OA (osteoarthritis)    Osteopenia 2010   T score of -1.8   Pancreatic cysts, - look benign by MR 04/25/2019   Personal history of colon polyps, unspecified 12/29/2012            Psoriasis    PVD (peripheral vascular disease) (HCC)    ABI indicated moderated reduction arterial flow on the left.   Tricuspid regurgitation 2009   moderate to severe, EF 55%   Past Surgical History:  Procedure Laterality Date   BYPASS GRAFT ANGIOGRAPHY     CATARACT EXTRACTION  2008   left eye   COLONOSCOPY  2008   CORONARY ANGIOPLASTY WITH STENT PLACEMENT  2003   CORONARY ARTERY BYPASS GRAFT  2006   4 vessel   LAPAROSCOPIC SUPRACERVICAL HYSTERECTOMY     PERIPHERAL VASCULAR CATHETERIZATION N/A 07/03/2014   Procedure: Abdominal Aortogram w/Lower Extremity;  Surgeon: Chuck Hint, MD;  Location: [facility] INVASIVE CV LAB;  Service: Cardiovascular;  Laterality: N/A;   Social History   Social History Narrative   Retired Conservation officer, nature   Widow/Widower   lives in [place]   4 kids (2 sons, 2 daughters) one son died many years ago in car accident.  other kids are all in [place].   Has friend that helps take care of her   family history includes Breast cancer in her daughter and sister; Cirrhosis in her brother; Heart attack in her brother; Heart disease in her brother; Hyperlipidemia in her mother and son; Hypertension in her mother and son.   Review of Systems As per HPI.  Objective:   Physical Exam @BP  (!) 142/60 (BP Location: Left Arm, Patient Position: Sitting, Cuff Size: Normal)   Pulse 72   Ht 4\' 11"  (1.499 m)   Wt 97 lb 8 oz (44.2 kg)   BMI 19.69 kg/m @  General:  Thin elderly ww NAD Eyes:  anicteric. Lungs: Clear to auscultation bilaterally. Heart:   S1S2, no rubs, murmurs, gallops. Abdomen:  soft, non-tender, no hepatosplenomegaly, hernia, or mass and BS+. ++ periumbilical bruits Rectal:  Patti [place], CMA present.   Perianal  - erythematous w/ some hypertrophy over coccyx (psoriasis) + sl prolapsed hemorrhoids Scanty stool   Lymph:  no cervical or supraclavicular adenopathy. Extremities:   no edema, cyanosis or clubbing Skin   no rash. Neuro:  A&O x 3.  Psych:  appropriate mood and  Affect.   Data Reviewed: See HPI

## 2023-02-09 NOTE — Patient Instructions (Signed)
Your provider has requested that you go to the basement level for lab work before leaving today. Press "B" on the elevator. The lab is located at the first door on the left as you exit the elevator.  Due to recent changes in healthcare laws, you may see the results of your imaging and laboratory studies on MyChart before your provider has had a chance to review them.  We understand that in some cases there may be results that are confusing or concerning to you. Not all laboratory results come back in the same time frame and the provider may be waiting for multiple results in order to interpret others.  Please give [place] 48 hours in order for your provider to thoroughly review all the results before contacting the office for clarification of your results.   I appreciate the opportunity to care for you. Stan Head, MD, [facility]

## 2023-02-11 ENCOUNTER — Other Ambulatory Visit: Payer: 59

## 2023-02-11 DIAGNOSIS — K862 Cyst of pancreas: Secondary | ICD-10-CM | POA: Diagnosis not present

## 2023-02-11 DIAGNOSIS — R634 Abnormal weight loss: Secondary | ICD-10-CM

## 2023-02-18 ENCOUNTER — Telehealth: Payer: Self-pay | Admitting: Internal Medicine

## 2023-02-18 LAB — PANCREATIC ELASTASE, FECAL: Pancreatic Elastase-1, Stool: 145 ug/g — ABNORMAL LOW (ref >200)

## 2023-02-18 MED ORDER — PANCRELIPASE (LIP-PROT-AMYL) 36000-114000 UNITS PO CPEP: 72000.0000 [IU] | ORAL_CAPSULE | Freq: Three times a day (TID) | ORAL | 3 refills | Status: DC

## 2023-02-18 NOTE — Telephone Encounter (Signed)
 I spoke with [name] and informed her of the results and plans. Confirmed the drug store and sent in the creon . Booked her an April appointment with Dr Willy Harvest.

## 2023-02-18 NOTE — Telephone Encounter (Signed)
 Please explain that the stool tests suggests her pancreas is not working normally  I have pended a Creon  Rx - please send that once opu have explained to her or/ and daughter  She will need a next available f/u about 2 months, call back sooner prn worsening problems

## 2023-04-15 ENCOUNTER — Encounter: Payer: Self-pay | Admitting: Internal Medicine

## 2023-04-15 ENCOUNTER — Ambulatory Visit: Payer: 59 | Admitting: Internal Medicine

## 2023-04-15 VITALS — BP 122/64 | HR 82 | Ht 58.5 in | Wt 101.1 lb

## 2023-04-15 DIAGNOSIS — K8689 Other specified diseases of pancreas: Secondary | ICD-10-CM

## 2023-04-15 DIAGNOSIS — K862 Cyst of pancreas: Secondary | ICD-10-CM

## 2023-04-15 DIAGNOSIS — R634 Abnormal weight loss: Secondary | ICD-10-CM

## 2023-04-15 MED ORDER — PANCRELIPASE (LIP-PROT-AMYL) 36000-114000 UNITS PO CPEP: ORAL_CAPSULE | ORAL | 11 refills | Status: DC

## 2023-04-15 NOTE — Patient Instructions (Signed)
 Glad you are doing better on the creon.   We have sent the following medications to your pharmacy for you to pick up at your convenience: Creon  _______________________________________________________  If your blood pressure at your visit was 140/90 or greater, please contact your primary care physician to follow up on this.  _______________________________________________________  If you are age 85 or older, your body mass index should be between 23-30. Your Body mass index is 20.78 kg/m. If this is out of the aforementioned range listed, please consider follow up with your Primary Care Provider.  If you are age 14 or younger, your body mass index should be between 19-25. Your Body mass index is 20.78 kg/m. If this is out of the aformentioned range listed, please consider follow up with your Primary Care Provider.   ________________________________________________________  The [facility] providers would like to encourage you to use [facility] to communicate with providers for non-urgent requests or questions.  Due to long hold times on the telephone, sending your provider a message by [facility] may be a faster and more efficient way to get a response.  Please allow 48 business hours for a response.  Please remember that this is for non-urgent requests.  _______________________________________________________  I appreciate the opportunity to care for you. Stan Head, MD, [facility]

## 2023-04-15 NOTE — Progress Notes (Signed)
 [name] 83 y.o. 1940/04/03 [id number]  Assessment & Plan:   Encounter Diagnoses  Name Primary?   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  She will continue Creon I have modified the prescription to include snacks as below.  At her age and with the findings on CT I do not plan on repeat imaging unless there is clinical deterioration.  Meds ordered this encounter  Medications   lipase/protease/amylase (CREON) 36000 UNITS CPEP capsule    Sig: Take 2 capsules (72,000 Units total) by mouth 3 (three) times daily with meals AND 1 capsule (36,000 Units total) with snacks.    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  Creon was prescribed, 72,000 units with each meal.  She reports improvement in consistency of stools and she is no longer leaking mucus.  Bleeding from hemorrhoids has stopped.  Weight is up a several pounds as reflected below.  She is currently taking it just with meals, she does snack at night sometimes.   Wt Readings from Last 3 Encounters:  04/15/23 101 lb 2 oz (45.9 kg)  02/09/23 97 lb 8 oz (44.2 kg)  12/23/22 97 lb (44 kg)     Allergies  Allergen Reactions   Valsartan Cough   Current Meds  Medication Sig   alendronate (FOSAMAX) 70 MG tablet Take 1 tablet (70 mg total) by mouth every 7 (seven) days. Take with a full glass of water on an empty stomach. For bone health.   amlodipine-olmesartan (AZOR) 10-20 MG tablet Take 1 tablet by mouth daily.   Apremilast (OTEZLA) 30 MG  TABS Take 1 tablet by mouth in the morning and at bedtime.   aspirin EC 81 MG tablet Take 1 tablet (81 mg total) by mouth daily.   augmented betamethasone dipropionate (DIPROLENE-AF) 0.05 % cream Apply topically 2 (two) times daily.   Blood Glucose Monitoring Suppl (ACCU-CHEK GUIDE ME) w/Device KIT 1 each by Does not apply route 3 (three) times daily.   clopidogrel (PLAVIX) 75 MG tablet TAKE 1 TABLET(75 MG) BY MOUTH DAILY   Continuous Glucose Receiver (FREESTYLE LIBRE 3 READER) DEVI Use as directed   Continuous Glucose Sensor (FREESTYLE LIBRE 3 SENSOR) MISC Place new sensor every 14 days. Use to monitor blood sugar continuously.   empagliflozin ([name]) 25 MG TABS tablet Take 1 tablet (25 mg total) by mouth daily before breakfast.   glucose blood (ACCU-CHEK GUIDE) test strip Check blood sugar every morning fasting   insulin glargine (LANTUS SOLOSTAR) 100 UNIT/ML Solostar Pen ADMINISTER 10 UNITS UNDER THE SKIN AT BEDTIME   Insulin Pen Needle (BD PEN NEEDLE NANO 2ND GEN) 32G X 4 MM MISC Use for injection as prescribed   Lancets (FREESTYLE) lancets USE TO CHECK BLOOD SUGAR FOUR TIMES DAILY- BEFORE MEALS AND EVERY NIGHT AT BEDTIME   metFORMIN (GLUCOPHAGE) 500 MG tablet Take 1 tablet (500 mg total) by mouth 2 (two) times daily with a meal.   mirabegron ER (MYRBETRIQ) 50 MG TB24 tablet Take 1 tablet (50  mg total) by mouth daily.   nitroGLYCERIN (NITROSTAT) 0.3 MG SL tablet Place 1 tablet (0.3 mg total) under the tongue every 5 (five) minutes as needed for chest pain.   polyethylene glycol powder (GLYCOLAX) 17 GM/SCOOP powder Mix one package (or dose) in full glass of liquid to dissolve .  Drink one dose each morning.   rosuvastatin (CRESTOR) 20 MG tablet Take 1 tablet (20 mg total) by mouth daily.   [DISCONTINUED] lipase/protease/amylase (CREON) 36000 UNITS CPEP capsule Take 2 capsules (72,000 Units total) by mouth 3 (three) times daily with meals.   Past Medical History:  Diagnosis Date   Acute  colitis 10/14/2018   Bloody diarrhea 10/13/2018   CAD (coronary artery disease) 2006   s/p Quadrupal CABG 2006   CHF (congestive heart failure) (HCC) 2010   EF 30-35% from Echo 06/2008   Chronic kidney disease, stage 3a (HCC) 09/27/2013   Chronic kidney disease, stage 3b (HCC) 09/27/2013   CKD (chronic kidney disease) stage 4, GFR 15-29 ml/min (HCC)    fluctuating between stage 3 and 4 depending on GFR   CKD (chronic kidney disease), stage III (HCC) 09/27/2013   Controlled diabetes mellitus type 2 with complications (HCC) 01/14/2004   Controlled type 2 diabetes mellitus with stage 3 chronic kidney disease, without long-term current use of insulin (HCC) 01/14/2004   De Quervain's tenosynovitis, left 11/13/2015   Diabetes (HCC)    Diverticulosis    DIVERTICULOSIS OF COLON 04/01/2007   Qualifier: Diagnosis of  By: Candice Camp CMA (AAMA), [name]     DM (diabetes mellitus) (HCC)    insulin dependent   Dyslipidemia 10/15/2005   Qualifier: Diagnosis of  By: Darrick Huntsman MD, Rosey Bath     High blood pressure    History of colonic polyps    HLD (hyperlipidemia)    HTN (hypertension)    Lumbar spinal stenosis    MI (myocardial infarction) (HCC) 2003   . [place]   Mouth ulcer adjacent to jaw bone 08/22/2010   S/p removal by ENT 09/2010    MYOCARDIAL INFARCTION, HX OF 09/09/2006   Qualifier: Diagnosis of  By: Meredith Pel MD, Dorene Sorrow     Nephrolithiasis    hx of   NEPHROLITHIASIS 04/01/2007   Qualifier: Diagnosis of  By: Candice Camp CMA (AAMA), [name]     OA (osteoarthritis)    Osteopenia 2010   T score of -1.8   Pancreatic cysts, - look benign by MR 04/25/2019   Personal history of colon polyps, unspecified 12/29/2012            Psoriasis    PVD (peripheral vascular disease) (HCC)    ABI indicated moderated reduction arterial flow on the left.   Tricuspid regurgitation 2009   moderate to severe, EF 55%   Past Surgical History:  Procedure Laterality Date   BYPASS GRAFT ANGIOGRAPHY      CATARACT EXTRACTION  2008   left eye   COLONOSCOPY  2008   CORONARY ANGIOPLASTY WITH STENT PLACEMENT  2003   CORONARY ARTERY BYPASS GRAFT  2006   4 vessel   LAPAROSCOPIC SUPRACERVICAL HYSTERECTOMY     PERIPHERAL VASCULAR CATHETERIZATION N/A 07/03/2014   Procedure: Abdominal Aortogram w/Lower Extremity;  Surgeon: Chuck Hint, MD;  Location: [facility] INVASIVE CV LAB;  Service: Cardiovascular;  Laterality: N/A;   Social History   Social History Narrative   Retired Conservation officer, nature   Widow/Widower   lives in [place]   4 kids (2 sons, 2 daughters) one son died many years  ago in car accident.  other kids are all in [place].   Has friend that helps take care of her   family history includes Breast cancer in her daughter and sister; Cirrhosis in her brother; Heart attack in her brother; Heart disease in her brother; Hyperlipidemia in her mother and son; Hypertension in her mother and son.   Review of Systems As per HPI  Objective:   Physical Exam BP 122/64   Pulse 82   Ht 4' 10.5" (1.486 m) Comment: measured in office today  Wt 101 lb 2 oz (45.9 kg)   SpO2 97%   BMI 20.78 kg/m

## 2023-04-29 ENCOUNTER — Other Ambulatory Visit: Payer: Self-pay | Admitting: Internal Medicine

## 2023-04-29 DIAGNOSIS — Z1231 Encounter for screening mammogram for malignant neoplasm of breast: Secondary | ICD-10-CM

## 2023-04-30 ENCOUNTER — Encounter (HOSPITAL_COMMUNITY): Payer: 59

## 2023-04-30 ENCOUNTER — Ambulatory Visit: Payer: 59

## 2023-05-05 ENCOUNTER — Ambulatory Visit: Admitting: Podiatry

## 2023-05-05 ENCOUNTER — Other Ambulatory Visit: Payer: Self-pay

## 2023-05-05 DIAGNOSIS — E1122 Type 2 diabetes mellitus with diabetic chronic kidney disease: Secondary | ICD-10-CM

## 2023-05-05 DIAGNOSIS — Z794 Long term (current) use of insulin: Secondary | ICD-10-CM

## 2023-05-05 MED ORDER — EMPAGLIFLOZIN 25 MG PO TABS: 25.0000 mg | ORAL_TABLET | Freq: Every day | ORAL | 3 refills | Status: AC

## 2023-05-05 NOTE — Telephone Encounter (Signed)
 Medication sent to pharmacy

## 2023-05-28 ENCOUNTER — Ambulatory Visit (HOSPITAL_BASED_OUTPATIENT_CLINIC_OR_DEPARTMENT_OTHER)
Admission: RE | Admit: 2023-05-28 | Discharge: 2023-05-28 | Disposition: A | Discharge status: Home / Self Care | Source: Ambulatory Visit | Attending: Vascular Surgery | Admitting: Vascular Surgery

## 2023-05-28 ENCOUNTER — Ambulatory Visit (INDEPENDENT_AMBULATORY_CARE_PROVIDER_SITE_OTHER): Admitting: Physician Assistant

## 2023-05-28 ENCOUNTER — Ambulatory Visit (HOSPITAL_COMMUNITY)
Admission: RE | Admit: 2023-05-28 | Discharge: 2023-05-28 | Disposition: A | Discharge status: Home / Self Care | Source: Ambulatory Visit | Attending: Vascular Surgery | Admitting: Vascular Surgery

## 2023-05-28 VITALS — BP 129/73 | HR 93 | Temp 98.2°F | Ht <= 58 in | Wt 99.8 lb

## 2023-05-28 DIAGNOSIS — I739 Peripheral vascular disease, unspecified: Secondary | ICD-10-CM

## 2023-05-28 DIAGNOSIS — I7409 Other arterial embolism and thrombosis of abdominal aorta: Secondary | ICD-10-CM | POA: Diagnosis not present

## 2023-05-28 DIAGNOSIS — I70219 Atherosclerosis of native arteries of extremities with intermittent claudication, unspecified extremity: Secondary | ICD-10-CM | POA: Diagnosis not present

## 2023-05-28 DIAGNOSIS — I6523 Occlusion and stenosis of bilateral carotid arteries: Secondary | ICD-10-CM

## 2023-05-28 LAB — VAS US ABI WITH/WO TBI
Left ABI: 0.55
Right ABI: 0.56

## 2023-05-28 NOTE — Progress Notes (Signed)
 Office Note     CC:  follow up Requesting Provider:  Sherol Dixie, MD  HPI: [name] is a 83 y.o. (Jun 10, 1940) female who presents for follow up of PAD and Carotid artery stenosis. She has history of left common iliac artery stenting in June of 2016 by Dr. Shaunna [place]. This stent unfortunately has since occluded. She has been with limited symptoms since. She also has 40-59% carotid artery stenosis that we have been following. No history of stroke or TIA.  She is here with her husband today. She reports overall doing well. No major concerns. She did recently injure the back of her right leg on a chair at a restaurant. She still has a knot and bruising present but it is improving. She has bilateral calf claudication that overall is stable. She gets discomfort in her groins as well on ambulation at the same time the pain in calves occurs. She feels both legs are about the same. Improved with rest. She denies any pain at rest. No tissue loss. She does get cramps in her feet at night time. This is usually worse if she has been more active or up on her feet more that day. She also says that her feet get very sore if she stands on them for prolonged periods of time so she has to rest often. She feels that she drinks plenty of water and denies any swelling in her legs. She does not elevate her legs at all.  She denies any visual changes, slurred speech or facial drooping. No unilateral upper or lower extremity weakness or numbness. She is medically managed on Aspirin , statin and Plavix .     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/14/2004   Controlled type 2 diabetes mellitus with stage 3 chronic kidney disease, without long-term current use of insulin  (HCC) 01/14/2004   De Quervain's tenosynovitis, left 11/13/2015   Diabetes (HCC)    Diverticulosis    DIVERTICULOSIS OF COLON 04/01/2007   Qualifier: Diagnosis of  By: Misty Amour CMA (AAMA), [name]     DM (diabetes mellitus) (HCC)    insulin  dependent   Dyslipidemia 10/15/2005   Qualifier: Diagnosis of  By: Madelon Scheuermann MD, Ammon Bales     High blood pressure    History of colonic polyps    HLD (hyperlipidemia)    HTN (hypertension)    Lumbar spinal stenosis    MI (myocardial infarction) (HCC) 2003   . [place]   Mouth ulcer adjacent to jaw bone 08/22/2010   S/p removal by ENT 09/2010    MYOCARDIAL INFARCTION, HX OF 09/09/2006   Qualifier: Diagnosis of  By: Latrelle Pole MD, Josefina Nian     Nephrolithiasis    hx of   NEPHROLITHIASIS 04/01/2007   Qualifier: Diagnosis of  By: Misty Amour CMA (AAMA), [name]     OA (osteoarthritis)    Osteopenia 2010   T score of -1.8   Pancreatic cysts, -  look benign by MR 04/25/2019   Personal history of colon polyps, unspecified 12/29/2012            Psoriasis    PVD (peripheral vascular disease) (HCC)    ABI indicated moderated reduction arterial flow on the left.   Tricuspid regurgitation 2009   moderate to severe, EF 55%    Past Surgical History:  Procedure Laterality Date   BYPASS GRAFT ANGIOGRAPHY     CATARACT EXTRACTION  2008   left eye   COLONOSCOPY  2008   CORONARY ANGIOPLASTY WITH STENT PLACEMENT  2003   CORONARY ARTERY BYPASS GRAFT  2006   4 vessel   LAPAROSCOPIC SUPRACERVICAL HYSTERECTOMY     PERIPHERAL VASCULAR CATHETERIZATION N/A 07/03/2014   Procedure: Abdominal Aortogram w/Lower Extremity;  Surgeon: Dannis Dy, MD;  Location:  [facility] INVASIVE CV LAB;  Service: Cardiovascular;  Laterality: N/A;    Social History   Socioeconomic History   Marital status: Widowed    Spouse name: Not on file   Number of children: Not on file   Years of education: Not on file   Highest education level: Not on file  Occupational History   Not on file  Tobacco Use   Smoking status: Never   Smokeless tobacco: Never  Vaping Use   Vaping status: Never Used  Substance and Sexual Activity   Alcohol use: No    Alcohol/week: 0.0 standard drinks of alcohol   Drug use: Never   Sexual activity: Not on file  Other Topics Concern   Not on file  Social History Narrative   Retired Conservation officer, nature   Widow/Widower   lives in [place]   4 kids (2 sons, 2 daughters) one son died many years ago in car accident.  other kids are all in [place].   Has friend that helps take care of her   Social Drivers of Health   Financial Resource Strain: Low Risk  (08/26/2022)   Overall Financial Resource Strain (CARDIA)    Difficulty of Paying Living Expenses: Not hard at all  Food Insecurity: No Food Insecurity (08/26/2022)   Hunger Vital Sign    Worried About Running Out of Food in the Last Year: Never true    Ran Out of Food in the Last Year: Never true  Transportation Needs: No Transportation Needs (08/26/2022)   PRAPARE - Administrator, Civil Service (Medical): No    Lack of Transportation (Non-Medical): No  Physical Activity: Patient Unable To Answer (08/26/2022)   Exercise Vital Sign    Days of Exercise per Week: Patient unable to answer    Minutes of Exercise per Session: Patient unable to answer  Stress: No Stress Concern Present (08/26/2022)   [employer] of Occupational Health - Occupational Stress Questionnaire    Feeling of Stress : Only a little  Social Connections: Moderately Integrated (08/26/2022)   Social Connection and Isolation Panel [NHANES]    Frequency of Communication with Friends and Family:  Three times a week    Frequency of Social Gatherings with Friends and Family: Not on file    Attends Religious Services: More than 4 times per year    Active Member of Clubs or Organizations: Yes    Attends Banker Meetings: 1 to 4 times per year    Marital Status: Widowed  Intimate Partner Violence: Not At Risk (08/26/2022)   Humiliation, Afraid, Rape, and Kick questionnaire    Fear of Current or Ex-Partner: No  Emotionally Abused: No    Physically Abused: No    Sexually Abused: No   Family History  Problem Relation Age of Onset   Hypertension Mother    Hyperlipidemia Mother    Breast cancer Sister    Cirrhosis Brother    Heart disease Brother    Heart attack Brother    Breast cancer Daughter        metastatic , in her bones   Hyperlipidemia Son    Hypertension Son    Liver disease Neg Hx    Esophageal cancer Neg Hx    Colon cancer Neg Hx     Current Outpatient Medications  Medication Sig Dispense Refill   alendronate  (FOSAMAX ) 70 MG tablet Take 1 tablet (70 mg total) by mouth every 7 (seven) days. Take with a full glass of water on an empty stomach. For bone health. 12 tablet 3   amlodipine -olmesartan  (AZOR ) 10-20 MG tablet Take 1 tablet by mouth daily. 90 tablet 3   Apremilast (OTEZLA) 30 MG TABS Take 1 tablet by mouth in the morning and at bedtime.     aspirin  EC 81 MG tablet Take 1 tablet (81 mg total) by mouth daily. 90 tablet 3   augmented betamethasone  dipropionate (DIPROLENE -AF) 0.05 % cream Apply topically 2 (two) times daily.     Blood Glucose Monitoring Suppl (ACCU-CHEK GUIDE ME) w/Device KIT 1 each by Does not apply route 3 (three) times daily. 1 kit 1   Cholecalciferol (VITAMIN D3) 50 MCG (2000 UT) TABS Take 1 tablet (50 mcg total) by mouth daily.     clopidogrel  (PLAVIX ) 75 MG tablet TAKE 1 TABLET(75 MG) BY MOUTH DAILY 90 tablet 3   Continuous Glucose Receiver (FREESTYLE LIBRE 3 READER) DEVI Use as directed 1 each 1   Continuous Glucose Sensor  (FREESTYLE LIBRE 3 SENSOR) MISC Place new sensor every 14 days. Use to monitor blood sugar continuously. 6 each 3   empagliflozin  ([name] ) 25 MG TABS tablet Take 1 tablet (25 mg total) by mouth daily before breakfast. 90 tablet 3   glucose blood (ACCU-CHEK GUIDE) test strip Check blood sugar every morning fasting 300 each 3   insulin  glargine (LANTUS  SOLOSTAR) 100 UNIT/ML Solostar Pen ADMINISTER 10 UNITS UNDER THE SKIN AT BEDTIME 45 mL 4   Insulin  Pen Needle (BD PEN NEEDLE NANO 2ND GEN) 32G X 4 MM MISC Use for injection as prescribed 200 each 1   Lancets (FREESTYLE) lancets USE TO CHECK BLOOD SUGAR FOUR TIMES DAILY- BEFORE MEALS AND EVERY NIGHT AT BEDTIME 100 each 5   lipase/protease/amylase (CREON ) 36000 UNITS CPEP capsule Take 2 capsules (72,000 Units total) by mouth 3 (three) times daily with meals AND 1 capsule (36,000 Units total) with snacks. 250 capsule 11   metFORMIN  (GLUCOPHAGE ) 500 MG tablet Take 1 tablet (500 mg total) by mouth 2 (two) times daily with a meal. 180 tablet 3   mirabegron  ER (MYRBETRIQ ) 50 MG TB24 tablet Take 1 tablet (50 mg total) by mouth daily. 90 tablet 3   nitroGLYCERIN  (NITROSTAT ) 0.3 MG SL tablet Place 1 tablet (0.3 mg total) under the tongue every 5 (five) minutes as needed for chest pain. 30 tablet 1   polyethylene glycol powder (GLYCOLAX ) 17 GM/SCOOP powder Mix one package (or dose) in full glass of liquid to dissolve .  Drink one dose each morning. 116 g 1   rosuvastatin  (CRESTOR ) 20 MG tablet Take 1 tablet (20 mg total) by mouth daily. 90 tablet 3   No current facility-administered  medications for this visit.    Allergies  Allergen Reactions   Valsartan  Cough     REVIEW OF SYSTEMS:  [X]  denotes positive finding, [ ]  denotes negative finding Cardiac  Comments:  Chest pain or chest pressure:    Shortness of breath upon exertion:    Short of breath when lying flat:    Irregular heart rhythm:        Vascular    Pain in calf, thigh, or hip brought on  by ambulation:    Pain in feet at night that wakes you up from your sleep:     Blood clot in your veins:    Leg swelling:         Pulmonary    Oxygen at home:    Productive cough:     Wheezing:         Neurologic    Sudden weakness in arms or legs:     Sudden numbness in arms or legs:     Sudden onset of difficulty speaking or slurred speech:    Temporary loss of vision in one eye:     Problems with dizziness:         Gastrointestinal    Blood in stool:     Vomited blood:         Genitourinary    Burning when urinating:     Blood in urine:        Psychiatric    Major depression:         Hematologic    Bleeding problems:    Problems with blood clotting too easily:        Skin    Rashes or ulcers:        Constitutional    Fever or chills:      PHYSICAL EXAMINATION:  Vitals:   05/28/23 1049 05/28/23 1052  BP: (!) 156/80 129/73  Pulse: 93   Temp: 98.2 F (36.8 C)   TempSrc: Temporal   SpO2: 98%   Weight: 99 lb 12.8 oz (45.3 kg)   Height: 4\' 10"  (1.473 m)     General:  WDWN in NAD; vital signs documented above Gait: Normal HENT: WNL, normocephalic Pulmonary: normal non-labored breathing , without  wheezing Cardiac: regular HR Abdomen: soft Vascular Exam/Pulses: 2+ radial, 2+ femoral, no palpable distal pulses. Faint doppler DP/PT signals. Feet warm and well perfused Extremities: without ischemic changes, without Gangrene , without cellulitis; without open wounds; she has multiple reticular and spider veins on BLE; posterior right leg with ecchymosis and what appears to be resolving hematoma Musculoskeletal: no muscle wasting or atrophy  Neurologic: A&O X 3 Psychiatric:  The pt has Normal affect.   Non-Invasive Vascular Imaging:   +-------+-----------+-----------+------------+------------+  ABI/TBIToday's ABIToday's TBIPrevious ABIPrevious TBI  +-------+-----------+-----------+------------+------------+  Right 0.56       0.45       0.63         0.46          +-------+-----------+-----------+------------+------------+  Left  0.55       0.42       0.45        0.32          +-------+-----------+-----------+------------+------------+   VAS US  Aorta/IVC/Iliacs: Summary:  Stenosis:  No evidence of stenosis in the right CIA or EIA. Unable to obtain higher velocities as seen on previous exam.  The left CIA and EIA remain occluded with distal EIA reconstitution.   VAS Carotid duplex: Summary:  Right Carotid: Velocities in the  right ICA are consistent with a 1-39% stenosis. Higher velocities noted on previous exam were not seen today.   Left Carotid: Velocities in the left ICA are consistent with a 40-59% stenosis. Higher velocities noted on previous exam were not seen today.   Vertebrals: Bilateral vertebral arteries demonstrate antegrade flow.  Subclavians: Normal flow hemodynamics were seen in bilateral subclavian arteries.   ASSESSMENT/PLAN:: 83 y.o. female here for follow up for PAD and Carotid artery stenosis. She has history of left common iliac artery stenting in June of 2016 by Dr. Shaunna [place]. This stent unfortunately has since occluded. She has overall stable claudication. No rest pain or tissue loss. - ABIs overall stable from prior study - Duplex shows known occluded left EIA stent. Right CIA and EIA patent - Carotid duplex shows right ICA with 1-39% stenosis. Left ICA 40-59% stenosis. Normal flow in the vertebral and subclavian arteries - encourage walking regimen to promote collaterals - continue Aspirin , statin, Plavix  -She will follow up again in 1 year with repeat carotid duplex, ABIs and Aorto/iliac duplex - she knows to follow up sooner if any new or worsening symptoms   Deneen Finical, PA-C [facility] [phone]  Clinic MD:   Rosalva Comber

## 2023-05-29 ENCOUNTER — Ambulatory Visit
Admission: RE | Admit: 2023-05-29 | Discharge: 2023-05-29 | Disposition: A | Discharge status: Home / Self Care | Source: Ambulatory Visit | Attending: Internal Medicine | Admitting: Internal Medicine

## 2023-05-29 DIAGNOSIS — Z1231 Encounter for screening mammogram for malignant neoplasm of breast: Secondary | ICD-10-CM

## 2023-06-18 ENCOUNTER — Ambulatory Visit: Payer: Self-pay | Admitting: Internal Medicine

## 2023-06-18 VITALS — BP 134/59 | HR 83 | Temp 97.9°F | Wt 102.4 lb

## 2023-06-18 DIAGNOSIS — I739 Peripheral vascular disease, unspecified: Secondary | ICD-10-CM

## 2023-06-18 DIAGNOSIS — Z7984 Long term (current) use of oral hypoglycemic drugs: Secondary | ICD-10-CM | POA: Diagnosis not present

## 2023-06-18 DIAGNOSIS — I130 Hypertensive heart and chronic kidney disease with heart failure and stage 1 through stage 4 chronic kidney disease, or unspecified chronic kidney disease: Secondary | ICD-10-CM

## 2023-06-18 DIAGNOSIS — K8681 Exocrine pancreatic insufficiency: Secondary | ICD-10-CM | POA: Diagnosis not present

## 2023-06-18 DIAGNOSIS — R1319 Other dysphagia: Secondary | ICD-10-CM

## 2023-06-18 DIAGNOSIS — E1122 Type 2 diabetes mellitus with diabetic chronic kidney disease: Secondary | ICD-10-CM

## 2023-06-18 DIAGNOSIS — N1832 Chronic kidney disease, stage 3b: Secondary | ICD-10-CM | POA: Diagnosis not present

## 2023-06-18 DIAGNOSIS — I5032 Chronic diastolic (congestive) heart failure: Secondary | ICD-10-CM

## 2023-06-18 DIAGNOSIS — E118 Type 2 diabetes mellitus with unspecified complications: Secondary | ICD-10-CM

## 2023-06-18 DIAGNOSIS — Z794 Long term (current) use of insulin: Secondary | ICD-10-CM | POA: Diagnosis not present

## 2023-06-18 DIAGNOSIS — Z87898 Personal history of other specified conditions: Secondary | ICD-10-CM | POA: Diagnosis not present

## 2023-06-18 DIAGNOSIS — I10 Essential (primary) hypertension: Secondary | ICD-10-CM

## 2023-06-18 DIAGNOSIS — N3941 Urge incontinence: Secondary | ICD-10-CM

## 2023-06-18 LAB — GLUCOSE, CAPILLARY: Glucose-Capillary: 154 mg/dL — ABNORMAL HIGH (ref 70–99)

## 2023-06-18 LAB — POCT GLYCOSYLATED HEMOGLOBIN (HGB A1C): Hemoglobin A1C: 7.1 % — AB (ref 4.0–5.6)

## 2023-06-18 NOTE — Progress Notes (Signed)
 83 y.o. [name] is here for routine follow-up of well controlled complicated T2DM, CKD3, HTN, vascular disease, and more recently GI symptoms with worrisome weight loss.  Since last visit patient has been evlauated by GI who diagnosed pancreatic insufficiency; sxs have improved and weight has stabilized on enzymes treatment.  She has also had routine f/u with vascular surgery for her PAD with severe claudication, fortunately no ulceration or rest pain.   New problem today is episodic dysphagia without vomiting, nausea, regurgitation, chest discomfort, burping or belching.  NO melena or BRBPR.  Patient Active Problem List   Diagnosis Date Noted   Memory changes 11/11/2022   Carotid stenosis, asymptomatic, bilateral 09/26/2021   Chronic heart failure with preserved ejection fraction (HCC) 04/18/2021   Urinary urgency, infrequent incontinence,, with nocturia 07/18/2020   Healthcare maintenance 12/27/2019   Chronic insomnia 05/18/2019   Pancreatic cysts, - look benign by MR 04/25/2019   Unexplained weight loss 03/17/2019   Hyperlipidemia 08/05/2018   GERD (gastroesophageal reflux disease) 01/17/2015   Chronic kidney disease, stage 3b (HCC) 09/27/2013   Localized, secondary osteoarthritis of the shoulder region 04/19/2013   Personal history of colon polyps, unspecified 12/29/2012   Mitral valve regurgitation 04/22/2010   Essential hypertension 09/09/2006   Constipation 09/09/2006   Osteoarthritis of back 12/23/2005   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/15/2005   Peripheral vascular disease (HCC) 10/15/2005   Psoriasis 10/15/2005   Age-related osteoporosis without current pathological fracture 10/15/2005   Controlled diabetes mellitus type 2 with complications (HCC) 01/14/2004    Current Outpatient Medications:    alendronate  (FOSAMAX ) 70 MG tablet, Take 1 tablet (70 mg total) by mouth every 7 (seven) days. Take with a full glass of water on an empty stomach. For  bone health., Disp: 12 tablet, Rfl: 3   amlodipine -olmesartan  (AZOR ) 10-20 MG tablet, Take 1 tablet by mouth daily., Disp: 90 tablet, Rfl: 3   Apremilast (OTEZLA) 30 MG TABS, Take 1 tablet by mouth in the morning and at bedtime., Disp: , Rfl:    aspirin  EC 81 MG tablet, Take 1 tablet (81 mg total) by mouth daily., Disp: 90 tablet, Rfl: 3   augmented betamethasone  dipropionate (DIPROLENE -AF) 0.05 % cream, Apply topically 2 (two) times daily., Disp: , Rfl:    Blood Glucose Monitoring Suppl (ACCU-CHEK GUIDE ME) w/Device KIT, 1 each by Does not apply route 3 (three) times daily., Disp: 1 kit, Rfl: 1   Cholecalciferol (VITAMIN D3) 50 MCG (2000 UT) TABS, Take 1 tablet (50 mcg total) by mouth daily., Disp: , Rfl:    clopidogrel  (PLAVIX ) 75 MG tablet, TAKE 1 TABLET(75 MG) BY MOUTH DAILY, Disp: 90 tablet, Rfl: 3   Continuous Glucose Receiver (FREESTYLE LIBRE 3 READER) DEVI, Use as directed, Disp: 1 each, Rfl: 1   Continuous Glucose Sensor (FREESTYLE LIBRE 3 SENSOR) MISC, Place new sensor every 14 days. Use to monitor blood sugar continuously., Disp: 6 each, Rfl: 3   empagliflozin  ([name] ) 25 MG TABS tablet, Take 1 tablet (25 mg total) by mouth daily before breakfast., Disp: 90 tablet, Rfl: 3   glucose blood (ACCU-CHEK GUIDE) test strip, Check blood sugar every morning fasting, Disp: 300 each, Rfl: 3   insulin  glargine (LANTUS  SOLOSTAR) 100 UNIT/ML Solostar Pen, ADMINISTER 10 UNITS UNDER THE SKIN AT BEDTIME, Disp: 45 mL, Rfl: 4   Insulin  Pen Needle (BD PEN NEEDLE NANO 2ND GEN) 32G X 4 MM MISC, Use for injection as prescribed, Disp: 200 each, Rfl: 1   Lancets (  FREESTYLE) lancets, USE TO CHECK BLOOD SUGAR FOUR TIMES DAILY- BEFORE MEALS AND EVERY NIGHT AT BEDTIME, Disp: 100 each, Rfl: 5   lipase/protease/amylase (CREON ) 36000 UNITS CPEP capsule, Take 2 capsules (72,000 Units total) by mouth 3 (three) times daily with meals AND 1 capsule (36,000 Units total) with snacks., Disp: 250 capsule, Rfl: 11   metFORMIN   (GLUCOPHAGE ) 500 MG tablet, Take 1 tablet (500 mg total) by mouth 2 (two) times daily with a meal., Disp: 180 tablet, Rfl: 3   mirabegron  ER (MYRBETRIQ ) 50 MG TB24 tablet, Take 1 tablet (50 mg total) by mouth daily., Disp: 90 tablet, Rfl: 3   nitroGLYCERIN  (NITROSTAT ) 0.3 MG SL tablet, Place 1 tablet (0.3 mg total) under the tongue every 5 (five) minutes as needed for chest pain., Disp: 30 tablet, Rfl: 1   polyethylene glycol powder (GLYCOLAX ) 17 GM/SCOOP powder, Mix one package (or dose) in full glass of liquid to dissolve .  Drink one dose each morning., Disp: 116 g, Rfl: 1   rosuvastatin  (CRESTOR ) 20 MG tablet, Take 1 tablet (20 mg total) by mouth daily., Disp: 90 tablet, Rfl: 3  Functional Status: Continues to live alone and is very independent and active in her home and in the community. Has a close man companion.  Objective BP (!) 156/63 (BP Location: Left Arm, Patient Position: Sitting, Cuff Size: Small)   Pulse 80   Temp 97.9 F (36.6 C) (Oral)   Wt 102 lb 6.4 oz (46.4 kg)   SpO2 100%   BMI 21.40 kg/m   Problems addressed today:  Controlled type 2 diabetes mellitus with complication, without long-term current use of insulin  (HCC) Assessment & Plan: A1c stable 7.1, at goal, on emapaglifloxin 25, metformin  500 bid, lantus  10 units daily (reduced from 18 last visit).  Goal is to come off insulin . Anticipate increasing metformin  now that her GI sxs have been attributed to pancreatic insufficiency, controlled with replacement enzyme.  Missed opportunity to update foot exam and UAC today - will catch next time.  Orders: -     POCT glycosylated hemoglobin (Hb A1C)  Chronic kidney disease, stage 3b (HCC) Assessment & Plan: Assess today with bmp. She is euvolemic. On empagliflozin  for renal protection. She has been stable for years. Orders: -     BMP8+Anion Gap  Urinary urgency, infrequent incontinence,, with nocturia Assessment & Plan: Stopped myrbetriq , didn't think it was  effective, made her feel funny. She is managing without medication.    Esophageal dysphagia -     DG ESOPHAGUS W SINGLE CM (SOL OR THIN BA); Future  H/O abnormal weight loss Assessment & Plan: Weight has stabilized around 100# after initiation of Creon  for pancreatic insufficiency.   Exocrine pancreatic insufficiency Assessment & Plan: Found to be the cause of her abdominal/bowel/weight loss symptoms; recently saw Dr. Avram GI, who initiated Creon  which has been effective.  He will be retiring.  Monitor.   Essential hypertension Assessment & Plan: Controlled at 134/59 on Azor  10/20. Bmp today.   Chronic heart failure with preserved ejection fraction [facility]) Assessment & Plan: Compensated, asymptomatic. No LE edema or JVD. Continue to monitor on current regimen.   Peripheral vascular disease (HCC) Assessment & Plan: Evaluated by vascular surgery for routine visit last month.  She continues to have activity-limiting pain. Managed with ASA, clopidogrel , and statin.  No ulceration. She is managing.  She'll f/u with vascular for repeat studies in a year.   Other orders -     Glucose, capillary

## 2023-06-18 NOTE — Patient Instructions (Addendum)
 [name], Ske[place] to see you today!  We talked today about your swallowing problems and will have you scheduled for a special xray test to evaluate your esophagus.    I'm glad your weight has stabilized!  I'll check your kidney function and diabetes tests today and let you know if there is anything that we need to change.  Take care and see you next time!  Dr. Broadus Canes

## 2023-06-18 NOTE — Assessment & Plan Note (Signed)
 Stopped myrbetriq , didn't think it was effective, made her feel funny. She is managing without medication.

## 2023-06-19 LAB — BMP8+ANION GAP
Anion Gap: 16 mmol/L (ref 10.0–18.0)
BUN/Creatinine Ratio: 20 (ref 12–28)
BUN: 26 mg/dL (ref 8–27)
CO2: 23 mmol/L (ref 20–29)
Calcium: 9.7 mg/dL (ref 8.7–10.3)
Chloride: 103 mmol/L (ref 96–106)
Creatinine, Ser: 1.31 mg/dL — ABNORMAL HIGH (ref 0.57–1.00)
Glucose: 153 mg/dL — ABNORMAL HIGH (ref 70–99)
Potassium: 4.6 mmol/L (ref 3.5–5.2)
Sodium: 142 mmol/L (ref 134–144)
eGFR: 40 mL/min/{1.73_m2} — ABNORMAL LOW (ref >59)

## 2023-07-15 DIAGNOSIS — L858 Other specified epidermal thickening: Secondary | ICD-10-CM | POA: Diagnosis not present

## 2023-07-15 DIAGNOSIS — Z79899 Other long term (current) drug therapy: Secondary | ICD-10-CM | POA: Diagnosis not present

## 2023-07-15 DIAGNOSIS — L400 Psoriasis vulgaris: Secondary | ICD-10-CM | POA: Diagnosis not present

## 2023-07-23 ENCOUNTER — Telehealth: Payer: Self-pay | Admitting: Internal Medicine

## 2023-07-23 DIAGNOSIS — H04123 Dry eye syndrome of bilateral lacrimal glands: Secondary | ICD-10-CM | POA: Diagnosis not present

## 2023-07-23 DIAGNOSIS — H02831 Dermatochalasis of right upper eyelid: Secondary | ICD-10-CM | POA: Diagnosis not present

## 2023-07-23 DIAGNOSIS — E119 Type 2 diabetes mellitus without complications: Secondary | ICD-10-CM | POA: Diagnosis not present

## 2023-07-23 DIAGNOSIS — Z961 Presence of intraocular lens: Secondary | ICD-10-CM | POA: Diagnosis not present

## 2023-07-23 DIAGNOSIS — H02834 Dermatochalasis of left upper eyelid: Secondary | ICD-10-CM | POA: Diagnosis not present

## 2023-07-23 DIAGNOSIS — D3131 Benign neoplasm of right choroid: Secondary | ICD-10-CM | POA: Diagnosis not present

## 2023-07-23 DIAGNOSIS — Z794 Long term (current) use of insulin: Secondary | ICD-10-CM | POA: Diagnosis not present

## 2023-07-23 DIAGNOSIS — H26493 Other secondary cataract, bilateral: Secondary | ICD-10-CM | POA: Diagnosis not present

## 2023-07-23 LAB — HM DIABETES EYE EXAM

## 2023-07-23 NOTE — Telephone Encounter (Signed)
 Inbound call from patient requesting for Creon  medication to refilled. States she has been out of medication for a week. States pharmacy has sent refill requests but has not heard anything back. Please advise, thank you.

## 2023-07-23 NOTE — Telephone Encounter (Signed)
 I called and left her a detailed message that the creon  was sent in April 2025 with 11 RF's to [facility] on [address]. I told her I haven't seen anything come thru from a pharmacy on her creon . I told her to call me back tomorrow after 9:30am and let me know if it needs to go to a different pharmacy.

## 2023-07-24 NOTE — Telephone Encounter (Signed)
 I called the pharmacy and they just needed a Max per day of capsules she would take. I called and told [name] that hopefully this gets it straightened out for her.

## 2023-09-21 DIAGNOSIS — K8681 Exocrine pancreatic insufficiency: Secondary | ICD-10-CM | POA: Insufficient documentation

## 2023-09-21 NOTE — Assessment & Plan Note (Addendum)
 Symptoms have returned, will order barium esophagram and refer to GI if lesion amenable to procedure is found.  No odynophagia or melena, hasn't been taking PPI and hasn't had noticeable reflux. Reviewed Dr. Darilyn notes from 01/2023 who remarked that earlier satiety may merit EGD but she declined at that time. She does not like procedures.

## 2023-09-21 NOTE — Assessment & Plan Note (Addendum)
 A1c stable 7.1, at goal, on emapaglifloxin 25, metformin  500 bid, lantus  10 units daily (reduced from 18 last visit).  Goal is to come off insulin . Anticipate increasing metformin  now that her GI sxs have been attributed to pancreatic insufficiency, controlled with replacement enzyme.  Missed opportunity to update foot exam and UAC today - will catch next time.

## 2023-09-21 NOTE — Assessment & Plan Note (Signed)
 Weight has stabilized around 100# after initiation of Creon  for pancreatic insufficiency.

## 2023-09-21 NOTE — Assessment & Plan Note (Signed)
 Assess today with bmp. She is euvolemic. On empagliflozin  for renal protection. She has been stable for years.

## 2023-09-21 NOTE — Assessment & Plan Note (Signed)
 Controlled at 134/59 on Azor  10/20. Bmp today.

## 2023-09-21 NOTE — Assessment & Plan Note (Signed)
Compensated, asymptomatic.  No LE edema or JVD.  Continue to monitor on current regimen.

## 2023-09-21 NOTE — Assessment & Plan Note (Signed)
 Found to be the cause of her abdominal/bowel/weight loss symptoms; recently saw Dr. Avram GI, who initiated Creon  which has been effective.  He will be retiring.  Monitor.

## 2023-09-21 NOTE — Assessment & Plan Note (Signed)
 Evaluated by vascular surgery for routine visit last month.  She continues to have activity-limiting pain. Managed with ASA, clopidogrel , and statin.  No ulceration. She is managing.  She'll f/u with vascular for repeat studies in a year.

## 2023-09-22 ENCOUNTER — Ambulatory Visit (INDEPENDENT_AMBULATORY_CARE_PROVIDER_SITE_OTHER): Admitting: Internal Medicine

## 2023-09-22 VITALS — BP 153/54 | HR 66 | Temp 97.5°F | Ht <= 58 in | Wt 101.0 lb

## 2023-09-22 DIAGNOSIS — M810 Age-related osteoporosis without current pathological fracture: Secondary | ICD-10-CM

## 2023-09-22 DIAGNOSIS — E118 Type 2 diabetes mellitus with unspecified complications: Secondary | ICD-10-CM

## 2023-09-22 DIAGNOSIS — Z7984 Long term (current) use of oral hypoglycemic drugs: Secondary | ICD-10-CM | POA: Diagnosis not present

## 2023-09-22 DIAGNOSIS — I739 Peripheral vascular disease, unspecified: Secondary | ICD-10-CM | POA: Diagnosis not present

## 2023-09-22 DIAGNOSIS — E785 Hyperlipidemia, unspecified: Secondary | ICD-10-CM | POA: Diagnosis not present

## 2023-09-22 DIAGNOSIS — N1832 Chronic kidney disease, stage 3b: Secondary | ICD-10-CM | POA: Diagnosis not present

## 2023-09-22 DIAGNOSIS — L409 Psoriasis, unspecified: Secondary | ICD-10-CM | POA: Diagnosis not present

## 2023-09-22 DIAGNOSIS — E1122 Type 2 diabetes mellitus with diabetic chronic kidney disease: Secondary | ICD-10-CM

## 2023-09-22 DIAGNOSIS — I129 Hypertensive chronic kidney disease with stage 1 through stage 4 chronic kidney disease, or unspecified chronic kidney disease: Secondary | ICD-10-CM

## 2023-09-22 DIAGNOSIS — Z951 Presence of aortocoronary bypass graft: Secondary | ICD-10-CM

## 2023-09-22 DIAGNOSIS — I10 Essential (primary) hypertension: Secondary | ICD-10-CM

## 2023-09-22 LAB — POCT GLYCOSYLATED HEMOGLOBIN (HGB A1C): HbA1c, POC (controlled diabetic range): 6.7 % (ref 0.0–7.0)

## 2023-09-22 LAB — GLUCOSE, CAPILLARY: Glucose-Capillary: 202 mg/dL — ABNORMAL HIGH (ref 70–99)

## 2023-09-22 MED ORDER — AMLODIPINE-OLMESARTAN 10-20 MG PO TABS: 1.0000 | ORAL_TABLET | Freq: Every day | ORAL | 3 refills | Status: DC

## 2023-09-22 MED ORDER — CLOPIDOGREL BISULFATE 75 MG PO TABS: ORAL_TABLET | ORAL | 3 refills | Status: DC

## 2023-09-22 MED ORDER — NITROGLYCERIN 0.3 MG SL SUBL
0.3000 mg | SUBLINGUAL_TABLET | SUBLINGUAL | 1 refills | Status: AC | PRN
Start: 2023-09-22 — End: ?

## 2023-09-22 MED ORDER — ROSUVASTATIN CALCIUM 20 MG PO TABS: 20.0000 mg | ORAL_TABLET | Freq: Every day | ORAL | 3 refills | Status: AC

## 2023-09-22 MED ORDER — ALENDRONATE SODIUM 70 MG PO TABS: 70.0000 mg | ORAL_TABLET | ORAL | 3 refills | Status: AC

## 2023-09-22 NOTE — Assessment & Plan Note (Signed)
 Discussed WC to help reduce pain in community-level mobility. She's not ready yet. Reviewed the recent vascular studies.  I'll reach out for my own understanding whether she is a candidate for any procedure.  She has a rollator.  Continues on ASA, clopidogrel , statin.  DM under great control. HTN usually controlled, a bit elevated today.

## 2023-09-22 NOTE — Patient Instructions (Addendum)
 [name],  The best news today is that you can stop your insulin !  This is great! Your A1c is 6.7.  We'll continue to monitor, and revisit if needed.  Refills completed.  If anything else is needed, just let me know.  See you in 3 months!  Dr. Trudy

## 2023-09-22 NOTE — Assessment & Plan Note (Addendum)
 A1c is now 6.7, after reducing insulin  to 10 units last visit. 100% readings in range!  Weight is stable. Ok to stop insulin ! Recheck 70M. Increase metformin  in future if needed.  At this point there is no reason to think that she has endocrine pancreatic failure (though does have exocrine insufficiency).  [employer] has informed her that she will need a new glucose meter, because [employer] will no longer cover Accuchecks.  I'll need to find an alternative.

## 2023-09-22 NOTE — Progress Notes (Unsigned)
 83 y.o. [name] is here for routine follow-up of DM2 (goal of eliminating insulin ), exocrine pancreatic insufficiency, and PAD with claudication.  Feeling ok generally, no leg pain unless walking, which is indeed quite painful.  Very infrequent abdominal pain, and bowels are moving and soft (no diarrhea); doesn't feel completely emptied after a BM which is annoying. No straining.   Weight is stable.  Still going out to eat, brings left-overs home.  Sometimes sick on stomach particularly if she eats something with high fat. Patient Active Problem List   Diagnosis Date Noted   Exocrine pancreatic insufficiency 09/21/2023   Memory changes 11/11/2022   Carotid stenosis, asymptomatic, bilateral 09/26/2021   Chronic heart failure with preserved ejection fraction (HCC) 04/18/2021   Urinary urgency, infrequent incontinence,, with nocturia 07/18/2020   Healthcare maintenance 12/27/2019   Chronic insomnia 05/18/2019   Pancreatic cysts, - look benign by MR 04/25/2019   H/O abnormal weight loss 03/17/2019   Hyperlipidemia 08/05/2018   GERD (gastroesophageal reflux disease) 01/17/2015   Chronic kidney disease, stage 3b (HCC) 09/27/2013   Localized, secondary osteoarthritis of the shoulder region 04/19/2013   Personal history of colon polyps, unspecified 12/29/2012   Mitral valve regurgitation 04/22/2010   Essential hypertension 09/09/2006   Constipation 09/09/2006   Dysphagia 03/03/2006   Osteoarthritis of back 12/23/2005   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/15/2005   Peripheral vascular disease (HCC) 10/15/2005   Psoriasis 10/15/2005   Age-related osteoporosis without current pathological fracture 10/15/2005   Controlled diabetes mellitus type 2 with complications (HCC) 01/14/2004    Current Outpatient Medications:    alendronate  (FOSAMAX ) 70 MG tablet, Take 1 tablet (70 mg total) by mouth every 7 (seven) days. Take with a full glass of water on an empty stomach. For  bone health., Disp: 12 tablet, Rfl: 3   amlodipine -olmesartan  (AZOR ) 10-20 MG tablet, Take 1 tablet by mouth daily., Disp: 90 tablet, Rfl: 3   Apremilast (OTEZLA) 30 MG TABS, Take 1 tablet by mouth in the morning and at bedtime., Disp: , Rfl:    aspirin  EC 81 MG tablet, Take 1 tablet (81 mg total) by mouth daily., Disp: 90 tablet, Rfl: 3   augmented betamethasone  dipropionate (DIPROLENE -AF) 0.05 % cream, Apply topically 2 (two) times daily., Disp: , Rfl:    Blood Glucose Monitoring Suppl (ACCU-CHEK GUIDE ME) w/Device KIT, 1 each by Does not apply route 3 (three) times daily., Disp: 1 kit, Rfl: 1   Cholecalciferol (VITAMIN D3) 50 MCG (2000 UT) TABS, Take 1 tablet (50 mcg total) by mouth daily., Disp: , Rfl:    clopidogrel  (PLAVIX ) 75 MG tablet, TAKE 1 TABLET(75 MG) BY MOUTH DAILY, Disp: 90 tablet, Rfl: 3   Continuous Glucose Receiver (FREESTYLE LIBRE 3 READER) DEVI, Use as directed, Disp: 1 each, Rfl: 1   Continuous Glucose Sensor (FREESTYLE LIBRE 3 SENSOR) MISC, Place new sensor every 14 days. Use to monitor blood sugar continuously., Disp: 6 each, Rfl: 3   empagliflozin  ([name] ) 25 MG TABS tablet, Take 1 tablet (25 mg total) by mouth daily before breakfast., Disp: 90 tablet, Rfl: 3   glucose blood (ACCU-CHEK GUIDE) test strip, Check blood sugar every morning fasting, Disp: 300 each, Rfl: 3   insulin  glargine (LANTUS  SOLOSTAR) 100 UNIT/ML Solostar Pen, ADMINISTER 10 UNITS UNDER THE SKIN AT BEDTIME, Disp: 45 mL, Rfl: 4   Insulin  Pen Needle (BD PEN NEEDLE NANO 2ND GEN) 32G X 4 MM MISC, Use for injection as prescribed, Disp: 200 each, Rfl: 1  Lancets (FREESTYLE) lancets, USE TO CHECK BLOOD SUGAR FOUR TIMES DAILY- BEFORE MEALS AND EVERY NIGHT AT BEDTIME, Disp: 100 each, Rfl: 5   lipase/protease/amylase (CREON ) 36000 UNITS CPEP capsule, Take 2 capsules (72,000 Units total) by mouth 3 (three) times daily with meals AND 1 capsule (36,000 Units total) with snacks., Disp: 250 capsule, Rfl: 11   metFORMIN   (GLUCOPHAGE ) 500 MG tablet, Take 1 tablet (500 mg total) by mouth 2 (two) times daily with a meal., Disp: 180 tablet, Rfl: 3   nitroGLYCERIN  (NITROSTAT ) 0.3 MG SL tablet, Place 1 tablet (0.3 mg total) under the tongue every 5 (five) minutes as needed for chest pain., Disp: 30 tablet, Rfl: 1   polyethylene glycol powder (GLYCOLAX ) 17 GM/SCOOP powder, Mix one package (or dose) in full glass of liquid to dissolve .  Drink one dose each morning., Disp: 116 g, Rfl: 1   rosuvastatin  (CRESTOR ) 20 MG tablet, Take 1 tablet (20 mg total) by mouth daily., Disp: 90 tablet, Rfl: 3  Functional Status: Independent in all IADLs and ADLs.  Has a rollator, not yet ready for a WC.  Lives alone, has a man partner. Active in her senior center.  Objective BP (!) 148/48 (BP Location: Right Arm, Patient Position: Sitting, Cuff Size: Small)   Pulse 69   Temp (!) 97.5 F (36.4 C) (Oral)   Ht 4' 10 (1.473 m)   Wt 101 lb (45.8 kg)   SpO2 100%   BMI 21.11 kg/m   Exam: Well appearing, nicely dressed and groomed as always, Feet and legs warm and well perfused, no wounds, though pulseless chronically.    Problems addressed today: Controlled type 2 diabetes mellitus with complication, without long-term current use of insulin  (HCC) Assessment & Plan: A1c is now 6.7, after reducing insulin  to 10 units last visit. 100% readings in range!  Weight is stable. Ok to stop insulin ! Recheck 13M. Increase metformin  in future if needed.  At this point there is no reason to think that she has endocrine pancreatic failure (though does have exocrine insufficiency).  [employer] has informed her that she will need a new glucose meter, because [employer] will no longer cover Accuchecks.  I'll need to find an alternative.   Orders: -     POCT glycosylated hemoglobin (Hb A1C) -     Microalbumin / creatinine urine ratio  S/P CABG (coronary artery bypass graft) -     Rosuvastatin  Calcium ; Take 1 tablet (20 mg total) by mouth daily.  Dispense: 90 tablet;  Refill: 3 -     Nitroglycerin ; Place 1 tablet (0.3 mg total) under the tongue every 5 (five) minutes as needed for chest pain.  Dispense: 30 tablet; Refill: 1  Dyslipidemia -     Rosuvastatin  Calcium ; Take 1 tablet (20 mg total) by mouth daily.  Dispense: 90 tablet; Refill: 3  Age-related osteoporosis without current pathological fracture -     Alendronate  Sodium; Take 1 tablet (70 mg total) by mouth every 7 (seven) days. Take with a full glass of water on an empty stomach. For bone health.  Dispense: 12 tablet; Refill: 3  Essential hypertension Assessment & Plan: 148/48 on amlodipine -olmesartan  10-20; usually well controlled, did take meds this morning, no change yet given her historic stability on same regimen.  Recheck next visit.  Orders: -     amLODIPine -Olmesartan ; Take 1 tablet by mouth daily.  Dispense: 90 tablet; Refill: 3  Peripheral vascular disease (HCC) Assessment & Plan: Discussed WC to help reduce pain in community-level  mobility. She's not ready yet. Reviewed the recent vascular studies.  I'll reach out for my own understanding whether she is a candidate for any procedure.  She has a rollator.  Continues on ASA, clopidogrel , statin.  DM under great control. HTN usually controlled, a bit elevated today. Orders: -     Clopidogrel  Bisulfate; TAKE 1 TABLET(75 MG) BY MOUTH DAILY  Dispense: 90 tablet; Refill: 3  Psoriasis Assessment & Plan: No recent flares, continues her Otezla.   Chronic kidney disease, stage 3b (HCC) Assessment & Plan: EGR 40 at last visit, stable.  On Empagliflozin . UAC today.

## 2023-09-23 ENCOUNTER — Ambulatory Visit: Payer: Self-pay | Admitting: Internal Medicine

## 2023-09-23 LAB — MICROALBUMIN / CREATININE URINE RATIO
Creatinine, Urine: 26.9 mg/dL
Microalb/Creat Ratio: 352 mg/g{creat} — ABNORMAL HIGH (ref 0–29)
Microalbumin, Urine: 94.6 ug/mL

## 2023-09-23 NOTE — Assessment & Plan Note (Signed)
 148/48 on amlodipine -olmesartan  10-20; usually well controlled, did take meds this morning, no change yet given her historic stability on same regimen.  Recheck next visit.

## 2023-09-23 NOTE — Assessment & Plan Note (Signed)
 EGR 40 at last visit, stable.  On Empagliflozin . UAC today.

## 2023-09-23 NOTE — Assessment & Plan Note (Signed)
 No recent flares, continues her Otezla.

## 2023-09-23 NOTE — Progress Notes (Signed)
 Unexpected doubling of ACR in a year, despite stable eGFR, well controlled DM, on stable doses of empagliflozin  25 mg and ARB.  Htn has been generally well controlled and doubtful any elevations are contributing.  Fineronone may be a consideration.  I'll review data.

## 2023-09-28 ENCOUNTER — Other Ambulatory Visit: Payer: Self-pay | Admitting: *Deleted

## 2023-09-28 DIAGNOSIS — I739 Peripheral vascular disease, unspecified: Secondary | ICD-10-CM

## 2023-09-28 DIAGNOSIS — I10 Essential (primary) hypertension: Secondary | ICD-10-CM

## 2023-09-28 MED ORDER — AMLODIPINE-OLMESARTAN 10-20 MG PO TABS: 1.0000 | ORAL_TABLET | Freq: Every day | ORAL | 3 refills | Status: AC

## 2023-09-28 MED ORDER — CLOPIDOGREL BISULFATE 75 MG PO TABS: ORAL_TABLET | ORAL | 3 refills | Status: AC

## 2023-10-14 ENCOUNTER — Encounter: Payer: Self-pay | Admitting: Internal Medicine

## 2023-10-14 NOTE — Telephone Encounter (Signed)
 Erroneous encounter

## 2023-10-21 ENCOUNTER — Other Ambulatory Visit: Payer: Self-pay | Admitting: *Deleted

## 2023-10-21 ENCOUNTER — Telehealth: Payer: Self-pay

## 2023-10-21 DIAGNOSIS — N1831 Chronic kidney disease, stage 3a: Secondary | ICD-10-CM

## 2023-10-21 MED ORDER — METFORMIN HCL 500 MG PO TABS: 500.0000 mg | ORAL_TABLET | Freq: Two times a day (BID) | ORAL | 3 refills | Status: DC

## 2023-10-21 MED ORDER — ACCU-CHEK GUIDE TEST VI STRP: ORAL_STRIP | 1 refills | Status: AC

## 2023-10-21 MED ORDER — METFORMIN HCL 500 MG PO TABS: 500.0000 mg | ORAL_TABLET | Freq: Two times a day (BID) | ORAL | 3 refills | Status: AC

## 2023-10-21 NOTE — Telephone Encounter (Signed)
 Pt stated she's unsure if her meter needs a new battery or she needs a new meter. I will ask Arland to help with finding the meter covered by her insurance. Pt also stated she needs a refill on Metformin .

## 2023-10-21 NOTE — Addendum Note (Signed)
 Addended by: CAMIE ARLAND HERO on: 10/21/2023 03:40 PM   Modules accepted: Orders

## 2023-10-21 NOTE — Telephone Encounter (Signed)
 Copied from CRM #[id number]. Topic: Clinical - Red Word Triage >> Oct 21, 2023 10:38 AM DeAngela L wrote: Red Word that prompted transfer to Nurse Triage:  patient states her meter is reading error any time she puts a strip in and this has been happening almost a week and the patient has not checked her blood sugar in a week, the patient is worried of what her blood sugar levels since she has not been able to check it  Blood Glucose Monitoring Suppl (ACCU-CHEK GUIDE ME) w/Device KIT  Pt num [phone] [id number] [place], [place] - [address] AT [facility] & [address] [place] [address] Phone: [phone] Fax: [phone]  (Patient decline NT call)

## 2023-10-21 NOTE — Telephone Encounter (Signed)
 Previous refill sent as Print; re-sending as Normal.

## 2023-10-21 NOTE — Telephone Encounter (Signed)
 Getting errors on meter, she thinks error is E-1, but was actually E-9 battery may need to be replaced. She agreed to take to pharmacy and get new batteries. Needs refill of strips.

## 2023-10-21 NOTE — Telephone Encounter (Signed)
 Copied from CRM #[id number]. Topic: Clinical - Prescription Issue >> Oct 21, 2023 10:26 AM DeAngela L wrote: Reason for CRM: patient returning call about her medication refill for metFORMIN  (GLUCOPHAGE ) 500 MG tablet And the patient states she only have 3 days of medication and did not understand why the refill was not sent to the pharmacy after her call on 10/14/2023 Pt num [phone] BENNIE)  [id number] [place], [place] - [address] AT [facility] & [address] [place] [address] Phone: [phone] Fax: [phone]

## 2023-10-21 NOTE — Addendum Note (Signed)
 Addended by: CAMMIE GAETANA DEL on: 10/21/2023 10:56 AM   Modules accepted: Orders

## 2023-11-04 ENCOUNTER — Encounter

## 2023-11-04 NOTE — Progress Notes (Signed)
 This encounter was created in error - please disregard.

## 2023-12-22 ENCOUNTER — Encounter: Payer: Self-pay | Admitting: Student

## 2023-12-22 ENCOUNTER — Ambulatory Visit: Payer: Self-pay | Admitting: Student

## 2023-12-22 ENCOUNTER — Ambulatory Visit: Admitting: Student

## 2023-12-22 ENCOUNTER — Other Ambulatory Visit: Payer: Self-pay

## 2023-12-22 ENCOUNTER — Telehealth: Payer: Self-pay | Admitting: Student

## 2023-12-22 VITALS — BP 132/69 | HR 86 | Temp 98.0°F | Ht <= 58 in | Wt 94.0 lb

## 2023-12-22 DIAGNOSIS — E118 Type 2 diabetes mellitus with unspecified complications: Secondary | ICD-10-CM

## 2023-12-22 DIAGNOSIS — K8681 Exocrine pancreatic insufficiency: Secondary | ICD-10-CM

## 2023-12-22 LAB — POCT GLYCOSYLATED HEMOGLOBIN (HGB A1C): HbA1c, POC (controlled diabetic range): 7.2 % — AB (ref 0.0–7.0)

## 2023-12-22 LAB — CBC
Hematocrit: 37.3 % (ref 34.0–46.6)
Hemoglobin: 12 g/dL (ref 11.1–15.9)
MCH: 29.6 pg (ref 26.6–33.0)
MCHC: 32.2 g/dL (ref 31.5–35.7)
MCV: 92 fL (ref 79–97)
Platelets: 273 x10E3/uL (ref 150–450)
RBC: 4.05 x10E6/uL (ref 3.77–5.28)
RDW: 13.4 % (ref 11.7–15.4)
WBC: 10.6 x10E3/uL (ref 3.4–10.8)

## 2023-12-22 LAB — BASIC METABOLIC PANEL WITH GFR
BUN/Creatinine Ratio: 16 (ref 12–28)
BUN: 35 mg/dL — ABNORMAL HIGH (ref 8–27)
CO2: 16 mmol/L — ABNORMAL LOW (ref 20–29)
Calcium: 9.5 mg/dL (ref 8.7–10.3)
Chloride: 95 mmol/L — ABNORMAL LOW (ref 96–106)
Creatinine, Ser: 2.13 mg/dL — ABNORMAL HIGH (ref 0.57–1.00)
Glucose: 163 mg/dL — ABNORMAL HIGH (ref 70–99)
Potassium: 5.1 mmol/L (ref 3.5–5.2)
Sodium: 140 mmol/L (ref 134–144)
eGFR: 22 mL/min/1.73 — ABNORMAL LOW (ref >59)

## 2023-12-22 LAB — GLUCOSE, CAPILLARY: Glucose-Capillary: 139 mg/dL — ABNORMAL HIGH (ref 70–99)

## 2023-12-22 MED ORDER — PANCRELIPASE (LIP-PROT-AMYL) 36000-114000 UNITS PO CPEP
ORAL_CAPSULE | ORAL | 0 refills | Status: DC
  Filled 2023-12-22: qty 200, 23d supply, fill #0

## 2023-12-22 MED ORDER — PANCRELIPASE (LIP-PROT-AMYL) 36000-114000 UNITS PO CPEP
ORAL_CAPSULE | ORAL | 0 refills | Status: AC
  Filled 2023-12-22: qty 200, 23d supply, fill #0

## 2023-12-22 NOTE — Progress Notes (Signed)
 [name] creatinine has risen from 1.31 to 2.13 (an increase of 0.82 mg/dL), which meets the criteria for acute kidney injury , likely prerenal in origin, due to intravascular volume depletion in the setting of dehydration. This is likely secondary to insufficient oral intake, compounded by malabsorption due to missed pancreatic enzyme replacement therapy. Additionally, a decrease in bicarbonate is noted, which may be related to impaired renal function.Given that she has been unable to keep anything down for several days, I am concerned that her AKI could worsen without adequate hydration. I recommend that she hold her Azor , metformin , and [name] , and increase her fluid intake. I will see her back in the clinic on Thursday for follow-up to repeat the BMP. Plan: - Hold Metformin  - Hold Azor   - Hold [name]  - Increase fluid intake  - Follow up on thursday @9 :15 and repeat BMP - Consider resuming this medication if AKI has resolved  I was able to finally speak with [name] and her husband to relay this information, and they both endorsed understanding of the plan.   Drue Lisa Grow MD 12/22/2023, 2:14 PM

## 2023-12-22 NOTE — Patient Instructions (Addendum)
 Thank you, [name], for allowing us  to provide your care today. During our visit, we discussed your abdominal discomfort and nausea. I'm sorry to hear that you haven't been able to pick up your Creon  for the past week, as I suspect this may be contributing to your symptoms.  I spent some time on the phone with the pharmacy, and it seems that you should be able to pick up your Creon  from your [facility] pharmacy today after 2 PM. The pharmacy here at the clinic had a few samples available, but your insurance will only authorize the medication to be filled at [facility], so I apologize for the inconvenience.  Since you haven't been able to eat or drink for a while, I'm concerned about your kidney function and would like to order some blood work to assess it. We will also check for anemia, given your low energy state. Please let me know if you have any further questions or concerns.  Please call the clinic and let us  know after you pick up the medications today and also update us  on how you doing.  Give you a call when your labs return.  I have ordered the following labs for you:  Lab Orders         Glucose, capillary         CBC no Diff         Basic metabolic panel with GFR         POC Hbg A1C      Tests ordered today:    Referrals ordered today:   Referral Orders  No referral(s) requested today     I have ordered the following medication/changed the following medications:   Stop the following medications: Medications Discontinued During This Encounter  Medication Reason   lipase/protease/amylase (CREON ) 36000 UNITS CPEP capsule Reorder     Start the following medications: Meds ordered this encounter  Medications   lipase/protease/amylase (CREON ) 36000 UNITS CPEP capsule    Sig: Take 2 capsules (72,000 Units total) by mouth 3 (three) times daily with meals AND 1 capsule (36,000 Units total) with snacks.    Dispense:  200 capsule    Refill:  0     Follow up:     Remember:   Should you have any questions or concerns please call the internal medicine clinic at [phone].   Drue Lisa Grow MD 12/22/2023, 11:37 AM   [facility]

## 2023-12-22 NOTE — Progress Notes (Addendum)
 CC: Nausea and low energy   Her last OV was in June 2025 with Dr trudy   HPI:  [name] is a 83 y.o. female living with a history stated below and presents today due to concerns for mild abdominal bloating and discomfort and nausea. Please see problem based assessment and plan for additional details.  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/14/2004   Controlled type 2 diabetes mellitus with stage 3 chronic kidney disease, without long-term current use of insulin  (HCC) 01/14/2004   De Quervain's tenosynovitis, left 11/13/2015   Diabetes (HCC)    Diverticulosis    DIVERTICULOSIS OF COLON 04/01/2007   Qualifier: Diagnosis of  By: Marcelo CMA (AAMA), [name]     DM (diabetes mellitus) (HCC)    insulin  dependent   Dyslipidemia 10/15/2005   Qualifier: Diagnosis of  By: Marylynn MD, Verneita     High blood pressure    History of colonic polyps    HLD (hyperlipidemia)    HTN (hypertension)    Lumbar spinal stenosis    MI (myocardial infarction) (HCC) 2003   . [place]   Mouth ulcer adjacent to jaw bone 08/22/2010   S/p removal by ENT 09/2010    MYOCARDIAL INFARCTION, HX OF 09/09/2006   Qualifier: Diagnosis of  By: Lanis MD, Christopher     Nephrolithiasis    hx of   NEPHROLITHIASIS 04/01/2007   Qualifier: Diagnosis of  By: Marcelo CMA (AAMA), [name]     OA (osteoarthritis)    Osteopenia 2010   T score of -1.8   Pancreatic  cysts, - look benign by MR 04/25/2019   Personal history of colon polyps, unspecified 12/29/2012            Psoriasis    PVD (peripheral vascular disease)    ABI indicated moderated reduction arterial flow on the left.   Tricuspid regurgitation 2009   moderate to severe, EF 55%    Current Outpatient Medications on File Prior to Visit  Medication Sig Dispense Refill   alendronate  (FOSAMAX ) 70 MG tablet Take 1 tablet (70 mg total) by mouth every 7 (seven) days. Take with a full glass of water on an empty stomach. For bone health. 12 tablet 3   amlodipine -olmesartan  (AZOR ) 10-20 MG tablet Take 1 tablet by mouth daily. 90 tablet 3   Apremilast (OTEZLA) 30 MG TABS Take 1 tablet by mouth in the morning and at bedtime.     aspirin  EC 81 MG tablet Take 1 tablet (81 mg total) by mouth daily. 90 tablet 3   augmented betamethasone  dipropionate (DIPROLENE -AF) 0.05 % cream Apply topically 2 (two) times daily.     Blood Glucose Monitoring Suppl (ACCU-CHEK GUIDE ME) w/Device KIT 1 each by Does not apply route 3 (three) times daily. 1 kit 1   Cholecalciferol (VITAMIN D3) 50 MCG (2000 UT) TABS Take 1 tablet (50 mcg total) by mouth daily.  clopidogrel  (PLAVIX ) 75 MG tablet TAKE 1 TABLET(75 MG) BY MOUTH DAILY 90 tablet 3   empagliflozin  ([name] ) 25 MG TABS tablet Take 1 tablet (25 mg total) by mouth daily before breakfast. 90 tablet 3   glucose blood (ACCU-CHEK GUIDE TEST) test strip Check blood sugar every morning fasting 300 each 1   Lancets (FREESTYLE) lancets USE TO CHECK BLOOD SUGAR FOUR TIMES DAILY- BEFORE MEALS AND EVERY NIGHT AT BEDTIME 100 each 5   metFORMIN  (GLUCOPHAGE ) 500 MG tablet Take 1 tablet (500 mg total) by mouth 2 (two) times daily with a meal. 180 tablet 3   nitroGLYCERIN  (NITROSTAT ) 0.3 MG SL tablet Place 1 tablet (0.3 mg total) under the tongue every 5 (five) minutes as needed for chest pain. 30 tablet 1   rosuvastatin  (CRESTOR ) 20 MG tablet Take 1 tablet (20 mg total) by mouth  daily. 90 tablet 3   [DISCONTINUED] Calcium  Carb-Cholecalciferol (CALCIUM  600 + D PO) Take 1 tablet by mouth 2 (two) times daily.     No current facility-administered medications on file prior to visit.    Family History  Problem Relation Age of Onset   Hypertension Mother    Hyperlipidemia Mother    Breast cancer Sister    Breast cancer Daughter 6       metastatic , in her bones   Cirrhosis Brother    Heart disease Brother    Heart attack Brother    Hyperlipidemia Son    Hypertension Son    Liver disease Neg Hx    Esophageal cancer Neg Hx    Colon cancer Neg Hx     Social History   Socioeconomic History   Marital status: Widowed    Spouse name: Not on file   Number of children: Not on file   Years of education: Not on file   Highest education level: Not on file  Occupational History   Not on file  Tobacco Use   Smoking status: Never   Smokeless tobacco: Never  Vaping Use   Vaping status: Never Used  Substance and Sexual Activity   Alcohol use: No    Alcohol/week: 0.0 standard drinks of alcohol   Drug use: Never   Sexual activity: Not on file  Other Topics Concern   Not on file  Social History Narrative   Retired conservation officer, nature   Widow/Widower   lives in [place]   4 kids (2 sons, 2 daughters) one son died many years ago in car accident.  other kids are all in [place].   Has friend that helps take care of her   Social Drivers of Health   Financial Resource Strain: Low Risk  (08/26/2022)   Overall Financial Resource Strain (CARDIA)    Difficulty of Paying Living Expenses: Not hard at all  Food Insecurity: No Food Insecurity (08/26/2022)   Hunger Vital Sign    Worried About Running Out of Food in the Last Year: Never true    Ran Out of Food in the Last Year: Never true  Transportation Needs: No Transportation Needs (08/26/2022)   PRAPARE - Administrator, Civil Service (Medical): No    Lack of Transportation (Non-Medical): No   Physical Activity: Patient Unable To Answer (08/26/2022)   Exercise Vital Sign    Days of Exercise per Week: Patient unable to answer    Minutes of Exercise per Session: Patient unable to answer  Stress: No Stress Concern Present (08/26/2022)   [employer] of Occupational Health -  Occupational Stress Questionnaire    Feeling of Stress : Only a little  Social Connections: Moderately Integrated (08/26/2022)   Social Connection and Isolation Panel    Frequency of Communication with Friends and Family: Three times a week    Frequency of Social Gatherings with Friends and Family: Not on file    Attends Religious Services: More than 4 times per year    Active Member of Clubs or Organizations: Yes    Attends Banker Meetings: 1 to 4 times per year    Marital Status: Widowed  Intimate Partner Violence: Not At Risk (08/26/2022)   Humiliation, Afraid, Rape, and Kick questionnaire    Fear of Current or Ex-Partner: No    Emotionally Abused: No    Physically Abused: No    Sexually Abused: No    Review of Systems: ROS negative except for what is noted on the assessment and plan.  Vitals:   12/22/23 1031  BP: 132/69  Pulse: 86  Temp: 98 F (36.7 C)  TempSrc: Oral  SpO2: 96%  Weight: 94 lb (42.6 kg)  Height: 4' 10 (1.473 m)    Physical Exam: Constitutional: well-appearing woman, sitting in wheelchair , in no acute distress HENT: normocephalic atraumatic, mucous membranes moist Eyes: conjunctiva non-erythematous Cardiovascular: regular rate and rhythm, no m/r/g Pulmonary/Chest: normal work of breathing on room air, lungs clear to auscultation bilaterally Abdominal: soft, non-tender, non-distended Neurological: alert & oriented x 3, no focal deficit Skin: warm and dry. Poor skin turgor was noted upon assessment at the clavicular region. Psych: normal mood and behavior  Assessment & Plan:   Exocrine pancreatic insufficiency This is an 83 year old female with a  history of coronary artery disease (s/p CABG), heart failure with preserved ejection fraction , well-controlled complicated type 2 diabetes mellitus, chronic kidney disease stage 3, gastroesophageal reflux disease , and hypertension. She presents with nausea, mild abdominal bloating and discomfort, and low energy over the past 2-3 days. Her symptoms worsen with eating, so she has not eaten since Friday. She denies fever, chills, muscle aches, dysuria, constipation, cough, chest pain, or shortness of breath, and reports no heartburn.She reports that she has not taken her Creon  since last week and has had difficulty reaching the pharmacy, which informed her that the medication is on backorder. On examination, the patient is afebrile with a blood pressure of 132/69 and a normal heart rate. She is sitting in a wheelchair, holding her stomach, but there is no abdominal tenderness. There is no lower extremity edema, but she demonstrates poor skin turgor. Given the timing of her presentation and the fact that she has missed her Creon , I suspect her symptoms are primarily due to the effects of enzyme replacement therapy interruption, possibly compounded by dehydration. While viral gastroenteritis is a consideration, the absence of malaise and  diarrhea makes this less likely. To further investigate potential causes, including anemia and electrolyte imbalances, and most importantly to assess kidney function given her reduced oral intake over the past several days, I will order a CBC and BMP.This will also assist me in making informed decisions regarding his medications that are cleared by the kidneys.  The patient also mentioned that she has been calling [facility] since last week and was told the medication was on backorder. However, upon reviewing her dispensing history, Creon  was last filled on December 5, just four days ago. I attempted to arrange a 2-week supply from [facility], but her insurance will not  authorize a refill due to  the recent dispensing at [facility]. After contacting [facility] again, they confirmed there was an issue with  the prescription but reassured me the medication will be filled today, and she should be able to pick it up by 2 PM. They apologized for the inconvenience.  Plan:  - Continue Creon  72000 unit TID with meals and 36000 units with snack  - Follow up with [facility] on [address] by 3pm to confirm if she is picked up her Creon  - Ensure the patient is aware of the plan for refills moving forward and confirm that [facility] will continue to fulfill her medication in the future. If issues persist with obtaining her Creon , alternative pharmacy options or coordination with the insurance provider should be explored.  - Encourage fluid intake if possible. If the patient is still unable to drink or eat, consider starting oral rehydration solutions or discussing the need for IV hydration if she remains dehydrated.We discussed signs of severe hydration that warrants ED visits - CBC: To evaluate for anemia, which could contribute to her low energy. BMP: To assess kidney function (given her CKD3), electrolyte imbalances, and dehydration status. - Arrange a follow-up appointment in 3-5 days or sooner if symptoms worsen. If her symptoms resolve after restarting Creon  and addressing hydration, schedule a routine follow-up for chronic disease management.     Patient discussed with Dr. Lovie Drue Grow, M.D [facility] Phone: [phone] Date 12/22/2023 Time 4:50 PM

## 2023-12-22 NOTE — Assessment & Plan Note (Addendum)
 This is an 83 year old female with a history of coronary artery disease (s/p CABG), heart failure with preserved ejection fraction , well-controlled complicated type 2 diabetes mellitus, chronic kidney disease stage 3, gastroesophageal reflux disease , and hypertension. She presents with nausea, mild abdominal bloating and discomfort, and low energy over the past 2-3 days. Her symptoms worsen with eating, so she has not eaten since Friday. She denies fever, chills, muscle aches, dysuria, constipation, cough, chest pain, or shortness of breath, and reports no heartburn.She reports that she has not taken her Creon  since last week and has had difficulty reaching the pharmacy, which informed her that the medication is on backorder. On examination, the patient is afebrile with a blood pressure of 132/69 and a normal heart rate. She is sitting in a wheelchair, holding her stomach, but there is no abdominal tenderness. There is no lower extremity edema, but she demonstrates poor skin turgor. Given the timing of her presentation and the fact that she has missed her Creon , I suspect her symptoms are primarily due to the effects of enzyme replacement therapy interruption, possibly compounded by dehydration. While viral gastroenteritis is a consideration, the absence of malaise and  diarrhea makes this less likely. To further investigate potential causes, including anemia and electrolyte imbalances, and most importantly to assess kidney function given her reduced oral intake over the past several days, I will order a CBC and BMP.This will also assist me in making informed decisions regarding his medications that are cleared by the kidneys.  The patient also mentioned that she has been calling [facility] since last week and was told the medication was on backorder. However, upon reviewing her dispensing history, Creon  was last filled on December 5, just four days ago. I attempted to arrange a 2-week supply from [facility], but her insurance will not authorize a refill due to the recent dispensing at [facility]. After contacting [facility] again, they confirmed there was an issue with  the prescription but reassured me the medication will be filled today, and she should be able to pick it up by 2 PM. They apologized for the inconvenience.  Plan:  - Continue Creon  72000 unit TID with meals and 36000 units with snack  - Follow up with [facility] on [address] by 3pm to confirm if she is picked up her Creon  - Ensure the patient is aware of the plan for refills moving forward and confirm that [facility] will continue to fulfill her medication in the future. If issues persist with obtaining her Creon , alternative pharmacy options or coordination with the insurance provider should be explored.  - Encourage fluid intake if possible. If the patient is still unable to drink or eat, consider starting oral rehydration solutions or discussing the need for IV hydration if she remains dehydrated.We discussed signs of severe hydration that warrants ED visits - CBC: To evaluate for anemia, which could contribute to her low energy. BMP: To assess kidney function (given her CKD3), electrolyte imbalances, and dehydration status. - Arrange a follow-up appointment in 3-5 days or sooner if symptoms worsen. If her symptoms resolve after restarting Creon  and addressing hydration, schedule a routine follow-up for chronic disease management.

## 2023-12-22 NOTE — Telephone Encounter (Signed)
 I called [name] to confirm whether she was able to pick up her Creon , and she confirmed that she has picked it up. She has resumed taking it as prescribed and is holding her Azor , metformin , and [name]  as previously discussed. She is also making efforts to increase her oral intake.

## 2023-12-24 ENCOUNTER — Ambulatory Visit: Admitting: Student

## 2023-12-24 VITALS — BP 159/48 | HR 63 | Temp 97.9°F | Ht <= 58 in | Wt 94.4 lb

## 2023-12-24 DIAGNOSIS — Z8249 Family history of ischemic heart disease and other diseases of the circulatory system: Secondary | ICD-10-CM | POA: Diagnosis not present

## 2023-12-24 DIAGNOSIS — N1832 Chronic kidney disease, stage 3b: Secondary | ICD-10-CM | POA: Diagnosis not present

## 2023-12-24 DIAGNOSIS — K8681 Exocrine pancreatic insufficiency: Secondary | ICD-10-CM

## 2023-12-24 DIAGNOSIS — I10 Essential (primary) hypertension: Secondary | ICD-10-CM

## 2023-12-24 DIAGNOSIS — N179 Acute kidney failure, unspecified: Secondary | ICD-10-CM | POA: Diagnosis not present

## 2023-12-24 DIAGNOSIS — Z79899 Other long term (current) drug therapy: Secondary | ICD-10-CM | POA: Diagnosis not present

## 2023-12-24 LAB — BASIC METABOLIC PANEL WITH GFR
BUN/Creatinine Ratio: 19 (ref 12–28)
BUN: 36 mg/dL — ABNORMAL HIGH (ref 8–27)
CO2: 24 mmol/L (ref 20–29)
Calcium: 9.4 mg/dL (ref 8.7–10.3)
Chloride: 99 mmol/L (ref 96–106)
Creatinine, Ser: 1.91 mg/dL — ABNORMAL HIGH (ref 0.57–1.00)
Glucose: 170 mg/dL — ABNORMAL HIGH (ref 70–99)
Potassium: 4.4 mmol/L (ref 3.5–5.2)
Sodium: 139 mmol/L (ref 134–144)
eGFR: 25 mL/min/1.73 — ABNORMAL LOW (ref >59)

## 2023-12-24 NOTE — Progress Notes (Signed)
 Internal Medicine Clinic Attending  Case discussed with the resident at the time of the visit.  We reviewed the residents history and exam and pertinent patient test results.  I agree with the assessment, diagnosis, and plan of care documented in the residents note.    Patient ran out of her Creon , and subsequently developed nausea, bloating, and abdominal discomfort. VSS, but Dr Renne appreciated decreased skin turgor  She has an AKI  I agree this is likely pre-renal She got her Creon  Agree with plan to hold medicines as outlined in his note She will return on 12/11 for repeat assessment and labs

## 2023-12-24 NOTE — Patient Instructions (Signed)
 Thank you, [name], for allowing us  to provide your care today. We discussed your abdominal discomfort, nausea, and vomiting, and Im glad to hear that your symptoms have improved after resuming your Creon . I will be ordering blood work to assess your kidney function, and once the results are available, I will give you a call to discuss whether you need to resume your inhaled medications or not, based on the findings  I have ordered the following labs for you:  Lab Orders         Basic metabolic panel with GFR      Tests ordered today:    Referrals ordered today:   Referral Orders  No referral(s) requested today     I have ordered the following medication/changed the following medications:   Stop the following medications: There are no discontinued medications.   Start the following medications: No orders of the defined types were placed in this encounter.    Follow up: [name] will return for a follow-up visit in 2-4 weeks to reassess kidney function, blood pressure, and overall progress with her medication regimen.   Remember: Continue drinking plenty of fluids  Should you have any questions or concerns please call the internal medicine clinic at [phone].   Drue Lisa Grow MD 12/24/2023, 9:48 AM   [facility]

## 2023-12-24 NOTE — Progress Notes (Unsigned)
 CC: 2 days follow-up  HPI:  [name] is an 83 year old female with a medical history as outlined below, presenting today for a 2-day follow-up regarding her abdominal discomfort, nausea, and vomiting, which were secondary to missing her Creon  for one week. She is also here to further assess her acute kidney injury (AKI) and to resume her medications as appropriate. Please refer to the problem-based assessment and plan for additional details.  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/14/2004   Controlled type 2 diabetes mellitus with stage 3 chronic kidney disease, without long-term current use of insulin  (HCC) 01/14/2004   De Quervain's tenosynovitis, left 11/13/2015   Diabetes (HCC)    Diverticulosis    DIVERTICULOSIS OF COLON 04/01/2007   Qualifier: Diagnosis of  By: Marcelo CMA (AAMA), [name]     DM (diabetes mellitus) (HCC)    insulin  dependent   Dyslipidemia 10/15/2005   Qualifier: Diagnosis of  By: Marylynn MD, Verneita     High blood pressure    History of colonic polyps    HLD (hyperlipidemia)    HTN (hypertension)    Lumbar spinal stenosis    MI (myocardial infarction) (HCC) 2003   . [place]   Mouth ulcer adjacent to jaw bone 08/22/2010   S/p removal by ENT 09/2010    MYOCARDIAL INFARCTION, HX OF 09/09/2006   Qualifier: Diagnosis of  By: Lanis MD, Christopher     Nephrolithiasis    hx of   NEPHROLITHIASIS 04/01/2007    Qualifier: Diagnosis of  By: Marcelo CMA (AAMA), [name]     OA (osteoarthritis)    Osteopenia 2010   T score of -1.8   Pancreatic cysts, - look benign by MR 04/25/2019   Personal history of colon polyps, unspecified 12/29/2012            Psoriasis    PVD (peripheral vascular disease)    ABI indicated moderated reduction arterial flow on the left.   Tricuspid regurgitation 2009   moderate to severe, EF 55%    Medications Ordered Prior to Encounter[1]  Family History  Problem Relation Age of Onset   Hypertension Mother    Hyperlipidemia Mother    Breast cancer Sister    Breast cancer Daughter 67       metastatic , in her bones   Cirrhosis Brother    Heart disease Brother    Heart attack Brother    Hyperlipidemia Son    Hypertension Son    Liver disease Neg Hx    Esophageal cancer Neg Hx    Colon cancer Neg Hx     Social History   Socioeconomic History   Marital status: Widowed    Spouse name: Not on file   Number of children: Not on file   Years of education: Not on file   Highest education level: Not on file  Occupational History   Not on file  Tobacco Use   Smoking status: Never   Smokeless tobacco: Never  Vaping Use   Vaping status: Never Used  Substance and Sexual Activity   Alcohol use: No    Alcohol/week: 0.0 standard drinks of alcohol   Drug use: Never   Sexual activity: Not on file  Other Topics Concern   Not on file  Social History Narrative   Retired conservation officer, nature   Widow/Widower   lives in [place]   4 kids (2 sons, 2 daughters) one son died many years ago in car accident.  other kids are all in [place].   Has friend that helps take care of her   Social Drivers of Health   Tobacco Use: Low Risk (12/22/2023)   Patient History    Smoking Tobacco Use: Never    Smokeless Tobacco Use: Never    Passive Exposure: Not on file  Financial Resource Strain: Low Risk (08/26/2022)   Overall Financial Resource Strain (CARDIA)     Difficulty of Paying Living Expenses: Not hard at all  Food Insecurity: No Food Insecurity (08/26/2022)   Hunger Vital Sign    Worried About Running Out of Food in the Last Year: Never true    Ran Out of Food in the Last Year: Never true  Transportation Needs: No Transportation Needs (08/26/2022)   PRAPARE - Administrator, Civil Service (Medical): No    Lack of Transportation (Non-Medical): No  Physical Activity: Patient Unable To Answer (08/26/2022)   Exercise Vital Sign    Days of Exercise per Week: Patient unable to answer    Minutes of Exercise per Session: Patient unable to answer  Stress: No Stress Concern Present (08/26/2022)   [employer] of Occupational Health - Occupational Stress Questionnaire    Feeling of Stress : Only a little  Social Connections: Moderately Integrated (08/26/2022)   Social Connection and Isolation Panel    Frequency of Communication with Friends and Family: Three times a week    Frequency of Social Gatherings with Friends and Family: Not on file    Attends Religious Services: More than 4 times per year    Active Member of Clubs or Organizations: Yes    Attends Banker Meetings: 1 to 4 times per year    Marital Status: Widowed  Intimate Partner Violence: Not At Risk (08/26/2022)   Humiliation, Afraid, Rape, and Kick questionnaire    Fear of Current or Ex-Partner: No    Emotionally Abused: No    Physically Abused: No    Sexually Abused: No  Depression (PHQ2-9): Low Risk (12/22/2023)   Depression (PHQ2-9)    PHQ-2 Score: 0  Alcohol Screen: Low Risk (08/26/2022)   Alcohol Screen    Last Alcohol Screening Score (AUDIT): 0  Housing: Low Risk (08/26/2022)   Housing    Last Housing Risk Score: 0  Utilities: Not At Risk (08/26/2022)   AHC Utilities    Threatened with loss of utilities: No  Health Literacy: Not on file    Review of Systems: ROS negative except for what is noted on the assessment and plan.  Vitals:   12/24/23  0907 12/24/23 0933  BP: (!) 167/57 (!) 159/48  Pulse: 68 63  Temp: 97.9 F (36.6 C)   TempSrc: Oral   SpO2: 100%   Weight: 94 lb 6.4 oz (42.8 kg)   Height: 4' 10 (1.473 m)     Physical Exam: Constitutional: well-appearing woman , sitting in chair , in no acute distress  HENT: normocephalic atraumatic, mucous membranes moist Eyes: conjunctiva non-erythematous Cardiovascular: regular rate and rhythm, no m/r/g Pulmonary/Chest: normal work of breathing on room air, lungs clear to auscultation bilaterally Abdominal: soft, non-tender, non-distended MSK: normal bulk and tone Neurological: alert & oriented x 3, no focal deficit Skin: warm and dry Psych: normal mood and behavior  Assessment & Plan:   Exocrine pancreatic insufficiency Ms. Vierling returns today for a follow-up after her visit two days ago, where she presented with abdominal bloating, nausea, and vomiting. These symptoms were attributed to missing her Creon  medication for over a week. She was able to obtain the medication complete resolution of her symptoms at today's visit. At her last visit, I ordered a BMP that showed concerning results suggestive of acute kidney injury, which led to the temporary discontinuation of her metformin , Azor , and [name] .  She denies any urinary symptoms Plan: I will order a repeat BMP to further assess her kidney function and determine if there has been any improvement or if further action is needed. Resumption of Medications: Based on the results of the repeat BMP, I will cautiously consider resuming her previously held medications (metformin , Azor , and [name] ) as clinically indicated, with close monitoring. Continue to monitor her kidney function carefully, adjusting her medication regimen as necessary depending on the results of the repeat BMP.  Essential hypertension BP Readings from Last 3 Encounters:  12/24/23 (!) 159/48  12/22/23 132/69  09/22/23 (!) 153/54  Mr. Bober' blood  pressure is elevated today in the office. I suspect this may be due to holding his Azor  (amlodipine /olmesartan ) following the acute kidney injury identified during his last visit. As mentioned earlier, I plan to repeat the BMP to further evaluate kidney function, and based on the results, I will cautiously resume his Azor  if clinically indicated, as blood pressure control is important for overall health. Plan: Order a repeat BMP to assess kidney function and guide further medication adjustments. Once the BMP results are available, resume Azor  if kidney function is stable and appropriate, to help manage his blood pressure. Monitor blood pressure closely after resumption of therapy. Close Monitoring: Continue to monitor both blood pressure and kidney function carefully, adjusting medications as needed.     Patient discussed with Dr. Jeanelle Drue Grow, M.D [facility] Phone: [phone] Date 12/25/2023 Time 8:06 AM     [1]  Current Outpatient Medications on File Prior to Visit  Medication Sig Dispense Refill   alendronate  (FOSAMAX ) 70 MG tablet Take 1 tablet (70 mg total) by mouth every 7 (seven) days. Take with a full glass of water on an empty stomach. For bone health. 12 tablet 3   amlodipine -olmesartan  (AZOR ) 10-20 MG tablet Take 1 tablet by mouth daily. 90 tablet 3   Apremilast (OTEZLA) 30 MG TABS Take 1 tablet by mouth in the morning and at bedtime.     aspirin  EC 81 MG tablet Take 1 tablet (81 mg total) by mouth daily. 90 tablet 3   augmented betamethasone  dipropionate (DIPROLENE -AF) 0.05 % cream Apply topically 2 (two) times daily.     Blood Glucose Monitoring Suppl (ACCU-CHEK GUIDE ME) w/Device KIT 1 each by Does not apply route 3 (three) times daily. 1 kit 1   Cholecalciferol (VITAMIN D3) 50 MCG (2000 UT) TABS Take 1 tablet (50 mcg total) by mouth daily.     clopidogrel  (PLAVIX ) 75 MG tablet TAKE 1 TABLET(75 MG) BY MOUTH DAILY 90 tablet 3    empagliflozin  ([name] ) 25  MG TABS tablet Take 1 tablet (25 mg total) by mouth daily before breakfast. 90 tablet 3   glucose blood (ACCU-CHEK GUIDE TEST) test strip Check blood sugar every morning fasting 300 each 1   Lancets (FREESTYLE) lancets USE TO CHECK BLOOD SUGAR FOUR TIMES DAILY- BEFORE MEALS AND EVERY NIGHT AT BEDTIME 100 each 5   lipase/protease/amylase (CREON ) 36000 UNITS CPEP capsule Take 2 capsules (72,000 Units total) by mouth 3 (three) times daily with meals AND 1 capsule (36,000 Units total) with snacks. 200 capsule 0   metFORMIN  (GLUCOPHAGE ) 500 MG tablet Take 1 tablet (500 mg total) by mouth 2 (two) times daily with a meal. 180 tablet 3   nitroGLYCERIN  (NITROSTAT ) 0.3 MG SL tablet Place 1 tablet (0.3 mg total) under the tongue every 5 (five) minutes as needed for chest pain. 30 tablet 1   rosuvastatin  (CRESTOR ) 20 MG tablet Take 1 tablet (20 mg total) by mouth daily. 90 tablet 3   [DISCONTINUED] Calcium  Carb-Cholecalciferol (CALCIUM  600 + D PO) Take 1 tablet by mouth 2 (two) times daily.     No current facility-administered medications on file prior to visit.

## 2023-12-24 NOTE — Assessment & Plan Note (Addendum)
 Ms. Stipes returns today for a follow-up after her visit two days ago, where she presented with abdominal bloating, nausea, and vomiting. These symptoms were attributed to missing her Creon  medication for over a week. She was able to obtain the medication complete resolution of her symptoms at today's visit. At her last visit, I ordered a BMP that showed concerning results suggestive of acute kidney injury, which led to the temporary discontinuation of her metformin , Azor , and [name] .  She denies any urinary symptoms Plan: I will order a repeat BMP to further assess her kidney function and determine if there has been any improvement or if further action is needed. Resumption of Medications: Based on the results of the repeat BMP, I will cautiously consider resuming her previously held medications (metformin , Azor , and [name] ) as clinically indicated, with close monitoring. Continue to monitor her kidney function carefully, adjusting her medication regimen as necessary depending on the results of the repeat BMP.

## 2023-12-24 NOTE — Assessment & Plan Note (Signed)
 BP Readings from Last 3 Encounters:  12/24/23 (!) 159/48  12/22/23 132/69  09/22/23 (!) 153/54  [name]' blood pressure is elevated today in the office. I suspect this may be due to holding his Azor  (amlodipine /olmesartan ) following the acute kidney injury identified during his last visit. As mentioned earlier, I plan to repeat the BMP to further evaluate kidney function, and based on the results, I will cautiously resume his Azor  if clinically indicated, as blood pressure control is important for overall health. Plan: Order a repeat BMP to assess kidney function and guide further medication adjustments. Once the BMP results are available, resume Azor  if kidney function is stable and appropriate, to help manage his blood pressure. Monitor blood pressure closely after resumption of therapy. Close Monitoring: Continue to monitor both blood pressure and kidney function carefully, adjusting medications as needed.

## 2023-12-25 ENCOUNTER — Telehealth: Payer: Self-pay | Admitting: Student

## 2023-12-25 NOTE — Telephone Encounter (Signed)
 I called Mr. Boza to discuss her BMP results. Although her creatinine has improved from 2.13 to 1.91 mg/dL, I remain concerned about initiating her held medications at this time. I discussed the risks and benefits of resuming her medications versus waiting for kidney function to normalize. We agreed that she will continue to increase her fluid intake and return to the lab on Monday for a repeat BMP. If her creatinine returns to normal at that time, we will consider resuming Azor , [name] , and metformin .  Drue Lisa Grow MD 12/25/2023, 4:50 PM

## 2023-12-25 NOTE — Progress Notes (Signed)
 Internal Medicine Clinic Attending  Case discussed with the resident at the time of the visit.  We reviewed the resident's history and exam and pertinent patient test results.  I agree with the assessment, diagnosis, and plan of care documented in the resident's note.

## 2023-12-25 NOTE — Telephone Encounter (Signed)
 I called Mr. Cobey to discuss her BMP results. Although her creatinine has improved from 2.13 to 1.91 mg/dL, I remain concerned about initiating her held medications at this time. I discussed the risks and benefits of resuming her medications versus waiting for kidney function to normalize. We agreed that she will continue to increase her fluid intake and return to the lab on Monday 12/28/2023, for a repeat BMP. If her creatinine returns to normal at that time, we will consider resuming Azor , [name] , and metformin .

## 2023-12-29 ENCOUNTER — Other Ambulatory Visit

## 2023-12-29 DIAGNOSIS — N1832 Chronic kidney disease, stage 3b: Secondary | ICD-10-CM

## 2023-12-30 LAB — BASIC METABOLIC PANEL WITH GFR
BUN/Creatinine Ratio: 13 (ref 12–28)
BUN: 23 mg/dL (ref 8–27)
CO2: 25 mmol/L (ref 20–29)
Calcium: 9.3 mg/dL (ref 8.7–10.3)
Chloride: 102 mmol/L (ref 96–106)
Creatinine, Ser: 1.71 mg/dL — ABNORMAL HIGH (ref 0.57–1.00)
Glucose: 284 mg/dL — ABNORMAL HIGH (ref 70–99)
Potassium: 4.7 mmol/L (ref 3.5–5.2)
Sodium: 143 mmol/L (ref 134–144)
eGFR: 29 mL/min/1.73 — ABNORMAL LOW (ref >59)

## 2024-01-03 ENCOUNTER — Ambulatory Visit: Payer: Self-pay | Admitting: Student

## 2024-02-03 ENCOUNTER — Telehealth: Payer: Self-pay | Admitting: *Deleted

## 2024-02-03 NOTE — Telephone Encounter (Signed)
 Dr. Kandis stated patient does not need to come for appointment on 02/04/24. Left VM that appointment had been cancelled and keep the appointment with Dr. Trudy in February 2026.

## 2024-02-04 ENCOUNTER — Ambulatory Visit: Payer: Self-pay | Admitting: Student

## 2024-03-03 ENCOUNTER — Ambulatory Visit: Admitting: Internal Medicine
